# Patient Record
Sex: Male | Born: 1955 | Race: White | Hispanic: No | Marital: Single | Smoking: Never smoker
Health system: Southern US, Community
[De-identification: ages and names within clinical notes are randomized; demographics above are authoritative.]

## PROBLEM LIST (undated history)

## (undated) DIAGNOSIS — N200 Calculus of kidney: Secondary | ICD-10-CM

## (undated) DIAGNOSIS — I10 Essential (primary) hypertension: Secondary | ICD-10-CM

## (undated) DIAGNOSIS — I712 Thoracic aortic aneurysm, without rupture: Secondary | ICD-10-CM

## (undated) DIAGNOSIS — R0600 Dyspnea, unspecified: Secondary | ICD-10-CM

## (undated) DIAGNOSIS — Z87442 Personal history of urinary calculi: Secondary | ICD-10-CM

## (undated) DIAGNOSIS — E785 Hyperlipidemia, unspecified: Secondary | ICD-10-CM

## (undated) DIAGNOSIS — I2510 Atherosclerotic heart disease of native coronary artery without angina pectoris: Secondary | ICD-10-CM

## (undated) DIAGNOSIS — G4730 Sleep apnea, unspecified: Secondary | ICD-10-CM

## (undated) DIAGNOSIS — R931 Abnormal findings on diagnostic imaging of heart and coronary circulation: Secondary | ICD-10-CM

## (undated) DIAGNOSIS — Z8719 Personal history of other diseases of the digestive system: Secondary | ICD-10-CM

## (undated) DIAGNOSIS — R001 Bradycardia, unspecified: Secondary | ICD-10-CM

## (undated) DIAGNOSIS — I7121 Aneurysm of the ascending aorta, without rupture: Secondary | ICD-10-CM

## (undated) HISTORY — DX: Bradycardia, unspecified: R00.1

## (undated) HISTORY — DX: Calculus of kidney: N20.0

## (undated) HISTORY — PX: PROSTATE BIOPSY: SHX241

## (undated) HISTORY — PX: UPPER GASTROINTESTINAL ENDOSCOPY: SHX188

## (undated) HISTORY — PX: COLONOSCOPY: SHX174

## (undated) HISTORY — PX: LAMINECTOMY: SHX219

## (undated) HISTORY — DX: Hyperlipidemia, unspecified: E78.5

## (undated) HISTORY — DX: Abnormal findings on diagnostic imaging of heart and coronary circulation: R93.1

## (undated) HISTORY — DX: Essential (primary) hypertension: I10

## (undated) HISTORY — PX: MOLE REMOVAL: SHX2046

## (undated) HISTORY — PX: TONSILLECTOMY AND ADENOIDECTOMY: SUR1326

## (undated) HISTORY — DX: Atherosclerotic heart disease of native coronary artery without angina pectoris: I25.10

---

## 2018-02-09 LAB — HM COLONOSCOPY

## 2018-05-14 DIAGNOSIS — E785 Hyperlipidemia, unspecified: Secondary | ICD-10-CM

## 2018-05-14 DIAGNOSIS — I10 Essential (primary) hypertension: Secondary | ICD-10-CM

## 2018-05-14 DIAGNOSIS — R001 Bradycardia, unspecified: Secondary | ICD-10-CM | POA: Insufficient documentation

## 2018-05-14 DIAGNOSIS — I119 Hypertensive heart disease without heart failure: Secondary | ICD-10-CM

## 2018-05-14 HISTORY — DX: Essential (primary) hypertension: I10

## 2018-05-14 HISTORY — DX: Hypertensive heart disease without heart failure: I11.9

## 2018-05-14 HISTORY — DX: Hyperlipidemia, unspecified: E78.5

## 2018-05-14 HISTORY — DX: Bradycardia, unspecified: R00.1

## 2018-05-19 ENCOUNTER — Ambulatory Visit: Payer: 59 | Admitting: Cardiology

## 2018-05-30 NOTE — Progress Notes (Signed)
Cardiology Office Note:    Date:  06/01/2018   ID:  Joe Arroyo, DOB 1956/03/07, MRN [id number]  PCP:  [name], MD  Cardiologist:  Shirlee More, MD   Referring MD: [name], MD  ASSESSMENT:    1. Essential hypertension   2. Mixed hyperlipidemia    PLAN:    In order of problems listed above:  1. His blood pressure is remaining above 269-485 systolic I am concerned with high dose Catapres as monotherapy after review of office records or allergies I will place him on an ARB telmisartan 20 mg daily continue to monitor home blood pressures follow-up in the office 3 to 4 weeks for recheck renal function and hopefully be able to down titrate his centrally active Catapres which long-term high dose can cause CNS side effects.  He does sodium restriction I have encouraged him to achieve moderate weight loss BMI greater than 25.  If diuretic is needed we could use distal diuretic spironolactone or Inspra after rechecking renal function 2. Stable continue Zetia  Next appointment   Medication Adjustments/Labs and Tests Ordered: Current medicines are reviewed at length with the patient today.  Concerns regarding medicines are outlined above.  Orders Placed This Encounter  Procedures  . EKG 12-Lead   Meds ordered this encounter  Medications  . telmisartan (MICARDIS) 20 MG tablet    Sig: Take 1 tablet (20 mg total) by mouth daily.    Dispense:  30 tablet    Refill:  3     No chief complaint on file.   History of Present Illness:    Joe Arroyo is a 63 y.o. male who is being seen today for the evaluation of poorly controlled hypertension at the request of Cox, Kirsten, MD.  He has been hypertension for about 4 years and presently is taking fairly high-dose Catapres as monotherapy for control despite this he often is greater than 462 systolic.  Fortunately he is not having CNS side effects.  He has sinus bradycardia and beta-blockers were ineffective or poorly tolerated had a  rash from thiazide diuretic.  I accessed his records and after discussion decided to place him on high intensity ARB reassess in the office and hopefully be able to reduce the dose of his clonidine and take it as a nighttime agent.  He does sodium restrict asked him to continue the same continue monitoring his BP at home encouraged weight loss which helps with blood pressure control.  His BMP is in the overweight category.  I discussed with him evaluation for secondary sources of hypertension told he had a CT scan access to from [facility] and he had no evidence of renal artery stenosis.  Recent labs at his PCP office show normal renal function and CBC Past Medical History:  Diagnosis Date  . Hyperlipidemia 05/14/2018  . Hypertension 05/14/2018  . Sinus bradycardia 05/14/2018    Past Surgical History:  Procedure Laterality Date  . LAMINECTOMY    . MOLE REMOVAL    . TONSILLECTOMY AND ADENOIDECTOMY      Current Medications: Current Meds  Medication Sig  . cloNIDine (CATAPRES) 0.3 MG tablet Take 0.3 mg by mouth 2 (two) times daily.  [name] esomeprazole (NEXIUM) 20 MG capsule Take 20 mg by mouth daily at 12 noon.  . ezetimibe (ZETIA) 10 MG tablet Take 10 mg by mouth daily.  . finasteride (PROSCAR) 5 MG tablet Take 1 tablet by mouth daily.  . Multiple Vitamins-Minerals (CENTRUM SILVER ULTRA MENS PO) Take 1  tablet by mouth daily.  . Omega-3 Fatty Acids (FISH OIL PO) Take 1 tablet by mouth 2 (two) times daily.     Allergies:   Bystolic [nebivolol hcl]; Carvedilol; Pravastatin; Hctz [hydrochlorothiazide]; Hydralazine; and Penicillins   Social History   Socioeconomic History  . Marital status: Single    Spouse name: Not on file  . Number of children: Not on file  . Years of education: Not on file  . Highest education level: Not on file  Occupational History  . Not on file  Social Needs  . Financial resource strain: Not on file  . Food insecurity:    Worry: Not on file    Inability:  Not on file  . Transportation needs:    Medical: Not on file    Non-medical: Not on file  Tobacco Use  . Smoking status: Never Smoker  . Smokeless tobacco: Never Used  Substance and Sexual Activity  . Alcohol use: Yes    Comment: occ  . Drug use: Never  . Sexual activity: Not on file  Lifestyle  . Physical activity:    Days per week: Not on file    Minutes per session: Not on file  . Stress: Not on file  Relationships  . Social connections:    Talks on phone: Not on file    Gets together: Not on file    Attends religious service: Not on file    Active member of club or organization: Not on file    Attends meetings of clubs or organizations: Not on file    Relationship status: Not on file  Other Topics Concern  . Not on file  Social History Narrative  . Not on file     Family History: The patient's family history includes Aortic stenosis in his mother; CAD in his maternal grandmother; Colon cancer in his mother; Congestive Heart Failure in his maternal grandmother; Diabetes Mellitus II in his father and mother; Heart attack in his maternal grandmother; Hyperlipidemia in his father and mother; Valvular heart disease in his mother.  ROS:   Review of Systems  Constitution: Negative.  HENT: Negative.   Eyes: Negative.   Cardiovascular: Negative.   Respiratory: Negative.   Endocrine: Negative.   Hematologic/Lymphatic: Negative.   Skin: Negative.   Musculoskeletal: Negative.   Gastrointestinal: Negative.   Genitourinary: Negative.   Neurological: Negative.   Psychiatric/Behavioral: Negative.   Allergic/Immunologic: Negative.    Please see the history of present illness.     All other systems reviewed and are negative.  EKGs/Labs/Other Studies Reviewed:    The following studies were reviewed today:  EKG:  EKG is  ordered today.  The ekg ordered today demonstrates Atrial bigeminy OW normal  Recent Labs: No results found for requested labs within last 8760 hours.    Recent Lipid Panel No results found for: CHOL, TRIG, HDL, CHOLHDL, VLDL, LDLCALC, LDLDIRECT  Physical Exam:    VS:  BP (!) 162/92 (BP Location: Right Arm, Patient Position: Sitting, Cuff Size: Normal)   Pulse 64   Ht 6' (1.829 m)   Wt 192 lb 3.2 oz (87.2 kg)   SpO2 97%   BMI 26.07 kg/m     Wt Readings from Last 3 Encounters:  06/01/18 192 lb 3.2 oz (87.2 kg)     GEN:  Well nourished, well developed in no acute distress HEENT: Normal NECK: No JVD; No carotid bruits LYMPHATICS: No lymphadenopathy CARDIAC: RRR, no murmurs, rubs, gallops RESPIRATORY:  Clear to auscultation without rales,  wheezing or rhonchi  ABDOMEN: Soft, non-tender, non-distended MUSCULOSKELETAL:  No edema; No deformity  SKIN: Warm and dry NEUROLOGIC:  Alert and oriented x 3 PSYCHIATRIC:  Normal affect     Signed, Shirlee More, MD  06/01/2018 3:57 PM    [facility]

## 2018-06-01 ENCOUNTER — Ambulatory Visit (INDEPENDENT_AMBULATORY_CARE_PROVIDER_SITE_OTHER): Payer: 59 | Admitting: Cardiology

## 2018-06-01 ENCOUNTER — Encounter: Payer: Self-pay | Admitting: Cardiology

## 2018-06-01 VITALS — BP 162/92 | HR 64 | Ht 72.0 in | Wt 192.2 lb

## 2018-06-01 DIAGNOSIS — I10 Essential (primary) hypertension: Secondary | ICD-10-CM

## 2018-06-01 DIAGNOSIS — E782 Mixed hyperlipidemia: Secondary | ICD-10-CM | POA: Diagnosis not present

## 2018-06-01 MED ORDER — TELMISARTAN 20 MG PO TABS: 20.0000 mg | ORAL_TABLET | Freq: Every day | ORAL | 3 refills | Status: DC

## 2018-06-01 NOTE — Patient Instructions (Addendum)
Medication Instructions:  Your physician has recommended you make the following change in your medication: START  Taking telmisartan (Micardis) 20 mg (1 tablet) daily  If you need a refill on your cardiac medications before your next appointment, please call your pharmacy.   Lab work: NONE  Testing/Procedures: An EKG was performed today.  Follow-Up: At [facility], you and your health needs are our priority.  As part of our continuing mission to provide you with exceptional heart care, we have created designated Provider Care Teams.  These Care Teams include your primary Cardiologist (physician) and Advanced Practice Providers (APPs -  Physician Assistants and Nurse Practitioners) who all work together to provide you with the care you need, when you need it. You will need a follow up appointment in 3 weeks.   Any Other Special Instructions Will Be Listed Below    Hypertension Hypertension, commonly called high blood pressure, is when the force of blood pumping through the arteries is too strong. The arteries are the blood vessels that carry blood from the heart throughout the body. Hypertension forces the heart to work harder to pump blood and may cause arteries to become narrow or stiff. Having untreated or uncontrolled hypertension can cause heart attacks, strokes, kidney disease, and other problems. A blood pressure reading consists of a higher number over a lower number. Ideally, your blood pressure should be below 120/80. The first ("top") number is called the systolic pressure. It is a measure of the pressure in your arteries as your heart beats. The second ("bottom") number is called the diastolic pressure. It is a measure of the pressure in your arteries as the heart relaxes. What are the causes? The cause of this condition is not known. What increases the risk? Some risk factors for high blood pressure are under your control. Others are not. Factors you can  change  Smoking.  Having type 2 diabetes mellitus, high cholesterol, or both.  Not getting enough exercise or physical activity.  Being overweight.  Having too much fat, sugar, calories, or salt (sodium) in your diet.  Drinking too much alcohol. Factors that are difficult or impossible to change  Having chronic kidney disease.  Having a family history of high blood pressure.  Age. Risk increases with age.  Race. You may be at higher risk if you are African-American.  Gender. Men are at higher risk than women before age 72. After age 77, women are at higher risk than men.  Having obstructive sleep apnea.  Stress. What are the signs or symptoms? Extremely high blood pressure (hypertensive crisis) may cause:  Headache.  Anxiety.  Shortness of breath.  Nosebleed.  Nausea and vomiting.  Severe chest pain.  Jerky movements you cannot control (seizures). How is this diagnosed? This condition is diagnosed by measuring your blood pressure while you are seated, with your arm resting on a surface. The cuff of the blood pressure monitor will be placed directly against the skin of your upper arm at the level of your heart. It should be measured at least twice using the same arm. Certain conditions can cause a difference in blood pressure between your right and left arms. Certain factors can cause blood pressure readings to be lower or higher than normal (elevated) for a short period of time:  When your blood pressure is higher when you are in a health care provider's office than when you are at home, this is called white coat hypertension. Most people with this condition do not need  medicines.  When your blood pressure is higher at home than when you are in a health care provider's office, this is called masked hypertension. Most people with this condition may need medicines to control blood pressure. If you have a high blood pressure reading during one visit or you have normal  blood pressure with other risk factors:  You may be asked to return on a different day to have your blood pressure checked again.  You may be asked to monitor your blood pressure at home for 1 week or longer. If you are diagnosed with hypertension, you may have other blood or imaging tests to help your health care provider understand your overall risk for other conditions. How is this treated? This condition is treated by making healthy lifestyle changes, such as eating healthy foods, exercising more, and reducing your alcohol intake. Your health care provider may prescribe medicine if lifestyle changes are not enough to get your blood pressure under control, and if:  Your systolic blood pressure is above 130.  Your diastolic blood pressure is above 80. Your personal target blood pressure may vary depending on your medical conditions, your age, and other factors. Follow these instructions at home: Eating and drinking   Eat a diet that is high in fiber and potassium, and low in sodium, added sugar, and fat. An example eating plan is called the DASH (Dietary Approaches to Stop Hypertension) diet. To eat this way: ? Eat plenty of fresh fruits and vegetables. Try to fill half of your plate at each meal with fruits and vegetables. ? Eat whole grains, such as whole wheat pasta, brown rice, or whole grain bread. Fill about one quarter of your plate with whole grains. ? Eat or drink low-fat dairy products, such as skim milk or low-fat yogurt. ? Avoid fatty cuts of meat, processed or cured meats, and poultry with skin. Fill about one quarter of your plate with lean proteins, such as fish, chicken without skin, beans, eggs, and tofu. ? Avoid premade and processed foods. These tend to be higher in sodium, added sugar, and fat.  Reduce your daily sodium intake. Most people with hypertension should eat less than 1,500 mg of sodium a day.  Limit alcohol intake to no more than 1 drink a day for  nonpregnant women and 2 drinks a day for men. One drink equals 12 oz of beer, 5 oz of wine, or 1 oz of hard liquor. Lifestyle   Work with your health care provider to maintain a healthy body weight or to lose weight. Ask what an ideal weight is for you.  Get at least 30 minutes of exercise that causes your heart to beat faster (aerobic exercise) most days of the week. Activities may include walking, swimming, or biking.  Include exercise to strengthen your muscles (resistance exercise), such as pilates or lifting weights, as part of your weekly exercise routine. Try to do these types of exercises for 30 minutes at least 3 days a week.  Do not use any products that contain nicotine or tobacco, such as cigarettes and e-cigarettes. If you need help quitting, ask your health care provider.  Monitor your blood pressure at home as told by your health care provider.  Keep all follow-up visits as told by your health care provider. This is important. Medicines  Take over-the-counter and prescription medicines only as told by your health care provider. Follow directions carefully. Blood pressure medicines must be taken as prescribed.  Do not skip doses of  blood pressure medicine. Doing this puts you at risk for problems and can make the medicine less effective.  Ask your health care provider about side effects or reactions to medicines that you should watch for. Contact a health care provider if:  You think you are having a reaction to a medicine you are taking.  You have headaches that keep coming back (recurring).  You feel dizzy.  You have swelling in your ankles.  You have trouble with your vision. Get help right away if:  You develop a severe headache or confusion.  You have unusual weakness or numbness.  You feel faint.  You have severe pain in your chest or abdomen.  You vomit repeatedly.  You have trouble breathing. Summary  Hypertension is when the force of blood  pumping through your arteries is too strong. If this condition is not controlled, it may put you at risk for serious complications.  Your personal target blood pressure may vary depending on your medical conditions, your age, and other factors. For most people, a normal blood pressure is less than 120/80.  Hypertension is treated with lifestyle changes, medicines, or a combination of both. Lifestyle changes include weight loss, eating a healthy, low-sodium diet, exercising more, and limiting alcohol. This information is not intended to replace advice given to you by your health care provider. Make sure you discuss any questions you have with your health care provider. Document Released: 03/31/2005 Document Revised: 02/27/2016 Document Reviewed: 02/27/2016 Elsevier Interactive Patient Education  2019 Else[employer] Inc[employer]  Telmisartan tablets What is this medicine? TELMISARTAN (tel mi SAR tan) is used to treat high blood pressure. This medicine may be used for other purposes; ask your health care provider or pharmacist if you have questions. COMMON BRAND NAME(S): Micardis What should I tell my health care provider before I take this medicine? They need to know if you have any of these conditions: -if you are on a special diet, such as a low-salt diet -kidney or liver disease -an unusual or allergic reaction to telmisartan, other medicines, foods, dyes, or preservatives -pregnant or trying to get pregnant -breast-feeding How should I use this medicine? Take this medicine by mouth with a glass of water. Follow the directions on the prescription label. This medicine can be taken with or without food. Take your doses at regular intervals. Do not take your medicine more often than directed. Talk to your pediatrician regarding the use of this medicine in children. Special care may be needed. Overdosage: If you think you have taken too much of this medicine contact a poison control center or emergency room  at once. NOTE: This medicine is only for you. Do not share this medicine with others. What if I miss a dose? If you miss a dose, take it as soon as you can. If it is almost time for your next dose, take only that dose. Do not take double or extra doses. What may interact with this medicine? -digoxin -potassium salts or potassium supplements -warfarin This list may not describe all possible interactions. Give your health care provider a list of all the medicines, herbs, non-prescription drugs, or dietary supplements you use. Also tell them if you smoke, drink alcohol, or use illegal drugs. Some items may interact with your medicine. What should I watch for while using this medicine? Visit your doctor or health care professional for regular checks on your progress. Check your blood pressure as directed. Ask your doctor or health care professional what your blood pressure  should be and when you should contact him or her. Call your doctor or health care professional if you notice an irregular or fast heart beat. Women should inform their doctor if they wish to become pregnant or think they might be pregnant. There is a potential for serious side effects to an unborn child, particularly in the second or third trimester. Talk to your health care professional or pharmacist for more information. You may get drowsy or dizzy. Do not drive, use machinery, or do anything that needs mental alertness until you know how this drug affects you. Do not stand or sit up quickly, especially if you are an older patient. This reduces the risk of dizzy or fainting spells. Alcohol can make you more drowsy and dizzy. Avoid alcoholic drinks. Avoid salt substitutes unless you are told otherwise by your doctor or health care professional. Do not treat yourself for coughs, colds, or pain while you are taking this medicine without asking your doctor or health care professional for advice. Some ingredients may increase your blood  pressure. What side effects may I notice from receiving this medicine? Side effects that you should report to your doctor or health care professional as soon as possible: -allergic reactions like skin rash, itching or hives, swelling of the face, lips, or tongue -breathing problems -dark urine -gout pain -muscle pains -slow heartbeat -trouble passing urine or change in the amount of urine -unusual bleeding or bruising -yellowing of the eyes or skin Side effects that usually do not require medical attention (report to your doctor or health care professional if they continue or are bothersome): -back pain -change in sex drive or performance -diarrhea -sore throat or stuffy nose This list may not describe all possible side effects. Call your doctor for medical advice about side effects. You may report side effects to FDA at 1-800-FDA-1088. Where should I keep my medicine? Keep out of the reach of children. Store at room temperature between 15 and 30 degrees C (59 and 86 degrees F). Tablets should not be removed from the blisters until right before use. Throw away any unused medicine after the expiration date. NOTE: This sheet is a summary. It may not cover all possible information. If you have questions about this medicine, talk to your doctor, pharmacist, or health care provider.  2019 Elsevier/Gold Standard (2007-06-16 13:39:10)

## 2018-06-03 ENCOUNTER — Other Ambulatory Visit: Payer: Self-pay

## 2018-06-03 DIAGNOSIS — R001 Bradycardia, unspecified: Secondary | ICD-10-CM

## 2018-06-24 ENCOUNTER — Ambulatory Visit: Payer: 59 | Admitting: Cardiology

## 2018-07-06 ENCOUNTER — Telehealth (INDEPENDENT_AMBULATORY_CARE_PROVIDER_SITE_OTHER): Payer: 59 | Admitting: Cardiology

## 2018-07-06 ENCOUNTER — Encounter: Payer: Self-pay | Admitting: Cardiology

## 2018-07-06 ENCOUNTER — Ambulatory Visit: Payer: 59 | Admitting: Cardiology

## 2018-07-06 ENCOUNTER — Other Ambulatory Visit: Payer: Self-pay

## 2018-07-06 VITALS — BP 142/93 | HR 68 | Ht 72.0 in | Wt 189.0 lb

## 2018-07-06 DIAGNOSIS — E782 Mixed hyperlipidemia: Secondary | ICD-10-CM

## 2018-07-06 DIAGNOSIS — I10 Essential (primary) hypertension: Secondary | ICD-10-CM | POA: Diagnosis not present

## 2018-07-06 MED ORDER — EPLERENONE 25 MG PO TABS: 25.0000 mg | ORAL_TABLET | Freq: Every day | ORAL | 1 refills | Status: DC

## 2018-07-06 NOTE — Progress Notes (Signed)
Tele-Health Visit     Evaluation Performed:  Follow-up visit  This visit type was conducted due to national recommendations for restrictions regarding the COVID-19 Pandemic (e.g. social distancing).  This format is felt to be most appropriate for this patient at this time.  All issues noted in this document were discussed and addressed.  No physical exam was performed (except for noted visual exam findings with Telehealth visits).  See MyChart message from today for the patient's consent to telehealth for [facility].  Date:  07/06/2018   ID:  Joe Arroyo, DOB Feb 10, 1956, MRN [id number]  Patient Location:  [address] [place] [address]   Provider location:   [place]  PCP:  [name], MD  Cardiologist:  No primary care provider on file. Dr Bettina Gavia Electrophysiologist:  None   Chief Complaint: Blood pressure control change in medication  History of Present Illness:    Joe Arroyo is a 63 y.o. male who presents via audio/video conferencing for a telehealth visit today.  He was last seen in the office last month with poorly controlled hypertension was started on ARB.  Blood pressures remained elevated his PCP doubled the dose and he continues bedtime Catapres.  Home blood pressures are improved but diastolics are often elevated at 57-846 systolics mostly clustered less than 140.  He has had no rash cough lightheadedness chest pain palpitation or syncope tolerates his medications and is very compliant with sodium restriction and home blood pressure measurement.  He has had no recent lab work performed since the last visit.  He has multiple drug intolerances and allergies and to optimize blood pressure control will start a diuretic he is intolerant of thiazide with allergy will place him on low-dose MRA Inspra 25 mg daily and make arrangements for outpatient BMP in 1 week.  Continue to monitor home blood pressures and I will see back in a tele-visit in 6 weeks.   Hyperlipidemia stable continue his current treatment  The patient does not symptoms concerning for COVID-19 infection (fever, chills, cough, or new SHORTNESS OF BREATH).    Prior CV studies:   The following studies were reviewed today:  Previous office notes reviewed he had normal renal function.  Past Medical History:  Diagnosis Date  . Hyperlipidemia 05/14/2018  . Hypertension 05/14/2018  . Sinus bradycardia 05/14/2018   Past Surgical History:  Procedure Laterality Date  . LAMINECTOMY    . MOLE REMOVAL    . TONSILLECTOMY AND ADENOIDECTOMY       Current Meds  Medication Sig  . cloNIDine (CATAPRES) 0.3 MG tablet Take 0.3 mg by mouth daily.   [name] esomeprazole (NEXIUM) 20 MG capsule Take 20 mg by mouth daily at 12 noon.  . ezetimibe (ZETIA) 10 MG tablet Take 10 mg by mouth daily.  . finasteride (PROSCAR) 5 MG tablet Take 1 tablet by mouth daily.  . Multiple Vitamins-Minerals (CENTRUM SILVER ULTRA MENS PO) Take 1 tablet by mouth daily.  . Omega-3 Fatty Acids (FISH OIL PO) Take 1 tablet by mouth 2 (two) times daily.  [name] telmisartan (MICARDIS) 20 MG tablet Take 40 mg by mouth daily.     Allergies:   Bystolic [nebivolol hcl]; Carvedilol; Pravastatin; Hctz [hydrochlorothiazide]; Hydralazine; and Penicillins   Social History   Tobacco Use  . Smoking status: Never Smoker  . Smokeless tobacco: Never Used  Substance Use Topics  . Alcohol use: Yes    Comment: occ  . Drug use: Never     Family  Hx: The patient's family history includes Aortic stenosis in his mother; CAD in his maternal grandmother; Colon cancer in his mother; Congestive Heart Failure in his maternal grandmother; Diabetes Mellitus II in his father and mother; Heart attack in his maternal grandmother; Hyperlipidemia in his father and mother; Valvular heart disease in his mother.  ROS:   Please see the history of present illness.     All other systems reviewed and are negative.   Labs/Other Tests and Data Reviewed:     Recent Labs: No results found for requested labs within last 8760 hours.   Recent Lipid Panel No results found for: CHOL, TRIG, HDL, CHOLHDL, LDLCALC, LDLDIRECT  Wt Readings from Last 3 Encounters:  07/06/18 189 lb (85.7 kg)  06/01/18 192 lb 3.2 oz (87.2 kg)     Exam:    Vital Signs:  BP (!) 142/93 (BP Location: Left Arm, Patient Position: Sitting, Cuff Size: Normal)   Pulse 68   Ht 6' (1.829 m)   Wt 189 lb (85.7 kg)   BMI 25.63 kg/m   There was no video link for this visit  ASSESSMENT & PLAN:    1.  Hypertension essential improved but still above target continue bedtime clonidine morning ARB and start distal diuretic home blood pressure checks and follow-up BMP 1 week if continue sodium restriction home blood pressure monitoring contact us if systolics do not achieve target or if he is symptomatic hypotension. 2.  Hyperlipidemia stable continue current treatment Zetia fish oil managed by his PCP  COVID-19 Education: The signs and symptoms of COVID-19 were discussed with the patient and how to seek care for testing (follow up with PCP or arrange E-visit).  The importance of social distancing was discussed today.  Patient Risk:   After full review of this patients clinical status, I feel that they are at least moderate risk at this time.  Time:   Today, I have spent 17 minutes with the patient with telehealth technology discussing patient's hypertension current treatment drug intolerances and crafting a plan to optimize blood pressure control and follow-up recheck renal function to be sure he does not develop hyperkalemia with a combination MRA and ARB.     Medication Adjustments/Labs and Tests Ordered: Current medicines are reviewed at length with the patient today.  Concerns regarding medicines are outlined above.  Tests Ordered: Orders Placed This Encounter  Procedures  . Basic metabolic panel   Medication Changes: Meds ordered this encounter  Medications  .  eplerenone (INSPRA) 25 MG tablet    Sig: Take 1 tablet (25 mg total) by mouth daily.    Dispense:  90 tablet    Refill:  1    Disposition:  in 6 week(s)  Signed, Shirlee More, MD  07/06/2018 9:56 AM    [facility] [place], [place], [place]  [address] Phone: [phone]; Fax: [phone]

## 2018-07-06 NOTE — Patient Instructions (Signed)
Medication Instructions:  Your physician has recommended you make the following change in your medication: .  START:  Inspra 25 mg one tablet daily  If you need a refill on your cardiac medications before your next appointment, please call your pharmacy.   Lab work: Your physician recommends that you return for lab work in 1 week: BMP in [place] office.   If you have labs (blood work) drawn today and your tests are completely normal, you will receive your results only by: [name] MyChart Message (if you have MyChart) OR . A paper copy in the mail If you have any lab test that is abnormal or we need to change your treatment, we will call you to review the results.  Testing/Procedures: NONE  Follow-Up: At [facility], you and your health needs are our priority.  As part of our continuing mission to provide you with exceptional heart care, we have created designated Provider Care Teams.  These Care Teams include your primary Cardiologist (physician) and Advanced Practice Providers (APPs -  Physician Assistants and Nurse Practitioners) who all work together to provide you with the care you need, when you need it. You will need a follow up appointment in 6 weeks as a televisit.

## 2018-07-16 ENCOUNTER — Other Ambulatory Visit: Payer: Self-pay | Admitting: Cardiology

## 2018-07-16 NOTE — Telephone Encounter (Signed)
Spoke with pt to check on him about medication. He said Dr. Tobie Poet had put him on the 40 mg of Telmisartan and Dr. Bettina Gavia is okay with that. Pt states is doing well and has virtual visit in May with Dr. Bettina Gavia

## 2018-08-16 NOTE — Progress Notes (Signed)
Virtual Visit via Video Note   This visit type was conducted due to national recommendations for restrictions regarding the COVID-19 Pandemic (e.g. social distancing) in an effort to limit this patient's exposure and mitigate transmission in our community.  Due to his co-morbid illnesses, this patient is at least at moderate risk for complications without adequate follow up.  This format is felt to be most appropriate for this patient at this time.  All issues noted in this document were discussed and addressed.  A limited physical exam was performed with this format.  Please refer to the patient's chart for his consent to telehealth for [facility].   Date:  08/16/2018   ID:  Joe Arroyo, DOB 06-05-1955, MRN [id number]  Patient Location: Home Provider Location: Office  PCP:  [name], MD  Cardiologist:  No primary care provider on file. 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  The patient does not have symptoms concerning for COVID-19 infection (fever, chills, cough, or new shortness of breath).    Past Medical History:  Diagnosis Date  . Hyperlipidemia 05/14/2018  . Hypertension 05/14/2018  . Sinus bradycardia 05/14/2018   Past Surgical History:  Procedure Laterality Date  . LAMINECTOMY    . MOLE REMOVAL    . TONSILLECTOMY AND ADENOIDECTOMY       No outpatient medications have been marked as taking for the 08/17/18 encounter (Appointment) with Richardo Priest, MD.     Allergies:   Bystolic [nebivolol hcl]; Carvedilol; Pravastatin; Hctz [hydrochlorothiazide]; Hydralazine; and Penicillins   Social History   Tobacco Use  . Smoking status: Never Smoker  . Smokeless tobacco: Never Used  Substance Use Topics  . Alcohol use: Yes    Comment: occ  . Drug use: Never     Family Hx: The  patient's family history includes Aortic stenosis in his mother; CAD in his maternal grandmother; Colon cancer in his mother; Congestive Heart Failure in his maternal grandmother; Diabetes Mellitus II in his father and mother; Heart attack in his maternal grandmother; Hyperlipidemia in his father and mother; Valvular heart disease in his mother.  ROS:   Please see the history of present illness.     All other systems reviewed and are negative.   Prior CV studies:   The following studies were reviewed today:    Labs/Other Tests and Data Reviewed:    EKG:  No ECG reviewed.  Recent Labs: No results found for requested labs within last 8760 hours.   Recent Lipid Panel No results found for: CHOL, TRIG, HDL, CHOLHDL, LDLCALC, LDLDIRECT  Wt Readings from Last 3 Encounters:  07/06/18 189 lb (85.7 kg)  06/01/18 192 lb 3.2 oz (87.2 kg)     Objective:    Vital Signs:  There were no vitals taken for this visit.   VITAL SIGNS:  reviewed GEN:  no acute distress EYES:  sclerae anicteric, EOMI - Extraocular Movements Intact RESPIRATORY:  normal respiratory effort, symmetric expansion CARDIOVASCULAR:  no peripheral edema SKIN:  no rash, lesions or ulcers. MUSCULOSKELETAL:  no obvious deformities. NEURO:  alert and oriented x 3, no obvious focal deficit PSYCH:  normal affect  ASSESSMENT & PLAN:    1. Hypertension resistant, he is at a very nice response to the initiation of distal diuretic home blood pressures in the  morning run 1 59-5 30 systolic in the evening from 1 63-8 40 systolic.  He is compliant with medications restrict sodium and has good technique for measuring home blood pressure.  I would continue his current medical regimen including centrally active clonidine at bedtime distal diuretic Inspra and a high potency ARB telmisartan.  I will plan to see in the office in 3 months he will contact me if his blood pressures are out of range.  Recent labs done by his primary care  physician 06/22/2018 showed GFR 64 cc creatinine 1.19 potassium 4.6 lipid profile has a cholesterol of 242 HDL 63 LDL 153. 2. Hypertension continue current treatment with Zetia he has not tolerated statins in the absence of vascular disease I would not initiate PCSK9  COVID-19 Education: The signs and symptoms of COVID-19 were discussed with the patient and how to seek care for testing (follow up with PCP or arrange E-visit).  The importance of social distancing was discussed today.  Time:   Today, I have spent 25 minutes with the patient with telehealth technology discussing the above problems.     Medication Adjustments/Labs and Tests Ordered: Current medicines are reviewed at length with the patient today.  Concerns regarding medicines are outlined above.   Tests Ordered: No orders of the defined types were placed in this encounter.   Medication Changes: No orders of the defined types were placed in this encounter.   Disposition:  Follow up in 3 month(s)  Signed, Shirlee More, MD  08/16/2018 12:36 PM    [place]

## 2018-08-17 ENCOUNTER — Telehealth (INDEPENDENT_AMBULATORY_CARE_PROVIDER_SITE_OTHER): Payer: 59 | Admitting: Cardiology

## 2018-08-17 ENCOUNTER — Telehealth: Payer: Self-pay | Admitting: Cardiology

## 2018-08-17 ENCOUNTER — Other Ambulatory Visit: Payer: Self-pay

## 2018-08-17 ENCOUNTER — Encounter: Payer: Self-pay | Admitting: Cardiology

## 2018-08-17 VITALS — BP 113/73 | HR 68 | Ht 72.0 in | Wt 187.0 lb

## 2018-08-17 DIAGNOSIS — I10 Essential (primary) hypertension: Secondary | ICD-10-CM

## 2018-08-17 DIAGNOSIS — Z7189 Other specified counseling: Secondary | ICD-10-CM

## 2018-08-17 DIAGNOSIS — E782 Mixed hyperlipidemia: Secondary | ICD-10-CM

## 2018-08-17 MED ORDER — EPLERENONE 25 MG PO TABS: 25.0000 mg | ORAL_TABLET | Freq: Every day | ORAL | 1 refills | Status: DC

## 2018-08-17 MED ORDER — CLONIDINE HCL 0.3 MG PO TABS: 0.3000 mg | ORAL_TABLET | Freq: Every day | ORAL | 1 refills | Status: DC

## 2018-08-17 NOTE — Telephone Encounter (Signed)
Virtual Visit Pre-Appointment Phone Call  "(Name), I am calling you today to discuss your upcoming appointment. We are currently trying to limit exposure to the virus that causes COVID-19 by seeing patients at home rather than in the office."  1. "What is the BEST phone number to call the day of the visit?" - include this in appointment notes  2. Do you have or have access to (through a family member/friend) a smartphone with video capability that we can use for your visit?" a. If yes - list this number in appt notes as cell (if different from BEST phone #) and list the appointment type as a VIDEO visit in appointment notes b. If no - list the appointment type as a PHONE visit in appointment notes  3. Confirm consent - "In the setting of the current Covid19 crisis, you are scheduled for a (phone or video) visit with your provider on (date) at (time).  Just as we do with many in-office visits, in order for you to participate in this visit, we must obtain consent.  If you'd like, I can send this to your mychart (if signed up) or email for you to review.  Otherwise, I can obtain your verbal consent now.  All virtual visits are billed to your insurance company just like a normal visit would be.  By agreeing to a virtual visit, we'd like you to understand that the technology does not allow for your provider to perform an examination, and thus may limit your provider's ability to fully assess your condition. If your provider identifies any concerns that need to be evaluated in person, we will make arrangements to do so.  Finally, though the technology is pretty good, we cannot assure that it will always work on either your or our end, and in the setting of a video visit, we may have to convert it to a phone-only visit.  In either situation, we cannot ensure that we have a secure connection.  Are you willing to proceed?" STAFF: Did the patient verbally acknowledge consent to telehealth visit? Document  YES/NO here: Yes  4. Advise patient to be prepared - "Two hours prior to your appointment, go ahead and check your blood pressure, pulse, oxygen saturation, and your weight (if you have the equipment to check those) and write them all down. When your visit starts, your provider will ask you for this information. If you have an Apple Watch or Kardia device, please plan to have heart rate information ready on the day of your appointment. Please have a pen and paper handy nearby the day of the visit as well."  5. Give patient instructions for MyChart download to smartphone OR Doximity/Doxy.me as below if video visit (depending on what platform provider is using)  6. Inform patient they will receive a phone call 15 minutes prior to their appointment time (may be from unknown caller ID) so they should be prepared to answer    TELEPHONE CALL NOTE  Joe Arroyo has been deemed a candidate for a follow-up tele-health visit to limit community exposure during the Covid-19 pandemic. I spoke with the patient via phone to ensure availability of phone/video source, confirm preferred email & phone number, and discuss instructions and expectations.  I reminded Joe Arroyo to be prepared with any vital sign and/or heart rhythm information that could potentially be obtained via home monitoring, at the time of his visit. I reminded Joe Arroyo to expect a phone call prior to his visit.  Calla Kicks 08/17/2018 8:31 AM   INSTRUCTIONS FOR DOWNLOADING THE MYCHART APP TO SMARTPHONE  - The patient must first make sure to have activated MyChart and know their login information - If Apple, go to [employer] and type in MyChart in the search bar and download the app. If Android, ask patient to go to [employer] and type in [place] in the search bar and download the app. The app is free but as with any other app downloads, their phone may require them to verify saved payment information or Apple/Android password.   - The patient will need to then log into the app with their MyChart username and password, and select [facility] as their healthcare provider to link the account. When it is time for your visit, go to the MyChart app, find appointments, and click Begin Video Visit. Be sure to Select Allow for your device to access the Microphone and Camera for your visit. You will then be connected, and your provider will be with you shortly.  **If they have any issues connecting, or need assistance please contact MyChart service desk [phone])**  **If using a computer, in order to ensure the best quality for their visit they will need to use either of the following Internet Browsers: [employer], or Google Chrome**  IF USING DOXIMITY or DOXY.ME - The patient will receive a link just prior to their visit by text.     FULL LENGTH CONSENT FOR TELE-HEALTH VISIT   I hereby voluntarily request, consent and authorize [facility] and its employed or contracted physicians, physician assistants, nurse practitioners or other licensed health care professionals (the Practitioner), to provide me with telemedicine health care services (the Services") as deemed necessary by the treating Practitioner. I acknowledge and consent to receive the Services by the Practitioner via telemedicine. I understand that the telemedicine visit will involve communicating with the Practitioner through live audiovisual communication technology and the disclosure of certain medical information by electronic transmission. I acknowledge that I have been given the opportunity to request an in-person assessment or other available alternative prior to the telemedicine visit and am voluntarily participating in the telemedicine visit.  I understand that I have the right to withhold or withdraw my consent to the use of telemedicine in the course of my care at any time, without affecting my right to future care or treatment, and that  the Practitioner or I may terminate the telemedicine visit at any time. I understand that I have the right to inspect all information obtained and/or recorded in the course of the telemedicine visit and may receive copies of available information for a reasonable fee.  I understand that some of the potential risks of receiving the Services via telemedicine include:   Delay or interruption in medical evaluation due to technological equipment failure or disruption;  Information transmitted may not be sufficient (e.g. poor resolution of images) to allow for appropriate medical decision making by the Practitioner; and/or   In rare instances, security protocols could fail, causing a breach of personal health information.  Furthermore, I acknowledge that it is my responsibility to provide information about my medical history, conditions and care that is complete and accurate to the best of my ability. I acknowledge that Practitioner's advice, recommendations, and/or decision may be based on factors not within their control, such as incomplete or inaccurate data provided by me or distortions of diagnostic images or specimens that may result from electronic transmissions. I understand that the  practice of medicine is not an Chief Strategy Officer and that Practitioner makes no warranties or guarantees regarding treatment outcomes. I acknowledge that I will receive a copy of this consent concurrently upon execution via email to the email address I last provided but may also request a printed copy by calling the office of [place].    I understand that my insurance will be billed for this visit.   I have read or had this consent read to me.  I understand the contents of this consent, which adequately explains the benefits and risks of the Services being provided via telemedicine.   I have been provided ample opportunity to ask questions regarding this consent and the Services and have had my questions answered to  my satisfaction.  I give my informed consent for the services to be provided through the use of telemedicine in my medical care  By participating in this telemedicine visit I agree to the above.

## 2018-08-17 NOTE — Patient Instructions (Addendum)
Medication Instructions:  Your physician recommends that you continue on your current medications as directed. Please refer to the Current Medication list given to you today.  If you need a refill on your cardiac medications before your next appointment, please call your pharmacy.   Lab work: None If you have labs (blood work) drawn today and your tests are completely normal, you will receive your results only by: [name] MyChart Message (if you have MyChart) OR . A paper copy in the mail If you have any lab test that is abnormal or we need to change your treatment, we will call you to review the results.  Testing/Procedures: None  Follow-Up: At [facility], you and your health needs are our priority.  As part of our continuing mission to provide you with exceptional heart care, we have created designated Provider Care Teams.  These Care Teams include your primary Cardiologist (physician) and Advanced Practice Providers (APPs -  Physician Assistants and Nurse Practitioners) who all work together to provide you with the care you need, when you need it. You will need a follow up appointment in 2 months.  Any Other Special Instructions Will Be Listed Below (If Applicable).    [facility]  Tips to measure your blood pressure correctly  To determine whether you have hypertension, a medical professional will take a blood pressure reading. How you prepare for the test, the position of your arm, and other factors can change a blood pressure reading by 10% or more. That could be enough to hide high blood pressure, start you on a drug you don't really need, or lead your doctor to incorrectly adjust your medications. National and international guidelines offer specific instructions for measuring blood pressure. If a doctor, nurse, or medical assistant isn't doing it right, don't hesitate to ask him or her to get with the guidelines. Here's  what you can do to ensure a correct reading: . Don't drink a caffeinated beverage or smoke during the 30 minutes before the test. . Sit quietly for five minutes before the test begins. . During the measurement, sit in a chair with your feet on the floor and your arm supported so your elbow is at about heart level. . The inflatable part of the cuff should completely cover at least 80% of your upper arm, and the cuff should be placed on bare skin, not over a shirt. . Don't talk during the measurement. . Have your blood pressure measured twice, with a brief break in between. If the readings are different by 5 points or more, have it done a third time. There are times to break these rules. If you sometimes feel lightheaded when getting out of bed in the morning or when you stand after sitting, you should have your blood pressure checked while seated and then while standing to see if it falls from one position to the next. Because blood pressure varies throughout the day, your doctor will rarely diagnose hypertension on the basis of a single reading. Instead, he or she will want to confirm the measurements on at least two occasions, usually within a few weeks of one another. The exception to this rule is if you have a blood pressure reading of 180/110 mm Hg or higher. A result this high usually calls for prompt treatment. It's also a good  idea to have your blood pressure measured in both arms at least once, since the reading in one arm (usually the right) may be higher than that in the left. A 2014 study in The American Journal of Medicine of nearly 3,400 people found average arm- to-arm differences in systolic blood pressure of about 5 points. The higher number should be used to make treatment decisions. In 2017, new guidelines from the [place], the [employer] of Cardiology, and nine other health organizations lowered the diagnosis of high blood pressure to 130/80 mm Hg or higher for  all adults. The guidelines also redefined the various blood pressure categories to now include normal, elevated, Stage 1 hypertension, Stage 2 hypertension, and hypertensive crisis (see "Blood pressure categories"). Blood pressure categories  Blood pressure category SYSTOLIC (upper number)  DIASTOLIC (lower number)  Normal Less than 120 mm Hg and Less than 80 mm Hg  Elevated 120-129 mm Hg and Less than 80 mm Hg  High blood pressure: Stage 1 hypertension 130-139 mm Hg or 80-89 mm Hg  High blood pressure: Stage 2 hypertension 140 mm Hg or higher or 90 mm Hg or higher  Hypertensive crisis (consult your doctor immediately) Higher than 180 mm Hg and/or Higher than 120 mm Hg  Source: American Heart Association and American Stroke Association. For more on getting your blood pressure under control, buy Controlling Your Blood Pressure, a Special Health Report from [facility].

## 2018-10-19 ENCOUNTER — Telehealth: Payer: 59 | Admitting: Cardiology

## 2018-10-24 NOTE — Progress Notes (Deleted)
Cardiology Office Note:    Date:  10/24/2018   ID:  Jago Carton, DOB Dec 10, 1955, MRN [id number]  PCP:  [name], MD  Cardiologist:  Shirlee More, MD    Referring MD: [name], MD    ASSESSMENT:    No diagnosis found. PLAN:    In order of problems listed above:  1. ***   Next appointment: ***   Medication Adjustments/Labs and Tests Ordered: Current medicines are reviewed at length with the patient today.  Concerns regarding medicines are outlined above.  No orders of the defined types were placed in this encounter.  No orders of the defined types were placed in this encounter.   No chief complaint on file.   History of Present Illness:    Rigoberto Repass is a 63 y.o. male with a hx of resistant hypertension last seen 08/17/2018.  At that time his blood pressure was at target tolerating and compliant with medications including distal diuretic high potency ARB and clonidine.. Compliance with diet, lifestyle and medications: *** Past Medical History:  Diagnosis Date  . Hyperlipidemia 05/14/2018  . Hypertension 05/14/2018  . Sinus bradycardia 05/14/2018    Past Surgical History:  Procedure Laterality Date  . LAMINECTOMY    . MOLE REMOVAL    . TONSILLECTOMY AND ADENOIDECTOMY      Current Medications: No outpatient medications have been marked as taking for the 10/26/18 encounter (Appointment) with Richardo Priest, MD.     Allergies:   Bystolic [nebivolol hcl], Carvedilol, Pravastatin, Hctz [hydrochlorothiazide], Hydralazine, and Penicillins   Social History   Socioeconomic History  . Marital status: Single    Spouse name: Not on file  . Number of children: Not on file  . Years of education: Not on file  . Highest education level: Not on file  Occupational History  . Not on file  Social Needs  . Financial resource strain: Not on file  . Food insecurity    Worry: Not on file    Inability: Not on file  . Transportation needs    Medical: Not on file     Non-medical: Not on file  Tobacco Use  . Smoking status: Never Smoker  . Smokeless tobacco: Never Used  Substance and Sexual Activity  . Alcohol use: Yes    Comment: occ  . Drug use: Never  . Sexual activity: Not on file  Lifestyle  . Physical activity    Days per week: Not on file    Minutes per session: Not on file  . Stress: Not on file  Relationships  . Social Herbalist on phone: Not on file    Gets together: Not on file    Attends religious service: Not on file    Active member of club or organization: Not on file    Attends meetings of clubs or organizations: Not on file    Relationship status: Not on file  Other Topics Concern  . Not on file  Social History Narrative  . Not on file     Family History: The patient's ***family history includes Aortic stenosis in his mother; CAD in his maternal grandmother; Colon cancer in his mother; Congestive Heart Failure in his maternal grandmother; Diabetes Mellitus II in his father and mother; Heart attack in his maternal grandmother; Hyperlipidemia in his father and mother; Valvular heart disease in his mother. ROS:   Please see the history of present illness.    All other systems reviewed and are negative.  EKGs/Labs/Other  Studies Reviewed:    The following studies were reviewed today:  EKG:  EKG ordered today and personally reviewed.  The ekg ordered today demonstrates ***  Recent Labs: No results found for requested labs within last 8760 hours.  Recent Lipid Panel No results found for: CHOL, TRIG, HDL, CHOLHDL, VLDL, LDLCALC, LDLDIRECT  Physical Exam:    VS:  There were no vitals taken for this visit.    Wt Readings from Last 3 Encounters:  08/17/18 187 lb (84.8 kg)  07/06/18 189 lb (85.7 kg)  06/01/18 192 lb 3.2 oz (87.2 kg)     GEN: *** Well nourished, well developed in no acute distress HEENT: Normal NECK: No JVD; No carotid bruits LYMPHATICS: No lymphadenopathy CARDIAC: ***RRR, no murmurs,  rubs, gallops RESPIRATORY:  Clear to auscultation without rales, wheezing or rhonchi  ABDOMEN: Soft, non-tender, non-distended MUSCULOSKELETAL:  No edema; No deformity  SKIN: Warm and dry NEUROLOGIC:  Alert and oriented x 3 PSYCHIATRIC:  Normal affect    Signed, Shirlee More, MD  10/24/2018 12:57 PM    [place]

## 2018-10-26 ENCOUNTER — Telehealth: Payer: 59 | Admitting: Cardiology

## 2019-05-17 ENCOUNTER — Encounter: Payer: Self-pay | Admitting: Family Medicine

## 2019-05-17 ENCOUNTER — Ambulatory Visit (INDEPENDENT_AMBULATORY_CARE_PROVIDER_SITE_OTHER): Payer: 59 | Admitting: Family Medicine

## 2019-05-17 ENCOUNTER — Other Ambulatory Visit: Payer: Self-pay

## 2019-05-17 VITALS — BP 122/84 | HR 67 | Temp 96.3°F | Ht 72.0 in | Wt 193.0 lb

## 2019-05-17 DIAGNOSIS — E782 Mixed hyperlipidemia: Secondary | ICD-10-CM | POA: Diagnosis not present

## 2019-05-17 DIAGNOSIS — K219 Gastro-esophageal reflux disease without esophagitis: Secondary | ICD-10-CM

## 2019-05-17 DIAGNOSIS — I10 Essential (primary) hypertension: Secondary | ICD-10-CM

## 2019-05-17 NOTE — Progress Notes (Signed)
Established Patient Office Visit  Subjective:  Patient ID: Joe Arroyo, male    DOB: Jul 12, 1955  Age: 64 y.o. MRN: [id number]  CC:  Chief Complaint  Patient presents with  . Hyperlipidemia    3 month f/u  . Hypertension  . Gastroesophageal Reflux    HPI Joe Arroyo presents for Mixed hyperlipidemia  Pt presents with hyperlipidemia. Compliance with treatment has been good;  The patient is taking zetia and fish oil. The patient is compliant with medications, maintains a low cholesterol diet , follows up as directed , and maintains an exercise regimen . The patient denies experiencing any hypercholesterolemia related symptoms.   Pt presents for follow up of hypertension. The patient is tolerating the medication well without side effects.  The patient is taking telmisartan, clonidine, and eplerenone. Compliance with treatment has been good; including taking medication as directed , maintains a healthy diet and regular exercise regimen , and following up as directed. Bps 140-150/80s.  Gastroesophageal reflux disease - Patient is taking nexium. Well controlled.    Past Medical History:  Diagnosis Date  . Hyperlipidemia 05/14/2018  . Hypertension 05/14/2018  . Kidney stones   . Sinus bradycardia 05/14/2018    Past Surgical History:  Procedure Laterality Date  . LAMINECTOMY    . MOLE REMOVAL    . PROSTATE BIOPSY    . TONSILLECTOMY AND ADENOIDECTOMY      Family History  Problem Relation Age of Onset  . Hyperlipidemia Mother   . Aortic stenosis Mother   . Valvular heart disease Mother   . Colon cancer Mother   . Diabetes Mellitus II Mother   . Cancer - Colon Mother   . Cancer Mother   . Stroke Mother   . Cerebrovascular Accident Mother   . Diabetes Mother   . Hypertension Mother   . Hyperlipidemia Father   . Diabetes Mellitus II Father   . Diabetes Father   . CAD Maternal Grandmother   . Congestive Heart Failure Maternal Grandmother   . Heart attack Maternal  Grandmother   . Heart failure Maternal Grandmother   . Coronary artery disease Maternal Grandmother   . Hyperlipidemia Maternal Grandmother     Social History   Socioeconomic History  . Marital status: Single    Spouse name: Not on file  . Number of children: Not on file  . Years of education: Not on file  . Highest education level: Not on file  Occupational History  . Not on file  Tobacco Use  . Smoking status: Never Smoker  . Smokeless tobacco: Never Used  Substance and Sexual Activity  . Alcohol use: Yes    Comment: occ  . Drug use: Never  . Sexual activity: Not on file  Other Topics Concern  . Not on file  Social History Narrative  . Not on file   Social Determinants of Health   Financial Resource Strain:   . Difficulty of Paying Living Expenses: Not on file  Food Insecurity:   . Worried About Charity fundraiser in the Last Year: Not on file  . Ran Out of Food in the Last Year: Not on file  Transportation Needs:   . Lack of Transportation (Medical): Not on file  . Lack of Transportation (Non-Medical): Not on file  Physical Activity:   . Days of Exercise per Week: Not on file  . Minutes of Exercise per Session: Not on file  Stress:   . Feeling of Stress : Not on file  Social Connections:   . Frequency of Communication with Friends and Family: Not on file  . Frequency of Social Gatherings with Friends and Family: Not on file  . Attends Religious Services: Not on file  . Active Member of Clubs or Organizations: Not on file  . Attends Archivist Meetings: Not on file  . Marital Status: Not on file  Intimate Partner Violence:   . Fear of Current or Ex-Partner: Not on file  . Emotionally Abused: Not on file  . Physically Abused: Not on file  . Sexually Abused: Not on file    Outpatient Medications Prior to Visit  Medication Sig Dispense Refill  . cloNIDine (CATAPRES) 0.3 MG tablet Take 1 tablet (0.3 mg total) by mouth daily. 90 tablet 1  .  eplerenone (INSPRA) 25 MG tablet Take 1 tablet (25 mg total) by mouth daily. 90 tablet 1  . esomeprazole (NEXIUM) 20 MG capsule Take 20 mg by mouth daily at 12 noon.    . ezetimibe (ZETIA) 10 MG tablet Take 10 mg by mouth daily.    . finasteride (PROSCAR) 5 MG tablet Take 1 tablet by mouth daily.    . meloxicam (MOBIC) 15 MG tablet Take 1 tablet by mouth daily as needed.    . Multiple Vitamins-Minerals (CENTRUM SILVER ULTRA MENS PO) Take 1 tablet by mouth daily.    . Omega-3 Fatty Acids (FISH OIL PO) Take 1 tablet by mouth 2 (two) times daily.    [name] telmisartan (MICARDIS) 40 MG tablet Take 40 mg by mouth 2 (two) times daily.     No facility-administered medications prior to visit.    Allergies  Allergen Reactions  . Bystolic [Nebivolol Hcl] Other (See Comments)    bradycardia  . Carvedilol Other (See Comments)    bradycardia  . Pravastatin Other (See Comments)    "made my joints hurt"  . Hctz [Hydrochlorothiazide] Rash  . Hydralazine Rash  . Penicillins Rash    ROS Review of Systems  Constitutional: Negative for chills, fatigue and fever.  HENT: Negative for congestion, ear pain and sore throat.   Respiratory: Negative for cough and shortness of breath.   Cardiovascular: Negative for chest pain.  Gastrointestinal: Negative for abdominal pain, constipation, diarrhea, nausea and vomiting.  Endocrine: Negative for polydipsia, polyphagia and polyuria.  Genitourinary: Negative for dysuria and frequency.  Musculoskeletal: Negative for arthralgias and myalgias.  Neurological: Negative for dizziness and headaches.  Psychiatric/Behavioral: Negative for dysphoric mood.       No dysphoria        Objective:    Physical Exam  BP 122/84 (BP Location: Left Arm, Patient Position: Sitting)   Pulse 67   Temp (!) 96.3 F (35.7 C) (Tympanic)   Ht 6' (1.829 m)   Wt 193 lb (87.5 kg)   SpO2 98%   BMI 26.18 kg/m  Wt Readings from Last 3 Encounters:  05/17/19 193 lb (87.5 kg)  08/17/18  187 lb (84.8 kg)  07/06/18 189 lb (85.7 kg)     Health Maintenance Due  Topic Date Due  . Hepatitis C Screening  1955/09/03  . HIV Screening  01/03/1969  . COLONOSCOPY  01/04/2004  . INFLUENZA VACCINE  11/13/2018  . PNA vac Low Risk Adult (1 of 2 - PCV13) 01/04/2019    There are no preventive care reminders to display for this patient.  No results found for: TSH Lab Results  Component Value Date   WBC 6.3 05/17/2019   HGB 15.7 05/17/2019  HCT 45.7 05/17/2019   MCV 86 05/17/2019   PLT 260 05/17/2019   Lab Results  Component Value Date   NA 141 05/17/2019   K 4.4 05/17/2019   CO2 23 05/17/2019   GLUCOSE 87 05/17/2019   BUN 15 05/17/2019   CREATININE 1.17 05/17/2019   BILITOT 0.6 05/17/2019   ALKPHOS 159 (H) 05/17/2019   AST 30 05/17/2019   ALT 28 05/17/2019   PROT 7.2 05/17/2019   ALBUMIN 4.5 05/17/2019   CALCIUM 9.6 05/17/2019   Lab Results  Component Value Date   CHOL 246 (H) 05/17/2019   Lab Results  Component Value Date   HDL 55 05/17/2019   Lab Results  Component Value Date   LDLCALC 162 (H) 05/17/2019   Lab Results  Component Value Date   TRIG 160 (H) 05/17/2019   Lab Results  Component Value Date   CHOLHDL 4.5 05/17/2019   No results found for: HGBA1C    Assessment & Plan:   Problem List Items Addressed This Visit      Other   Hyperlipidemia - Primary   Relevant Orders   Lipid Panel (Completed)    Other Visit Diagnoses    Essential hypertension, benign       Relevant Orders   CBC with Differential (Completed)   Comprehensive metabolic panel (Completed)   GERD without esophagitis          No orders of the defined types were placed in this encounter.   Follow-up: No follow-ups on file.    [name], MD

## 2019-05-18 ENCOUNTER — Other Ambulatory Visit: Payer: Self-pay | Admitting: Family Medicine

## 2019-05-18 LAB — COMPREHENSIVE METABOLIC PANEL
ALT: 28 IU/L (ref 0–44)
AST: 30 IU/L (ref 0–40)
Albumin/Globulin Ratio: 1.7 (ref 1.2–2.2)
Albumin: 4.5 g/dL (ref 3.8–4.8)
Alkaline Phosphatase: 159 IU/L — ABNORMAL HIGH (ref 39–117)
BUN/Creatinine Ratio: 13 (ref 10–24)
BUN: 15 mg/dL (ref 8–27)
Bilirubin Total: 0.6 mg/dL (ref 0.0–1.2)
CO2: 23 mmol/L (ref 20–29)
Calcium: 9.6 mg/dL (ref 8.6–10.2)
Chloride: 101 mmol/L (ref 96–106)
Creatinine, Ser: 1.17 mg/dL (ref 0.76–1.27)
GFR calc Af Amer: 75 mL/min/{1.73_m2} (ref >59)
GFR calc non Af Amer: 65 mL/min/{1.73_m2} (ref >59)
Globulin, Total: 2.7 g/dL (ref 1.5–4.5)
Glucose: 87 mg/dL (ref 65–99)
Potassium: 4.4 mmol/L (ref 3.5–5.2)
Sodium: 141 mmol/L (ref 134–144)
Total Protein: 7.2 g/dL (ref 6.0–8.5)

## 2019-05-18 LAB — CBC WITH DIFFERENTIAL/PLATELET
Basophils Absolute: 0 10*3/uL (ref 0.0–0.2)
Basos: 1 %
EOS (ABSOLUTE): 0.1 10*3/uL (ref 0.0–0.4)
Eos: 2 %
Hematocrit: 45.7 % (ref 37.5–51.0)
Hemoglobin: 15.7 g/dL (ref 13.0–17.7)
Immature Grans (Abs): 0 10*3/uL (ref 0.0–0.1)
Immature Granulocytes: 0 %
Lymphocytes Absolute: 1.2 10*3/uL (ref 0.7–3.1)
Lymphs: 20 %
MCH: 29.5 pg (ref 26.6–33.0)
MCHC: 34.4 g/dL (ref 31.5–35.7)
MCV: 86 fL (ref 79–97)
Monocytes Absolute: 0.8 10*3/uL (ref 0.1–0.9)
Monocytes: 13 %
Neutrophils Absolute: 4.1 10*3/uL (ref 1.4–7.0)
Neutrophils: 64 %
Platelets: 260 10*3/uL (ref 150–450)
RBC: 5.32 x10E6/uL (ref 4.14–5.80)
RDW: 14.5 % (ref 11.6–15.4)
WBC: 6.3 10*3/uL (ref 3.4–10.8)

## 2019-05-18 LAB — LIPID PANEL
Chol/HDL Ratio: 4.5 ratio (ref 0.0–5.0)
Cholesterol, Total: 246 mg/dL — ABNORMAL HIGH (ref 100–199)
HDL: 55 mg/dL (ref >39)
LDL Chol Calc (NIH): 162 mg/dL — ABNORMAL HIGH (ref 0–99)
Triglycerides: 160 mg/dL — ABNORMAL HIGH (ref 0–149)
VLDL Cholesterol Cal: 29 mg/dL (ref 5–40)

## 2019-05-18 LAB — CARDIOVASCULAR RISK ASSESSMENT

## 2019-05-18 MED ORDER — AMLODIPINE BESYLATE 5 MG PO TABS: 5.0000 mg | ORAL_TABLET | Freq: Every day | ORAL | 1 refills | Status: DC

## 2019-05-26 NOTE — Patient Instructions (Signed)
Start amlodipine 5 mg once daily.  Continue to eat healthy and exercise.

## 2019-07-12 ENCOUNTER — Other Ambulatory Visit: Payer: Self-pay

## 2019-07-12 MED ORDER — TELMISARTAN 40 MG PO TABS: 40.0000 mg | ORAL_TABLET | Freq: Two times a day (BID) | ORAL | 0 refills | Status: DC

## 2019-07-19 ENCOUNTER — Other Ambulatory Visit: Payer: Self-pay

## 2019-07-19 MED ORDER — CLONIDINE HCL 0.3 MG PO TABS: 0.3000 mg | ORAL_TABLET | Freq: Every day | ORAL | 1 refills | Status: DC

## 2019-08-15 NOTE — Progress Notes (Signed)
Subjective:  Patient ID: Joe Arroyo, male    DOB: Apr 18, 1955  Age: 64 y.o. MRN: [id number]  Chief Complaint  Patient presents with  . Hypertension    HPI Pt presents for follow up of hypertension.  Date of diagnosis at the age of 30.  His current cardiac medication regimen includes CLONIDINE 0.3 MG ONCE AT NIGHT and MICARDIS 40 mg once twice a day and amlodipine 5 mg once daily. Review of his blood pressure log reveals systolics in the A999333 and diastolics in the 123XX123 range.  Compliance with treatment has been good; he takes his medication as directed, maintains his diet and exercise regimen, and follows up as directed.       Pt presents with hyperlipidemia.  Current treatment includes zetia, fenofibrate, and fish oil.  Refuses statin.Compliance with treatment has been good; he takes his medication as directed, maintains his low cholesterol diet, follows up as directed, and maintains his exercise regimen.    HEADACHES DAILY for years. Patient has headache constantly. Tries ibuprofen which helps. See ROS.    Past Medical History:  Diagnosis Date  . Hyperlipidemia 05/14/2018  . Hypertension 05/14/2018  . Kidney stones   . Sinus bradycardia 05/14/2018   Past Surgical History:  Procedure Laterality Date  . LAMINECTOMY    . MOLE REMOVAL    . PROSTATE BIOPSY    . TONSILLECTOMY AND ADENOIDECTOMY      Family History  Problem Relation Age of Onset  . Hyperlipidemia Mother   . Aortic stenosis Mother   . Valvular heart disease Mother   . Colon cancer Mother   . Diabetes Mellitus II Mother   . Cancer - Colon Mother   . Cancer Mother   . Stroke Mother   . Cerebrovascular Accident Mother   . Diabetes Mother   . Hypertension Mother   . Hyperlipidemia Father   . Diabetes Mellitus II Father   . Diabetes Father   . CAD Maternal Grandmother   . Congestive Heart Failure Maternal Grandmother   . Heart attack Maternal Grandmother   . Heart failure Maternal Grandmother   . Coronary  artery disease Maternal Grandmother   . Hyperlipidemia Maternal Grandmother    Social History   Socioeconomic History  . Marital status: Single    Spouse name: Not on file  . Number of children: Not on file  . Years of education: Not on file  . Highest education level: Not on file  Occupational History  . Not on file  Tobacco Use  . Smoking status: Never Smoker  . Smokeless tobacco: Never Used  Substance and Sexual Activity  . Alcohol use: Yes    Comment: occ  . Drug use: Never  . Sexual activity: Not on file  Other Topics Concern  . Not on file  Social History Narrative  . Not on file   Social Determinants of Health   Financial Resource Strain:   . Difficulty of Paying Living Expenses:   Food Insecurity:   . Worried About Charity fundraiser in the Last Year:   . Arboriculturist in the Last Year:   Transportation Needs:   . Film/video editor (Medical):   [name] Lack of Transportation (Non-Medical):   Physical Activity:   . Days of Exercise per Week:   . Minutes of Exercise per Session:   Stress:   . Feeling of Stress :   Social Connections:   . Frequency of Communication with Friends and Family:   .  Frequency of Social Gatherings with Friends and Family:   . Attends Religious Services:   . Active Member of Clubs or Organizations:   . Attends Archivist Meetings:   [name] Marital Status:     Review of Systems  Constitutional: Positive for fatigue. Negative for chills, diaphoresis and fever.  HENT: Positive for congestion (tried claritin and zyrtec with out improvement in headaches or congestion.). Negative for ear pain and sore throat.   Respiratory: Positive for shortness of breath (x 2-3 month with moderate - high exertion. Worse in the last 2-3 months. ). Negative for cough.   Cardiovascular: Negative for chest pain and leg swelling.  Gastrointestinal: Negative for abdominal pain, constipation, diarrhea, nausea and vomiting.  Genitourinary: Negative for  dysuria and urgency.  Musculoskeletal: Negative for arthralgias and myalgias.  Neurological: Positive for headaches (daily. Upper face behind eyes. no associated nause or vomiting. No change in headaches with positional. Eye doctor was seen 2 weeks ago. has some floaters and starting of cataracts. ). Negative for dizziness.  Psychiatric/Behavioral: Negative for dysphoric mood and sleep disturbance. The patient is not nervous/anxious.        No anhedonia.     Objective:  BP 118/80   Pulse (!) 56   Temp 98.3 F (36.8 C)   Resp 16   Ht 6' (1.829 m)   Wt 193 lb 9.6 oz (87.8 kg)   BMI 26.26 kg/m   BP/Weight 08/16/2019 A999333 [id number]  Systolic BP 123456 [id number]  Diastolic BP 80 84 73  Wt. (Lbs) 193.6 193 187  BMI 26.26 26.18 25.36    Physical Exam Vitals reviewed.  Constitutional:      Appearance: Normal appearance. He is normal weight.  HENT:     Right Ear: Tympanic membrane, ear canal and external ear normal.     Left Ear: Tympanic membrane, ear canal and external ear normal.     Nose: Congestion (left nare swollen shut. Right nare mild swelling. ) present.     Mouth/Throat:     Mouth: Mucous membranes are moist.     Pharynx: No oropharyngeal exudate or posterior oropharyngeal erythema.  Neck:     Vascular: No carotid bruit.  Cardiovascular:     Rate and Rhythm: Normal rate and regular rhythm.  Pulmonary:     Effort: Pulmonary effort is normal.     Breath sounds: Normal breath sounds.  Abdominal:     General: Abdomen is flat. Bowel sounds are normal.     Palpations: Abdomen is soft.  Neurological:     Mental Status: He is alert and oriented to person, place, and time.  Psychiatric:        Mood and Affect: Mood normal.        Behavior: Behavior normal.     Lab Results  Component Value Date   WBC 6.3 05/17/2019   HGB 15.7 05/17/2019   HCT 45.7 05/17/2019   PLT 260 05/17/2019   GLUCOSE 87 05/17/2019   CHOL 246 (H) 05/17/2019   TRIG 160 (H) 05/17/2019   HDL 55  05/17/2019   LDLCALC 162 (H) 05/17/2019   ALT 28 05/17/2019   AST 30 05/17/2019   NA 141 05/17/2019   K 4.4 05/17/2019   CL 101 05/17/2019   CREATININE 1.17 05/17/2019   BUN 15 05/17/2019   CO2 23 05/17/2019      Assessment & Plan:  1. Essential hypertension Well controlled.  No changes to medicines.  Continue to work on eating a  healthy diet and exercise.  Labs drawn today.  - Comprehensive metabolic panel - CBC with Differential/Platelet  2. Mixed hyperlipidemia Well controlled.  No changes to medicines.  Continue to work on eating a healthy diet and exercise.  Labs drawn today.  - Lipid panel  3. Non-seasonal allergic rhinitis due to pollen Singular rx given.  May add antihistamine.  4. Other headache syndrome May be due to allergic rhinitis. Hopefully this will help. If not, pt may call.  Follow-up: Return in about 3 months (around 11/16/2019) for fasting.  An After Visit Summary was printed and given to the patient.  [name] [facility] [phone]

## 2019-08-16 ENCOUNTER — Encounter: Payer: Self-pay | Admitting: Family Medicine

## 2019-08-16 ENCOUNTER — Other Ambulatory Visit: Payer: Self-pay

## 2019-08-16 ENCOUNTER — Ambulatory Visit: Payer: 59 | Admitting: Family Medicine

## 2019-08-16 VITALS — BP 118/80 | HR 56 | Temp 98.3°F | Resp 16 | Ht 72.0 in | Wt 193.6 lb

## 2019-08-16 DIAGNOSIS — G4489 Other headache syndrome: Secondary | ICD-10-CM

## 2019-08-16 DIAGNOSIS — E782 Mixed hyperlipidemia: Secondary | ICD-10-CM

## 2019-08-16 DIAGNOSIS — J301 Allergic rhinitis due to pollen: Secondary | ICD-10-CM

## 2019-08-16 DIAGNOSIS — I10 Essential (primary) hypertension: Secondary | ICD-10-CM | POA: Diagnosis not present

## 2019-08-16 HISTORY — DX: Other headache syndrome: G44.89

## 2019-08-16 HISTORY — DX: Allergic rhinitis due to pollen: J30.1

## 2019-08-16 MED ORDER — MONTELUKAST SODIUM 10 MG PO TABS: 10.0000 mg | ORAL_TABLET | Freq: Every day | ORAL | 3 refills | Status: DC

## 2019-08-16 NOTE — Patient Instructions (Signed)
Start singulair 10 mg once daily.  May use claritin, allegra, or zyrtec with it.  No other changes to medicines.  Allergic Rhinitis, Adult Allergic rhinitis is a reaction to allergens in the air. Allergens are tiny specks (particles) in the air that cause your body to have an allergic reaction. This condition cannot be passed from person to person (is not contagious). Allergic rhinitis cannot be cured, but it can be controlled. There are two types of allergic rhinitis:  Seasonal. This type is also called hay fever. It happens only during certain times of the year.  Perennial. This type can happen at any time of the year. What are the causes? This condition may be caused by:  Pollen from grasses, trees, and weeds.  House dust mites.  Pet dander.  Mold. What are the signs or symptoms? Symptoms of this condition include:  Sneezing.  Runny or stuffy nose (nasal congestion).  A lot of mucus in the back of the throat (postnasal drip).  Itchy nose.  Tearing of the eyes.  Trouble sleeping.  Being sleepy during day. How is this treated? There is no cure for this condition. You should avoid things that trigger your symptoms (allergens). Treatment can help to relieve symptoms. This may include:  Medicines that block allergy symptoms, such as antihistamines. These may be given as a shot, nasal spray, or pill.  Shots that are given until your body becomes less sensitive to the allergen (desensitization).  Stronger medicines, if all other treatments have not worked. Follow these instructions at home: Avoiding allergens   Find out what you are allergic to. Common allergens include smoke, dust, and pollen.  Avoid them if you can. These are some of the things that you can do to avoid allergens: ? Replace carpet with wood, tile, or vinyl flooring. Carpet can trap dander and dust. ? Clean any mold found in the home. ? Do not smoke. Do not allow smoking in your home. ? Change your  heating and air conditioning filter at least once a month. ? During allergy season:  Keep windows closed as much as you can. If possible, use air conditioning when there is a lot of pollen in the air.  Use a special filter for allergies with your furnace and air conditioner.  Plan outdoor activities when pollen counts are lowest. This is usually during the early morning or evening hours.  If you do go outdoors when pollen count is high, wear a special mask for people with allergies.  When you come indoors, take a shower and change your clothes before sitting on furniture or bedding. General instructions  Do not use fans in your home.  Do not hang clothes outside to dry.  Wear sunglasses to keep pollen out of your eyes.  Wash your hands right away after you touch household pets.  Take over-the-counter and prescription medicines only as told by your doctor.  Keep all follow-up visits as told by your doctor. This is important. Contact a doctor if:  You have a fever.  You have a cough that does not go away (is persistent).  You start to make whistling sounds when you breathe (wheeze).  Your symptoms do not get better with treatment.  You have thick fluid coming from your nose.  You start to have nosebleeds. Get help right away if:  Your tongue or your lips are swollen.  You have trouble breathing.  You feel dizzy or you feel like you are going to pass out (faint).  You have cold sweats. Summary  Allergic rhinitis is a reaction to allergens in the air.  This condition may be caused by allergens. These include pollen, dust mites, pet dander, and mold.  Symptoms include a runny, itchy nose, sneezing, or tearing eyes. You may also have trouble sleeping or feel sleepy during the day.  Treatment includes taking medicines and avoiding allergens. You may also get shots or take stronger medicines.  Get help if you have a fever or a cough that does not stop. Get help right  away if you are short of breath. This information is not intended to replace advice given to you by your health care provider. Make sure you discuss any questions you have with your health care provider. Document Revised: 07/20/2018 Document Reviewed: 10/20/2017 Elsevier Patient Education  [employer].

## 2019-08-17 ENCOUNTER — Other Ambulatory Visit: Payer: Self-pay

## 2019-08-17 LAB — COMPREHENSIVE METABOLIC PANEL
ALT: 25 IU/L (ref 0–44)
AST: 31 IU/L (ref 0–40)
Albumin/Globulin Ratio: 2.2 (ref 1.2–2.2)
Albumin: 4.6 g/dL (ref 3.8–4.8)
Alkaline Phosphatase: 150 IU/L — ABNORMAL HIGH (ref 39–117)
BUN/Creatinine Ratio: 14 (ref 10–24)
BUN: 15 mg/dL (ref 8–27)
Bilirubin Total: 0.5 mg/dL (ref 0.0–1.2)
CO2: 20 mmol/L (ref 20–29)
Calcium: 9.6 mg/dL (ref 8.6–10.2)
Chloride: 104 mmol/L (ref 96–106)
Creatinine, Ser: 1.04 mg/dL (ref 0.76–1.27)
GFR calc Af Amer: 87 mL/min/{1.73_m2} (ref >59)
GFR calc non Af Amer: 75 mL/min/{1.73_m2} (ref >59)
Globulin, Total: 2.1 g/dL (ref 1.5–4.5)
Glucose: 90 mg/dL (ref 65–99)
Potassium: 4.4 mmol/L (ref 3.5–5.2)
Sodium: 141 mmol/L (ref 134–144)
Total Protein: 6.7 g/dL (ref 6.0–8.5)

## 2019-08-17 LAB — CBC WITH DIFFERENTIAL/PLATELET
Basophils Absolute: 0 10*3/uL (ref 0.0–0.2)
Basos: 0 %
EOS (ABSOLUTE): 0.2 10*3/uL (ref 0.0–0.4)
Eos: 3 %
Hematocrit: 44.1 % (ref 37.5–51.0)
Hemoglobin: 15.3 g/dL (ref 13.0–17.7)
Immature Grans (Abs): 0 10*3/uL (ref 0.0–0.1)
Immature Granulocytes: 0 %
Lymphocytes Absolute: 1.3 10*3/uL (ref 0.7–3.1)
Lymphs: 23 %
MCH: 30 pg (ref 26.6–33.0)
MCHC: 34.7 g/dL (ref 31.5–35.7)
MCV: 87 fL (ref 79–97)
Monocytes Absolute: 0.8 10*3/uL (ref 0.1–0.9)
Monocytes: 14 %
Neutrophils Absolute: 3.3 10*3/uL (ref 1.4–7.0)
Neutrophils: 60 %
Platelets: 253 10*3/uL (ref 150–450)
RBC: 5.1 x10E6/uL (ref 4.14–5.80)
RDW: 14.3 % (ref 11.6–15.4)
WBC: 5.6 10*3/uL (ref 3.4–10.8)

## 2019-08-17 LAB — LIPID PANEL
Chol/HDL Ratio: 4.1 ratio (ref 0.0–5.0)
Cholesterol, Total: 228 mg/dL — ABNORMAL HIGH (ref 100–199)
HDL: 55 mg/dL (ref >39)
LDL Chol Calc (NIH): 151 mg/dL — ABNORMAL HIGH (ref 0–99)
Triglycerides: 125 mg/dL (ref 0–149)
VLDL Cholesterol Cal: 22 mg/dL (ref 5–40)

## 2019-08-17 LAB — CARDIOVASCULAR RISK ASSESSMENT

## 2019-08-18 MED ORDER — EZETIMIBE 10 MG PO TABS: 10.0000 mg | ORAL_TABLET | Freq: Every day | ORAL | 1 refills | Status: DC

## 2019-09-01 ENCOUNTER — Telehealth: Payer: Self-pay

## 2019-09-01 NOTE — Telephone Encounter (Signed)
Agree. I would have him hold singulair for now. Kc

## 2019-09-01 NOTE — Telephone Encounter (Signed)
      Patient called to schedule an appointment for next Tuesday or Wednesday to talk to Dr Tobie Poet about a possible reaction to a medication recently given for a sinus infection (Singulair ??) He complains of dizziness recently.  He also mentioned that he has has some chest discomfort but believes it is coming from the medication.  His BP remains elevated with a diastolic [id number].  I offered him a telephone visit with Dr Tobie Poet and an in person appointment with another provider today however he declined to both.  Dr Tobie Poet had a cancellation on Monday and he took that appointment.  He was advised to monitor his BP and go to the ED if he experienced worsening symptoms.  Shelle Iron, LPN [id number] [phone]

## 2019-09-02 NOTE — Telephone Encounter (Signed)
Patient informed.  He has an appointment on Monday.

## 2019-09-05 ENCOUNTER — Ambulatory Visit: Payer: 59 | Admitting: Family Medicine

## 2019-09-05 ENCOUNTER — Encounter: Payer: Self-pay | Admitting: Family Medicine

## 2019-09-05 ENCOUNTER — Other Ambulatory Visit: Payer: Self-pay

## 2019-09-05 VITALS — BP 138/86 | HR 88 | Temp 97.7°F | Resp 16 | Ht 72.0 in | Wt 193.0 lb

## 2019-09-05 DIAGNOSIS — R0602 Shortness of breath: Secondary | ICD-10-CM | POA: Insufficient documentation

## 2019-09-05 DIAGNOSIS — R0609 Other forms of dyspnea: Secondary | ICD-10-CM | POA: Insufficient documentation

## 2019-09-05 DIAGNOSIS — R55 Syncope and collapse: Secondary | ICD-10-CM | POA: Insufficient documentation

## 2019-09-05 DIAGNOSIS — R05 Cough: Secondary | ICD-10-CM | POA: Diagnosis not present

## 2019-09-05 DIAGNOSIS — R5381 Other malaise: Secondary | ICD-10-CM

## 2019-09-05 DIAGNOSIS — R059 Cough, unspecified: Secondary | ICD-10-CM

## 2019-09-05 DIAGNOSIS — R0600 Dyspnea, unspecified: Secondary | ICD-10-CM | POA: Insufficient documentation

## 2019-09-05 DIAGNOSIS — R011 Cardiac murmur, unspecified: Secondary | ICD-10-CM

## 2019-09-05 HISTORY — DX: Other forms of dyspnea: R06.09

## 2019-09-05 HISTORY — DX: Cough, unspecified: R05.9

## 2019-09-05 HISTORY — DX: Syncope and collapse: R55

## 2019-09-05 HISTORY — DX: Shortness of breath: R06.02

## 2019-09-05 HISTORY — DX: Other malaise: R53.81

## 2019-09-05 LAB — CBC WITH DIFFERENTIAL/PLATELET
Basophils Absolute: 0 10*3/uL (ref 0.0–0.2)
Basos: 1 %
EOS (ABSOLUTE): 0.1 10*3/uL (ref 0.0–0.4)
Eos: 1 %
Hematocrit: 47.1 % (ref 37.5–51.0)
Hemoglobin: 15.9 g/dL (ref 13.0–17.7)
Immature Grans (Abs): 0 10*3/uL (ref 0.0–0.1)
Immature Granulocytes: 1 %
Lymphocytes Absolute: 1 10*3/uL (ref 0.7–3.1)
Lymphs: 17 %
MCH: 30 pg (ref 26.6–33.0)
MCHC: 33.8 g/dL (ref 31.5–35.7)
MCV: 89 fL (ref 79–97)
Monocytes Absolute: 0.6 10*3/uL (ref 0.1–0.9)
Monocytes: 11 %
Neutrophils Absolute: 4 10*3/uL (ref 1.4–7.0)
Neutrophils: 69 %
Platelets: 277 10*3/uL (ref 150–450)
RBC: 5.3 x10E6/uL (ref 4.14–5.80)
RDW: 14.6 % (ref 11.6–15.4)
WBC: 5.7 10*3/uL (ref 3.4–10.8)

## 2019-09-05 LAB — COMPREHENSIVE METABOLIC PANEL
ALT: 24 IU/L (ref 0–44)
AST: 27 IU/L (ref 0–40)
Albumin/Globulin Ratio: 1.9 (ref 1.2–2.2)
Albumin: 4.5 g/dL (ref 3.8–4.8)
Alkaline Phosphatase: 144 IU/L — ABNORMAL HIGH (ref 48–121)
BUN/Creatinine Ratio: 12 (ref 10–24)
BUN: 14 mg/dL (ref 8–27)
Bilirubin Total: 0.3 mg/dL (ref 0.0–1.2)
CO2: 21 mmol/L (ref 20–29)
Calcium: 9.6 mg/dL (ref 8.6–10.2)
Chloride: 104 mmol/L (ref 96–106)
Creatinine, Ser: 1.2 mg/dL (ref 0.76–1.27)
GFR calc Af Amer: 73 mL/min/{1.73_m2} (ref >59)
GFR calc non Af Amer: 63 mL/min/{1.73_m2} (ref >59)
Globulin, Total: 2.4 g/dL (ref 1.5–4.5)
Glucose: 117 mg/dL — ABNORMAL HIGH (ref 65–99)
Potassium: 4.1 mmol/L (ref 3.5–5.2)
Sodium: 140 mmol/L (ref 134–144)
Total Protein: 6.9 g/dL (ref 6.0–8.5)

## 2019-09-05 LAB — TSH: TSH: 1.79 u[IU]/mL (ref 0.450–4.500)

## 2019-09-05 MED ORDER — MELOXICAM 15 MG PO TABS: 15.0000 mg | ORAL_TABLET | Freq: Every day | ORAL | 0 refills | Status: DC | PRN

## 2019-09-05 NOTE — Patient Instructions (Addendum)
Avoid driving.  Stat labs: blood count, chemistry panel, and troponin (heart muscle protein) Get chest xray Anticipate referral to cardiology for a 2 week holter monitor to monitor heart rate. I am also going to order an echocardiogram.

## 2019-09-05 NOTE — Progress Notes (Addendum)
Acute Office Visit  Subjective:    Patient ID: Joe Arroyo, male    DOB: [birth year], 64 y.o.   MRN: [id number]  Chief Complaint  Patient presents with  . Dizziness  . Shortness of Breath  . syncopal episodes x 2    HPI Patient is in today for syncope. He had 2 episodes of syncope - one on Saturday and one on Sunday. He was outside for both of these. He denies dehydration. He denies chest pain or shortness of breath prior to the syncopal episode. He has never had this occur again. The only new medicine in the last 3 weeks has been singulair. He has felt sick over the last week. He has had nasal congestion, malaise, cough, and some shortness of breath. He has some chest discomfort over the right side of his chest.   Past Medical History:  Diagnosis Date  . Hyperlipidemia 05/14/2018  . Hypertension 05/14/2018  . Kidney stones   . Sinus bradycardia 05/14/2018    Past Surgical History:  Procedure Laterality Date  . LAMINECTOMY    . MOLE REMOVAL    . PROSTATE BIOPSY    . TONSILLECTOMY AND ADENOIDECTOMY      Family History  Problem Relation Age of Onset  . Hyperlipidemia Mother   . Aortic stenosis Mother   . Valvular heart disease Mother   . Colon cancer Mother   . Diabetes Mellitus II Mother   . Cancer - Colon Mother   . Cancer Mother   . Stroke Mother   . Cerebrovascular Accident Mother   . Diabetes Mother   . Hypertension Mother   . Hyperlipidemia Father   . Diabetes Mellitus II Father   . Diabetes Father   . CAD Maternal Grandmother   . Congestive Heart Failure Maternal Grandmother   . Heart attack Maternal Grandmother   . Heart failure Maternal Grandmother   . Coronary artery disease Maternal Grandmother   . Hyperlipidemia Maternal Grandmother     Social History   Socioeconomic History  . Marital status: Single    Spouse name: Not on file  . Number of children: Not on file  . Years of education: Not on file  . Highest education level: Not on file    Occupational History  . Not on file  Tobacco Use  . Smoking status: Never Smoker  . Smokeless tobacco: Never Used  Substance and Sexual Activity  . Alcohol use: Yes    Comment: occ  . Drug use: Never  . Sexual activity: Not on file  Other Topics Concern  . Not on file  Social History Narrative  . Not on file   Social Determinants of Health   Financial Resource Strain:   . Difficulty of Paying Living Expenses:   Food Insecurity:   . Worried About Charity fundraiser in the Last Year:   . Arboriculturist in the Last Year:   Transportation Needs:   . Film/video editor (Medical):   [name] Lack of Transportation (Non-Medical):   Physical Activity:   . Days of Exercise per Week:   . Minutes of Exercise per Session:   Stress:   . Feeling of Stress :   Social Connections:   . Frequency of Communication with Friends and Family:   . Frequency of Social Gatherings with Friends and Family:   . Attends Religious Services:   . Active Member of Clubs or Organizations:   . Attends Archivist Meetings:   .  Marital Status:   Intimate Partner Violence:   . Fear of Current or Ex-Partner:   . Emotionally Abused:   [name] Physically Abused:   . Sexually Abused:     Outpatient Medications Prior to Visit  Medication Sig Dispense Refill  . amLODipine (NORVASC) 5 MG tablet Take 1 tablet (5 mg total) by mouth daily. 90 tablet 1  . cloNIDine (CATAPRES) 0.3 MG tablet Take 1 tablet (0.3 mg total) by mouth daily. 90 tablet 1  . esomeprazole (NEXIUM) 20 MG capsule Take 20 mg by mouth daily at 12 noon.    . ezetimibe (ZETIA) 10 MG tablet Take 1 tablet (10 mg total) by mouth daily. 90 tablet 1  . finasteride (PROSCAR) 5 MG tablet Take 1 tablet by mouth daily.    . meloxicam (MOBIC) 15 MG tablet Take 1 tablet by mouth daily as needed.    . montelukast (SINGULAIR) 10 MG tablet Take 1 tablet (10 mg total) by mouth at bedtime. 30 tablet 3  . Multiple Vitamins-Minerals (CENTRUM SILVER ULTRA MENS  PO) Take 1 tablet by mouth daily.    . Omega-3 Fatty Acids (FISH OIL PO) Take 1 tablet by mouth 2 (two) times daily.    [name] telmisartan (MICARDIS) 40 MG tablet Take 1 tablet (40 mg total) by mouth 2 (two) times daily. (Patient taking differently: Take 40 mg by mouth daily. ) 180 tablet 0   No facility-administered medications prior to visit.    Allergies  Allergen Reactions  . Bystolic [Nebivolol Hcl] Other (See Comments)    bradycardia  . Carvedilol Other (See Comments)    bradycardia  . Pravastatin Other (See Comments)    "made my joints hurt"  . Hctz [Hydrochlorothiazide] Rash  . Hydralazine Rash  . Penicillins Rash    Review of Systems  Constitutional: Positive for fatigue. Negative for chills and fever.  HENT: Positive for congestion and rhinorrhea. Negative for ear pain.   Respiratory: Positive for cough and shortness of breath.   Cardiovascular: Positive for chest pain and palpitations.  Gastrointestinal: Negative for abdominal pain, nausea and vomiting.  Genitourinary: Negative for dysuria and frequency.  Musculoskeletal: Negative for arthralgias, back pain and myalgias.  Neurological: Positive for dizziness and weakness. Negative for headaches.       Objective:    Physical Exam Constitutional:      Appearance: Normal appearance. He is normal weight.  HENT:     Right Ear: Tympanic membrane, ear canal and external ear normal.     Left Ear: Tympanic membrane, ear canal and external ear normal.     Nose: Mucosal edema and congestion present. No rhinorrhea.     Mouth/Throat:     Mouth: Mucous membranes are moist.     Pharynx: No oropharyngeal exudate or posterior oropharyngeal erythema.  Cardiovascular:     Rate and Rhythm: Normal rate and regular rhythm.     Heart sounds: Murmur (loud BL upper sternal borders) present.  Pulmonary:     Effort: Pulmonary effort is normal. No respiratory distress.     Breath sounds: Normal breath sounds. No wheezing, rhonchi or rales.   Abdominal:     General: Bowel sounds are normal.     Palpations: Abdomen is soft.     Tenderness: There is no abdominal tenderness.  Lymphadenopathy:     Cervical: No cervical adenopathy.  Neurological:     Mental Status: He is alert.  Psychiatric:        Mood and Affect: Mood normal.  Behavior: Behavior normal.     BP 138/86   Pulse 88   Temp 97.7 F (36.5 C)   Resp 16   Ht 6' (1.829 m)   Wt 193 lb (87.5 kg)   BMI 26.18 kg/m  Wt Readings from Last 3 Encounters:  09/05/19 193 lb (87.5 kg)  08/16/19 193 lb 9.6 oz (87.8 kg)  05/17/19 193 lb (87.5 kg)    Health Maintenance Due  Topic Date Due  . Hepatitis C Screening  Never done  . HIV Screening  Never done  . COLONOSCOPY  Never done  . PNA vac Low Risk Adult (1 of 2 - PCV13) Never done    There are no preventive care reminders to display for this patient.   No results found for: TSH Lab Results  Component Value Date   WBC 5.6 08/16/2019   HGB 15.3 08/16/2019   HCT 44.1 08/16/2019   MCV 87 08/16/2019   PLT 253 08/16/2019   Lab Results  Component Value Date   NA 141 08/16/2019   K 4.4 08/16/2019   CO2 20 08/16/2019   GLUCOSE 90 08/16/2019   BUN 15 08/16/2019   CREATININE 1.04 08/16/2019   BILITOT 0.5 08/16/2019   ALKPHOS 150 (H) 08/16/2019   AST 31 08/16/2019   ALT 25 08/16/2019   PROT 6.7 08/16/2019   ALBUMIN 4.6 08/16/2019   CALCIUM 9.6 08/16/2019   Lab Results  Component Value Date   CHOL 228 (H) 08/16/2019   Lab Results  Component Value Date   HDL 55 08/16/2019   Lab Results  Component Value Date   LDLCALC 151 (H) 08/16/2019   Lab Results  Component Value Date   TRIG 125 08/16/2019   Lab Results  Component Value Date   CHOLHDL 4.1 08/16/2019   No results found for: HGBA1C     Assessment & Plan:  1. Syncope, unspecified syncope type Recommend holter monitor. - EKG 12-Lead NSR. No changes. - Ambulatory referral to Cardiology  2. Malaise Check labs.   3.  Cough  Check cxr.   4. Dyspnea -  Check stat troponin ([facility] PHLEBOTOMY TUBE WAS NOT KEPT FROZEN DURING TRANSPORT, SO TROPONIN WAS NOT RAN.)   5. Heart Murmur - order echocardiogram. Concerning for Aortic stenosis.  Follow-up: Return in about 3 months (around 11/22/2019) for previously scheduled appt. .  An After Visit Summary was printed and given to the patient.  [name] [facility] [phone]

## 2019-09-06 ENCOUNTER — Encounter: Payer: Self-pay | Admitting: Cardiology

## 2019-09-06 ENCOUNTER — Ambulatory Visit: Payer: 59 | Admitting: Cardiology

## 2019-09-06 ENCOUNTER — Ambulatory Visit (INDEPENDENT_AMBULATORY_CARE_PROVIDER_SITE_OTHER): Payer: 59

## 2019-09-06 VITALS — BP 160/96 | HR 61 | Ht 72.0 in | Wt 194.0 lb

## 2019-09-06 DIAGNOSIS — I10 Essential (primary) hypertension: Secondary | ICD-10-CM | POA: Diagnosis not present

## 2019-09-06 DIAGNOSIS — E782 Mixed hyperlipidemia: Secondary | ICD-10-CM

## 2019-09-06 DIAGNOSIS — R55 Syncope and collapse: Secondary | ICD-10-CM | POA: Diagnosis not present

## 2019-09-06 NOTE — Addendum Note (Signed)
Addended byRochel Brome on: 09/06/2019 01:39 PM   Modules accepted: Orders

## 2019-09-06 NOTE — Progress Notes (Signed)
Cardiology Office Note:    Date:  09/06/2019   ID:  Joe Arroyo, DOB 19-Oct-1955, MRN [id number]  PCP:  Joe Brome, MD  Cardiologist:  Joe Lindau, MD   Referring MD: Joe Brome, MD    ASSESSMENT:    1. Syncope, unspecified syncope type   2. Essential hypertension   3. Mixed hyperlipidemia    PLAN:    In order of problems listed above:  1. Syncope.  Unclear etiology.  His blood pressure stable.  I told the patient to keep himself well-hydrated.  Blood work was done at primary care physician and the review was unremarkable.  To evaluate this further I would like to do a 2-week event monitoring to see if there is any arrhythmias.  I do not see any reason for stress testing.  He is an active gentleman I told him to walk half an hour a day 5 days a week and I will review this when he sees me in follow-up appointment in a month.  He knows to go to nearest emergency room for any concerning symptoms.  In view of syncope I advised him not to drive until cleared by his primary care physician. 2. Essential hypertension: Blood pressure is stable past evaluation at primary care physician's office was unremarkable and he will keep a track of his blood pressures at home.  Diet was specified. 3. Cardiac murmur: Echocardiogram will be done to assess this. 4. Mixed dyslipidemia: Diet was emphasized and his primary care physician is following this issue. 5. Patient will be seen in follow-up appointment in 1 months or earlier if the patient has any concerns    Medication Adjustments/Labs and Tests Ordered: Current medicines are reviewed at length with the patient today.  Concerns regarding medicines are outlined above.  No orders of the defined types were placed in this encounter.  No orders of the defined types were placed in this encounter.    History of Present Illness:    Joe Arroyo is a 64 y.o. male who is being seen today for the evaluation of syncope at the request of Cox,  Kirsten, MD.  Patient is a pleasant 64 year old male.  He has past medical history of essential hypertension and dyslipidemia.  He mentions to me that he was weed eating and he fell.  He passed out for a few seconds and subsequently woke up and that was fine.  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  No chest pain orthopnea or PND.  At the time of my evaluation, the patient is alert awake oriented and in no distress.  He is an active gentleman otherwise.  He takes care of activities of daily living.  He does not exercise on a regular basis.  Past Medical History:  Diagnosis Date  . Hyperlipidemia 05/14/2018  . Hypertension 05/14/2018  . Kidney stones   . Sinus bradycardia 05/14/2018    Past Surgical History:  Procedure Laterality Date  . LAMINECTOMY    . MOLE REMOVAL    . PROSTATE BIOPSY    . TONSILLECTOMY AND ADENOIDECTOMY      Current Medications: Current Meds  Medication Sig  . amLODipine (NORVASC) 5 MG tablet Take 1 tablet (5 mg total) by mouth daily.  . cloNIDine (CATAPRES) 0.3 MG tablet Take 1 tablet (0.3 mg total) by mouth daily.  [name] esomeprazole (NEXIUM) 20 MG capsule Take 20 mg by  mouth daily at 12 noon.  . ezetimibe (ZETIA) 10 MG tablet Take 1 tablet (10 mg total) by mouth daily.  . finasteride (PROSCAR) 5 MG tablet Take 1 tablet by mouth daily.  . meloxicam (MOBIC) 15 MG tablet Take 1 tablet (15 mg total) by mouth daily as needed.  . montelukast (SINGULAIR) 10 MG tablet Take 1 tablet (10 mg total) by mouth at bedtime.  . Multiple Vitamins-Minerals (CENTRUM SILVER ULTRA MENS PO) Take 1 tablet by mouth daily.  . Omega-3 Fatty Acids (FISH OIL PO) Take 1 tablet by mouth 2 (two) times daily.  [name] telmisartan (MICARDIS) 40 MG tablet Take 1 tablet (40 mg total) by mouth 2 (two) times daily. (Patient taking differently: Take 40 mg by mouth daily. )     Allergies:   Bystolic [nebivolol hcl],  Carvedilol, Pravastatin, Hctz [hydrochlorothiazide], Hydralazine, and Penicillins   Social History   Socioeconomic History  . Marital status: Single    Spouse name: Not on file  . Number of children: Not on file  . Years of education: Not on file  . Highest education level: Not on file  Occupational History  . Not on file  Tobacco Use  . Smoking status: Never Smoker  . Smokeless tobacco: Never Used  Substance and Sexual Activity  . Alcohol use: Yes    Comment: occ  . Drug use: Never  . Sexual activity: Not on file  Other Topics Concern  . Not on file  Social History Narrative  . Not on file   Social Determinants of Health   Financial Resource Strain:   . Difficulty of Paying Living Expenses:   Food Insecurity:   . Worried About Charity fundraiser in the Last Year:   . Arboriculturist in the Last Year:   Transportation Needs:   . Film/video editor (Medical):   [name] Lack of Transportation (Non-Medical):   Physical Activity:   . Days of Exercise per Week:   . Minutes of Exercise per Session:   Stress:   . Feeling of Stress :   Social Connections:   . Frequency of Communication with Friends and Family:   . Frequency of Social Gatherings with Friends and Family:   . Attends Religious Services:   . Active Member of Clubs or Organizations:   . Attends Archivist Meetings:   [name] Marital Status:      Family History: The patient's family history includes Aortic stenosis in his mother; CAD in his maternal grandmother; Cancer in his mother; Cancer - Colon in his mother; Cerebrovascular Accident in his mother; Colon cancer in his mother; Congestive Heart Failure in his maternal grandmother; Coronary artery disease in his maternal grandmother; Diabetes in his father and mother; Diabetes Mellitus II in his father and mother; Heart attack in his maternal grandmother; Heart failure in his maternal grandmother; Hyperlipidemia in his father, maternal grandmother, and mother;  Hypertension in his mother; Stroke in his mother; Valvular heart disease in his mother.  ROS:   Please see the history of present illness.    All other systems reviewed and are negative.  EKGs/Labs/Other Studies Reviewed:    The following studies were reviewed today: EKG reveals sinus rhythm and nonspecific ST-T changes   Recent Labs: 09/05/2019: ALT 24; BUN 14; Creatinine, Ser 1.20; Hemoglobin 15.9; Platelets 277; Potassium 4.1; Sodium 140; TSH 1.790  Recent Lipid Panel    Component Value Date/Time   CHOL 228 (H) 08/16/2019 0957   TRIG 125 08/16/2019  0957   HDL 55 08/16/2019 0957   CHOLHDL 4.1 08/16/2019 0957   LDLCALC 151 (H) 08/16/2019 0957    Physical Exam:    VS:  BP (!) 160/96   Pulse 61   Ht 6' (1.829 m)   Wt 194 lb (88 kg)   SpO2 97%   BMI 26.31 kg/m     Wt Readings from Last 3 Encounters:  09/06/19 194 lb (88 kg)  09/05/19 193 lb (87.5 kg)  08/16/19 193 lb 9.6 oz (87.8 kg)     GEN: Patient is in no acute distress HEENT: Normal NECK: No JVD; No carotid bruits LYMPHATICS: No lymphadenopathy CARDIAC: S1 S2 regular, 2/6 systolic murmur at the apex. RESPIRATORY:  Clear to auscultation without rales, wheezing or rhonchi  ABDOMEN: Soft, non-tender, non-distended MUSCULOSKELETAL:  No edema; No deformity  SKIN: Warm and dry NEUROLOGIC:  Alert and oriented x 3 PSYCHIATRIC:  Normal affect    Signed, Joe Lindau, MD  09/06/2019 4:04 PM    [facility]

## 2019-09-06 NOTE — Patient Instructions (Signed)
Medication Instructions:  No medication changes. *If you need a refill on your cardiac medications before your next appointment, please call your pharmacy*   Lab Work: None ordered If you have labs (blood work) drawn today and your tests are completely normal, you will receive your results only by: [name] MyChart Message (if you have MyChart) OR . A paper copy in the mail If you have any lab test that is abnormal or we need to change your treatment, we will call you to review the results.   Testing/Procedures: Your physician has requested that you have an echocardiogram. Echocardiography is a painless test that uses sound waves to create images of your heart. It provides your doctor with information about the size and shape of your heart and how well your heart's chambers and valves are working. This procedure takes approximately one hour. There are no restrictions for this procedure.   WHY IS MY DOCTOR PRESCRIBING ZIO? The Zio system is proven and trusted by physicians to detect and diagnose irregular heart rhythms -- and has been prescribed to hundreds of thousands of patients.  The FDA has cleared the Zio system to monitor for many different kinds of irregular heart rhythms. In a study, physicians were able to reach a diagnosis 90% of the time with the Zio system1.  You can wear the Zio monitor -- a small, discreet, comfortable patch -- during your normal day-to-day activity, including while you sleep, shower, and exercise, while it records every single heartbeat for analysis.  1Barrett, P., et al. Comparison of 24 Hour Holter Monitoring Versus 14 Day Novel Adhesive Patch Electrocardiographic Monitoring. [place], 2014.  ZIO VS. HOLTER MONITORING The Zio monitor can be comfortably worn for up to 14 days. Holter monitors can be worn for 24 to 48 hours, limiting the time to record any irregular heart rhythms you may have. Zio is able to capture data for the 51% of patients  who have their first symptom-triggered arrhythmia after 48 hours.1  LIVE WITHOUT RESTRICTIONS The Zio ambulatory cardiac monitor is a small, unobtrusive, and water-resistant patch--you might even forget you're wearing it. The Zio monitor records and stores every beat of your heart, whether you're sleeping, working out, or showering.  Wear for 2 weeks and remove 09/20/19.   Follow-Up: At [facility], you and your health needs are our priority.  As part of our continuing mission to provide you with exceptional heart care, we have created designated Provider Care Teams.  These Care Teams include your primary Cardiologist (physician) and Advanced Practice Providers (APPs -  Physician Assistants and Nurse Practitioners) who all work together to provide you with the care you need, when you need it.  We recommend signing up for the patient portal called "MyChart".  Sign up information is provided on this After Visit Summary.  MyChart is used to connect with patients for Virtual Visits (Telemedicine).  Patients are able to view lab/test results, encounter notes, upcoming appointments, etc.  Non-urgent messages can be sent to your provider as well.   To learn more about what you can do with MyChart, go to [email or web address].    Your next appointment:   1 month(s)  The format for your next appointment:   In Person  Provider:   Jyl Heinz, MD   Other Instructions  Echocardiogram An echocardiogram is a procedure that uses painless sound waves (ultrasound) to produce an image of the heart. Images from an echocardiogram can provide important information about:  Signs of coronary  artery disease (CAD).  Aneurysm detection. An aneurysm is a weak or damaged part of an artery wall that bulges out from the normal force of blood pumping through the body.  Heart size and shape. Changes in the size or shape of the heart can be associated with certain conditions, including heart failure, aneurysm,  and CAD.  Heart muscle function.  Heart valve function.  Signs of a past heart attack.  Fluid buildup around the heart.  Thickening of the heart muscle.  A tumor or infectious growth around the heart valves. Tell a health care provider about:  Any allergies you have.  All medicines you are taking, including vitamins, herbs, eye drops, creams, and over-the-counter medicines.  Any blood disorders you have.  Any surgeries you have had.  Any medical conditions you have.  Whether you are pregnant or may be pregnant. What are the risks? Generally, this is a safe procedure. However, problems may occur, including:  Allergic reaction to dye (contrast) that may be used during the procedure. What happens before the procedure? No specific preparation is needed. You may eat and drink normally. What happens during the procedure?   An IV tube may be inserted into one of your veins.  You may receive contrast through this tube. A contrast is an injection that improves the quality of the pictures from your heart.  A gel will be applied to your chest.  A wand-like tool (transducer) will be moved over your chest. The gel will help to transmit the sound waves from the transducer.  The sound waves will harmlessly bounce off of your heart to allow the heart images to be captured in real-time motion. The images will be recorded on a computer. The procedure may vary among health care providers and hospitals. What happens after the procedure?  You may return to your normal, everyday life, including diet, activities, and medicines, unless your health care provider tells you not to do that. Summary  An echocardiogram is a procedure that uses painless sound waves (ultrasound) to produce an image of the heart.  Images from an echocardiogram can provide important information about the size and shape of your heart, heart muscle function, heart valve function, and fluid buildup around your  heart.  You do not need to do anything to prepare before this procedure. You may eat and drink normally.  After the echocardiogram is completed, you may return to your normal, everyday life, unless your health care provider tells you not to do that. This information is not intended to replace advice given to you by your health care provider. Make sure you discuss any questions you have with your health care provider. Document Revised: 07/22/2018 Document Reviewed: 05/03/2016 Elsevier Patient Education  [place].

## 2019-09-06 NOTE — Addendum Note (Signed)
Addended by: Truddie Hidden on: 09/06/2019 04:20 PM   Modules accepted: Orders

## 2019-09-15 ENCOUNTER — Telehealth: Payer: Self-pay

## 2019-09-15 ENCOUNTER — Other Ambulatory Visit: Payer: Self-pay | Admitting: Family Medicine

## 2019-09-15 MED ORDER — CEFDINIR 300 MG PO CAPS: 300.0000 mg | ORAL_CAPSULE | Freq: Two times a day (BID) | ORAL | 0 refills | Status: DC

## 2019-09-15 NOTE — Telephone Encounter (Signed)
Joe Arroyo called to report that his shortness of breath has not improved.  Dr. Tobie Poet is going to send an antibiotic and he was encouraged to follow-up with cardiology is symptoms worsen or do not improve.

## 2019-09-15 NOTE — Progress Notes (Signed)
Sent cefdinir. Kc

## 2019-09-27 ENCOUNTER — Other Ambulatory Visit: Payer: Self-pay

## 2019-09-27 ENCOUNTER — Ambulatory Visit (INDEPENDENT_AMBULATORY_CARE_PROVIDER_SITE_OTHER): Payer: 59

## 2019-09-27 DIAGNOSIS — R55 Syncope and collapse: Secondary | ICD-10-CM | POA: Diagnosis not present

## 2019-09-27 NOTE — Progress Notes (Signed)
Complete echocardiogram has been performed.  Jimmy Jaydrien Wassenaar RDCS, RVT 

## 2019-10-18 ENCOUNTER — Other Ambulatory Visit: Payer: Self-pay

## 2019-10-18 MED ORDER — AMLODIPINE BESYLATE 5 MG PO TABS: 5.0000 mg | ORAL_TABLET | Freq: Every day | ORAL | 1 refills | Status: DC

## 2019-11-22 ENCOUNTER — Ambulatory Visit: Payer: 59 | Admitting: Family Medicine

## 2019-11-22 ENCOUNTER — Other Ambulatory Visit: Payer: Self-pay

## 2019-11-22 ENCOUNTER — Encounter: Payer: Self-pay | Admitting: Family Medicine

## 2019-11-22 VITALS — BP 130/80 | HR 77 | Temp 97.4°F | Ht 72.0 in | Wt 191.0 lb

## 2019-11-22 DIAGNOSIS — G4459 Other complicated headache syndrome: Secondary | ICD-10-CM | POA: Diagnosis not present

## 2019-11-22 DIAGNOSIS — I10 Essential (primary) hypertension: Secondary | ICD-10-CM

## 2019-11-22 DIAGNOSIS — E782 Mixed hyperlipidemia: Secondary | ICD-10-CM

## 2019-11-22 DIAGNOSIS — R0602 Shortness of breath: Secondary | ICD-10-CM

## 2019-11-22 DIAGNOSIS — R55 Syncope and collapse: Secondary | ICD-10-CM

## 2019-11-22 NOTE — Progress Notes (Signed)
Subjective:  Patient ID: Joe Arroyo, male    DOB: [birth year]  Age: 64 y.o. MRN: [id number]  Chief Complaint  Patient presents with  . Hyperlipidemia  . Hypertension    HPI Pt presents for follow up of hypertension.  Date of diagnosis at the age of 78.  His current cardiac medication regimen includes CLONIDINE 0.3 MG ONCE AT NIGHT and MICARDIS 40 mg once a day and amlodipine 5 mg once daily. Review of his blood pressure log reveals systolics in the 102-585  and diastolics in the 27-78 s range.  Compliance with treatment has been good; he takes his medication as directed, maintains his diet and exercise regimen, and follows up as directed.       Pt presents with hyperlipidemia.  Current treatment includes zetia, fenofibrate, and fish oil.  Refuses statin. Compliance with treatment has been good; he takes his medication as directed, maintains his low cholesterol diet, follows up as directed, and maintains his exercise regimen.    Dypsnea: not exerting himself much due to this. Has been this way since bronchitis. Never smoked. Never known to have asthma. Keeps a stuffy nose.   SYNCOPE: echo showed normal. holter monitor normal. Saw Dr. Lennox Pippins. Has not recurred.  No seizure activity when it happened.   Headaches: headaches daily. Upper face behind eyes. no associated nause or vomiting. No change in headaches with positional. Meloxicam has helped, but not resolved. Sometimes has an aura. Headaches are increasing frequency and intensity.   Past Medical History:  Diagnosis Date  . Hyperlipidemia 05/14/2018  . Hypertension 05/14/2018  . Kidney stones   . Sinus bradycardia 05/14/2018   Past Surgical History:  Procedure Laterality Date  . LAMINECTOMY    . MOLE REMOVAL    . PROSTATE BIOPSY    . TONSILLECTOMY AND ADENOIDECTOMY      Family History  Problem Relation Age of Onset  . Hyperlipidemia Mother   . Aortic stenosis Mother   . Valvular heart disease Mother   . Colon cancer Mother     . Diabetes Mellitus II Mother   . Cancer - Colon Mother   . Cancer Mother   . Stroke Mother   . Cerebrovascular Accident Mother   . Diabetes Mother   . Hypertension Mother   . Hyperlipidemia Father   . Diabetes Mellitus II Father   . Diabetes Father   . CAD Maternal Grandmother   . Congestive Heart Failure Maternal Grandmother   . Heart attack Maternal Grandmother   . Heart failure Maternal Grandmother   . Coronary artery disease Maternal Grandmother   . Hyperlipidemia Maternal Grandmother    Social History   Socioeconomic History  . Marital status: Single    Spouse name: Not on file  . Number of children: Not on file  . Years of education: Not on file  . Highest education level: Not on file  Occupational History  . Not on file  Tobacco Use  . Smoking status: Never Smoker  . Smokeless tobacco: Never Used  Vaping Use  . Vaping Use: Never used  Substance and Sexual Activity  . Alcohol use: Yes    Comment: occ  . Drug use: Never  . Sexual activity: Not on file  Other Topics Concern  . Not on file  Social History Narrative  . Not on file   Social Determinants of Health   Financial Resource Strain:   . Difficulty of Paying Living Expenses:   Food Insecurity:   . Worried About  Running Out of Food in the Last Year:   . Why in the Last Year:   Transportation Needs:   . Lack of Transportation (Medical):   [name] Lack of Transportation (Non-Medical):   Physical Activity:   . Days of Exercise per Week:   . Minutes of Exercise per Session:   Stress:   . Feeling of Stress :   Social Connections:   . Frequency of Communication with Friends and Family:   . Frequency of Social Gatherings with Friends and Family:   . Attends Religious Services:   . Active Member of Clubs or Organizations:   . Attends Archivist Meetings:   [name] Marital Status:     Review of Systems  Constitutional: Positive for fatigue. Negative for chills, diaphoresis and fever.   HENT: Positive for congestion (tried claritin and zyrtec with out improvement in headaches or congestion.). Negative for ear pain and sore throat.   Respiratory: Positive for shortness of breath (x 2-3 month with moderate - high exertion. Worse in the last 2-3 months. ). Negative for cough.   Cardiovascular: Negative for chest pain and leg swelling.  Gastrointestinal: Negative for abdominal pain, constipation, diarrhea, nausea and vomiting.  Genitourinary: Negative for dysuria and urgency.  Musculoskeletal: Negative for arthralgias and myalgias.  Neurological: Positive for headaches (daily. Upper face behind eyes. no associated nause or vomiting. No change in headaches with positional. Meloxicam has helped, but not resolved. ). Negative for dizziness.  Psychiatric/Behavioral: Negative for dysphoric mood and sleep disturbance. The patient is not nervous/anxious.        No anhedonia.     Objective:  BP 130/80   Pulse 77   Temp (!) 97.4 F (36.3 C)   Ht 6' (1.829 m)   Wt 191 lb (86.6 kg)   SpO2 97%   BMI 25.90 kg/m   BP/Weight 11/22/2019 09/06/2019 1/93/7902  Systolic BP 409 735 329  Diastolic BP 80 96 86  Wt. (Lbs) 191 194 193  BMI 25.9 26.31 26.18    Physical Exam Vitals reviewed.  Constitutional:      Appearance: Normal appearance. He is normal weight.  Neck:     Vascular: No carotid bruit.  Cardiovascular:     Rate and Rhythm: Normal rate and regular rhythm.  Pulmonary:     Effort: Pulmonary effort is normal.     Breath sounds: Normal breath sounds.  Abdominal:     General: Abdomen is flat. Bowel sounds are normal.     Palpations: Abdomen is soft.  Neurological:     Mental Status: He is alert and oriented to person, place, and time.  Psychiatric:        Mood and Affect: Mood normal.        Behavior: Behavior normal.     Lab Results  Component Value Date   WBC 5.7 09/05/2019   HGB 15.9 09/05/2019   HCT 47.1 09/05/2019   PLT 277 09/05/2019   GLUCOSE 117 (H)  09/05/2019   CHOL 228 (H) 08/16/2019   TRIG 125 08/16/2019   HDL 55 08/16/2019   LDLCALC 151 (H) 08/16/2019   ALT 24 09/05/2019   AST 27 09/05/2019   NA 140 09/05/2019   K 4.1 09/05/2019   CL 104 09/05/2019   CREATININE 1.20 09/05/2019   BUN 14 09/05/2019   CO2 21 09/05/2019   TSH 1.790 09/05/2019      Assessment & Plan:  1. Essential hypertension Well controlled.  No changes to medicines.  Continue to work on eating a healthy diet and exercise.  Labs drawn today.  - Comprehensive metabolic panel - CBC with Differential/Platelet  2. Mixed hyperlipidemia Well controlled.  No changes to medicines.  Continue to work on eating a healthy diet and exercise.  Labs drawn today.  - Lipid panel  3. Other headache syndrome - worsening in frequency and intensity. Trial on emgality because some of headaches have an associated aura. Never had imaging.   - Order Ct of brain.  4. Dypsnea -  Peak flows - 450, 475, 450. Normal.   5. Syncope: no further issues.  Follow-up: No follow-ups on file.  An After Visit Summary was printed and given to the patient.  [name] [facility] [phone]

## 2019-11-23 LAB — COMPREHENSIVE METABOLIC PANEL
ALT: 27 IU/L (ref 0–44)
AST: 31 IU/L (ref 0–40)
Albumin/Globulin Ratio: 1.9 (ref 1.2–2.2)
Albumin: 4.6 g/dL (ref 3.8–4.8)
Alkaline Phosphatase: 148 IU/L — ABNORMAL HIGH (ref 48–121)
BUN/Creatinine Ratio: 11 (ref 10–24)
BUN: 11 mg/dL (ref 8–27)
Bilirubin Total: 0.6 mg/dL (ref 0.0–1.2)
CO2: 24 mmol/L (ref 20–29)
Calcium: 9.9 mg/dL (ref 8.6–10.2)
Chloride: 101 mmol/L (ref 96–106)
Creatinine, Ser: 1.04 mg/dL (ref 0.76–1.27)
GFR calc Af Amer: 87 mL/min/{1.73_m2} (ref >59)
GFR calc non Af Amer: 75 mL/min/{1.73_m2} (ref >59)
Globulin, Total: 2.4 g/dL (ref 1.5–4.5)
Glucose: 87 mg/dL (ref 65–99)
Potassium: 4.4 mmol/L (ref 3.5–5.2)
Sodium: 139 mmol/L (ref 134–144)
Total Protein: 7 g/dL (ref 6.0–8.5)

## 2019-11-23 LAB — CBC WITH DIFFERENTIAL/PLATELET
Basophils Absolute: 0 10*3/uL (ref 0.0–0.2)
Basos: 1 %
EOS (ABSOLUTE): 0.1 10*3/uL (ref 0.0–0.4)
Eos: 2 %
Hematocrit: 47.7 % (ref 37.5–51.0)
Hemoglobin: 15.7 g/dL (ref 13.0–17.7)
Immature Grans (Abs): 0 10*3/uL (ref 0.0–0.1)
Immature Granulocytes: 0 %
Lymphocytes Absolute: 1.2 10*3/uL (ref 0.7–3.1)
Lymphs: 21 %
MCH: 29.5 pg (ref 26.6–33.0)
MCHC: 32.9 g/dL (ref 31.5–35.7)
MCV: 90 fL (ref 79–97)
Monocytes Absolute: 0.8 10*3/uL (ref 0.1–0.9)
Monocytes: 14 %
Neutrophils Absolute: 3.5 10*3/uL (ref 1.4–7.0)
Neutrophils: 62 %
Platelets: 254 10*3/uL (ref 150–450)
RBC: 5.32 x10E6/uL (ref 4.14–5.80)
RDW: 14.3 % (ref 11.6–15.4)
WBC: 5.6 10*3/uL (ref 3.4–10.8)

## 2019-11-23 LAB — LIPID PANEL
Chol/HDL Ratio: 4.4 ratio (ref 0.0–5.0)
Cholesterol, Total: 244 mg/dL — ABNORMAL HIGH (ref 100–199)
HDL: 56 mg/dL (ref >39)
LDL Chol Calc (NIH): 162 mg/dL — ABNORMAL HIGH (ref 0–99)
Triglycerides: 146 mg/dL (ref 0–149)
VLDL Cholesterol Cal: 26 mg/dL (ref 5–40)

## 2019-11-23 LAB — CARDIOVASCULAR RISK ASSESSMENT

## 2019-11-24 NOTE — Progress Notes (Signed)
Blood count normal.  Liver function normal Kidney function normal Lipid panel abnormal. LDL quite high. Recommend continue zetia, but ask if he is willing to try crestor 5 mg once daily. I have he had muscle pain with pravastatin, but not sure about others.  Low fat diet and exercise.

## 2019-12-27 ENCOUNTER — Ambulatory Visit: Payer: 59 | Admitting: Family Medicine

## 2019-12-27 ENCOUNTER — Encounter: Payer: Self-pay | Admitting: Family Medicine

## 2019-12-27 ENCOUNTER — Other Ambulatory Visit: Payer: Self-pay

## 2019-12-27 VITALS — BP 138/74 | HR 80 | Temp 96.5°F | Resp 18 | Ht 72.0 in | Wt 195.0 lb

## 2019-12-27 DIAGNOSIS — Z23 Encounter for immunization: Secondary | ICD-10-CM

## 2019-12-27 DIAGNOSIS — G4489 Other headache syndrome: Secondary | ICD-10-CM | POA: Diagnosis not present

## 2019-12-27 DIAGNOSIS — J0181 Other acute recurrent sinusitis: Secondary | ICD-10-CM

## 2019-12-27 MED ORDER — MOXIFLOXACIN HCL 400 MG PO TABS: 400.0000 mg | ORAL_TABLET | Freq: Every day | ORAL | 0 refills | Status: DC

## 2019-12-27 NOTE — Progress Notes (Signed)
Subjective:  Patient ID: Joe Arroyo, male    DOB: Jul 27, 1955  Age: 64 y.o. MRN: [id number]  Chief Complaint  Patient presents with  . Hypertension    HPI Headaches: emgality loading dose. No help. Advil most days. advil helps, but does not take it fully away.Insurance would not approve ct of brain for worsening headaches. Patient's headaches or over frontal and maxillary sinuses. He does complain of congestion and feels like it worsens with weather changes.  Hypertension - 130/80s,  Pulse 60. Currently on telmasartan, clonidine, and amlodipine. Denies chest pain.   Current Outpatient Medications on File Prior to Visit  Medication Sig Dispense Refill  . amLODipine (NORVASC) 5 MG tablet Take 1 tablet (5 mg total) by mouth daily. 90 tablet 1  . cloNIDine (CATAPRES) 0.3 MG tablet Take 1 tablet (0.3 mg total) by mouth daily. 90 tablet 1  . esomeprazole (NEXIUM) 20 MG capsule Take 20 mg by mouth daily at 12 noon.    . ezetimibe (ZETIA) 10 MG tablet Take 1 tablet (10 mg total) by mouth daily. 90 tablet 1  . finasteride (PROSCAR) 5 MG tablet Take 1 tablet by mouth daily.    . meloxicam (MOBIC) 15 MG tablet Take 1 tablet (15 mg total) by mouth daily as needed. 90 tablet 0  . Multiple Vitamins-Minerals (CENTRUM SILVER ULTRA MENS PO) Take 1 tablet by mouth daily.    . Omega-3 Fatty Acids (FISH OIL PO) Take 1 tablet by mouth 2 (two) times daily.    [name] telmisartan (MICARDIS) 40 MG tablet Take 1 tablet (40 mg total) by mouth 2 (two) times daily. (Patient taking differently: Take 40 mg by mouth daily. ) 180 tablet 0   No current facility-administered medications on file prior to visit.   Past Medical History:  Diagnosis Date  . Hyperlipidemia 05/14/2018  . Hypertension 05/14/2018  . Kidney stones   . Sinus bradycardia 05/14/2018   Past Surgical History:  Procedure Laterality Date  . LAMINECTOMY    . MOLE REMOVAL    . PROSTATE BIOPSY    . TONSILLECTOMY AND ADENOIDECTOMY      Family History    Problem Relation Age of Onset  . Hyperlipidemia Mother   . Aortic stenosis Mother   . Valvular heart disease Mother   . Colon cancer Mother   . Diabetes Mellitus II Mother   . Cancer - Colon Mother   . Cancer Mother   . Stroke Mother   . Cerebrovascular Accident Mother   . Diabetes Mother   . Hypertension Mother   . Hyperlipidemia Father   . Diabetes Mellitus II Father   . Diabetes Father   . CAD Maternal Grandmother   . Congestive Heart Failure Maternal Grandmother   . Heart attack Maternal Grandmother   . Heart failure Maternal Grandmother   . Coronary artery disease Maternal Grandmother   . Hyperlipidemia Maternal Grandmother    Social History   Socioeconomic History  . Marital status: Single    Spouse name: Not on file  . Number of children: Not on file  . Years of education: Not on file  . Highest education level: Not on file  Occupational History  . Not on file  Tobacco Use  . Smoking status: Never Smoker  . Smokeless tobacco: Never Used  Vaping Use  . Vaping Use: Never used  Substance and Sexual Activity  . Alcohol use: Yes    Comment: occ  . Drug use: Never  . Sexual activity: Not on  file  Other Topics Concern  . Not on file  Social History Narrative  . Not on file   Social Determinants of Health   Financial Resource Strain:   . Difficulty of Paying Living Expenses: Not on file  Food Insecurity:   . Worried About Charity fundraiser in the Last Year: Not on file  . Ran Out of Food in the Last Year: Not on file  Transportation Needs:   . Lack of Transportation (Medical): Not on file  . Lack of Transportation (Non-Medical): Not on file  Physical Activity:   . Days of Exercise per Week: Not on file  . Minutes of Exercise per Session: Not on file  Stress:   . Feeling of Stress : Not on file  Social Connections:   . Frequency of Communication with Friends and Family: Not on file  . Frequency of Social Gatherings with Friends and Family: Not on file   . Attends Religious Services: Not on file  . Active Member of Clubs or Organizations: Not on file  . Attends Archivist Meetings: Not on file  . Marital Status: Not on file    Review of Systems  Constitutional: Negative for chills and fever.  HENT: Positive for congestion. Negative for ear pain and sore throat.   Respiratory: Negative for cough and shortness of breath.   Cardiovascular: Negative for chest pain.  Gastrointestinal: Negative for abdominal pain, constipation, nausea and vomiting.     Objective:  BP 138/74   Pulse 80   Temp (!) 96.5 F (35.8 C)   Resp 18   Ht 6' (1.829 m)   Wt 195 lb (88.5 kg)   BMI 26.45 kg/m   BP/Weight 12/27/2019 11/22/2019 0/35/0093  Systolic BP 818 299 371  Diastolic BP 74 80 96  Wt. (Lbs) 195 191 194  BMI 26.45 25.9 26.31    Physical Exam Constitutional:      Appearance: Normal appearance.  HENT:     Right Ear: Tympanic membrane, ear canal and external ear normal.     Left Ear: Tympanic membrane, ear canal and external ear normal.     Nose: Congestion present. No rhinorrhea.     Comments: BL Maxillary sinus tenderness.    Mouth/Throat:     Mouth: Mucous membranes are moist.     Pharynx: No oropharyngeal exudate or posterior oropharyngeal erythema.  Cardiovascular:     Rate and Rhythm: Normal rate and regular rhythm.     Heart sounds: Normal heart sounds.  Pulmonary:     Effort: Pulmonary effort is normal. No respiratory distress.     Breath sounds: Normal breath sounds. No wheezing, rhonchi or rales.  Lymphadenopathy:     Cervical: No cervical adenopathy.  Neurological:     Mental Status: He is alert and oriented to person, place, and time.  Psychiatric:        Mood and Affect: Mood normal.        Behavior: Behavior normal.     Diabetic Foot Exam - Simple   No data filed       Lab Results  Component Value Date   WBC 5.6 11/22/2019   HGB 15.7 11/22/2019   HCT 47.7 11/22/2019   PLT 254 11/22/2019    GLUCOSE 87 11/22/2019   CHOL 244 (H) 11/22/2019   TRIG 146 11/22/2019   HDL 56 11/22/2019   LDLCALC 162 (H) 11/22/2019   ALT 27 11/22/2019   AST 31 11/22/2019   NA 139 11/22/2019  K 4.4 11/22/2019   CL 101 11/22/2019   CREATININE 1.04 11/22/2019   BUN 11 11/22/2019   CO2 24 11/22/2019   TSH 1.790 09/05/2019      Assessment & Plan:   1. Other acute recurrent sinusitis rx for avelox given.  Will treat for sinusitis. If no improvement, will  Order ct of sinuses.  In the meantime, will proceed with neurology referral for resistant, intractable, and worsening headaches.   2. Need for immunization against influenza - Flu Vaccine QUAD High Dose(Fluad)  3. Other headache syndrome  - Ambulatory referral to Neurology   Meds ordered this encounter  Medications  . moxifloxacin (AVELOX) 400 MG tablet    Sig: Take 1 tablet (400 mg total) by mouth daily.    Dispense:  10 tablet    Refill:  0    Orders Placed This Encounter  Procedures  . Flu Vaccine QUAD High Dose(Fluad)  . Ambulatory referral to Neurology     Follow-up: Return in about 2 months (around 02/26/2020) for fasting.  An After Visit Summary was printed and given to the patient.  [name] [facility] [phone]

## 2019-12-30 ENCOUNTER — Other Ambulatory Visit: Payer: Self-pay

## 2019-12-30 MED ORDER — MONTELUKAST SODIUM 10 MG PO TABS: 10.0000 mg | ORAL_TABLET | Freq: Every day | ORAL | 3 refills | Status: DC

## 2020-01-01 ENCOUNTER — Encounter: Payer: Self-pay | Admitting: Family Medicine

## 2020-01-31 ENCOUNTER — Encounter: Payer: Self-pay | Admitting: Neurology

## 2020-01-31 ENCOUNTER — Ambulatory Visit: Payer: 59 | Admitting: Neurology

## 2020-01-31 VITALS — BP 126/84 | HR 50 | Ht 72.0 in | Wt 193.3 lb

## 2020-01-31 DIAGNOSIS — G4452 New daily persistent headache (NDPH): Secondary | ICD-10-CM | POA: Diagnosis not present

## 2020-01-31 DIAGNOSIS — E663 Overweight: Secondary | ICD-10-CM

## 2020-01-31 DIAGNOSIS — R519 Headache, unspecified: Secondary | ICD-10-CM

## 2020-01-31 NOTE — Progress Notes (Signed)
Subjective:    Patient ID: Joe Arroyo is a 64 y.o. male.  HPI     Star Age, MD, PhD [facility] [address], [address] [place], [place] [address]  Dear Dr. Tobie Poet,   I saw your patient, Joe Arroyo, upon your kind request in my neurologic clinic today for initial consultation of his recurrent headaches.  The patient is accompanied by his father today.  As you know, Joe Arroyo is a 64 year old right-handed gentleman with an underlying medical history of hypertension, hyperlipidemia, kidney stones, sinus bradycardia, and mildly overweight state, who reports headaches for the past several years, at least 2 or 3 years.  In the past 2 years, he reports that his headaches have been daily or nearly daily.  He describes a pressure sensation in the frontal areas and also in the face.  He has not seen ENT.  He has been suspected to have chronic sinusitis and has been treated with antibiotics without change in his symptoms.  He does report clear drainage and congestion, typically year-round.  Sometimes he has postnasal drip and has coughing.  He reports sleeping fairly well.  He is not aware of any snoring.  He lives alone.  He is single.  He has woken up with a headache at times.  He does not typically have a one-sided throbbing headache.  He has been taking Advil on a daily basis, typically 2 pills daily.  He likes to drink caffeine in the form of sweet tea.  He drinks several servings per day and his father reports that he likes to drink sweet tea, not much in the way of water and patient agrees that he typically does not drink water. The patient stays at his father's place on Tuesdays typically.  He does not have a telltale history of migraines growing up but had intermittent headaches.  He does not drink alcohol on a regular basis and is a non-smoker.  He has not had any one-sided weakness or numbness or tingling or droopy face or slurring of speech.  He does not have  night to night nocturia or wake up gasping, no family history of sleep apnea.  He had a full eye examination this year.  He wears prescription eyeglasses, no significant visual symptoms. I reviewed your office note from 12/27/2019.  He has tried over-the-counter medication as well as meloxicam and most recently [employer]. You ordered a head CT without contrast in August 2021 but he did not have it, as insurance denied it. He has no significant nausea or vomiting.  His Past Medical History Is Significant For: Past Medical History:  Diagnosis Date  . Hyperlipidemia 05/14/2018  . Hypertension 05/14/2018  . Kidney stones   . Sinus bradycardia 05/14/2018    His Past Surgical History Is Significant For: Past Surgical History:  Procedure Laterality Date  . LAMINECTOMY    . MOLE REMOVAL    . PROSTATE BIOPSY    . TONSILLECTOMY AND ADENOIDECTOMY      His Family History Is Significant For: Family History  Problem Relation Age of Onset  . Hyperlipidemia Mother   . Aortic stenosis Mother   . Valvular heart disease Mother   . Colon cancer Mother   . Diabetes Mellitus II Mother   . Cancer - Colon Mother   . Cancer Mother   . Stroke Mother   . Cerebrovascular Accident Mother   . Diabetes Mother   . Hypertension Mother   . Hyperlipidemia Father   .  Diabetes Mellitus II Father   . Diabetes Father   . CAD Maternal Grandmother   . Congestive Heart Failure Maternal Grandmother   . Heart attack Maternal Grandmother   . Heart failure Maternal Grandmother   . Coronary artery disease Maternal Grandmother   . Hyperlipidemia Maternal Grandmother     His Social History Is Significant For: Social History   Socioeconomic History  . Marital status: Single    Spouse name: Not on file  . Number of children: Not on file  . Years of education: Not on file  . Highest education level: Not on file  Occupational History  . Not on file  Tobacco Use  . Smoking status: Never Smoker  . Smokeless tobacco:  Never Used  Vaping Use  . Vaping Use: Never used  Substance and Sexual Activity  . Alcohol use: Yes    Comment: occ  . Drug use: Never  . Sexual activity: Not on file  Other Topics Concern  . Not on file  Social History Narrative  . Not on file   Social Determinants of Health   Financial Resource Strain:   . Difficulty of Paying Living Expenses: Not on file  Food Insecurity:   . Worried About Charity fundraiser in the Last Year: Not on file  . Ran Out of Food in the Last Year: Not on file  Transportation Needs:   . Lack of Transportation (Medical): Not on file  . Lack of Transportation (Non-Medical): Not on file  Physical Activity:   . Days of Exercise per Week: Not on file  . Minutes of Exercise per Session: Not on file  Stress:   . Feeling of Stress : Not on file  Social Connections:   . Frequency of Communication with Friends and Family: Not on file  . Frequency of Social Gatherings with Friends and Family: Not on file  . Attends Religious Services: Not on file  . Active Member of Clubs or Organizations: Not on file  . Attends Archivist Meetings: Not on file  . Marital Status: Not on file    His Allergies Are:  Allergies  Allergen Reactions  . Bystolic [Nebivolol Hcl] Other (See Comments)    bradycardia  . Carvedilol Other (See Comments)    bradycardia  . Pravastatin Other (See Comments)    "made my joints hurt"  . Hctz [Hydrochlorothiazide] Rash  . Hydralazine Rash  . Penicillins Rash  :   His Current Medications Are:  Outpatient Encounter Medications as of 01/31/2020  Medication Sig  . amLODipine (NORVASC) 5 MG tablet Take 1 tablet (5 mg total) by mouth daily.  . cloNIDine (CATAPRES) 0.3 MG tablet Take 1 tablet (0.3 mg total) by mouth daily.  [name] esomeprazole (NEXIUM) 20 MG capsule Take 20 mg by mouth daily at 12 noon.  . ezetimibe (ZETIA) 10 MG tablet Take 1 tablet (10 mg total) by mouth daily.  . finasteride (PROSCAR) 5 MG tablet Take 1  tablet by mouth daily.  . meloxicam (MOBIC) 15 MG tablet Take 1 tablet (15 mg total) by mouth daily as needed.  . montelukast (SINGULAIR) 10 MG tablet Take 1 tablet (10 mg total) by mouth at bedtime.  . moxifloxacin (AVELOX) 400 MG tablet Take 1 tablet (400 mg total) by mouth daily.  . Multiple Vitamins-Minerals (CENTRUM SILVER ULTRA MENS PO) Take 1 tablet by mouth daily.  . Omega-3 Fatty Acids (FISH OIL PO) Take 1 tablet by mouth 2 (two) times daily.  . [DISCONTINUED]  telmisartan (MICARDIS) 40 MG tablet Take 1 tablet (40 mg total) by mouth 2 (two) times daily. (Patient not taking: Reported on 01/31/2020)   No facility-administered encounter medications on file as of 01/31/2020.  :   Review of Systems:  Out of a complete 14 point review of systems, all are reviewed and negative with the exception of these symptoms as listed below:  Review of Systems  Neurological:       Here to discuss worsening daily h/a. Pt reports he has a dull aching headache daily. He reports once there is a change in pressure with the weather his h/a severity increases. He typically take otc Advil to help with sx.  Epworth Sleepiness Scale 0= would never doze 1= slight chance of dozing 2= moderate chance of dozing 3= high chance of dozing  Sitting and reading:0 Watching TV:0 Sitting inactive in a public place (ex. Theater or meeting):0 As a passenger in a car for an hour without a break:0 Lying down to rest in the afternoon:0 Sitting and talking to someone:0 Sitting quietly after lunch (no alcohol):0 In a car, while stopped in traffic:0 Total:0     Objective:  Neurological Exam  Physical Exam Physical Examination:   Vitals:   01/31/20 1459  BP: 126/84  Pulse: (!) 50   General Examination: The patient is a very pleasant 64 y.o. male in no acute distress. He appears well-developed and well-nourished and well groomed.   HEENT: Normocephalic, atraumatic, pupils are equal, round and reactive to  light and accommodation. Funduscopic exam is normal with sharp disc margins noted. Extraocular tracking is good without limitation to gaze excursion or nystagmus noted. Normal smooth pursuit is noted. Hearing is grossly intact. Face is symmetric with normal facial animation and normal facial sensation. Speech is clear with no dysarthria noted. There is no hypophonia. There is no lip, neck/head, jaw or voice tremor. Neck is supple with full range of passive and active motion. There are no carotid bruits on auscultation. Oropharynx exam reveals: mild mouth dryness, good dental hygiene and mild airway crowding, due to small airway entry and somewhat redundant soft palate, Mallampati class II, tonsils absent.  Tongue protrudes centrally in palate elevates symmetrically.  Chest: Clear to auscultation without wheezing, rhonchi or crackles noted.  Heart: S1+S2+0, regular and normal without murmurs, rubs or gallops noted.   Abdomen: Soft, non-tender and non-distended with normal bowel sounds appreciated on auscultation.  Extremities: There is no pitting edema in the distal lower extremities bilaterally. Pedal pulses are intact.  Skin: Warm and dry without trophic changes noted. There are no varicose veins.  Musculoskeletal: exam reveals no obvious joint deformities, tenderness or joint swelling or erythema.   Neurologically:  Mental status: The patient is awake, alert and oriented in all 4 spheres. His immediate and remote memory, attention, language skills and fund of knowledge are appropriate. There is no evidence of aphasia, agnosia, apraxia or anomia. Speech is clear with normal prosody and enunciation. Thought process is linear. Mood is normal and affect is normal.  Cranial nerves II - XII are as described above under HEENT exam. In addition: shoulder shrug is normal with equal shoulder height noted. Motor exam: Normal bulk, strength and tone is noted. There is no drift, tremor or rebound. Romberg is  negative. Reflexes are 1+ throughout. Babinski: Toes are flexor bilaterally. Fine motor skills and coordination: intact with normal finger taps, normal hand movements, normal rapid alternating patting, normal foot taps and normal foot agility.  Cerebellar testing: No  dysmetria or intention tremor on finger to nose testing. Heel to shin is unremarkable bilaterally. There is no truncal or gait ataxia.  Sensory exam: intact to light touch, vibration, temperature sense in the upper and lower extremities.  Gait, station and balance: He stands easily. No veering to one side is noted. No leaning to one side is noted. Posture is age-appropriate and stance is narrow based. Gait shows normal stride length and normal pace. No problems turning are noted. Tandem walk is difficult for him and he has to make corrective steps twice.   Assessment and Plan:   In summary, Joe Arroyo is a very pleasant 64 y.o.-year old male with an underlying medical history of hypertension, hyperlipidemia, kidney stones, sinus bradycardia, and mildly overweight state, who presents for evaluation of his recurrent headaches of at least 3 years duration, nearly daily in the past 2 years.  He has been taking Advil on a daily or nearly daily basis.  His history is not telltale for migrainous headaches.  Tension headaches and sinus related headaches may be at play, and contributing factors may include caffeine related headaches and medication overuse related headaches.  I would like to proceed with a brain MRI with and without contrast to rule out a structural cause of his symptoms and a home sleep test to look for the possibility of underlying obstructive sleep disordered breathing as he does have some morning headaches.  Airway is on the smaller side.  He is mildly overweight as well.  We talked about these issues.  He is agreeable to pursuing further testing to rule out an underlying organic cause of his symptoms and new onset headaches after  age 62 without classic history of migraines in the past.  He is advised to stay better hydrated with water and increase his water intake, decrease his tea intake to limit himself to 1 or 2 servings per day eventually and he is advised to avoid taking NSAIDs on a daily basis as even over-the-counter pain medications can perpetuate headaches.  His physical exam and neurological exam are nonfocal.  He had a formal eye examination also this year.  These are reassuring findings.  Nevertheless, further evaluation is indicated and we will keep him posted as to his test results.  We talked about utilizing preventative medication such as amitriptyline or gabapentin as potential options.  We will pick up our discussion once we have his test done and proceed from there.  He is advised to follow-up after testing and we will call him also in the interim.  I answered all their questions today and the patient and his father were in agreement.   Thank you very much for allowing me to participate in the care of this nice patient. If I can be of any further assistance to you please do not hesitate to call me at [phone].  Sincerely,   Star Age, MD, PhD

## 2020-01-31 NOTE — Patient Instructions (Signed)
It was nice to meet you both today.   Here is what we discussed regarding her headaches.  I would like for [place] to proceed with a brain MRI with and without contrast to rule out a structural cause of your recurrent headaches as you do not have a telltale history of migraines growing up.  I would also like to look into sleep testing with a home sleep test to see if you may have underlying obstructive sleep apnea although your history is not compelling for underlying sleep apnea.  Nevertheless, I think it would be wise to rule out an organic cause of your recurrent headaches.  We will talk about headache preventative medication such as gabapentin or utilizing a medication called amitriptyline once we have testing done.  In the meantime, please work on better hydration with water, try to increase her water intake to about 6 to 8 cups of water per day on average.  Try to gradually reduce your sweet tea intake to limit yourself to 1 or 2 servings per day.  Please also keep in mind that daily use of nonsteroidal anti-inflammatory medication such as Advil, even over-the-counter medication can perpetuate recurrent headaches.  Please try to avoid taking Advil or similar medication such as ibuprofen or naproxen on a daily basis.

## 2020-02-01 ENCOUNTER — Telehealth: Payer: Self-pay | Admitting: Neurology

## 2020-02-01 NOTE — Telephone Encounter (Signed)
[employer] pending

## 2020-02-02 ENCOUNTER — Telehealth: Payer: Self-pay

## 2020-02-02 NOTE — Telephone Encounter (Signed)
schedule is scheduled at [facility] for Tues 02/07/20 at 10:15 Am patient is aware

## 2020-02-02 NOTE — Telephone Encounter (Signed)
Pt requesting mailout HST due to work schedule

## 2020-02-07 ENCOUNTER — Other Ambulatory Visit: Payer: Self-pay

## 2020-02-07 ENCOUNTER — Other Ambulatory Visit: Payer: Self-pay | Admitting: Family Medicine

## 2020-02-07 DIAGNOSIS — R519 Headache, unspecified: Secondary | ICD-10-CM

## 2020-02-07 DIAGNOSIS — E663 Overweight: Secondary | ICD-10-CM

## 2020-02-07 DIAGNOSIS — G4452 New daily persistent headache (NDPH): Secondary | ICD-10-CM

## 2020-02-07 MED ORDER — DIAZEPAM 2 MG PO TABS: ORAL_TABLET | ORAL | 0 refills | Status: DC

## 2020-02-09 ENCOUNTER — Other Ambulatory Visit: Payer: Self-pay

## 2020-02-09 MED ORDER — EZETIMIBE 10 MG PO TABS: 10.0000 mg | ORAL_TABLET | Freq: Every day | ORAL | 1 refills | Status: DC

## 2020-02-15 ENCOUNTER — Telehealth: Payer: Self-pay

## 2020-02-15 NOTE — Telephone Encounter (Signed)
I called pt. I discussed his MRI results and recommendations. Pt will complete sleep study and follow up as discussed afterwards. Pt verbalized understanding of results. Pt had no questions at this time but was encouraged to call back if questions arise.

## 2020-02-15 NOTE — Telephone Encounter (Signed)
Received MRI results dated 02/14/2020 from [facility]. Given to Dr. Rexene Alberts for review.

## 2020-02-15 NOTE — Telephone Encounter (Signed)
I received patient's brain MRI report from [facility] in [place], [place], study date 02/07/2020.  He had a brain MRI with and without contrast.  Impression: No acute or reversible finding.  No specific cause of headache identified.  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  Differential diagnosis does include demyelinating disease and migraine related foci.  Please call patient and advise him that his recent brain MRI with and without contrast did not show any acute or reversible findings and no cause for his headaches.

## 2020-02-20 ENCOUNTER — Ambulatory Visit (INDEPENDENT_AMBULATORY_CARE_PROVIDER_SITE_OTHER): Payer: 59 | Admitting: Neurology

## 2020-02-20 DIAGNOSIS — G4733 Obstructive sleep apnea (adult) (pediatric): Secondary | ICD-10-CM

## 2020-02-20 DIAGNOSIS — E663 Overweight: Secondary | ICD-10-CM

## 2020-02-20 DIAGNOSIS — R519 Headache, unspecified: Secondary | ICD-10-CM

## 2020-02-20 DIAGNOSIS — G4452 New daily persistent headache (NDPH): Secondary | ICD-10-CM

## 2020-02-27 ENCOUNTER — Telehealth: Payer: Self-pay

## 2020-02-27 NOTE — Telephone Encounter (Signed)
-----   Message from Star Age, MD sent at 02/27/2020  9:22 AM EST ----- Patient referred by Dr. Tobie Poet for HAs, seen by me on 01/31/20, HST on 02/22/20.    Please call and notify the patient that the recent home sleep test showed obstructive sleep apnea in the moderate range. I recommend treatment in the form of autoPAP, which means, that we don't have to bring him in for a sleep study with CPAP, but will let him start using a so called autoPAP machine at home, through a DME company (of his choice, or as per insurance requirement). The DME representative will educate him on how to use the machine, how to put the mask on, etc. I have placed an order in the chart. Please send referral, talk to patient, send report to referring MD. We will need a FU in sleep clinic for 10 weeks post-PAP set up, please arrange that with me or one of our NPs. Thanks,   Star Age, MD, PhD [facility])

## 2020-02-27 NOTE — Telephone Encounter (Signed)
I called Joe Arroyo. I advised Joe Arroyo that Dr. Rexene Alberts reviewed their sleep study results and found that Joe Arroyo has moderate osa. Dr. Rexene Alberts recommends that Joe Arroyo start an auto pap at home. I reviewed PAP compliance expectations with the Joe Arroyo. Joe Arroyo is agreeable to starting an auto-PAP. I advised Joe Arroyo that an order will be sent to a DME, [facility], and Huey Romans will call the Joe Arroyo within about one week after they file with the Joe Arroyo's insurance. Huey Romans will show the Joe Arroyo how to use the machine, fit for masks, and troubleshoot the auto-PAP if needed. A follow up appt was made for insurance purposes with Dr. Rexene Alberts on 06/06/20 at 1:00pm. Joe Arroyo verbalized understanding to arrive 15 minutes early and bring their auto-PAP. A letter with all of this information in it will be mailed to the Joe Arroyo as a reminder. I verified with the Joe Arroyo that the address we have on file is correct. Joe Arroyo verbalized understanding of results. Joe Arroyo had no questions at this time but was encouraged to call back if questions arise. I have sent the order to [facility] and have received confirmation that they have received the order.

## 2020-02-27 NOTE — Procedures (Signed)
   [facility]  HOME SLEEP TEST (Watch PAT)  STUDY DATE: 02/20/20  DOB: [birth year]  MRN: [id number]  ORDERING CLINICIAN: Star Age, MD, PhD   REFERRING CLINICIAN: Cox, Kirsten, MD   CLINICAL INFORMATION/HISTORY: 64 year old right-handed gentleman with an underlying medical history of hypertension, hyperlipidemia, kidney stones, sinus bradycardia, and mildly overweight state, who reports headaches for the past several years, at least 2 or 3 years. He has woken up with a headache at times.   Epworth sleepiness score: 0/24.  BMI: 26.3 kg/m  FINDINGS:   Total Record Time (hours, min): 7hr,24min  Total Sleep Time (hours, min):  7hr,110min   Percent REM (%):    23.9   Calculated pAHI (per hour):  15.6     REM pAHI:    31.8   NREM pAHI: 10.5   Oxygen Saturation (%) Mean: 93 Minimum oxygen saturation (%):                 85   O2 Saturation Range (%): 85 - 98  O2Saturation (minutes) <=88%: 2.0  Pulse Mean (bpm):    55 Pulse Range 40-84  IMPRESSION: OSA (obstructive sleep apnea), moderate  RECOMMENDATION:  This home sleep test demonstrates moderate obstructive sleep apnea with a total AHI of 15.6/hour and O2 nadir of 85%. Treatment with positive airway pressure is recommended. The patient will be advised to proceed with an autoPAP titration/trial at home for now. A full night titration study may be considered to optimize treatment settings, if needed down the road. Please note that untreated obstructive sleep apnea may carry additional perioperative morbidity. Patients with significant obstructive sleep apnea should receive perioperative PAP therapy and the surgeons and particularly the anesthesiologist should be informed of the diagnosis and the severity of the sleep disordered breathing. The patient should be cautioned not to drive, work at heights, or operate dangerous or heavy equipment when tired or sleepy. Review and reiteration of good sleep hygiene measures  should be pursued with any patient. Other causes of the patient's symptoms, including circadian rhythm disturbances, an underlying mood disorder, medication effect and/or an underlying medical problem cannot be ruled out based on this test. Clinical correlation is recommended. The patient and his referring provider will be notified of the test results. The patient will be seen in follow up in sleep clinic at [facility].  I certify that I have reviewed the raw data recording prior to the issuance of this report in accordance with the standards of the American Academy of Sleep Medicine (AASM).  INTERPRETING PHYSICIAN:    Star Age, MD, PhD  Board Certified in Neurology and Sleep Medicine  [facility] [address], [place], [place] [address] [phone]

## 2020-02-27 NOTE — Progress Notes (Signed)
Patient referred by Dr. Tobie Poet for HAs, seen by me on 01/31/20, HST on 02/22/20.    Please call and notify the patient that the recent home sleep test showed obstructive sleep apnea in the moderate range. I recommend treatment in the form of autoPAP, which means, that we don't have to bring him in for a sleep study with CPAP, but will let him start using a so called autoPAP machine at home, through a DME company (of his choice, or as per insurance requirement). The DME representative will educate him on how to use the machine, how to put the mask on, etc. I have placed an order in the chart. Please send referral, talk to patient, send report to referring MD. We will need a FU in sleep clinic for 10 weeks post-PAP set up, please arrange that with me or one of our NPs. Thanks,   Star Age, MD, PhD [facility])

## 2020-02-27 NOTE — Addendum Note (Signed)
Addended by: Star Age on: 02/27/2020 09:22 AM   Modules accepted: Orders

## 2020-03-01 ENCOUNTER — Telehealth: Payer: Self-pay | Admitting: *Deleted

## 2020-03-01 NOTE — Telephone Encounter (Signed)
Received call from [place] with [employer]. She stated Dr Guadelupe Sabin CPAP setting order is 2-12. The lowest setting is 4. She'll send over new orders for Dr Rexene Alberts to sign.

## 2020-03-01 NOTE — Telephone Encounter (Signed)
Received new CPAP order sheet form [facility]. Placed on MD desk for review, signature.

## 2020-03-01 NOTE — Telephone Encounter (Signed)
New CPAP orders signed and successfully faxed back to [employer].

## 2020-03-05 ENCOUNTER — Other Ambulatory Visit: Payer: Self-pay

## 2020-03-05 MED ORDER — CLONIDINE HCL 0.3 MG PO TABS
0.3000 mg | ORAL_TABLET | Freq: Every day | ORAL | 1 refills | Status: DC
Start: 2020-03-05 — End: 2020-08-31

## 2020-03-06 ENCOUNTER — Other Ambulatory Visit: Payer: Self-pay

## 2020-03-06 ENCOUNTER — Ambulatory Visit: Payer: 59 | Admitting: Family Medicine

## 2020-03-06 ENCOUNTER — Encounter: Payer: Self-pay | Admitting: Family Medicine

## 2020-03-06 VITALS — BP 124/82 | HR 72 | Temp 97.3°F | Ht 72.0 in | Wt 193.0 lb

## 2020-03-06 DIAGNOSIS — R519 Headache, unspecified: Secondary | ICD-10-CM

## 2020-03-06 DIAGNOSIS — E782 Mixed hyperlipidemia: Secondary | ICD-10-CM

## 2020-03-06 DIAGNOSIS — I10 Essential (primary) hypertension: Secondary | ICD-10-CM

## 2020-03-06 DIAGNOSIS — G4733 Obstructive sleep apnea (adult) (pediatric): Secondary | ICD-10-CM

## 2020-03-06 NOTE — Progress Notes (Signed)
Subjective:  Patient ID: Joe Arroyo, male    DOB: 07/07/1955  Age: 64 y.o. MRN: [id number]  Chief Complaint  Patient presents with  . Hypertension  . Hyperlipidemia    HPI Hypertension: amlodipine 5 mg daily and clonidine 0.3 mg daily both at night.  Hyperlipidemia - taking zetia. Pravastatin caused joint pain. On fish oil one twice a day. GERD: nexium 20 mg daily. OSA - new dx. Pt prefers to wait on cpap. Persistent headaches daily (low grade.) seeing neurology.  Allergic rhinitis: singulair 10 mg once daily. Does not help headaches. Has tried combined with zyrtec, but no help.  BPH: Proscar daily.   Current Outpatient Medications on File Prior to Visit  Medication Sig Dispense Refill  . amLODipine (NORVASC) 5 MG tablet Take 1 tablet (5 mg total) by mouth daily. 90 tablet 1  . cloNIDine (CATAPRES) 0.3 MG tablet Take 1 tablet (0.3 mg total) by mouth daily. 90 tablet 1  . esomeprazole (NEXIUM) 20 MG capsule Take 20 mg by mouth daily at 12 noon.    . ezetimibe (ZETIA) 10 MG tablet Take 1 tablet (10 mg total) by mouth daily. 90 tablet 1  . finasteride (PROSCAR) 5 MG tablet Take 1 tablet by mouth daily.    . meloxicam (MOBIC) 15 MG tablet Take 1 tablet (15 mg total) by mouth daily as needed. 90 tablet 0  . montelukast (SINGULAIR) 10 MG tablet Take 1 tablet (10 mg total) by mouth at bedtime. 90 tablet 3  . Multiple Vitamins-Minerals (CENTRUM SILVER ULTRA MENS PO) Take 1 tablet by mouth daily.    . Omega-3 Fatty Acids (FISH OIL PO) Take 1 tablet by mouth 2 (two) times daily.     No current facility-administered medications on file prior to visit.   Past Medical History:  Diagnosis Date  . Hyperlipidemia 05/14/2018  . Hypertension 05/14/2018  . Kidney stones   . Sinus bradycardia 05/14/2018   Past Surgical History:  Procedure Laterality Date  . LAMINECTOMY    . MOLE REMOVAL    . PROSTATE BIOPSY    . TONSILLECTOMY AND ADENOIDECTOMY      Family History  Problem Relation Age of  Onset  . Hyperlipidemia Mother   . Aortic stenosis Mother   . Valvular heart disease Mother   . Colon cancer Mother   . Diabetes Mellitus II Mother   . Cancer - Colon Mother   . Cancer Mother   . Stroke Mother   . Cerebrovascular Accident Mother   . Diabetes Mother   . Hypertension Mother   . Hyperlipidemia Father   . Diabetes Mellitus II Father   . Diabetes Father   . CAD Maternal Grandmother   . Congestive Heart Failure Maternal Grandmother   . Heart attack Maternal Grandmother   . Heart failure Maternal Grandmother   . Coronary artery disease Maternal Grandmother   . Hyperlipidemia Maternal Grandmother    Social History   Socioeconomic History  . Marital status: Single    Spouse name: Not on file  . Number of children: Not on file  . Years of education: Not on file  . Highest education level: Not on file  Occupational History  . Not on file  Tobacco Use  . Smoking status: Never Smoker  . Smokeless tobacco: Never Used  Vaping Use  . Vaping Use: Never used  Substance and Sexual Activity  . Alcohol use: Yes    Comment: occ  . Drug use: Never  . Sexual activity: Not  on file  Other Topics Concern  . Not on file  Social History Narrative  . Not on file   Social Determinants of Health   Financial Resource Strain:   . Difficulty of Paying Living Expenses: Not on file  Food Insecurity:   . Worried About Charity fundraiser in the Last Year: Not on file  . Ran Out of Food in the Last Year: Not on file  Transportation Needs:   . Lack of Transportation (Medical): Not on file  . Lack of Transportation (Non-Medical): Not on file  Physical Activity:   . Days of Exercise per Week: Not on file  . Minutes of Exercise per Session: Not on file  Stress:   . Feeling of Stress : Not on file  Social Connections:   . Frequency of Communication with Friends and Family: Not on file  . Frequency of Social Gatherings with Friends and Family: Not on file  . Attends Religious  Services: Not on file  . Active Member of Clubs or Organizations: Not on file  . Attends Archivist Meetings: Not on file  . Marital Status: Not on file    Review of Systems  Constitutional: Positive for fatigue. Negative for chills and fever.  HENT: Positive for congestion. Negative for rhinorrhea and sore throat.   Respiratory: Positive for shortness of breath. Negative for cough.   Cardiovascular: Negative for chest pain and palpitations.  Gastrointestinal: Negative for abdominal pain, constipation, diarrhea, nausea and vomiting.  Genitourinary: Negative for dysuria and urgency.  Musculoskeletal: Negative for arthralgias, back pain and myalgias.  Neurological: Positive for headaches. Negative for dizziness.  Psychiatric/Behavioral: Negative for dysphoric mood. The patient is not nervous/anxious.      Objective:  BP 124/82   Pulse 72   Temp (!) 97.3 F (36.3 C)   Ht 6' (1.829 m)   Wt 193 lb (87.5 kg)   BMI 26.18 kg/m   BP/Weight 03/06/2020 01/31/2020 2/63/7858  Systolic BP 850 277 412  Diastolic BP 82 84 74  Wt. (Lbs) 193 193.31 195  BMI 26.18 26.22 26.45    Physical Exam Constitutional:      Appearance: Normal appearance.  HENT:     Right Ear: Tympanic membrane, ear canal and external ear normal.     Left Ear: Tympanic membrane, ear canal and external ear normal.     Nose: Nose normal. No congestion or rhinorrhea.     Mouth/Throat:     Mouth: Mucous membranes are moist.     Pharynx: No oropharyngeal exudate or posterior oropharyngeal erythema.  Cardiovascular:     Rate and Rhythm: Normal rate and regular rhythm.     Heart sounds: Normal heart sounds.  Pulmonary:     Effort: Pulmonary effort is normal. No respiratory distress.     Breath sounds: Normal breath sounds. No wheezing, rhonchi or rales.  Abdominal:     General: Bowel sounds are normal.     Palpations: Abdomen is soft.     Tenderness: There is no abdominal tenderness.  Lymphadenopathy:      Cervical: No cervical adenopathy.  Neurological:     Mental Status: He is alert.  Psychiatric:        Mood and Affect: Mood normal.        Behavior: Behavior normal.     Diabetic Foot Exam - Simple   No data filed       Lab Results  Component Value Date   WBC 5.6 11/22/2019   HGB  15.7 11/22/2019   HCT 47.7 11/22/2019   PLT 254 11/22/2019   GLUCOSE 87 11/22/2019   CHOL 244 (H) 11/22/2019   TRIG 146 11/22/2019   HDL 56 11/22/2019   LDLCALC 162 (H) 11/22/2019   ALT 27 11/22/2019   AST 31 11/22/2019   NA 139 11/22/2019   K 4.4 11/22/2019   CL 101 11/22/2019   CREATININE 1.04 11/22/2019   BUN 11 11/22/2019   CO2 24 11/22/2019   TSH 1.790 09/05/2019      Assessment & Plan:   1. Recurrent headache Likely due to OSA. Recommend pt call neurology to see if they have other suggestions since he is against starting cpap.   2. Essential hypertension Well controlled.  No changes to medicines.  Continue to work on eating a healthy diet and exercise.  Labs drawn today.  - CBC with Differential/Platelet - Comprehensive metabolic panel  3. Mixed hyperlipidemia Continue zetia.  Strongly recommended statin.  - Lipid panel  4. OSA (obstructive sleep apnea)  Encouraged cpap USE.    Orders Placed This Encounter  Procedures  . CBC with Differential/Platelet  . Comprehensive metabolic panel  . Lipid panel    I spent 30 minutes dedicated to the care of this patient on the date of this encounter to include face-to-face time with the patient, as well as: reviewing medical records, education concerning OSA.   Follow-up: Return in about 3 months (around 06/06/2020) for fasting.  An After Visit Summary was printed and given to the patient.  [name], MD [facility] [phone]

## 2020-03-06 NOTE — Patient Instructions (Signed)
High Cholesterol  High cholesterol is a condition in which the blood has high levels of a white, waxy, fat-like substance (cholesterol). The human body needs small amounts of cholesterol. The liver makes all the cholesterol that the body needs. Extra (excess) cholesterol comes from the food that we eat. Cholesterol is carried from the liver by the blood through the blood vessels. If you have high cholesterol, deposits (plaques) may build up on the walls of your blood vessels (arteries). Plaques make the arteries narrower and stiffer. Cholesterol plaques increase your risk for heart attack and stroke. Work with your health care provider to keep your cholesterol levels in a healthy range. What increases the risk? This condition is more likely to develop in people who:  Eat foods that are high in animal fat (saturated fat) or cholesterol.  Are overweight.  Are not getting enough exercise.  Have a family history of high cholesterol. What are the signs or symptoms? There are no symptoms of this condition. How is this diagnosed? This condition may be diagnosed from the results of a blood test.  If you are older than age 20, your health care provider may check your cholesterol every 4-6 years.  You may be checked more often if you already have high cholesterol or other risk factors for heart disease. The blood test for cholesterol measures:  "Bad" cholesterol (LDL cholesterol). This is the main type of cholesterol that causes heart disease. The desired level for LDL is less than 100.  "Good" cholesterol (HDL cholesterol). This type helps to protect against heart disease by cleaning the arteries and carrying the LDL away. The desired level for HDL is 60 or higher.  Triglycerides. These are fats that the body can store or burn for energy. The desired number for triglycerides is lower than 150.  Total cholesterol. This is a measure of the total amount of cholesterol in your blood, including LDL  cholesterol, HDL cholesterol, and triglycerides. A healthy number is less than 200. How is this treated? This condition is treated with diet changes, lifestyle changes, and medicines. Diet changes  This may include eating more whole grains, fruits, vegetables, nuts, and fish.  This may also include cutting back on red meat and foods that have a lot of added sugar. Lifestyle changes  Changes may include getting at least 40 minutes of aerobic exercise 3 times a week. Aerobic exercises include walking, biking, and swimming. Aerobic exercise along with a healthy diet can help you maintain a healthy weight.  Changes may also include quitting smoking. Medicines  Medicines are usually given if diet and lifestyle changes have failed to reduce your cholesterol to healthy levels.  Your health care provider may prescribe a statin medicine. Statin medicines have been shown to reduce cholesterol, which can reduce the risk of heart disease. Follow these instructions at home: Eating and drinking If told by your health care provider:  Eat chicken (without skin), fish, veal, shellfish, ground turkey breast, and round or loin cuts of red meat.  Do not eat fried foods or fatty meats, such as hot dogs and salami.  Eat plenty of fruits, such as apples.  Eat plenty of vegetables, such as broccoli, potatoes, and carrots.  Eat beans, peas, and lentils.  Eat grains such as barley, rice, couscous, and bulgur wheat.  Eat pasta without cream sauces.  Use skim or nonfat milk, and eat low-fat or nonfat yogurt and cheeses.  Do not eat or drink whole milk, cream, ice cream, egg yolks,   or hard cheeses.  Do not eat stick margarine or tub margarines that contain trans fats (also called partially hydrogenated oils).  Do not eat saturated tropical oils, such as coconut oil and palm oil.  Do not eat cakes, cookies, crackers, or other baked goods that contain trans fats.  General instructions  Exercise as  directed by your health care provider. Increase your activity level with activities such as gardening, walking, and taking the stairs.  Take over-the-counter and prescription medicines only as told by your health care provider.  Do not use any products that contain nicotine or tobacco, such as cigarettes and e-cigarettes. If you need help quitting, ask your health care provider.  Keep all follow-up visits as told by your health care provider. This is important. Contact a health care provider if:  You are struggling to maintain a healthy diet or weight.  You need help to start on an exercise program.  You need help to stop smoking. Get help right away if:  You have chest pain.  You have trouble breathing. This information is not intended to replace advice given to you by your health care provider. Make sure you discuss any questions you have with your health care provider. Document Revised: 04/03/2017 Document Reviewed: 09/29/2015 Elsevier Patient Education  [place].    Sleep Apnea Sleep apnea affects breathing during sleep. It causes breathing to stop for a short time or to become shallow. It can also increase the risk of:  Heart attack.  Stroke.  Being very overweight (obese).  Diabetes.  Heart failure.  Irregular heartbeat. The goal of treatment is to help you breathe normally again. What are the causes? There are three kinds of sleep apnea:  Obstructive sleep apnea. This is caused by a blocked or collapsed airway.  Central sleep apnea. This happens when the brain does not send the right signals to the muscles that control breathing.  Mixed sleep apnea. This is a combination of obstructive and central sleep apnea. The most common cause of this condition is a collapsed or blocked airway. This can happen if:  Your throat muscles are too relaxed.  Your tongue and tonsils are too large.  You are overweight.  Your airway is too small. What increases  the risk?  Being overweight.  Smoking.  Having a small airway.  Being older.  Being male.  Drinking alcohol.  Taking medicines to calm yourself (sedatives or tranquilizers).  Having family members with the condition. What are the signs or symptoms?  Trouble staying asleep.  Being sleepy or tired during the day.  Getting angry a lot.  Loud snoring.  Headaches in the morning.  Not being able to focus your mind (concentrate).  Forgetting things.  Less interest in sex.  Mood swings.  Personality changes.  Feelings of sadness (depression).  Waking up a lot during the night to pee (urinate).  Dry mouth.  Sore throat. How is this diagnosed?  Your medical history.  A physical exam.  A test that is done when you are sleeping (sleep study). The test is most often done in a sleep lab but may also be done at home. How is this treated?   Sleeping on your side.  Using a medicine to get rid of mucus in your nose (decongestant).  Avoiding the use of alcohol, medicines to help you relax, or certain pain medicines (narcotics).  Losing weight, if needed.  Changing your diet.  Not smoking.  Using a machine to open your airway  while you sleep, such as: ? An oral appliance. This is a mouthpiece that shifts your lower jaw forward. ? A CPAP device. This device blows air through a mask when you breathe out (exhale). ? An EPAP device. This has valves that you put in each nostril. ? A BPAP device. This device blows air through a mask when you breathe in (inhale) and breathe out.  Having surgery if other treatments do not work. It is important to get treatment for sleep apnea. Without treatment, it can lead to:  High blood pressure.  Coronary artery disease.  In men, not being able to have an erection (impotence).  Reduced thinking ability. Follow these instructions at home: Lifestyle  Make changes that your doctor recommends.  Eat a healthy diet.  Lose  weight if needed.  Avoid alcohol, medicines to help you relax, and some pain medicines.  Do not use any products that contain nicotine or tobacco, such as cigarettes, e-cigarettes, and chewing tobacco. If you need help quitting, ask your doctor. General instructions  Take over-the-counter and prescription medicines only as told by your doctor.  If you were given a machine to use while you sleep, use it only as told by your doctor.  If you are having surgery, make sure to tell your doctor you have sleep apnea. You may need to bring your device with you.  Keep all follow-up visits as told by your doctor. This is important. Contact a doctor if:  The machine that you were given to use during sleep bothers you or does not seem to be working.  You do not get better.  You get worse. Get help right away if:  Your chest hurts.  You have trouble breathing in enough air.  You have an uncomfortable feeling in your back, arms, or stomach.  You have trouble talking.  One side of your body feels weak.  A part of your face is hanging down. These symptoms may be an emergency. Do not wait to see if the symptoms will go away. Get medical help right away. Call your local emergency services (911 in the [place] Do not drive yourself to the hospital. Summary  This condition affects breathing during sleep.  The most common cause is a collapsed or blocked airway.  The goal of treatment is to help you breathe normally while you sleep. This information is not intended to replace advice given to you by your health care provider. Make sure you discuss any questions you have with your health care provider. Document Revised: 01/15/2018 Document Reviewed: 11/24/2017 Elsevier Patient Education  2020 [place] With Sleep Apnea Sleep apnea is a condition in which breathing pauses or becomes shallow during sleep. Sleep apnea is most commonly caused by a collapsed or blocked airway. People with  sleep apnea snore loudly and have times when they gasp and stop breathing for 10 seconds or more during sleep. This happens over and over during the night. This disrupts your sleep and keeps your body from getting the rest that it needs, which can cause tiredness and lack of energy (fatigue) during the day. The breaks in breathing also interrupt the deep sleep that you need to feel rested. Even if you do not completely wake up from the gaps in breathing, your sleep may not be restful. You may also have a headache in the morning and low energy during the day, and you may feel anxious or depressed. How can sleep apnea affect me? Sleep apnea increases your  chances of extreme tiredness during the day (daytime fatigue). It can also increase your risk for health conditions, such as:  Heart attack.  Stroke.  Diabetes.  Heart failure.  Irregular heartbeat.  High blood pressure. If you have daytime fatigue as a result of sleep apnea, you may be more likely to:  Perform poorly at school or work.  Fall asleep while driving.  Have difficulty with attention.  Develop depression or anxiety.  Become severely overweight (obese).  Have sexual dysfunction. What actions can I take to manage sleep apnea? Sleep apnea treatment   If you were given a device to open your airway while you sleep, use it only as told by your health care provider. You may be given: ? An oral appliance. This is a custom-made mouthpiece that shifts your lower jaw forward. ? A continuous positive airway pressure (CPAP) device. This device blows air through a mask when you breathe out (exhale). ? A nasal expiratory positive airway pressure (EPAP) device. This device has valves that you put into each nostril. ? A bi-level positive airway pressure (BPAP) device. This device blows air through a mask when you breathe in (inhale) and breathe out (exhale).  You may need surgery if other treatments do not work for you. Sleep  habits  Go to sleep and wake up at the same time every day. This helps set your internal clock (circadian rhythm) for sleeping. ? If you stay up later than usual, such as on weekends, try to get up in the morning within 2 hours of your normal wake time.  Try to get at least 7-9 hours of sleep each night.  Stop computer, tablet, and mobile phone use a few hours before bedtime.  Do not take long naps during the day. If you nap, limit it to 30 minutes.  Have a relaxing bedtime routine. Reading or listening to music may relax you and help you sleep.  Use your bedroom only for sleep. ? Keep your television and computer out of your bedroom. ? Keep your bedroom cool, dark, and quiet. ? Use a supportive mattress and pillows.  Follow your health care provider's instructions for other changes to sleep habits. Nutrition  Do not eat heavy meals in the evening.  Do not have caffeine in the later part of the day. The effects of caffeine can last for more than 5 hours.  Follow your health care provider's or dietitian's instructions for any diet changes. Lifestyle      Do not drink alcohol before bedtime. Alcohol can cause you to fall asleep at first, but then it can cause you to wake up in the middle of the night and have trouble getting back to sleep.  Do not use any products that contain nicotine or tobacco, such as cigarettes and e-cigarettes. If you need help quitting, ask your health care provider. Medicines  Take over-the-counter and prescription medicines only as told by your health care provider.  Do not use over-the-counter sleep medicine. You can become dependent on this medicine, and it can make sleep apnea worse.  Do not use medicines, such as sedatives and narcotics, unless told by your health care provider. Activity  Exercise on most days, but avoid exercising in the evening. Exercising near bedtime can interfere with sleeping.  If possible, spend time outside every day.  Natural light helps regulate your circadian rhythm. General information  Lose weight if you need to, and maintain a healthy weight.  Keep all follow-up visits as told by  your health care provider. This is important.  If you are having surgery, make sure to tell your health care provider that you have sleep apnea. You may need to bring your device with you. Where to find more information Learn more about sleep apnea and daytime fatigue from:  American Sleep Association: sleepassociation.[place]: sleepfoundation.org  National Heart, Lung, and Blood Institute: https://www.[place].biz/ Summary  Sleep apnea can cause daytime fatigue and other serious health conditions.  Both sleep apnea and daytime fatigue can be bad for your health and well-being.  You may need to wear a device while sleeping to help keep your airway open.  If you are having surgery, make sure to tell your health care provider that you have sleep apnea. You may need to bring your device with you.  Making changes to sleep habits, diet, lifestyle, and activity can help you manage sleep apnea. This information is not intended to replace advice given to you by your health care provider. Make sure you discuss any questions you have with your health care provider. Document Revised: 07/23/2018 Document Reviewed: 06/25/2017 Elsevier Patient Education  [place].

## 2020-03-07 ENCOUNTER — Other Ambulatory Visit: Payer: Self-pay

## 2020-03-07 DIAGNOSIS — E782 Mixed hyperlipidemia: Secondary | ICD-10-CM

## 2020-03-07 LAB — CBC WITH DIFFERENTIAL/PLATELET
Basophils Absolute: 0 10*3/uL (ref 0.0–0.2)
Basos: 1 %
EOS (ABSOLUTE): 0.1 10*3/uL (ref 0.0–0.4)
Eos: 1 %
Hematocrit: 45.7 % (ref 37.5–51.0)
Hemoglobin: 15.6 g/dL (ref 13.0–17.7)
Immature Grans (Abs): 0 10*3/uL (ref 0.0–0.1)
Immature Granulocytes: 0 %
Lymphocytes Absolute: 1.2 10*3/uL (ref 0.7–3.1)
Lymphs: 16 %
MCH: 29.3 pg (ref 26.6–33.0)
MCHC: 34.1 g/dL (ref 31.5–35.7)
MCV: 86 fL (ref 79–97)
Monocytes Absolute: 1 10*3/uL — ABNORMAL HIGH (ref 0.1–0.9)
Monocytes: 13 %
Neutrophils Absolute: 5.3 10*3/uL (ref 1.4–7.0)
Neutrophils: 69 %
Platelets: 262 10*3/uL (ref 150–450)
RBC: 5.32 x10E6/uL (ref 4.14–5.80)
RDW: 14.5 % (ref 11.6–15.4)
WBC: 7.6 10*3/uL (ref 3.4–10.8)

## 2020-03-07 LAB — COMPREHENSIVE METABOLIC PANEL
ALT: 27 IU/L (ref 0–44)
AST: 27 IU/L (ref 0–40)
Albumin/Globulin Ratio: 1.8 (ref 1.2–2.2)
Albumin: 4.6 g/dL (ref 3.8–4.8)
Alkaline Phosphatase: 146 IU/L — ABNORMAL HIGH (ref 44–121)
BUN/Creatinine Ratio: 13 (ref 10–24)
BUN: 13 mg/dL (ref 8–27)
Bilirubin Total: 0.5 mg/dL (ref 0.0–1.2)
CO2: 24 mmol/L (ref 20–29)
Calcium: 10.1 mg/dL (ref 8.6–10.2)
Chloride: 101 mmol/L (ref 96–106)
Creatinine, Ser: 1.02 mg/dL (ref 0.76–1.27)
GFR calc Af Amer: 88 mL/min/{1.73_m2} (ref >59)
GFR calc non Af Amer: 76 mL/min/{1.73_m2} (ref >59)
Globulin, Total: 2.5 g/dL (ref 1.5–4.5)
Glucose: 79 mg/dL (ref 65–99)
Potassium: 4.7 mmol/L (ref 3.5–5.2)
Sodium: 140 mmol/L (ref 134–144)
Total Protein: 7.1 g/dL (ref 6.0–8.5)

## 2020-03-07 LAB — LIPID PANEL
Chol/HDL Ratio: 4.1 ratio (ref 0.0–5.0)
Cholesterol, Total: 240 mg/dL — ABNORMAL HIGH (ref 100–199)
HDL: 58 mg/dL (ref >39)
LDL Chol Calc (NIH): 154 mg/dL — ABNORMAL HIGH (ref 0–99)
Triglycerides: 157 mg/dL — ABNORMAL HIGH (ref 0–149)
VLDL Cholesterol Cal: 28 mg/dL (ref 5–40)

## 2020-03-07 LAB — CARDIOVASCULAR RISK ASSESSMENT

## 2020-03-07 MED ORDER — ROSUVASTATIN CALCIUM 10 MG PO TABS: 10.0000 mg | ORAL_TABLET | Freq: Every day | ORAL | 1 refills | Status: DC

## 2020-04-23 ENCOUNTER — Other Ambulatory Visit: Payer: Self-pay

## 2020-04-23 MED ORDER — AMLODIPINE BESYLATE 5 MG PO TABS: 5.0000 mg | ORAL_TABLET | Freq: Every day | ORAL | 0 refills | Status: DC

## 2020-05-01 ENCOUNTER — Other Ambulatory Visit: Payer: Self-pay | Admitting: Family Medicine

## 2020-05-01 ENCOUNTER — Encounter: Payer: Self-pay | Admitting: Family Medicine

## 2020-05-01 ENCOUNTER — Telehealth (INDEPENDENT_AMBULATORY_CARE_PROVIDER_SITE_OTHER): Payer: Self-pay | Admitting: Family Medicine

## 2020-05-01 VITALS — BP 174/92 | HR 85 | Resp 22 | Ht 72.0 in | Wt 180.0 lb

## 2020-05-01 DIAGNOSIS — R0602 Shortness of breath: Secondary | ICD-10-CM

## 2020-05-01 DIAGNOSIS — Z20822 Contact with and (suspected) exposure to covid-19: Secondary | ICD-10-CM

## 2020-05-01 DIAGNOSIS — J208 Acute bronchitis due to other specified organisms: Secondary | ICD-10-CM

## 2020-05-01 DIAGNOSIS — I10 Essential (primary) hypertension: Secondary | ICD-10-CM

## 2020-05-01 DIAGNOSIS — R059 Cough, unspecified: Secondary | ICD-10-CM | POA: Diagnosis not present

## 2020-05-01 DIAGNOSIS — R0789 Other chest pain: Secondary | ICD-10-CM | POA: Diagnosis not present

## 2020-05-01 LAB — POC COVID19 BINAXNOW: SARS Coronavirus 2 Ag: NEGATIVE

## 2020-05-01 NOTE — Progress Notes (Signed)
Date:  05/06/2020   ID:  Joe Arroyo, DOB [birth year], MRN [id number]  PCP:  Joe Brome, MD   Evaluation Performed: acute Chief Complaint: Bronchitis  History of Present Illness:   Began as a virtual visit, but converted to in house after rapid covid 19 negative.   Joe Arroyo is a 65 y.o. male persistent shortness of breath, coughing, chest tightness for 6 weeks.  The patient was seen at the urgent care 5 weeks ago and was given Kenalog, antibiotics, Mucinex.  He was tested for COVID twice both of which were negative.  Shortness of breath seems to have gotten worse in the last 24 hours.  He has significant dyspnea on exertion.  He denies fevers.  He is very tired.  Feels dizzy at times.  The patient does have symptoms concerning for COVID-19 infection (fever, chills, cough, or new shortness of breath).    Past Medical History:  Diagnosis Date  . Hyperlipidemia 05/14/2018  . Hypertension 05/14/2018  . Kidney stones   . Sinus bradycardia 05/14/2018    Past Surgical History:  Procedure Laterality Date  . LAMINECTOMY    . MOLE REMOVAL    . PROSTATE BIOPSY    . TONSILLECTOMY AND ADENOIDECTOMY      Family History  Problem Relation Age of Onset  . Hyperlipidemia Mother   . Aortic stenosis Mother   . Valvular heart disease Mother   . Colon cancer Mother   . Diabetes Mellitus II Mother   . Cancer - Colon Mother   . Cancer Mother   . Stroke Mother   . Cerebrovascular Accident Mother   . Diabetes Mother   . Hypertension Mother   . Hyperlipidemia Father   . Diabetes Mellitus II Father   . Diabetes Father   . CAD Maternal Grandmother   . Congestive Heart Failure Maternal Grandmother   . Heart attack Maternal Grandmother   . Heart failure Maternal Grandmother   . Coronary artery disease Maternal Grandmother   . Hyperlipidemia Maternal Grandmother     Social History   Socioeconomic History  . Marital status: Single    Spouse name: Not on file  . Number of children:  Not on file  . Years of education: Not on file  . Highest education level: Not on file  Occupational History  . Not on file  Tobacco Use  . Smoking status: Never Smoker  . Smokeless tobacco: Never Used  Vaping Use  . Vaping Use: Never used  Substance and Sexual Activity  . Alcohol use: Yes    Comment: occ  . Drug use: Never  . Sexual activity: Not on file  Other Topics Concern  . Not on file  Social History Narrative  . Not on file   Social Determinants of Health   Financial Resource Strain: Not on file  Food Insecurity: Not on file  Transportation Needs: Not on file  Physical Activity: Not on file  Stress: Not on file  Social Connections: Not on file  Intimate Partner Violence: Not on file    Outpatient Medications Prior to Visit  Medication Sig Dispense Refill  . amLODipine (NORVASC) 5 MG tablet Take 1 tablet (5 mg total) by mouth daily. 90 tablet 0  . cloNIDine (CATAPRES) 0.3 MG tablet Take 1 tablet (0.3 mg total) by mouth daily. 90 tablet 1  . esomeprazole (NEXIUM) 20 MG capsule Take 20 mg by mouth daily at 12 noon.    . ezetimibe (ZETIA) 10 MG tablet Take 1 tablet (  10 mg total) by mouth daily. 90 tablet 1  . finasteride (PROSCAR) 5 MG tablet Take 1 tablet by mouth daily.    . meloxicam (MOBIC) 15 MG tablet Take 1 tablet (15 mg total) by mouth daily as needed. 90 tablet 0  . Multiple Vitamins-Minerals (CENTRUM SILVER ULTRA MENS PO) Take 1 tablet by mouth daily.    . Omega-3 Fatty Acids (FISH OIL PO) Take 1 tablet by mouth 2 (two) times daily.    . rosuvastatin (CRESTOR) 10 MG tablet Take 1 tablet (10 mg total) by mouth at bedtime. 90 tablet 1  . montelukast (SINGULAIR) 10 MG tablet Take 1 tablet (10 mg total) by mouth at bedtime. 90 tablet 3   No facility-administered medications prior to visit.    Allergies:   Bystolic [nebivolol hcl], Carvedilol, Pravastatin, Hctz [hydrochlorothiazide], Hydralazine, and Penicillins   Social History   Tobacco Use  . Smoking  status: Never Smoker  . Smokeless tobacco: Never Used  Vaping Use  . Vaping Use: Never used  Substance Use Topics  . Alcohol use: Yes    Comment: occ  . Drug use: Never     Review of Systems  Constitutional: Positive for malaise/fatigue. Negative for chills and fever.  HENT: Negative for ear pain and sore throat.        Has runny nose.   Respiratory: Positive for cough, sputum production (white) and shortness of breath.   Cardiovascular: Positive for chest pain (feels tight).  Gastrointestinal: Negative for abdominal pain, nausea and vomiting.  Musculoskeletal: Negative for myalgias.  Neurological: Positive for dizziness. Negative for headaches.     Labs/Other Tests and Data Reviewed:    Recent Labs: 09/05/2019: TSH 1.790 03/06/2020: ALT 27; BUN 13; Creatinine, Ser 1.02; Hemoglobin 15.6; Platelets 262; Potassium 4.7; Sodium 140   Recent Lipid Panel Lab Results  Component Value Date/Time   CHOL 240 (H) 03/06/2020 10:39 AM   TRIG 157 (H) 03/06/2020 10:39 AM   HDL 58 03/06/2020 10:39 AM   CHOLHDL 4.1 03/06/2020 10:39 AM   LDLCALC 154 (H) 03/06/2020 10:39 AM    Wt Readings from Last 3 Encounters:  05/01/20 180 lb (81.6 kg)  03/06/20 193 lb (87.5 kg)  01/31/20 193 lb 5 oz (87.7 kg)     Objective:    Vital Signs:  BP (!) 174/92   Pulse 85   Resp (!) 22   Ht 6' (1.829 m)   Wt 180 lb (81.6 kg)   SpO2 96%   BMI 24.41 kg/m    Physical Exam Vitals reviewed.  Constitutional:      General: He is in acute distress (mild. appears short of breath. ).  Cardiovascular:     Rate and Rhythm: Normal rate and regular rhythm.     Heart sounds: Normal heart sounds.  Pulmonary:     Effort: Pulmonary effort is normal.     Breath sounds: Normal breath sounds. No wheezing or rales.  Neurological:     Mental Status: He is alert and oriented to person, place, and time.  Psychiatric:        Mood and Affect: Mood normal.        Behavior: Behavior normal.      ASSESSMENT & PLAN:    1. Acute bronchitis due to other specified organisms Given breztri 2 puffs twice a day.  - POC COVID-19 Binax Now negative.   2. Shortness of breath: unclear etiology.   PEAK FLOWS< 450, 350, 450 Ordered cbc, cmp, d dimer (all normal.)  Ordered CXR - results normal.   3. Hypertension: Has been well controlled.  Check bp at home. Follow up in 1 week.   Orders Placed This Encounter  Procedures  . POC COVID-19 BinaxNow     Follow Up:1 week Signed, Joe Brome, MD  05/06/2020 9:21 PM    [place]

## 2020-05-06 ENCOUNTER — Encounter: Payer: Self-pay | Admitting: Family Medicine

## 2020-05-10 ENCOUNTER — Other Ambulatory Visit: Payer: Self-pay

## 2020-05-10 ENCOUNTER — Ambulatory Visit (INDEPENDENT_AMBULATORY_CARE_PROVIDER_SITE_OTHER): Payer: Managed Care, Other (non HMO) | Admitting: Family Medicine

## 2020-05-10 VITALS — BP 138/84 | HR 84 | Temp 97.5°F | Resp 18 | Ht 72.0 in | Wt 188.0 lb

## 2020-05-10 DIAGNOSIS — R0789 Other chest pain: Secondary | ICD-10-CM

## 2020-05-10 DIAGNOSIS — R0600 Dyspnea, unspecified: Secondary | ICD-10-CM | POA: Diagnosis not present

## 2020-05-10 DIAGNOSIS — R0609 Other forms of dyspnea: Secondary | ICD-10-CM

## 2020-05-10 DIAGNOSIS — N200 Calculus of kidney: Secondary | ICD-10-CM | POA: Insufficient documentation

## 2020-05-10 NOTE — Progress Notes (Signed)
Subjective:  Patient ID: Joe Arroyo, male    DOB: 1955/10/25  Age: 65 y.o. MRN: [id number]  Chief Complaint  Patient presents with  . Shortness of Breath    HPI Patient presents for follow-up of shortness of breath.  Patient complains of dyspnea on exertion.  He has some chest discomfort is very mild.  It can occur at rest or with exertion.  He was seen 1 week ago for this and given breztri.  He has been using this but is not helping at all.  He has been having the symptoms progressively worsened over the last 5 to 6 weeks.  He did have bronchitis in December but it was not Covid.  He tested negative for Covid last week.  Chest x-ray was normal.  Blood work was normal.  Peak flows were actually good but we opted to try the breztri anyways.  Current Outpatient Medications on File Prior to Visit  Medication Sig Dispense Refill  . amLODipine (NORVASC) 5 MG tablet Take 1 tablet (5 mg total) by mouth daily. 90 tablet 0  . cloNIDine (CATAPRES) 0.3 MG tablet Take 1 tablet (0.3 mg total) by mouth daily. 90 tablet 1  . esomeprazole (NEXIUM) 20 MG capsule Take 20 mg by mouth daily at 12 noon.    . ezetimibe (ZETIA) 10 MG tablet Take 1 tablet (10 mg total) by mouth daily. 90 tablet 1  . finasteride (PROSCAR) 5 MG tablet Take 1 tablet by mouth daily.    . meloxicam (MOBIC) 15 MG tablet Take 1 tablet (15 mg total) by mouth daily as needed. 90 tablet 0  . Multiple Vitamins-Minerals (CENTRUM SILVER ULTRA MENS PO) Take 1 tablet by mouth daily.    . Omega-3 Fatty Acids (FISH OIL PO) Take 1 tablet by mouth 2 (two) times daily.    . rosuvastatin (CRESTOR) 10 MG tablet Take 1 tablet (10 mg total) by mouth at bedtime. 90 tablet 1   No current facility-administered medications on file prior to visit.   Past Medical History:  Diagnosis Date  . Hyperlipidemia 05/14/2018  . Hypertension 05/14/2018  . Kidney stones   . Sinus bradycardia 05/14/2018   Past Surgical History:  Procedure Laterality Date  .  LAMINECTOMY    . MOLE REMOVAL    . PROSTATE BIOPSY    . TONSILLECTOMY AND ADENOIDECTOMY      Family History  Problem Relation Age of Onset  . Hyperlipidemia Mother   . Aortic stenosis Mother   . Valvular heart disease Mother   . Colon cancer Mother   . Diabetes Mellitus II Mother   . Cancer - Colon Mother   . Cancer Mother   . Stroke Mother   . Cerebrovascular Accident Mother   . Diabetes Mother   . Hypertension Mother   . Hyperlipidemia Father   . Diabetes Mellitus II Father   . Diabetes Father   . CAD Maternal Grandmother   . Congestive Heart Failure Maternal Grandmother   . Heart attack Maternal Grandmother   . Heart failure Maternal Grandmother   . Coronary artery disease Maternal Grandmother   . Hyperlipidemia Maternal Grandmother    Social History   Socioeconomic History  . Marital status: Single    Spouse name: Not on file  . Number of children: Not on file  . Years of education: Not on file  . Highest education level: Not on file  Occupational History  . Not on file  Tobacco Use  . Smoking status: Never Smoker  .  Smokeless tobacco: Never Used  Vaping Use  . Vaping Use: Never used  Substance and Sexual Activity  . Alcohol use: Yes    Comment: occ  . Drug use: Never  . Sexual activity: Not on file  Other Topics Concern  . Not on file  Social History Narrative  . Not on file   Social Determinants of Health   Financial Resource Strain: Not on file  Food Insecurity: Not on file  Transportation Needs: Not on file  Physical Activity: Not on file  Stress: Not on file  Social Connections: Not on file    Review of Systems  Constitutional: Positive for fatigue. Negative for chills and fever.  HENT: Negative for congestion, rhinorrhea and sore throat.   Respiratory: Positive for cough and shortness of breath.   Cardiovascular: Positive for chest pain. Negative for palpitations and leg swelling.  Gastrointestinal: Negative for abdominal pain,  constipation, diarrhea, nausea and vomiting.  Genitourinary: Negative for dysuria and urgency.  Musculoskeletal: Negative for arthralgias, back pain and myalgias.  Neurological: Positive for dizziness. Negative for headaches.  Psychiatric/Behavioral: Negative for dysphoric mood. The patient is not nervous/anxious.      Objective:  BP 138/84   Pulse 84   Temp (!) 97.5 F (36.4 C)   Resp 18   Ht 6' (1.829 m)   Wt 188 lb (85.3 kg)   BMI 25.50 kg/m   BP/Weight 05/11/2020 05/10/2020 9/98/3382  Systolic BP 505 397 673  Diastolic BP 80 84 92  Wt. (Lbs) 195 188 180  BMI 26.45 25.5 24.41    Physical Exam Vitals reviewed.  Constitutional:      Appearance: He is well-developed.  Cardiovascular:     Rate and Rhythm: Normal rate and regular rhythm.     Heart sounds: Murmur heard.    Pulmonary:     Effort: Pulmonary effort is normal.     Breath sounds: Normal breath sounds.  Neurological:     Mental Status: He is alert.     Lab Results  Component Value Date   WBC 7.6 03/06/2020   HGB 15.6 03/06/2020   HCT 45.7 03/06/2020   PLT 262 03/06/2020   GLUCOSE 79 03/06/2020   CHOL 240 (H) 03/06/2020   TRIG 157 (H) 03/06/2020   HDL 58 03/06/2020   LDLCALC 154 (H) 03/06/2020   ALT 27 03/06/2020   AST 27 03/06/2020   NA 140 03/06/2020   K 4.7 03/06/2020   CL 101 03/06/2020   CREATININE 1.02 03/06/2020   BUN 13 03/06/2020   CO2 24 03/06/2020   TSH 1.790 09/05/2019      Assessment & Plan:   1. Dyspnea on exertion 2. Other chest pain - EKG 12-Lead - normal.  I reviewed cxr, labs with patient.   Plan: refer to cardiology.  I am concerned he may need a stress echo and resting echo (heart murmur.)   Orders Placed This Encounter  Procedures  . Ambulatory referral to Cardiology  . EKG 12-Lead     Follow-up: routine follow up visit.   An After Visit Summary was printed and given to the patient.  [name], MD [facility] [phone]

## 2020-05-10 NOTE — Progress Notes (Unsigned)
Cardiology Office Note:    Date:  05/11/2020   ID:  Joe Arroyo, DOB Apr 06, 1956, MRN [id number]  PCP:  [name], MD  Cardiologist:  Shirlee More, MD    Referring MD: [name], MD    ASSESSMENT:    1. Shortness of breath   2. Resistant hypertension   3. Mixed hyperlipidemia    PLAN:    In order of problems listed above:  1. This is clearly related to his preceding respiratory infection with persistent shortness of breath.  On physical exam he has no fluid overload no signs of heart failure and no bronchospasm.  Further evaluation check a proBNP level particularly helpful in people of uncertain etiology with nondiagnostic testing if normal makes heart failure very unlikely and also recheck his echocardiogram to better define diastolic function any progression in valvular heart disease although would be extremely unlikely in such a short period of time if these are reassuring I think he benefit from full PFTs and perhaps a high-resolution CT scan of the chest.  He has no risk factors for venous thromboembolism and D-dimer is normal I do not think he needs to have a CTA of his chest at this time 2. Stable blood pressure target continue his current regimen Catapres amlodipine 3. Continue statin   Next appointment: To be determined after seeing the results of his proBNP and echocardiogram   Medication Adjustments/Labs and Tests Ordered: Current medicines are reviewed at length with the patient today.  Concerns regarding medicines are outlined above.  No orders of the defined types were placed in this encounter.  No orders of the defined types were placed in this encounter.   Chief Complaint  Patient presents with  . Follow-up  . Hypertension    History of Present Illness:    Joe Arroyo is a 65 y.o. male with a hx of resistant hypertension and hyperlipidemia last seen 08/17/2018 virtual visit during Covid surge.Joe Arroyo  He was seen in the office PCP visit 05/10/2020 with  complaints of exertional chest discomfort and shortness of breath.  He was diagnosed as bronchitis and treated with Kenalog antibiotics and Mucinex.  Compliance with diet, lifestyle and medications: Yes  Recent testing [facility] 05/01/2020 chest x-ray was performed for shortness of breath cough and chest pain it was normal.  The test negative for infection.  Other test at [facility] showed normal CMP D-dimer and CBC reviewed by me EKG Dr. Alyse Low office yesterday personally reviewed by me, it showed sinus rhythm and remain normal.  Patient relates he is here because of his shortness of breath. Since mid December he has not done well initially had fever cough congestion wheezing and was treated and somewhat improved.  Since then he is short of breath with activities sometimes at night but has no edema orthopnea or PND. He has had no chest pain palpitation or syncope. An echocardiogram done in our office June showed normal ejection fraction, report said that his filling pressures elevated pseudonormal function however looking at the indices of E prime EE prime left atrial size and tricuspid valve regurgitation peak systolic velocity I would define his filling pressures was normal.  His aortic valve velocities are best described as top normal or minimal aortic stenosis although the valve is described as normal in appearance. Past Medical History:  Diagnosis Date  . Hyperlipidemia 05/14/2018  . Hypertension 05/14/2018  . Kidney stones   . Sinus bradycardia 05/14/2018    Past Surgical History:  Procedure Laterality Date  .  LAMINECTOMY    . MOLE REMOVAL    . PROSTATE BIOPSY    . TONSILLECTOMY AND ADENOIDECTOMY      Current Medications: Current Meds  Medication Sig  . amLODipine (NORVASC) 5 MG tablet Take 1 tablet (5 mg total) by mouth daily.  . cloNIDine (CATAPRES) 0.3 MG tablet Take 1 tablet (0.3 mg total) by mouth daily.  Joe Arroyo esomeprazole (NEXIUM) 20 MG capsule Take 20 mg by mouth  daily at 12 noon.  . ezetimibe (ZETIA) 10 MG tablet Take 1 tablet (10 mg total) by mouth daily.  . finasteride (PROSCAR) 5 MG tablet Take 1 tablet by mouth daily.  . meloxicam (MOBIC) 15 MG tablet Take 1 tablet (15 mg total) by mouth daily as needed.  . Multiple Vitamins-Minerals (CENTRUM SILVER ULTRA MENS PO) Take 1 tablet by mouth daily.  . Omega-3 Fatty Acids (FISH OIL PO) Take 1 tablet by mouth 2 (two) times daily.  . rosuvastatin (CRESTOR) 10 MG tablet Take 1 tablet (10 mg total) by mouth at bedtime.     Allergies:   Bystolic [nebivolol hcl], Carvedilol, Pravastatin, Hctz [hydrochlorothiazide], Hydralazine, and Penicillins   Social History   Socioeconomic History  . Marital status: Single    Spouse name: Not on file  . Number of children: Not on file  . Years of education: Not on file  . Highest education level: Not on file  Occupational History  . Not on file  Tobacco Use  . Smoking status: Never Smoker  . Smokeless tobacco: Never Used  Vaping Use  . Vaping Use: Never used  Substance and Sexual Activity  . Alcohol use: Yes    Comment: occ  . Drug use: Never  . Sexual activity: Not on file  Other Topics Concern  . Not on file  Social History Narrative  . Not on file   Social Determinants of Health   Financial Resource Strain: Not on file  Food Insecurity: Not on file  Transportation Needs: Not on file  Physical Activity: Not on file  Stress: Not on file  Social Connections: Not on file     Family History: The patient's family history includes Aortic stenosis in his mother; CAD in his maternal grandmother; Cancer in his mother; Cancer - Colon in his mother; Cerebrovascular Accident in his mother; Colon cancer in his mother; Congestive Heart Failure in his maternal grandmother; Coronary artery disease in his maternal grandmother; Diabetes in his father and mother; Diabetes Mellitus II in his father and mother; Heart attack in his maternal grandmother; Heart failure  in his maternal grandmother; Hyperlipidemia in his father, maternal grandmother, and mother; Hypertension in his mother; Stroke in his mother; Valvular heart disease in his mother. ROS:   Please see the history of present illness.    All other systems reviewed and are negative.  EKGs/Labs/Other Studies Reviewed:    The following studies were reviewed today:   Recent Labs: 09/05/2019: TSH 1.790 03/06/2020: ALT 27; BUN 13; Creatinine, Ser 1.02; Hemoglobin 15.6; Platelets 262; Potassium 4.7; Sodium 140  Recent Lipid Panel    Component Value Date/Time   CHOL 240 (H) 03/06/2020 1039   TRIG 157 (H) 03/06/2020 1039   HDL 58 03/06/2020 1039   CHOLHDL 4.1 03/06/2020 1039   LDLCALC 154 (H) 03/06/2020 1039    Physical Exam:    VS:  BP 130/80   Pulse 82   Ht 6' (1.829 m)   Wt 195 lb (88.5 kg)   SpO2 98%   BMI 26.45 kg/m  Wt Readings from Last 3 Encounters:  05/11/20 195 lb (88.5 kg)  05/10/20 188 lb (85.3 kg)  05/01/20 180 lb (81.6 kg)     GEN:  Well nourished, well developed in no acute distress HEENT: Normal NECK: No JVD; No carotid bruits LYMPHATICS: No lymphadenopathy CARDIAC: Grade 1/6 midsystolic ejection murmur no neck vein distention S3 and he has no edema RRR, no, rubs, gallops RESPIRATORY:  Clear to auscultation without rales, wheezing or rhonchi  ABDOMEN: Soft, non-tender, non-distended MUSCULOSKELETAL:  No edema; No deformity  SKIN: Warm and dry NEUROLOGIC:  Alert and oriented x 3 PSYCHIATRIC:  Normal affect    Signed, Shirlee More, MD  05/11/2020 11:25 AM    [facility]

## 2020-05-11 ENCOUNTER — Other Ambulatory Visit (INDEPENDENT_AMBULATORY_CARE_PROVIDER_SITE_OTHER): Payer: Managed Care, Other (non HMO)

## 2020-05-11 ENCOUNTER — Encounter: Payer: Self-pay | Admitting: Cardiology

## 2020-05-11 ENCOUNTER — Telehealth: Payer: Self-pay

## 2020-05-11 ENCOUNTER — Ambulatory Visit (INDEPENDENT_AMBULATORY_CARE_PROVIDER_SITE_OTHER): Payer: Managed Care, Other (non HMO) | Admitting: Cardiology

## 2020-05-11 VITALS — BP 130/80 | HR 82 | Ht 72.0 in | Wt 195.0 lb

## 2020-05-11 DIAGNOSIS — I10 Essential (primary) hypertension: Secondary | ICD-10-CM | POA: Diagnosis not present

## 2020-05-11 DIAGNOSIS — R0602 Shortness of breath: Secondary | ICD-10-CM | POA: Diagnosis not present

## 2020-05-11 DIAGNOSIS — E782 Mixed hyperlipidemia: Secondary | ICD-10-CM

## 2020-05-11 LAB — ECHOCARDIOGRAM COMPLETE
Area-P 1/2: 3.48 cm2
Calc EF: 68.4 %
Height: 72 in
S' Lateral: 2.45 cm
Single Plane A2C EF: 70.7 %
Single Plane A4C EF: 65.2 %
Weight: 3120 oz

## 2020-05-11 NOTE — Patient Instructions (Signed)
Medication Instructions:  Your physician recommends that you continue on your current medications as directed. Please refer to the Current Medication list given to you today.  *If you need a refill on your cardiac medications before your next appointment, please call your pharmacy*   Lab Work: Your physician recommends that you return for lab work in: [place] If you have labs (blood work) drawn today and your tests are completely normal, you will receive your results only by: [name] MyChart Message (if you have MyChart) OR . A paper copy in the mail If you have any lab test that is abnormal or we need to change your treatment, we will call you to review the results.   Testing/Procedures: Your physician has requested that you have an echocardiogram. Echocardiography is a painless test that uses sound waves to create images of your heart. It provides your doctor with information about the size and shape of your heart and how well your heart's chambers and valves are working. This procedure takes approximately one hour. There are no restrictions for this procedure.     Follow-Up: At [facility], you and your health needs are our priority.  As part of our continuing mission to provide you with exceptional heart care, we have created designated Provider Care Teams.  These Care Teams include your primary Cardiologist (physician) and Advanced Practice Providers (APPs -  Physician Assistants and Nurse Practitioners) who all work together to provide you with the care you need, when you need it.  We recommend signing up for the patient portal called "MyChart".  Sign up information is provided on this After Visit Summary.  MyChart is used to connect with patients for Virtual Visits (Telemedicine).  Patients are able to view lab/test results, encounter notes, upcoming appointments, etc.  Non-urgent messages can be sent to your provider as well.   To learn more about what you can do with MyChart, go  to [email or web address].    Your next appointment:   1 month(s)  The format for your next appointment:   In Person  Provider:   Shirlee More, MD   Other Instructions

## 2020-05-11 NOTE — Telephone Encounter (Signed)
Spoke with patient regarding results and recommendation.  Patient verbalizes understanding and is agreeable to plan of care. Advised patient to call back with any issues or concerns.  

## 2020-05-11 NOTE — Progress Notes (Signed)
2D echo has been performed. °

## 2020-05-12 LAB — PRO B NATRIURETIC PEPTIDE: NT-Pro BNP: 67 pg/mL (ref 0–376)

## 2020-05-13 ENCOUNTER — Encounter: Payer: Self-pay | Admitting: Family Medicine

## 2020-05-14 ENCOUNTER — Telehealth: Payer: Self-pay

## 2020-05-14 NOTE — Telephone Encounter (Signed)
-----   Message from Richardo Priest, MD sent at 05/12/2020  3:42 PM EST ----- Both his echocardiogram and laboratory test showed no findings of congestive heart failure to account for shortness of breath  I think it is best if he has further evaluation including pulmonary function test and I would direct him back to his primary care physician.

## 2020-05-14 NOTE — Telephone Encounter (Signed)
Spoke with patient regarding results and recommendation.  Patient verbalizes understanding and is agreeable to plan of care. Advised patient to call back with any issues or concerns.  

## 2020-05-15 ENCOUNTER — Other Ambulatory Visit: Payer: Self-pay

## 2020-05-15 ENCOUNTER — Telehealth: Payer: Self-pay

## 2020-05-15 DIAGNOSIS — R0609 Other forms of dyspnea: Secondary | ICD-10-CM

## 2020-05-15 DIAGNOSIS — R0600 Dyspnea, unspecified: Secondary | ICD-10-CM

## 2020-05-15 NOTE — Telephone Encounter (Signed)
Refer to pulmonology - ask if he wishes to go to [facility].

## 2020-05-15 NOTE — Telephone Encounter (Signed)
Joe Arroyo called to report that his work-up with Dr. Bettina Gavia came by clear.  He is concerned that his symptoms have not improved and he is wondering where we go from here.

## 2020-05-15 NOTE — Telephone Encounter (Signed)
Patient prefers Joe Arroyo.

## 2020-06-04 ENCOUNTER — Encounter: Payer: Self-pay | Admitting: Pulmonary Disease

## 2020-06-04 ENCOUNTER — Ambulatory Visit (INDEPENDENT_AMBULATORY_CARE_PROVIDER_SITE_OTHER): Payer: Medicare HMO | Admitting: Pulmonary Disease

## 2020-06-04 ENCOUNTER — Other Ambulatory Visit: Payer: Self-pay

## 2020-06-04 VITALS — BP 126/82 | HR 89 | Temp 98.0°F | Ht 72.0 in | Wt 197.4 lb

## 2020-06-04 DIAGNOSIS — R0602 Shortness of breath: Secondary | ICD-10-CM

## 2020-06-04 LAB — CBC WITH DIFFERENTIAL/PLATELET
Basophils Absolute: 0 10*3/uL (ref 0.0–0.1)
Basophils Relative: 0.3 % (ref 0.0–3.0)
Eosinophils Absolute: 0.1 10*3/uL (ref 0.0–0.7)
Eosinophils Relative: 1.7 % (ref 0.0–5.0)
HCT: 45.5 % (ref 39.0–52.0)
Hemoglobin: 15.3 g/dL (ref 13.0–17.0)
Lymphocytes Relative: 14.6 % (ref 12.0–46.0)
Lymphs Abs: 1 10*3/uL (ref 0.7–4.0)
MCHC: 33.6 g/dL (ref 30.0–36.0)
MCV: 87.7 fl (ref 78.0–100.0)
Monocytes Absolute: 0.8 10*3/uL (ref 0.1–1.0)
Monocytes Relative: 12 % (ref 3.0–12.0)
Neutro Abs: 4.9 10*3/uL (ref 1.4–7.7)
Neutrophils Relative %: 71.4 % (ref 43.0–77.0)
Platelets: 215 10*3/uL (ref 150.0–400.0)
RBC: 5.18 Mil/uL (ref 4.22–5.81)
RDW: 16.3 % — ABNORMAL HIGH (ref 11.5–15.5)
WBC: 6.9 10*3/uL (ref 4.0–10.5)

## 2020-06-04 LAB — SEDIMENTATION RATE: Sed Rate: 5 mm/hr (ref 0–20)

## 2020-06-04 LAB — C-REACTIVE PROTEIN: CRP: <1 mg/dL (ref 0.5–20.0)

## 2020-06-04 MED ORDER — AZITHROMYCIN 250 MG PO TABS: 250.0000 mg | ORAL_TABLET | Freq: Every day | ORAL | 0 refills | Status: DC

## 2020-06-04 MED ORDER — PREDNISONE 10 MG PO TABS: 10.0000 mg | ORAL_TABLET | Freq: Every day | ORAL | 0 refills | Status: DC

## 2020-06-04 NOTE — Patient Instructions (Signed)
We will check labs today and call you with the results  We will schedule you for pulmonary function tests in our clinic  We will schedule you for CT Chest scan at [facility] Prednisone Taper: 40mg  daily for 3 days 30mg  daily for 3 days 20mg  daily for 3 days 10mg  daily for 3 days  Start azithromycin antibiotic

## 2020-06-04 NOTE — Progress Notes (Signed)
Synopsis: Referred in February 2022 by [name], MD for dyspnea on exertion.  Subjective:   PATIENT ID: Joe Arroyo GENDER: male DOB: 02-02-1956, MRN: [id number]   HPI  Chief Complaint  Patient presents with  . Consult    Referred by PCP for SOB that started around Christmas 2021. States he has been cleared by cardiology for the SOB. He notices the SOB with exertion and at rest. Denies ever having to use O2 before.     Joe Arroyo is a 65 year old male, never smoker with hypertension, hyperlipidemia and moderate obstructive sleep apnea who is referred to pulmonary clinic for evaluation of dyspnea.   He reports having issues with the shortness of breath since around Christmas time when he reports having issues with his sinuses at that time. He was given a shot by his primary care doctor which helped clear his sinus congestion but continued to have the shortness of breath. He is limited with his activity during the day due to the shortness of breath.   He reports having a cough when he was acutely ill with thick brown sputum which has continued and he has thick white sputum currently. He has also noted some wheezing over the past couple of days, but not previously. He denies night time awakenings with cough, wheezing or shortness of breath. He reports having bouts of bronchitis over the past 2-3 years that cleared with antibiotics and steroids. He was provided a sample of breztri and used it for 4-5 days and did not notice any change in his symptoms.  He was diagnosed with moderate obstructive sleep apnea with an AHI of 15.6/hr from a home sleep study on 03/01/20. He has decided to wait on starting CPAP therapy.   He is a never smoker and denies second hand smoke exposure. He does not have any pets at home. Denies hobbies that pose a risk to chemicals or dusts. 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 He denies any breathing issues  prior to these past few months or during his time of work.  He does have chronic nasal congestion with post-nasal drainage. Denies seasonal allergies.   Past Medical History:  Diagnosis Date  . Hyperlipidemia 05/14/2018  . Hypertension 05/14/2018  . Kidney stones   . Sinus bradycardia 05/14/2018     Family History  Problem Relation Age of Onset  . Hyperlipidemia Mother   . Aortic stenosis Mother   . Valvular heart disease Mother   . Colon cancer Mother   . Diabetes Mellitus II Mother   . Cancer - Colon Mother   . Cancer Mother   . Stroke Mother   . Cerebrovascular Accident Mother   . Diabetes Mother   . Hypertension Mother   . Hyperlipidemia Father   . Diabetes Mellitus II Father   . Diabetes Father   . CAD Maternal Grandmother   . Congestive Heart Failure Maternal Grandmother   . Heart attack Maternal Grandmother   . Heart failure Maternal Grandmother   . Coronary artery disease Maternal Grandmother   . Hyperlipidemia Maternal Grandmother      Social History   Socioeconomic History  . Marital status: Single    Spouse name: Not on file  . Number of children: Not on file  . Years of education: Not on file  . Highest education level: Not on file  Occupational History  . Not on file  Tobacco Use  . Smoking status: Never  Smoker  . Smokeless tobacco: Never Used  Vaping Use  . Vaping Use: Never used  Substance and Sexual Activity  . Alcohol use: Yes    Comment: occ  . Drug use: Never  . Sexual activity: Not on file  Other Topics Concern  . Not on file  Social History Narrative  . Not on file   Social Determinants of Health   Financial Resource Strain: Not on file  Food Insecurity: Not on file  Transportation Needs: Not on file  Physical Activity: Not on file  Stress: Not on file  Social Connections: Not on file  Intimate Partner Violence: Not on file     Allergies  Allergen Reactions  . Bystolic [Nebivolol Hcl] Other (See Comments)    bradycardia  .  Carvedilol Other (See Comments)    bradycardia  . Pravastatin Other (See Comments)    "made my joints hurt"  . Hctz [Hydrochlorothiazide] Rash  . Hydralazine Rash  . Penicillins Rash     Outpatient Medications Prior to Visit  Medication Sig Dispense Refill  . amLODipine (NORVASC) 5 MG tablet Take 1 tablet (5 mg total) by mouth daily. 90 tablet 0  . cloNIDine (CATAPRES) 0.3 MG tablet Take 1 tablet (0.3 mg total) by mouth daily. 90 tablet 1  . esomeprazole (NEXIUM) 20 MG capsule Take 20 mg by mouth daily at 12 noon.    . ezetimibe (ZETIA) 10 MG tablet Take 1 tablet (10 mg total) by mouth daily. 90 tablet 1  . finasteride (PROSCAR) 5 MG tablet Take 1 tablet by mouth daily.    . meloxicam (MOBIC) 15 MG tablet Take 1 tablet (15 mg total) by mouth daily as needed. 90 tablet 0  . Multiple Vitamins-Minerals (CENTRUM SILVER ULTRA MENS PO) Take 1 tablet by mouth daily.    . Omega-3 Fatty Acids (FISH OIL PO) Take 1 tablet by mouth 2 (two) times daily.    . rosuvastatin (CRESTOR) 10 MG tablet Take 1 tablet (10 mg total) by mouth at bedtime. 90 tablet 1   No facility-administered medications prior to visit.    Review of Systems  Constitutional: Negative for chills, fever, malaise/fatigue and weight loss.  HENT: Negative for congestion, sinus pain and sore throat.   Eyes: Negative.   Respiratory: Positive for cough, sputum production, shortness of breath and wheezing. Negative for hemoptysis.   Cardiovascular: Negative for chest pain, palpitations, orthopnea, claudication and leg swelling.  Gastrointestinal: Negative for abdominal pain, heartburn, nausea and vomiting.  Genitourinary: Negative.   Musculoskeletal: Negative for joint pain and myalgias.  Skin: Negative for rash.  Neurological: Negative for weakness.  Endo/Heme/Allergies: Negative.   Psychiatric/Behavioral: Negative.       Objective:   Vitals:   06/04/20 0945  BP: 126/82  Pulse: 89  Temp: 98 F (36.7 C)  TempSrc:  Temporal  SpO2: 97%  Weight: 197 lb 6.4 oz (89.5 kg)  Height: 6' (1.829 m)     Physical Exam Constitutional:      General: He is not in acute distress. HENT:     Head: Normocephalic and atraumatic.  Eyes:     Extraocular Movements: Extraocular movements intact.     Conjunctiva/sclera: Conjunctivae normal.     Pupils: Pupils are equal, round, and reactive to light.  Cardiovascular:     Rate and Rhythm: Normal rate and regular rhythm.     Pulses: Normal pulses.     Heart sounds: Normal heart sounds. No murmur heard.   Pulmonary:  Effort: Pulmonary effort is normal.     Breath sounds: No wheezing.     Comments: Inspiratory squawks at right mid lung field  Abdominal:     General: Bowel sounds are normal.     Palpations: Abdomen is soft.  Musculoskeletal:     Right lower leg: No edema.     Left lower leg: No edema.  Lymphadenopathy:     Cervical: No cervical adenopathy.  Skin:    General: Skin is warm and dry.  Neurological:     General: No focal deficit present.     Mental Status: He is alert.  Psychiatric:        Mood and Affect: Mood normal.        Behavior: Behavior normal.        Thought Content: Thought content normal.        Judgment: Judgment normal.    CBC    Component Value Date/Time   WBC 6.9 06/04/2020 1033   RBC 5.18 06/04/2020 1033   HGB 15.3 06/04/2020 1033   HGB 15.6 03/06/2020 1039   HCT 45.5 06/04/2020 1033   HCT 45.7 03/06/2020 1039   PLT 215.0 06/04/2020 1033   PLT 262 03/06/2020 1039   MCV 87.7 06/04/2020 1033   MCV 86 03/06/2020 1039   MCH 29.3 03/06/2020 1039   MCHC 33.6 06/04/2020 1033   RDW 16.3 (H) 06/04/2020 1033   RDW 14.5 03/06/2020 1039   LYMPHSABS 1.0 06/04/2020 1033   LYMPHSABS 1.2 03/06/2020 1039   MONOABS 0.8 06/04/2020 1033   EOSABS 0.1 06/04/2020 1033   EOSABS 0.1 03/06/2020 1039   BASOSABS 0.0 06/04/2020 1033   BASOSABS 0.0 03/06/2020 1039   BMP Latest Ref Rng & Units 03/06/2020 11/22/2019 09/05/2019  Glucose 65  - 99 mg/dL 79 87 117(H)  BUN 8 - 27 mg/dL '13 11 14  ' Creatinine 0.76 - 1.27 mg/dL 1.02 1.04 1.20  BUN/Creat Ratio 10 - '24 13 11 12  ' Sodium 134 - 144 mmol/L 140 139 140  Potassium 3.5 - 5.2 mmol/L 4.7 4.4 4.1  Chloride 96 - 106 mmol/L 101 101 104  CO2 20 - 29 mmol/L '24 24 21  ' Calcium 8.6 - 10.2 mg/dL 10.1 9.9 9.6   Chest imaging: Report from family medicine 10/2019 reviewed, listed in HPI.  PFT: No flowsheet data found.  Echo 05/11/20: 1. Left ventricular ejection fraction, by estimation, is 60 to 65%. The  left ventricle has normal function. The left ventricle has no regional  wall motion abnormalities. There is mild left ventricular hypertrophy.  Left ventricular diastolic parameters  are consistent with Grade I diastolic dysfunction (impaired relaxation).  2. Right ventricular systolic function is normal. The right ventricular  size is normal.  3. The mitral valve is normal in structure. No evidence of mitral valve  regurgitation. No evidence of mitral stenosis.  4. The aortic valve is normal in structure. There is mild calcification  of the aortic valve. There is mild thickening of the aortic valve. Aortic  valve regurgitation is mild. No aortic stenosis is present.  5. There is moderate dilatation of the ascending aorta, measuring 45 mm.  6. The inferior vena cava is normal in size with greater than 50%  respiratory variability, suggesting right atrial pressure of 3 mmHg.    Assessment & Plan:   Shortness of breath - Plan: Pulmonary Function Test, CBC with Differential, CT Chest High Resolution, ANA,IFA RA Diag Pnl w/rflx Tit/Patn, C-reactive protein, Sed Rate (ESR), Sed Rate (ESR), C-reactive  protein, CBC with Differential  Discussion: Joe Arroyo is a 65 year old male, never smoker with hypertension, hyperlipidemia and moderate obstructive sleep apnea who is referred to pulmonary clinic for evaluation of dyspnea.   He appears to have worsening dyspnea over the past  2 months with cough, wheezing and some mucous production concerning for bronchitis vs bronchiectasis vs GERD related reactive airways disease. We will check pulmonary function tests and a CT chest scan for further evaluation. He reports not benefitting from [place] sample in the past so I am hesitant to trial him on inhaler therapy until the breathing tests and chest imaging studies have resulted.   We will check CBC, ANA, ESR and CRP for any inflammatory component.  He has opted to no try CPAP at this time for his sleep apnea. I informed him that it would be beneficial to treat his underlying sleep apnea but he wishes to hold off at this time.   We will treat him with a prolonged steroid taper of 12 days along with azithromycin.   Follow up in 2 months.  Freda Jackson, MD [facility] Office: [phone]   See Amion for Pager Details     Current Outpatient Medications:  .  amLODipine (NORVASC) 5 MG tablet, Take 1 tablet (5 mg total) by mouth daily., Disp: 90 tablet, Rfl: 0 .  azithromycin (ZITHROMAX) 250 MG tablet, Take 1 tablet (250 mg total) by mouth daily., Disp: 6 tablet, Rfl: 0 .  cloNIDine (CATAPRES) 0.3 MG tablet, Take 1 tablet (0.3 mg total) by mouth daily., Disp: 90 tablet, Rfl: 1 .  esomeprazole (NEXIUM) 20 MG capsule, Take 20 mg by mouth daily at 12 noon., Disp: , Rfl:  .  ezetimibe (ZETIA) 10 MG tablet, Take 1 tablet (10 mg total) by mouth daily., Disp: 90 tablet, Rfl: 1 .  finasteride (PROSCAR) 5 MG tablet, Take 1 tablet by mouth daily., Disp: , Rfl:  .  meloxicam (MOBIC) 15 MG tablet, Take 1 tablet (15 mg total) by mouth daily as needed., Disp: 90 tablet, Rfl: 0 .  Multiple Vitamins-Minerals (CENTRUM SILVER ULTRA MENS PO), Take 1 tablet by mouth daily., Disp: , Rfl:  .  Omega-3 Fatty Acids (FISH OIL PO), Take 1 tablet by mouth 2 (two) times daily., Disp: , Rfl:  .  predniSONE (DELTASONE) 10 MG tablet, Take 1 tablet (10 mg total) by mouth daily with  breakfast., Disp: 30 tablet, Rfl: 0 .  rosuvastatin (CRESTOR) 10 MG tablet, Take 1 tablet (10 mg total) by mouth at bedtime., Disp: 90 tablet, Rfl: 1

## 2020-06-06 ENCOUNTER — Ambulatory Visit: Payer: 59 | Admitting: Neurology

## 2020-06-06 LAB — ANA,IFA RA DIAG PNL W/RFLX TIT/PATN
Anti Nuclear Antibody (ANA): NEGATIVE
Cyclic Citrullin Peptide Ab: <16 UNITS
Rheumatoid fact SerPl-aCnc: <14 IU/mL (ref <14)

## 2020-06-08 ENCOUNTER — Telehealth: Payer: Self-pay | Admitting: Pulmonary Disease

## 2020-06-08 NOTE — Telephone Encounter (Signed)
Spoke with the pt  He is asking status of his CT being scheduled and also wants the lab results from 06/04/20  Will forward to Dr Erin Fulling to ask about the results, as well as [facility] to check on the CT

## 2020-06-08 NOTE — Telephone Encounter (Signed)
To schedule at [facility] they have to be precerted first waiting on the precert to be finished then I will call and schedule patient aware

## 2020-06-11 NOTE — Telephone Encounter (Signed)
Patient checking on scheduling CT scan. Patient phone number is [phone].

## 2020-06-12 NOTE — Telephone Encounter (Signed)
Called pt to let him know we are still working with insurance to get approval but patient is wanting his lab results also call him on [phone]

## 2020-06-12 NOTE — Telephone Encounter (Signed)
ATC LVM x 1

## 2020-06-13 NOTE — Telephone Encounter (Signed)
LMTCB

## 2020-06-14 NOTE — Telephone Encounter (Signed)
Left message for patient to call back  

## 2020-06-14 NOTE — Telephone Encounter (Signed)
Dr. Erin Fulling, please advise on results of labwork that was done after pt's OV as pt is calling requesting to know the results. Thanks!

## 2020-06-14 NOTE — Telephone Encounter (Signed)
His labs are normal.  Thanks, Wille Glaser

## 2020-06-15 NOTE — Telephone Encounter (Signed)
Left detailed msg on machine ok per DPR letting him know labs normal

## 2020-06-15 NOTE — Telephone Encounter (Signed)
Patient came by the office today for his lab results. I advised him that his labs were normal. He verbalized understanding. While in the office, he wanted to know about the status of the CT scan. I advised him that the [facility] were still working on this.

## 2020-06-18 ENCOUNTER — Ambulatory Visit: Payer: Medicare HMO | Admitting: Adult Health

## 2020-06-18 ENCOUNTER — Other Ambulatory Visit: Payer: Self-pay

## 2020-06-18 NOTE — Progress Notes (Signed)
Subjective:  Patient ID: Joe Arroyo, male    DOB: 07-Feb-1956  Age: 65 y.o. MRN: [id number]  Chief Complaint  Patient presents with  . Hypertension  . Hyperlipidemia    HPI Essential hypertension Taking amlodipine daily along with clonidine  Mixed hyperlipidemia Takes crestor 10 mg once daily at night , zetia and fish oil  OSA (obstructive sleep apnea) Mild: not on cpap   GERD without esophagitis Controlled with nexium   Dyspnea: on prednisone 10 mg once daily. Ran out of sample of breztri. Did not help.   Current Outpatient Medications on File Prior to Visit  Medication Sig Dispense Refill  . amLODipine (NORVASC) 5 MG tablet Take 1 tablet (5 mg total) by mouth daily. 90 tablet 0  . cloNIDine (CATAPRES) 0.3 MG tablet Take 1 tablet (0.3 mg total) by mouth daily. 90 tablet 1  . esomeprazole (NEXIUM) 20 MG capsule Take 20 mg by mouth daily at 12 noon.    . ezetimibe (ZETIA) 10 MG tablet Take 1 tablet (10 mg total) by mouth daily. 90 tablet 1  . finasteride (PROSCAR) 5 MG tablet Take 1 tablet by mouth daily.    . meloxicam (MOBIC) 15 MG tablet Take 1 tablet (15 mg total) by mouth daily as needed. 90 tablet 0  . Multiple Vitamins-Minerals (CENTRUM SILVER ULTRA MENS PO) Take 1 tablet by mouth daily.    . Omega-3 Fatty Acids (FISH OIL PO) Take 1 tablet by mouth 2 (two) times daily.    . predniSONE (DELTASONE) 10 MG tablet Take 1 tablet (10 mg total) by mouth daily with breakfast. 30 tablet 0  . rosuvastatin (CRESTOR) 10 MG tablet Take 1 tablet (10 mg total) by mouth at bedtime. 90 tablet 1   No current facility-administered medications on file prior to visit.   Past Medical History:  Diagnosis Date  . Hyperlipidemia 05/14/2018  . Hypertension 05/14/2018  . Kidney stones   . Sinus bradycardia 05/14/2018   Past Surgical History:  Procedure Laterality Date  . LAMINECTOMY    . MOLE REMOVAL    . PROSTATE BIOPSY    . TONSILLECTOMY AND ADENOIDECTOMY      Family History   Problem Relation Age of Onset  . Hyperlipidemia Mother   . Aortic stenosis Mother   . Valvular heart disease Mother   . Colon cancer Mother   . Diabetes Mellitus II Mother   . Cancer - Colon Mother   . Cancer Mother   . Stroke Mother   . Cerebrovascular Accident Mother   . Diabetes Mother   . Hypertension Mother   . Hyperlipidemia Father   . Diabetes Mellitus II Father   . Diabetes Father   . CAD Maternal Grandmother   . Congestive Heart Failure Maternal Grandmother   . Heart attack Maternal Grandmother   . Heart failure Maternal Grandmother   . Coronary artery disease Maternal Grandmother   . Hyperlipidemia Maternal Grandmother    Social History   Socioeconomic History  . Marital status: Single    Spouse name: Not on file  . Number of children: Not on file  . Years of education: Not on file  . Highest education level: Not on file  Occupational History  . Not on file  Tobacco Use  . Smoking status: Never Smoker  . Smokeless tobacco: Never Used  Vaping Use  . Vaping Use: Never used  Substance and Sexual Activity  . Alcohol use: Yes    Comment: occ  . Drug use: Never  .  Sexual activity: Not on file  Other Topics Concern  . Not on file  Social History Narrative  . Not on file   Social Determinants of Health   Financial Resource Strain: Not on file  Food Insecurity: Not on file  Transportation Needs: Not on file  Physical Activity: Not on file  Stress: Not on file  Social Connections: Not on file    Review of Systems  Constitutional: Positive for fatigue. Negative for chills, diaphoresis and fever.  HENT: Positive for congestion. Negative for ear pain and sore throat.   Respiratory: Positive for cough and shortness of breath.   Cardiovascular: Negative for chest pain and leg swelling.  Gastrointestinal: Negative for abdominal pain, constipation, diarrhea, nausea and vomiting.  Genitourinary: Negative for dysuria and urgency.  Musculoskeletal: Negative for  arthralgias and myalgias.  Neurological: Positive for headaches (eased since retirement.). Negative for dizziness.  Psychiatric/Behavioral: Negative for dysphoric mood.     Objective:  BP 122/80   Pulse 77   Temp (!) 97.2 F (36.2 C)   Ht 6' (1.829 m)   Wt 188 lb (85.3 kg)   SpO2 99%   BMI 25.50 kg/m   BP/Weight 06/19/2020 06/04/2020 8/67/6720  Systolic BP 947 096 283  Diastolic BP 80 82 80  Wt. (Lbs) 188 197.4 195  BMI 25.5 26.77 26.45    Physical Exam Vitals reviewed.  Constitutional:      Appearance: Normal appearance. He is normal weight.  Neck:     Vascular: No carotid bruit.  Cardiovascular:     Rate and Rhythm: Normal rate and regular rhythm.     Heart sounds: Murmur heard.    Pulmonary:     Effort: Respiratory distress present.     Breath sounds: Normal breath sounds.     Comments: Tachypnea  Abdominal:     General: Abdomen is flat. Bowel sounds are normal.     Palpations: Abdomen is soft.     Tenderness: There is no abdominal tenderness.  Neurological:     Mental Status: He is alert and oriented to person, place, and time.  Psychiatric:        Mood and Affect: Mood normal.        Behavior: Behavior normal.   6 minute walk: oxygen level remained above 99%.  Diabetic Foot Exam - Simple   No data filed      Lab Results  Component Value Date   WBC 6.9 06/04/2020   HGB 15.3 06/04/2020   HCT 45.5 06/04/2020   PLT 215.0 06/04/2020   GLUCOSE 79 03/06/2020   CHOL 240 (H) 03/06/2020   TRIG 157 (H) 03/06/2020   HDL 58 03/06/2020   LDLCALC 154 (H) 03/06/2020   ALT 27 03/06/2020   AST 27 03/06/2020   NA 140 03/06/2020   K 4.7 03/06/2020   CL 101 03/06/2020   CREATININE 1.02 03/06/2020   BUN 13 03/06/2020   CO2 24 03/06/2020   TSH 1.790 09/05/2019      Assessment & Plan:   1. Essential hypertension Well controlled.  No changes to medicines.  Continue to work on eating a healthy diet and exercise.  Labs drawn today.  - Comprehensive  metabolic panel  2. Mixed hyperlipidemia Continue to work on eating a healthy diet and exercise.  Labs drawn today.  - CBC with Differential/Platelet - Lipid panel  3. OSA (obstructive sleep apnea) Mild cpap. Defer to Dr. Erin Fulling.   4. GERD without esophagitis  continue nexium.   5. Dyspnea:  Defer to Dr. Erin Fulling.  Await ct scan of chest scheduled this Thursday.   Orders Placed This Encounter  Procedures  . CBC with Differential/Platelet  . Comprehensive metabolic panel  . Lipid panel  . HM COLONOSCOPY    Follow-up: No follow-ups on file.  An After Visit Summary was printed and given to the patient.  [name], MD [facility] [phone]

## 2020-06-19 ENCOUNTER — Other Ambulatory Visit: Payer: Self-pay

## 2020-06-19 ENCOUNTER — Ambulatory Visit (INDEPENDENT_AMBULATORY_CARE_PROVIDER_SITE_OTHER): Payer: Medicare HMO | Admitting: Family Medicine

## 2020-06-19 ENCOUNTER — Encounter: Payer: Self-pay | Admitting: Family Medicine

## 2020-06-19 VITALS — BP 122/80 | HR 77 | Temp 97.2°F | Ht 72.0 in | Wt 188.0 lb

## 2020-06-19 DIAGNOSIS — I10 Essential (primary) hypertension: Secondary | ICD-10-CM | POA: Diagnosis not present

## 2020-06-19 DIAGNOSIS — R0602 Shortness of breath: Secondary | ICD-10-CM | POA: Diagnosis not present

## 2020-06-19 DIAGNOSIS — K219 Gastro-esophageal reflux disease without esophagitis: Secondary | ICD-10-CM

## 2020-06-19 DIAGNOSIS — E782 Mixed hyperlipidemia: Secondary | ICD-10-CM

## 2020-06-19 DIAGNOSIS — G4733 Obstructive sleep apnea (adult) (pediatric): Secondary | ICD-10-CM

## 2020-06-20 ENCOUNTER — Ambulatory Visit: Payer: Medicare HMO | Admitting: Cardiology

## 2020-06-20 ENCOUNTER — Telehealth: Payer: Self-pay | Admitting: Pulmonary Disease

## 2020-06-20 LAB — CBC WITH DIFFERENTIAL/PLATELET
Basophils Absolute: 0 10*3/uL (ref 0.0–0.2)
Basos: 1 %
EOS (ABSOLUTE): 0.2 10*3/uL (ref 0.0–0.4)
Eos: 2 %
Hematocrit: 48 % (ref 37.5–51.0)
Hemoglobin: 16.3 g/dL (ref 13.0–17.7)
Immature Grans (Abs): 0 10*3/uL (ref 0.0–0.1)
Immature Granulocytes: 0 %
Lymphocytes Absolute: 2.1 10*3/uL (ref 0.7–3.1)
Lymphs: 26 %
MCH: 29.5 pg (ref 26.6–33.0)
MCHC: 34 g/dL (ref 31.5–35.7)
MCV: 87 fL (ref 79–97)
Monocytes Absolute: 1 10*3/uL — ABNORMAL HIGH (ref 0.1–0.9)
Monocytes: 12 %
Neutrophils Absolute: 4.8 10*3/uL (ref 1.4–7.0)
Neutrophils: 59 %
Platelets: 267 10*3/uL (ref 150–450)
RBC: 5.52 x10E6/uL (ref 4.14–5.80)
RDW: 14.9 % (ref 11.6–15.4)
WBC: 8.1 10*3/uL (ref 3.4–10.8)

## 2020-06-20 LAB — LIPID PANEL
Chol/HDL Ratio: 2.6 ratio (ref 0.0–5.0)
Cholesterol, Total: 179 mg/dL (ref 100–199)
HDL: 69 mg/dL (ref >39)
LDL Chol Calc (NIH): 89 mg/dL (ref 0–99)
Triglycerides: 123 mg/dL (ref 0–149)
VLDL Cholesterol Cal: 21 mg/dL (ref 5–40)

## 2020-06-20 LAB — COMPREHENSIVE METABOLIC PANEL
ALT: 53 IU/L — ABNORMAL HIGH (ref 0–44)
AST: 46 IU/L — ABNORMAL HIGH (ref 0–40)
Albumin/Globulin Ratio: 2.2 (ref 1.2–2.2)
Albumin: 5.1 g/dL — ABNORMAL HIGH (ref 3.8–4.8)
Alkaline Phosphatase: 113 IU/L (ref 44–121)
BUN/Creatinine Ratio: 13 (ref 10–24)
BUN: 16 mg/dL (ref 8–27)
Bilirubin Total: 0.6 mg/dL (ref 0.0–1.2)
CO2: 21 mmol/L (ref 20–29)
Calcium: 10.2 mg/dL (ref 8.6–10.2)
Chloride: 101 mmol/L (ref 96–106)
Creatinine, Ser: 1.27 mg/dL (ref 0.76–1.27)
Globulin, Total: 2.3 g/dL (ref 1.5–4.5)
Glucose: 75 mg/dL (ref 65–99)
Potassium: 4.1 mmol/L (ref 3.5–5.2)
Sodium: 142 mmol/L (ref 134–144)
Total Protein: 7.4 g/dL (ref 6.0–8.5)
eGFR: 62 mL/min/{1.73_m2} (ref >59)

## 2020-06-20 LAB — CARDIOVASCULAR RISK ASSESSMENT

## 2020-06-20 NOTE — Telephone Encounter (Signed)
Called [employer] and changed NPI# and the Josem Kaufmann is the same Shirlean Mylar is aware Joellen Jersey

## 2020-06-21 DIAGNOSIS — R0602 Shortness of breath: Secondary | ICD-10-CM | POA: Diagnosis not present

## 2020-06-29 ENCOUNTER — Telehealth: Payer: Self-pay | Admitting: Pulmonary Disease

## 2020-06-29 ENCOUNTER — Other Ambulatory Visit: Payer: Self-pay

## 2020-06-29 NOTE — Telephone Encounter (Signed)
JD pt is calling to get the results of the CT scan that was done at [facility] on 03/10.  Please advise. Thanks

## 2020-06-30 MED ORDER — MELOXICAM 15 MG PO TABS: 15.0000 mg | ORAL_TABLET | Freq: Every day | ORAL | 0 refills | Status: DC | PRN

## 2020-07-03 ENCOUNTER — Ambulatory Visit: Payer: Medicare HMO | Admitting: Pulmonary Disease

## 2020-07-03 NOTE — Telephone Encounter (Signed)
Pt came in the office requesting the results CT. Pt can be reached at [phone]. Please advise

## 2020-07-03 NOTE — Telephone Encounter (Signed)
I spoke with the patient today about his CT scan results.   He has been instructed to either elevate the head of the bed or buy a wedge pillow to prevent nocturnal reflux. He will opt to try the wedge pillow first.   He feels his dyspnea and fatigue are getting worse. He is still no interested in CPAP therapy. I instructed him to look into the inspire device and we can refer him in the future if he is interested in this therapy.   He has been scheduled for PFTs and follow up next month. Please move his PFTs to next week and I will be in touch with him about the results once I have them. He can keep his appointment in April next month.   Thank you, Wille Glaser

## 2020-07-03 NOTE — Telephone Encounter (Signed)
Pt has an appointment with JD this afternoon to go over CT results. Nothing further needed

## 2020-07-03 NOTE — Telephone Encounter (Signed)
Called and spoke with patient to let him know we were moving up his PFT and covid test. Patient is now scheduled for 3/28- covid test and PFT- 3/31. Nothing further needed at this time,

## 2020-07-09 ENCOUNTER — Other Ambulatory Visit (HOSPITAL_COMMUNITY)
Admission: RE | Admit: 2020-07-09 | Discharge: 2020-07-09 | Disposition: A | Discharge status: Home / Self Care | Payer: Medicare HMO | Source: Ambulatory Visit | Attending: Pulmonary Disease | Admitting: Pulmonary Disease

## 2020-07-09 DIAGNOSIS — Z20822 Contact with and (suspected) exposure to covid-19: Secondary | ICD-10-CM | POA: Insufficient documentation

## 2020-07-09 DIAGNOSIS — Z01812 Encounter for preprocedural laboratory examination: Secondary | ICD-10-CM | POA: Diagnosis not present

## 2020-07-10 LAB — SARS CORONAVIRUS 2 (TAT 6-24 HRS): SARS Coronavirus 2: NEGATIVE

## 2020-07-12 ENCOUNTER — Other Ambulatory Visit: Payer: Self-pay

## 2020-07-12 ENCOUNTER — Ambulatory Visit (INDEPENDENT_AMBULATORY_CARE_PROVIDER_SITE_OTHER): Payer: Medicare HMO | Admitting: Pulmonary Disease

## 2020-07-12 DIAGNOSIS — R0602 Shortness of breath: Secondary | ICD-10-CM

## 2020-07-12 LAB — PULMONARY FUNCTION TEST
DL/VA % pred: 112 %
DL/VA: 4.58 ml/min/mmHg/L
DLCO cor % pred: 91 %
DLCO cor: 25.74 ml/min/mmHg
DLCO unc % pred: 81 %
DLCO unc: 22.98 ml/min/mmHg
FEF 25-75 Post: 3.88 L/sec
FEF 25-75 Pre: 2.89 L/sec
FEF2575-%Change-Post: 33 %
FEF2575-%Pred-Post: 135 %
FEF2575-%Pred-Pre: 101 %
FEV1-%Change-Post: 12 %
FEV1-%Pred-Post: 88 %
FEV1-%Pred-Pre: 78 %
FEV1-Post: 3.24 L
FEV1-Pre: 2.89 L
FEV1FVC-%Change-Post: 4 %
FEV1FVC-%Pred-Pre: 108 %
FEV6-%Change-Post: 7 %
FEV6-%Pred-Post: 81 %
FEV6-%Pred-Pre: 76 %
FEV6-Post: 3.82 L
FEV6-Pre: 3.55 L
FEV6FVC-%Change-Post: 0 %
FEV6FVC-%Pred-Post: 105 %
FEV6FVC-%Pred-Pre: 105 %
FVC-%Change-Post: 7 %
FVC-%Pred-Post: 77 %
FVC-%Pred-Pre: 72 %
FVC-Post: 3.82 L
FVC-Pre: 3.56 L
Post FEV1/FVC ratio: 85 %
Post FEV6/FVC ratio: 100 %
Pre FEV1/FVC ratio: 81 %
Pre FEV6/FVC Ratio: 100 %
RV % pred: 90 %
RV: 2.24 L
TLC % pred: 83 %
TLC: 6.19 L

## 2020-07-12 NOTE — Progress Notes (Signed)
PFT done today. 

## 2020-07-19 DIAGNOSIS — C61 Malignant neoplasm of prostate: Secondary | ICD-10-CM | POA: Diagnosis not present

## 2020-07-19 DIAGNOSIS — Z87442 Personal history of urinary calculi: Secondary | ICD-10-CM | POA: Diagnosis not present

## 2020-08-02 ENCOUNTER — Other Ambulatory Visit: Payer: Self-pay | Admitting: Physician Assistant

## 2020-08-02 DIAGNOSIS — I10 Essential (primary) hypertension: Secondary | ICD-10-CM

## 2020-08-02 MED ORDER — AMLODIPINE BESYLATE 5 MG PO TABS: 5.0000 mg | ORAL_TABLET | Freq: Every day | ORAL | 0 refills | Status: DC

## 2020-08-06 ENCOUNTER — Other Ambulatory Visit (HOSPITAL_COMMUNITY): Payer: Medicare HMO

## 2020-08-08 ENCOUNTER — Other Ambulatory Visit: Payer: Self-pay

## 2020-08-08 ENCOUNTER — Ambulatory Visit: Payer: Medicare HMO | Admitting: Pulmonary Disease

## 2020-08-08 VITALS — BP 148/88 | HR 107

## 2020-08-08 DIAGNOSIS — R0602 Shortness of breath: Secondary | ICD-10-CM

## 2020-08-08 NOTE — Progress Notes (Signed)
Synopsis: Referred in February 2022 by [name], MD for dyspnea on exertion.  Subjective:   PATIENT ID: Joe Arroyo GENDER: male DOB: 1955/05/15, MRN: [id number]  HPI  Chief Complaint  Patient presents with  . Follow-up    Not improved still SOB. SOB at rest and with exertion   Joe Arroyo is a 65 year old male, never smoker with hypertension, hyperlipidemia and moderate obstructive sleep apnea who returns to pulmonary clinic for evaluation of dyspnea.   He reports continued shortness of breath at rest and with exertion.  Inflammatory work up from prior visit was negative which included ANA, CCP, and rheumatoid factor. His breathing did not improve with the steroid taper and azithromycin after the last visit. He is not currently using inhalers as this did not help his breathing in the past.   PFTs today are within normal limits although he has an isolated decrease in his FVC that is mild.   He reports noticing weakness in his bilateral upper extremities 2 weeks ago.   OV 06/04/20 He reports having issues with the shortness of breath since around Christmas time when he reports having issues with his sinuses at that time. He was given a shot by his primary care doctor which helped clear his sinus congestion but continued to have the shortness of breath. He is limited with his activity during the day due to the shortness of breath.   He reports having a cough when he was acutely ill with thick brown sputum which has continued and he has thick white sputum currently. He has also noted some wheezing over the past couple of days, but not previously. He denies night time awakenings with cough, wheezing or shortness of breath. He reports having bouts of bronchitis over the past 2-3 years that cleared with antibiotics and steroids. He was provided a sample of breztri and used it for 4-5 days and did not notice any change in his symptoms.  He was diagnosed with moderate obstructive sleep  apnea with an AHI of 15.6/hr from a home sleep study on 03/01/20. He has decided to wait on starting CPAP therapy.   He is a never smoker and denies second hand smoke exposure. He does not have any pets at home. Denies hobbies that pose a risk to chemicals or dusts. 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 He denies any breathing issues prior to these past few months or during his time of work.  He does have chronic nasal congestion with post-nasal drainage. Denies seasonal allergies.   Past Medical History:  Diagnosis Date  . Hyperlipidemia 05/14/2018  . Hypertension 05/14/2018  . Kidney stones   . Sinus bradycardia 05/14/2018     Family History  Problem Relation Age of Onset  . Hyperlipidemia Mother   . Aortic stenosis Mother   . Valvular heart disease Mother   . Colon cancer Mother   . Diabetes Mellitus II Mother   . Cancer - Colon Mother   . Cancer Mother   . Stroke Mother   . Cerebrovascular Accident Mother   . Diabetes Mother   . Hypertension Mother   . Hyperlipidemia Father   . Diabetes Mellitus II Father   . Diabetes Father   . CAD Maternal Grandmother   . Congestive Heart Failure Maternal Grandmother   . Heart attack Maternal Grandmother   . Heart failure Maternal Grandmother   . Coronary artery disease Maternal Grandmother   . Hyperlipidemia  Maternal Grandmother      Social History   Socioeconomic History  . Marital status: Single    Spouse name: Not on file  . Number of children: Not on file  . Years of education: Not on file  . Highest education level: Not on file  Occupational History  . Not on file  Tobacco Use  . Smoking status: Never Smoker  . Smokeless tobacco: Never Used  Vaping Use  . Vaping Use: Never used  Substance and Sexual Activity  . Alcohol use: Yes    Comment: occ  . Drug use: Never  . Sexual activity: Not on file  Other Topics Concern  . Not on file  Social History Narrative  .  Not on file   Social Determinants of Health   Financial Resource Strain: Not on file  Food Insecurity: Not on file  Transportation Needs: Not on file  Physical Activity: Not on file  Stress: Not on file  Social Connections: Not on file  Intimate Partner Violence: Not on file     Allergies  Allergen Reactions  . Bystolic [Nebivolol Hcl] Other (See Comments)    bradycardia  . Carvedilol Other (See Comments)    bradycardia  . Pravastatin Other (See Comments)    "made my joints hurt"  . Hctz [Hydrochlorothiazide] Rash  . Hydralazine Rash  . Penicillins Rash     Outpatient Medications Prior to Visit  Medication Sig Dispense Refill  . amLODipine (NORVASC) 5 MG tablet Take 1 tablet (5 mg total) by mouth daily. 90 tablet 0  . esomeprazole (NEXIUM) 20 MG capsule Take 20 mg by mouth daily at 12 noon.    . finasteride (PROSCAR) 5 MG tablet Take 1 tablet by mouth daily.    . Multiple Vitamins-Minerals (CENTRUM SILVER ULTRA MENS PO) Take 1 tablet by mouth daily.    . Omega-3 Fatty Acids (FISH OIL PO) Take 1 tablet by mouth 2 (two) times daily.    . predniSONE (DELTASONE) 10 MG tablet Take 1 tablet (10 mg total) by mouth daily with breakfast. 30 tablet 0  . cloNIDine (CATAPRES) 0.3 MG tablet Take 1 tablet (0.3 mg total) by mouth daily. 90 tablet 1  . ezetimibe (ZETIA) 10 MG tablet Take 1 tablet (10 mg total) by mouth daily. 90 tablet 1  . rosuvastatin (CRESTOR) 10 MG tablet Take 1 tablet (10 mg total) by mouth at bedtime. 90 tablet 1  . meloxicam (MOBIC) 15 MG tablet Take 1 tablet (15 mg total) by mouth daily as needed. (Patient not taking: Reported on 08/08/2020) 90 tablet 0   No facility-administered medications prior to visit.    Review of Systems  Constitutional: Negative for chills, fever, malaise/fatigue and weight loss.  HENT: Negative for congestion, sinus pain and sore throat.   Eyes: Negative.   Respiratory: Positive for cough, sputum production, shortness of breath and  wheezing. Negative for hemoptysis.   Cardiovascular: Negative for chest pain, palpitations, orthopnea, claudication and leg swelling.  Gastrointestinal: Negative for abdominal pain, heartburn, nausea and vomiting.  Genitourinary: Negative.   Musculoskeletal: Negative for joint pain and myalgias.  Skin: Negative for rash.  Neurological: Negative for weakness.  Endo/Heme/Allergies: Negative.   Psychiatric/Behavioral: Negative.       Objective:   Vitals:   08/08/20 1358  BP: (!) 148/88  Pulse: (!) 107  SpO2: 97%     Physical Exam Constitutional:      General: He is not in acute distress. HENT:     Head: Normocephalic  and atraumatic.  Eyes:     Extraocular Movements: Extraocular movements intact.     Conjunctiva/sclera: Conjunctivae normal.     Pupils: Pupils are equal, round, and reactive to light.  Cardiovascular:     Rate and Rhythm: Normal rate and regular rhythm.     Pulses: Normal pulses.     Heart sounds: Normal heart sounds. No murmur heard.   Pulmonary:     Effort: Pulmonary effort is normal.     Breath sounds: No wheezing.  Abdominal:     General: Bowel sounds are normal.     Palpations: Abdomen is soft.  Musculoskeletal:     Right lower leg: No edema.     Left lower leg: No edema.  Lymphadenopathy:     Cervical: No cervical adenopathy.  Skin:    General: Skin is warm and dry.  Neurological:     General: No focal deficit present.     Mental Status: He is alert.  Psychiatric:        Mood and Affect: Mood normal.        Behavior: Behavior normal.        Thought Content: Thought content normal.        Judgment: Judgment normal.    CBC    Component Value Date/Time   WBC 8.1 06/19/2020 1007   WBC 6.9 06/04/2020 1033   RBC 5.52 06/19/2020 1007   RBC 5.18 06/04/2020 1033   HGB 16.3 06/19/2020 1007   HCT 48.0 06/19/2020 1007   PLT 267 06/19/2020 1007   MCV 87 06/19/2020 1007   MCH 29.5 06/19/2020 1007   MCHC 34.0 06/19/2020 1007   MCHC 33.6  06/04/2020 1033   RDW 14.9 06/19/2020 1007   LYMPHSABS 2.1 06/19/2020 1007   MONOABS 0.8 06/04/2020 1033   EOSABS 0.2 06/19/2020 1007   BASOSABS 0.0 06/19/2020 1007   BMP Latest Ref Rng & Units 06/19/2020 03/06/2020 11/22/2019  Glucose 65 - 99 mg/dL 75 79 87  BUN 8 - 27 mg/dL 16 13 11   Creatinine 0.76 - 1.27 mg/dL 1.27 1.02 1.04  BUN/Creat Ratio 10 - 24 13 13 11   Sodium 134 - 144 mmol/L 142 140 139  Potassium 3.5 - 5.2 mmol/L 4.1 4.7 4.4  Chloride 96 - 106 mmol/L 101 101 101  CO2 20 - 29 mmol/L 21 24 24   Calcium 8.6 - 10.2 mg/dL 10.2 10.1 9.9   Chest imaging: HRCT Chest 06/21/20 - No evidence of interstitial lung disease. Mild air trapping is indicative of small airways disease. -  Large hiatal hernia.   Report from family medicine 10/2019 reviewed, listed in HPI.  PFT: PFT Results Latest Ref Rng & Units 07/12/2020  FVC-Pre L 3.56  FVC-Predicted Pre % 72  FVC-Post L 3.82  FVC-Predicted Post % 77  Pre FEV1/FVC % % 81  Post FEV1/FCV % % 85  FEV1-Pre L 2.89  FEV1-Predicted Pre % 78  FEV1-Post L 3.24  DLCO uncorrected ml/min/mmHg 22.98  DLCO UNC% % 81  DLCO corrected ml/min/mmHg 25.74  DLCO COR %Predicted % 91  DLVA Predicted % 112  TLC L 6.19  TLC % Predicted % 83  RV % Predicted % 90  2022: Within normal limits  Echo 05/11/20: 1. Left ventricular ejection fraction, by estimation, is 60 to 65%. The  left ventricle has normal function. The left ventricle has no regional  wall motion abnormalities. There is mild left ventricular hypertrophy.  Left ventricular diastolic parameters  are consistent with Grade I diastolic dysfunction (  impaired relaxation).  2. Right ventricular systolic function is normal. The right ventricular  size is normal.  3. The mitral valve is normal in structure. No evidence of mitral valve  regurgitation. No evidence of mitral stenosis.  4. The aortic valve is normal in structure. There is mild calcification  of the aortic valve. There is mild  thickening of the aortic valve. Aortic  valve regurgitation is mild. No aortic stenosis is present.  5. There is moderate dilatation of the ascending aorta, measuring 45 mm.  6. The inferior vena cava is normal in size with greater than 50%  respiratory variability, suggesting right atrial pressure of 3 mmHg.    Assessment & Plan:   Shortness of breath - Plan: Pulmonary function test, DG Sniff Test  Discussion: Benney Sommerville is a 65 year old male, never smoker with hypertension, hyperlipidemia and moderate obstructive sleep apnea who returns to pulmonary clinic for evaluation of dyspnea.   His pulmonary function tests are otherwise normal except for an isolated decrease in FVC. His symptoms of dyspnea appear out of proportion to his PFT findings and CT Chest findings. Given his new onset of upper extremity weakness, there is concern for possible neuromuscular weakness contributing to his dyspnea. Will check a sniff test to evaluate his diaphragms and will obtain MIP and MEP values.   He has mild air trapping on HRCT chest and some response to bronchodilators on his PFTs, so we will consider another trial of inhaler therapy in the future pending the above work up.   He also has a large hiatal hernia leading to GERD symptoms. He has been instructed to elevate the head of his bed to reduce nocturnal symptoms. 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  Follow up in 1 month.  Freda Jackson, MD [facility] Office: [phone]     Current Outpatient Medications:  .  amLODipine (NORVASC) 5 MG tablet, Take 1 tablet (5 mg total) by mouth daily., Disp: 90 tablet, Rfl: 0 .  esomeprazole (NEXIUM) 20 MG capsule, Take 20 mg by mouth daily at 12 noon., Disp: , Rfl:  .  finasteride (PROSCAR) 5 MG tablet, Take 1 tablet by mouth daily., Disp: , Rfl:  .   Multiple Vitamins-Minerals (CENTRUM SILVER ULTRA MENS PO), Take 1 tablet by mouth daily., Disp: , Rfl:  .  Omega-3 Fatty Acids (FISH OIL PO), Take 1 tablet by mouth 2 (two) times daily., Disp: , Rfl:  .  predniSONE (DELTASONE) 10 MG tablet, Take 1 tablet (10 mg total) by mouth daily with breakfast., Disp: 30 tablet, Rfl: 0 .  cloNIDine (CATAPRES) 0.3 MG tablet, Take 1 tablet (0.3 mg total) by mouth daily., Disp: 90 tablet, Rfl: 1 .  ezetimibe (ZETIA) 10 MG tablet, Take 1 tablet (10 mg total) by mouth daily., Disp: 90 tablet, Rfl: 1 .  meloxicam (MOBIC) 15 MG tablet, Take 1 tablet (15 mg total) by mouth daily as needed. (Patient not taking: Reported on 08/08/2020), Disp: 90 tablet, Rfl: 0 .  rosuvastatin (CRESTOR) 10 MG tablet, Take 1 tablet (10 mg total) by mouth at bedtime., Disp: 90 tablet, Rfl: 1

## 2020-08-08 NOTE — Patient Instructions (Signed)
We will schedule you for inspiratory and expiratory effort testing.   We will schedule you for a SNIF test to check the function of your diaphragms.

## 2020-08-13 ENCOUNTER — Ambulatory Visit (HOSPITAL_COMMUNITY)
Admission: RE | Admit: 2020-08-13 | Discharge: 2020-08-13 | Disposition: A | Discharge status: Home / Self Care | Payer: Medicare HMO | Source: Ambulatory Visit | Attending: Pulmonary Disease | Admitting: Pulmonary Disease

## 2020-08-13 ENCOUNTER — Other Ambulatory Visit: Payer: Self-pay

## 2020-08-13 DIAGNOSIS — J986 Disorders of diaphragm: Secondary | ICD-10-CM | POA: Diagnosis not present

## 2020-08-13 DIAGNOSIS — R0602 Shortness of breath: Secondary | ICD-10-CM | POA: Insufficient documentation

## 2020-08-20 ENCOUNTER — Other Ambulatory Visit: Payer: Self-pay

## 2020-08-20 MED ORDER — EZETIMIBE 10 MG PO TABS: 10.0000 mg | ORAL_TABLET | Freq: Every day | ORAL | 1 refills | Status: DC

## 2020-08-31 ENCOUNTER — Other Ambulatory Visit: Payer: Self-pay

## 2020-08-31 MED ORDER — CLONIDINE HCL 0.3 MG PO TABS: 0.3000 mg | ORAL_TABLET | Freq: Every day | ORAL | 1 refills | Status: DC

## 2020-09-03 ENCOUNTER — Other Ambulatory Visit: Payer: Self-pay

## 2020-09-03 DIAGNOSIS — E782 Mixed hyperlipidemia: Secondary | ICD-10-CM

## 2020-09-04 ENCOUNTER — Encounter: Payer: Self-pay | Admitting: Pulmonary Disease

## 2020-09-04 MED ORDER — ROSUVASTATIN CALCIUM 10 MG PO TABS: 10.0000 mg | ORAL_TABLET | Freq: Every day | ORAL | 1 refills | Status: DC

## 2020-09-04 NOTE — Telephone Encounter (Signed)
Pharmacy calling states they had to give pt 3 day supply, he is out of medication.   Joe Arroyo, [place] 09/04/20 9:26 AM

## 2020-09-14 ENCOUNTER — Other Ambulatory Visit (HOSPITAL_COMMUNITY): Payer: Medicare HMO

## 2020-09-21 ENCOUNTER — Other Ambulatory Visit: Payer: Self-pay

## 2020-09-21 ENCOUNTER — Other Ambulatory Visit (HOSPITAL_COMMUNITY)
Admission: RE | Admit: 2020-09-21 | Discharge: 2020-09-21 | Disposition: A | Discharge status: Home / Self Care | Payer: Medicare HMO | Source: Ambulatory Visit | Attending: Pulmonary Disease | Admitting: Pulmonary Disease

## 2020-09-21 ENCOUNTER — Ambulatory Visit: Payer: Medicare HMO

## 2020-09-21 DIAGNOSIS — Z01812 Encounter for preprocedural laboratory examination: Secondary | ICD-10-CM | POA: Diagnosis not present

## 2020-09-21 DIAGNOSIS — E782 Mixed hyperlipidemia: Secondary | ICD-10-CM

## 2020-09-21 DIAGNOSIS — Z20822 Contact with and (suspected) exposure to covid-19: Secondary | ICD-10-CM | POA: Insufficient documentation

## 2020-09-21 LAB — SARS CORONAVIRUS 2 (TAT 6-24 HRS): SARS Coronavirus 2: NEGATIVE

## 2020-09-21 MED ORDER — ROSUVASTATIN CALCIUM 10 MG PO TABS: 10.0000 mg | ORAL_TABLET | Freq: Every day | ORAL | 0 refills | Status: DC

## 2020-09-24 ENCOUNTER — Ambulatory Visit: Payer: Medicare HMO | Admitting: Pulmonary Disease

## 2020-09-24 ENCOUNTER — Other Ambulatory Visit: Payer: Self-pay

## 2020-09-24 ENCOUNTER — Telehealth: Payer: Self-pay | Admitting: Pulmonary Disease

## 2020-09-24 NOTE — Progress Notes (Signed)
Established Patient Office Visit  Subjective:  Patient ID: Joe Arroyo, male    DOB: [birth year]  Age: 65 y.o. MRN: [id number]  CC: Hypertension follow-up  HPI Joe Arroyo presents for follow-up of hypertension and hyperlipidemia. He has had recent routine eye and dental exam. He sees Joe Nila Nephew for prostate health. Joe Arroyo has experienced dyspnea with cough for several months. He is currently being followed by [facility]. He states he is frustrated that he he does not have a diagnosis to explain dyspnea. Joe Arroyo tells me that he is unable to exercise due to dyspnea.   He was last seen for hypertension 3 months ago.  BP at that visit was 122/80. Management since that visit includes Norvasc 5 mg and Clonidine 0.3 mg.  He reports excellent compliance with treatment. He is not having side effects.  He is following a Low Sodium diet. He is not exercising. He does not smoke.  Use of agents associated with hypertension: NSAIDS.   Outside blood pressures are 130s/80s. Symptoms: No chest pain No chest pressure  No palpitations No syncope  Yes dyspnea No orthopnea  No paroxysmal nocturnal dyspnea No lower extremity edema   Pertinent labs: Lab Results  Component Value Date   CHOL 179 06/19/2020   HDL 69 06/19/2020   LDLCALC 89 06/19/2020   TRIG 123 06/19/2020   CHOLHDL 2.6 06/19/2020   Lab Results  Component Value Date   NA 142 06/19/2020   K 4.1 06/19/2020   CREATININE 1.27 06/19/2020   GFRNONAA 76 03/06/2020   GFRAA 88 03/06/2020   GLUCOSE 75 06/19/2020     The 10-year ASCVD risk score Joe Bussing DC Jr., Joe al., Joe Arroyo) is: 15.8%    Lipid/Cholesterol, Follow-up  Last lipid panel Other pertinent labs  Lab Results  Component Value Date   CHOL 179 06/19/2020   HDL 69 06/19/2020   LDLCALC 89 06/19/2020   TRIG 123 06/19/2020   CHOLHDL 2.6 06/19/2020   Lab Results  Component Value Date   ALT 53 (H) 06/19/2020   AST 46 (H) 06/19/2020   PLT 267 06/19/2020   TSH 1.790  09/05/2019     He was last seen for this 3 months ago.  Management since that visit includes Zetia and Crestor.  He reports good compliance with treatment. He is not having side effects.   Symptoms: No chest pain No chest pressure/discomfort  Yes dyspnea No lower extremity edema  No numbness or tingling of extremity No orthopnea  No palpitations No paroxysmal nocturnal dyspnea  No speech difficulty No syncope   Current diet: in general, a "healthy" diet   Current exercise: none  The 10-year ASCVD risk score Joe Bussing DC Jr., Joe al., Joe Arroyo) is: 15.8%   GERD, Follow up:  The patient was last seen for GERD 3 months ago. Changes made since that visit include Nexium 20 mg daily .  He reports excellent compliance with treatment. He is not having side effects. none.  He IS experiencing cough. He is NOT experiencing bilious reflux   Past Medical History:  Diagnosis Date   Hyperlipidemia 05/14/2018   Hypertension 05/14/2018   Kidney stones    Sinus bradycardia 05/14/2018    Past Surgical History:  Procedure Laterality Date   LAMINECTOMY     MOLE REMOVAL     PROSTATE BIOPSY     TONSILLECTOMY AND ADENOIDECTOMY      Family History  Problem Relation Age of Onset   Hyperlipidemia Mother    Aortic stenosis Mother  Valvular heart disease Mother    Colon cancer Mother    Diabetes Mellitus II Mother    Cancer - Colon Mother    Cancer Mother    Stroke Mother    Cerebrovascular Accident Mother    Diabetes Mother    Hypertension Mother    Hyperlipidemia Father    Diabetes Mellitus II Father    Diabetes Father    CAD Maternal Grandmother    Congestive Heart Failure Maternal Grandmother    Heart attack Maternal Grandmother    Heart failure Maternal Grandmother    Coronary artery disease Maternal Grandmother    Hyperlipidemia Maternal Grandmother     Social History   Socioeconomic History   Marital status: Single    Spouse name: Not on file   Number of children: Not on  file   Years of education: Not on file   Highest education level: Not on file  Occupational History   Not on file  Tobacco Use   Smoking status: Never   Smokeless tobacco: Never  Vaping Use   Vaping Use: Never used  Substance and Sexual Activity   Alcohol use: Yes    Comment: occ   Drug use: Never   Sexual activity: Not on file  Other Topics Concern   Not on file  Social History Narrative   Not on file   Social Determinants of Health   Financial Resource Strain: Not on file  Food Insecurity: Not on file  Transportation Needs: Not on file  Physical Activity: Not on file  Stress: Not on file  Social Connections: Not on file  Intimate Partner Violence: Not on file    Outpatient Medications Prior to Visit  Medication Sig Dispense Refill   amLODipine (NORVASC) 5 MG tablet Take 1 tablet (5 mg total) by mouth daily. 90 tablet 0   cloNIDine (CATAPRES) 0.3 MG tablet Take 1 tablet (0.3 mg total) by mouth daily. 90 tablet 1   esomeprazole (NEXIUM) 20 MG capsule Take 20 mg by mouth daily at 12 noon.     ezetimibe (ZETIA) 10 MG tablet Take 1 tablet (10 mg total) by mouth daily. 90 tablet 1   finasteride (PROSCAR) 5 MG tablet Take 1 tablet by mouth daily.     meloxicam (MOBIC) 15 MG tablet Take 1 tablet (15 mg total) by mouth daily as needed. 90 tablet 0   Multiple Vitamins-Minerals (CENTRUM SILVER ULTRA MENS PO) Take 1 tablet by mouth daily.     Omega-3 Fatty Acids (FISH OIL PO) Take 1 tablet by mouth 2 (two) times daily.     predniSONE (DELTASONE) 10 MG tablet Take 1 tablet (10 mg total) by mouth daily with breakfast. 30 tablet 0   rosuvastatin (CRESTOR) 10 MG tablet Take 1 tablet (10 mg total) by mouth at bedtime. 100 tablet 0   No facility-administered medications prior to visit.    Allergies  Allergen Reactions   Bystolic [Nebivolol Hcl] Other (See Comments)    bradycardia   Carvedilol Other (See Comments)    bradycardia   Pravastatin Other (See Comments)    "made my  joints hurt"   Hctz [Hydrochlorothiazide] Rash   Hydralazine Rash   Penicillins Rash    ROS Review of Systems  Constitutional:  Positive for fatigue. Negative for appetite change and unexpected weight change.  HENT:  Positive for postnasal drip and rhinorrhea. Negative for congestion, ear pain, sinus pressure, sinus pain and tinnitus.   Eyes:  Negative for pain.  Respiratory:  Positive for  cough and shortness of breath.   Cardiovascular:  Negative for chest pain, palpitations and leg swelling.  Gastrointestinal:  Negative for abdominal pain, constipation, diarrhea, nausea and vomiting.       GERD  Endocrine: Negative for cold intolerance, heat intolerance, polydipsia, polyphagia and polyuria.  Genitourinary:  Negative for dysuria, frequency and hematuria.  Musculoskeletal:  Positive for back pain. Negative for arthralgias, joint swelling and myalgias.  Skin:  Negative for rash.  Allergic/Immunologic: Negative for environmental allergies.  Neurological:  Positive for dizziness, light-headedness and headaches.  Hematological:  Negative for adenopathy.  Psychiatric/Behavioral:  Negative for decreased concentration and sleep disturbance. The patient is not nervous/anxious.      Objective:    Physical Exam Vitals reviewed.  Constitutional:      Appearance: Normal appearance.  HENT:     Head: Normocephalic.     Right Ear: There is impacted cerumen.     Left Ear: Tympanic membrane normal.     Nose: Congestion and rhinorrhea present.     Mouth/Throat:     Mouth: Mucous membranes are dry.     Pharynx: Posterior oropharyngeal erythema present.  Cardiovascular:     Rate and Rhythm: Normal rate and regular rhythm.     Pulses: Normal pulses.     Heart sounds: Normal heart sounds.  Pulmonary:     Effort: Pulmonary effort is normal.     Breath sounds: Normal breath sounds.     Comments: Tachypnea noted Abdominal:     General: Bowel sounds are normal.     Palpations: Abdomen is soft.   Musculoskeletal:        General: Normal range of motion.     Cervical back: Neck supple.  Skin:    General: Skin is warm and dry.     Capillary Refill: Capillary refill takes less than 2 seconds.  Neurological:     General: No focal deficit present.     Mental Status: He is alert and oriented to person, place, and time.  Psychiatric:        Mood and Affect: Mood normal.        Behavior: Behavior normal.   BP (!) 148/72 (BP Location: Left Arm, Patient Position: Sitting)   Pulse (!) 59   Temp (!) 97.5 F (36.4 C) (Temporal)   Ht 6' (1.829 m)   Wt 197 lb (89.4 kg)   SpO2 97%   BMI 26.72 kg/m  Wt Readings from Last 3 Encounters:  09/25/20 197 lb (89.4 kg)  06/19/20 188 lb (85.3 kg)  06/04/20 197 lb 6.4 oz (89.5 kg)     Health Maintenance Due  Topic Date Due   Hepatitis C Screening  Never done   Zoster Vaccines- Shingrix (2 of 2) 08/24/2018   PNA vac Low Risk Adult (1 of 2 - PCV13) Never done   COVID-19 Vaccine (4 - Booster for Moderna series) 08/25/2020     Lab Results  Component Value Date   TSH 1.790 09/05/2019   Lab Results  Component Value Date   WBC 8.1 06/19/2020   HGB 16.3 06/19/2020   HCT 48.0 06/19/2020   MCV 87 06/19/2020   PLT 267 06/19/2020   Lab Results  Component Value Date   NA 142 06/19/2020   K 4.1 06/19/2020   CO2 21 06/19/2020   GLUCOSE 75 06/19/2020   BUN 16 06/19/2020   CREATININE 1.27 06/19/2020   BILITOT 0.6 06/19/2020   ALKPHOS 113 06/19/2020   AST 46 (H) 06/19/2020  ALT 53 (H) 06/19/2020   PROT 7.4 06/19/2020   ALBUMIN 5.1 (H) 06/19/2020   CALCIUM 10.2 06/19/2020   EGFR 62 06/19/2020   Lab Results  Component Value Date   CHOL 179 06/19/2020   Lab Results  Component Value Date   HDL 69 06/19/2020   Lab Results  Component Value Date   LDLCALC 89 06/19/2020   Lab Results  Component Value Date   TRIG 123 06/19/2020   Lab Results  Component Value Date   CHOLHDL 2.6 06/19/2020      Assessment & Plan:   1.  Essential hypertension-not at goal - CBC with Differential/Platelet - Comprehensive metabolic panel - TSH -Continue Amlodipine 10 mg and Clonidine 0.3 mg daily -Heart healthy diet -Increase physical activity  2. Mixed hyperlipidemia-well controlled - Lipid panel -Continue Crestor 10 mg and Zetia 10 mg -Heart healthy diet  3. GERD without esophagitis-well controlled - CBC with Differential/Platelet - Comprehensive metabolic panel  -Nexium 20 mg  Continue prescribed medications Follow-up with pulmonology and urology as scheduled Follow-up 3 months fasting with Joe Arroyo    Follow-up: 62-month   I,Lauren M Auman,acting as a scribe for [employer] NP.,have documented all relevant documentation on the behalf of SRip Harbour NP,as directed by  SRip Harbour NP while in the presence of SRip Harbour NP.   I, SRip Harbour NP, have reviewed all documentation for this visit. The documentation on 09/25/20 for the exam, diagnosis, procedures, and orders are all accurate and complete.    Signed,  SJerrell Belfast DNP

## 2020-09-24 NOTE — Telephone Encounter (Signed)
Called and spoke with patient, he states he went today for his HR CT scan at [facility], however, he was scheduled for the wrong test.  He states he is having the correct test on Thursday of this week 09/27/20 at [facility] and wants to have a f/u with Dr. Erin Fulling as soon as possible.  Advised that Dr. Erin Fulling is not here next week and then he is booked the following week, his first available will be 6/27.  Advised that we can get him in to see one of our NP to review the results of his HR CT.  He was agreeable to seeing a NP, scheduled him to see Beth at 4 pm on 6/17, advised to arrive by 3:45 pm for check in.  He verbalized understanding.  Nothing further needed.

## 2020-09-25 ENCOUNTER — Ambulatory Visit: Payer: Medicare HMO | Admitting: Family Medicine

## 2020-09-25 ENCOUNTER — Ambulatory Visit (INDEPENDENT_AMBULATORY_CARE_PROVIDER_SITE_OTHER): Payer: Medicare HMO | Admitting: Nurse Practitioner

## 2020-09-25 ENCOUNTER — Encounter: Payer: Self-pay | Admitting: Nurse Practitioner

## 2020-09-25 VITALS — BP 148/72 | HR 59 | Temp 97.5°F | Ht 72.0 in | Wt 197.0 lb

## 2020-09-25 DIAGNOSIS — H6121 Impacted cerumen, right ear: Secondary | ICD-10-CM | POA: Diagnosis not present

## 2020-09-25 DIAGNOSIS — K219 Gastro-esophageal reflux disease without esophagitis: Secondary | ICD-10-CM | POA: Diagnosis not present

## 2020-09-25 DIAGNOSIS — E782 Mixed hyperlipidemia: Secondary | ICD-10-CM

## 2020-09-25 DIAGNOSIS — I10 Essential (primary) hypertension: Secondary | ICD-10-CM

## 2020-09-25 NOTE — Patient Instructions (Signed)
Continue prescribed medications Follow-up with pulmonology and urology as scheduled Follow-up 3 months fasting with Dr Tobie Poet   Preventive Care 22 Years and Older, Male Preventive care refers to lifestyle choices and visits with your health care provider that can promote health and wellness. This includes: A yearly physical exam. This is also called an annual wellness visit. Regular dental and eye exams. Immunizations. Screening for certain conditions. Healthy lifestyle choices, such as: Eating a healthy diet. Getting regular exercise. Not using drugs or products that contain nicotine and tobacco. Limiting alcohol use. What can I expect for my preventive care visit? Physical exam Your health care provider will check your: Height and weight. These may be used to calculate your BMI (body mass index). BMI is a measurement that tells if you are at a healthy weight. Heart rate and blood pressure. Body temperature. Skin for abnormal spots. Counseling Your health care provider may ask you questions about your: Past medical problems. Family's medical history. Alcohol, tobacco, and drug use. Emotional well-being. Home life and relationship well-being. Sexual activity. Diet, exercise, and sleep habits. History of falls. Memory and ability to understand (cognition). Work and work Statistician. Access to firearms. What immunizations do I need?  Vaccines are usually given at various ages, according to a schedule. Your health care provider will recommend vaccines for you based on your age, medicalhistory, and lifestyle or other factors, such as travel or where you work. What tests do I need? Blood tests Lipid and cholesterol levels. These may be checked every 5 years, or more often depending on your overall health. Hepatitis C test. Hepatitis B test. Screening Lung cancer screening. You may have this screening every year starting at age 68 if you have a 30-pack-year history of smoking and  currently smoke or have quit within the past 15 years. Colorectal cancer screening. All adults should have this screening starting at age 67 and continuing until age 81. Your health care provider may recommend screening at age 62 if you are at increased risk. You will have tests every 1-10 years, depending on your results and the type of screening test. Prostate cancer screening. Recommendations will vary depending on your family history and other risks. Genital exam to check for testicular cancer or hernias. Diabetes screening. This is done by checking your blood sugar (glucose) after you have not eaten for a while (fasting). You may have this done every 1-3 years. Abdominal aortic aneurysm (AAA) screening. You may need this if you are a current or former smoker. STD (sexually transmitted disease) testing, if you are at risk. Follow these instructions at home: Eating and drinking  Eat a diet that includes fresh fruits and vegetables, whole grains, lean protein, and low-fat dairy products. Limit your intake of foods with high amounts of sugar, saturated fats, and salt. Take vitamin and mineral supplements as recommended by your health care provider. Do not drink alcohol if your health care provider tells you not to drink. If you drink alcohol: Limit how much you have to 0-2 drinks a day. Be aware of how much alcohol is in your drink. In the [place] one drink equals one 12 oz bottle of beer (355 mL), one 5 oz glass of wine (148 mL), or one 1 oz glass of hard liquor (44 mL).  Lifestyle Take daily care of your teeth and gums. Brush your teeth every morning and night with fluoride toothpaste. Floss one time each day. Stay active. Exercise for at least 30 minutes 5 or more days  each week. Do not use any products that contain nicotine or tobacco, such as cigarettes, e-cigarettes, and chewing tobacco. If you need help quitting, ask your health care provider. Do not use drugs. If you are sexually  active, practice safe sex. Use a condom or other form of protection to prevent STIs (sexually transmitted infections). Talk with your health care provider about taking a low-dose aspirin or statin. Find healthy ways to cope with stress, such as: Meditation, yoga, or listening to music. Journaling. Talking to a trusted person. Spending time with friends and family. Safety Always wear your seat belt while driving or riding in a vehicle. Do not drive: If you have been drinking alcohol. Do not ride with someone who has been drinking. When you are tired or distracted. While texting. Wear a helmet and other protective equipment during sports activities. If you have firearms in your house, make sure you follow all gun safety procedures. What's next? Visit your health care provider once a year for an annual wellness visit. Ask your health care provider how often you should have your eyes and teeth checked. Stay up to date on all vaccines. This information is not intended to replace advice given to you by your health care provider. Make sure you discuss any questions you have with your healthcare provider. Document Revised: 12/28/2018 Document Reviewed: 03/25/2018 Elsevier Patient Education  2022 [employer].

## 2020-09-26 ENCOUNTER — Telehealth: Payer: Self-pay | Admitting: Pulmonary Disease

## 2020-09-26 LAB — COMPREHENSIVE METABOLIC PANEL
ALT: 41 IU/L (ref 0–44)
AST: 38 IU/L (ref 0–40)
Albumin/Globulin Ratio: 1.8 (ref 1.2–2.2)
Albumin: 4.7 g/dL (ref 3.8–4.8)
Alkaline Phosphatase: 134 IU/L — ABNORMAL HIGH (ref 44–121)
BUN/Creatinine Ratio: 10 (ref 10–24)
BUN: 13 mg/dL (ref 8–27)
Bilirubin Total: 0.5 mg/dL (ref 0.0–1.2)
CO2: 20 mmol/L (ref 20–29)
Calcium: 10.1 mg/dL (ref 8.6–10.2)
Chloride: 102 mmol/L (ref 96–106)
Creatinine, Ser: 1.26 mg/dL (ref 0.76–1.27)
Globulin, Total: 2.6 g/dL (ref 1.5–4.5)
Glucose: 91 mg/dL (ref 65–99)
Potassium: 4.8 mmol/L (ref 3.5–5.2)
Sodium: 141 mmol/L (ref 134–144)
Total Protein: 7.3 g/dL (ref 6.0–8.5)
eGFR: 63 mL/min/{1.73_m2} (ref >59)

## 2020-09-26 LAB — LIPID PANEL
Chol/HDL Ratio: 2.8 ratio (ref 0.0–5.0)
Cholesterol, Total: 145 mg/dL (ref 100–199)
HDL: 52 mg/dL (ref >39)
LDL Chol Calc (NIH): 71 mg/dL (ref 0–99)
Triglycerides: 126 mg/dL (ref 0–149)
VLDL Cholesterol Cal: 22 mg/dL (ref 5–40)

## 2020-09-26 LAB — CBC WITH DIFFERENTIAL/PLATELET
Basophils Absolute: 0 10*3/uL (ref 0.0–0.2)
Basos: 1 %
EOS (ABSOLUTE): 0.1 10*3/uL (ref 0.0–0.4)
Eos: 2 %
Hematocrit: 46.4 % (ref 37.5–51.0)
Hemoglobin: 15.6 g/dL (ref 13.0–17.7)
Immature Grans (Abs): 0 10*3/uL (ref 0.0–0.1)
Immature Granulocytes: 0 %
Lymphocytes Absolute: 1.3 10*3/uL (ref 0.7–3.1)
Lymphs: 22 %
MCH: 28.9 pg (ref 26.6–33.0)
MCHC: 33.6 g/dL (ref 31.5–35.7)
MCV: 86 fL (ref 79–97)
Monocytes Absolute: 0.8 10*3/uL (ref 0.1–0.9)
Monocytes: 14 %
Neutrophils Absolute: 3.7 10*3/uL (ref 1.4–7.0)
Neutrophils: 61 %
Platelets: 239 10*3/uL (ref 150–450)
RBC: 5.39 x10E6/uL (ref 4.14–5.80)
RDW: 14.3 % (ref 11.6–15.4)
WBC: 6 10*3/uL (ref 3.4–10.8)

## 2020-09-26 LAB — CARDIOVASCULAR RISK ASSESSMENT

## 2020-09-26 LAB — TSH: TSH: 3.15 u[IU]/mL (ref 0.450–4.500)

## 2020-09-26 NOTE — Telephone Encounter (Signed)
Called and spoke with patient. He stated that he had seen his PCP yesterday and she was not happy that he has been struggling with SOB since January 2022 and does not have a diagnosis yet. She recommended that he switch over to Dr. Valeta Harms.   I advised him of the office policy for changing providers, he verbalized understanding.   Dr. Valeta Harms, please advise if you are ok with taking him on as a new patient.   Dr. Erin Fulling, please advise if you are ok with him switching to Dr. Valeta Harms.

## 2020-09-26 NOTE — Telephone Encounter (Addendum)
Yes, it is ok if patient wants to switch providers.   Patient was not scheduled correctly for the testing I had ordered at his last visit that he was supposed to follow up with me this past week. I ordered MIP and MEP testing which is only done at the hospital and he was scheduled for this at the office. He has been unwilling to use CPAP for his moderate obstructive sleep apnea and he was not very keen on trying inhaler therapy again, so we held off on this.    Joe Arroyo

## 2020-09-26 NOTE — Telephone Encounter (Deleted)
Patient also unwilling to do CPAP therapy for his moderate obstructive sleep apnea which we discussed could be leading to his day time dyspnea.   Joe Arroyo

## 2020-09-27 DIAGNOSIS — R0602 Shortness of breath: Secondary | ICD-10-CM | POA: Diagnosis not present

## 2020-09-27 LAB — PULMONARY FUNCTION TEST

## 2020-09-28 ENCOUNTER — Encounter: Payer: Self-pay | Admitting: Primary Care

## 2020-09-28 ENCOUNTER — Ambulatory Visit: Payer: Medicare HMO | Admitting: Primary Care

## 2020-09-28 ENCOUNTER — Other Ambulatory Visit: Payer: Self-pay

## 2020-09-28 VITALS — BP 150/90 | HR 115 | Ht 72.0 in | Wt 198.6 lb

## 2020-09-28 DIAGNOSIS — K449 Diaphragmatic hernia without obstruction or gangrene: Secondary | ICD-10-CM

## 2020-09-28 DIAGNOSIS — R059 Cough, unspecified: Secondary | ICD-10-CM

## 2020-09-28 DIAGNOSIS — R0602 Shortness of breath: Secondary | ICD-10-CM

## 2020-09-28 DIAGNOSIS — K219 Gastro-esophageal reflux disease without esophagitis: Secondary | ICD-10-CM

## 2020-09-28 DIAGNOSIS — G4733 Obstructive sleep apnea (adult) (pediatric): Secondary | ICD-10-CM

## 2020-09-28 HISTORY — DX: Diaphragmatic hernia without obstruction or gangrene: K44.9

## 2020-09-28 HISTORY — DX: Obstructive sleep apnea (adult) (pediatric): G47.33

## 2020-09-28 HISTORY — DX: Gastro-esophageal reflux disease without esophagitis: K21.9

## 2020-09-28 MED ORDER — FLUTICASONE FUROATE-VILANTEROL 100-25 MCG/INH IN AEPB: 1.0000 | INHALATION_SPRAY | Freq: Every day | RESPIRATORY_TRACT | 0 refills | Status: DC

## 2020-09-28 MED ORDER — OMEPRAZOLE 20 MG PO CPDR: 20.0000 mg | DELAYED_RELEASE_CAPSULE | Freq: Two times a day (BID) | ORAL | 2 refills | Status: DC

## 2020-09-28 MED ORDER — FLUTICASONE FUROATE-VILANTEROL 100-25 MCG/INH IN AEPB: 1.0000 | INHALATION_SPRAY | Freq: Every day | RESPIRATORY_TRACT | 2 refills | Status: DC

## 2020-09-28 NOTE — Progress Notes (Signed)
@Patient  ID: Joe Arroyo, male    DOB: 1956/02/03, 65 y.o.   MRN: [id number]  Chief Complaint  Patient presents with   Follow-up    Wants to discuss options for cough    Referring provider: [name], MD  HPI: 65 year old male, never smoked. PMH significant for HTN, hyperlipidemia, moderate OSA. Patient of Dr. Erin Fulling, last seen on 08/08/20.   09/28/2020- Interim hx  Patient presents today for follow-up. He saw Dr. Erin Fulling in April and was ordered for [facility] testing and MIP/MEP valves, undertunately testing was ordered incorrectly and had to be rescheduled. He has mild air trapping on HRCT and some response to BD on PFTs. Patient saw his PCP on 09/25/20, patient has been having shortness sof breath symptoms since January 2022. His PCP is wanting him to switch providers from Dr. Erin Fulling to Dr. Valeta Harms.    He has had shortness of breath and cough since December 2021. Shortness of breath occurs at all times; with rest and activity. States that this is progressively getting worse. Cough is productive with white-clear mucus. He has some associated chest tightness and reflux. He takes nexium once a day. He had breathing test yesterday and was told that this was normal. Requesting HRCT results and MIP/MEP breathing test testing from [place]. Sniff test was normal. He was frustrated with lack of response from our office with testing results and length of time in between visits. I explained this was likely d.t testing being down at outside facility [facility]). He will be happy to stay with Dr. Erin Fulling.      TESTING: 08/13/20 [facility] Sniff test >> No evidence for hemidiaphragmatic paralysis. Normal bilateral hemidiaphragmatic excursion.  07/12/20 PFTs- FVC 3.82 (77%), FEV1 3.24 (88%), ratio 85, TLC 83%, DLCOunc 81 Mildly reduced FVC with positive BD response    Allergies  Allergen Reactions   Bystolic [Nebivolol Hcl] Other (See Comments)    bradycardia   Carvedilol Other (See Comments)     bradycardia   Pravastatin Other (See Comments)    "made my joints hurt"   Hctz [Hydrochlorothiazide] Rash   Hydralazine Rash   Penicillins Rash    Immunization History  Administered Date(s) Administered   Fluad Quad(high Dose 65+) 12/27/2019   Influenza,inj,Quad PF,6+ Mos 12/22/2017   Moderna Sars-Covid-2 Vaccination 05/23/2019, 06/20/2019, 04/27/2020   Tdap 12/22/2017   Zoster Recombinat (Shingrix) 06/29/2018    Past Medical History:  Diagnosis Date   Hyperlipidemia 05/14/2018   Hypertension 05/14/2018   Kidney stones    Sinus bradycardia 05/14/2018    Tobacco History: Social History   Tobacco Use  Smoking Status Never  Smokeless Tobacco Never   Counseling given: Not Answered   Outpatient Medications Prior to Visit  Medication Sig Dispense Refill   amLODipine (NORVASC) 5 MG tablet Take 1 tablet (5 mg total) by mouth daily. 90 tablet 0   cloNIDine (CATAPRES) 0.3 MG tablet Take 1 tablet (0.3 mg total) by mouth daily. 90 tablet 1   ezetimibe (ZETIA) 10 MG tablet Take 1 tablet (10 mg total) by mouth daily. 90 tablet 1   finasteride (PROSCAR) 5 MG tablet Take 1 tablet by mouth daily.     meloxicam (MOBIC) 15 MG tablet Take 1 tablet (15 mg total) by mouth daily as needed. 90 tablet 0   Multiple Vitamins-Minerals (CENTRUM SILVER ULTRA MENS PO) Take 1 tablet by mouth daily.     Omega-3 Fatty Acids (FISH OIL PO) Take 1 tablet by mouth 2 (two) times daily.  predniSONE (DELTASONE) 10 MG tablet Take 1 tablet (10 mg total) by mouth daily with breakfast. 30 tablet 0   rosuvastatin (CRESTOR) 10 MG tablet Take 1 tablet (10 mg total) by mouth at bedtime. 100 tablet 0   esomeprazole (NEXIUM) 20 MG capsule Take 20 mg by mouth daily at 12 noon.     No facility-administered medications prior to visit.    Review of Systems  Review of Systems  Constitutional: Negative.   HENT: Negative.    Respiratory:  Positive for cough, chest tightness and shortness of breath.     Physical  Exam  BP (!) 150/90 (BP Location: Left Arm, Patient Position: Sitting, Cuff Size: Normal)   Pulse (!) 115   Ht 6' (1.829 m)   Wt 198 lb 9.6 oz (90.1 kg)   SpO2 96%   BMI 26.94 kg/m  Physical Exam Constitutional:      Appearance: Normal appearance.  Cardiovascular:     Rate and Rhythm: Regular rhythm. Tachycardia present.     Comments: Regular rhythm on exam. HR 105-115 Pulmonary:     Comments: ;Lungs are clear on exam. He is short winded while speaking. 02 96% RA.  Skin:    General: Skin is warm and dry.  Neurological:     General: No focal deficit present.     Mental Status: He is alert and oriented to person, place, and time. Mental status is at baseline.  Psychiatric:        Mood and Affect: Mood normal.        Behavior: Behavior normal.        Thought Content: Thought content normal.        Judgment: Judgment normal.     Lab Results:  CBC    Component Value Date/Time   WBC 6.0 09/25/2020 0946   WBC 6.9 06/04/2020 1033   RBC 5.39 09/25/2020 0946   RBC 5.18 06/04/2020 1033   HGB 15.6 09/25/2020 0946   HCT 46.4 09/25/2020 0946   PLT 239 09/25/2020 0946   MCV 86 09/25/2020 0946   MCH 28.9 09/25/2020 0946   MCHC 33.6 09/25/2020 0946   MCHC 33.6 06/04/2020 1033   RDW 14.3 09/25/2020 0946   LYMPHSABS 1.3 09/25/2020 0946   MONOABS 0.8 06/04/2020 1033   EOSABS 0.1 09/25/2020 0946   BASOSABS 0.0 09/25/2020 0946    BMET    Component Value Date/Time   NA 141 09/25/2020 0946   K 4.8 09/25/2020 0946   CL 102 09/25/2020 0946   CO2 20 09/25/2020 0946   GLUCOSE 91 09/25/2020 0946   BUN 13 09/25/2020 0946   CREATININE 1.26 09/25/2020 0946   CALCIUM 10.1 09/25/2020 0946   GFRNONAA 76 03/06/2020 1039   GFRAA 88 03/06/2020 1039    BNP No results found for: BNP  ProBNP    Component Value Date/Time   PROBNP 67 05/11/2020 1130    Imaging: No results found.   Assessment & Plan:   Dyspnea - Patient continues to have moderate-severe shortness of breath at  rest and with exertion. Associated cough and chest tightness. He is really short winded while speaking. Requesting CT imaging and MIP/MEP breathing test from [facility]. I gave him a sample Breo 100 d/t positive bronchodilator response on PFTs and air trapping seen on imaging which could be from underlying asthma.  I have also started him on omeprazole 20mg  BID for GERD symptoms and placed an urgent referral to GI d/t large hiatal hernia which I think is contributing  to his shortness of breath. Plan check BNP to ensure heart failure is not contributing and D-dimer to r/o possibility for PE. FU in 1 month with Dr. Erin Fulling.   OSA (obstructive sleep apnea) - He is not open to trying CPAP, advised he consider oral appliance. We can refer him to either Dr. Ron Parker or Dr. Toy Cookey in the future if he would like   Cough - Checking respiratory sputum culture and AFB  Hiatal hernia - Referring to GI  GERD (gastroesophageal reflux disease) - Start omeprazole 20mg  BID   >35-40 mins spent   Martyn Ehrich, NP 09/28/2020

## 2020-09-28 NOTE — Assessment & Plan Note (Signed)
-   Start omeprazole 20mg  BID

## 2020-09-28 NOTE — Assessment & Plan Note (Addendum)
-   Patient continues to have moderate-severe shortness of breath at rest and with exertion. Associated cough and chest tightness. He is really short winded while speaking. Requesting CT imaging and MIP/MEP breathing test from [facility]. I gave him a sample Breo 100 d/t positive bronchodilator response on PFTs and air trapping seen on imaging which could be from underlying asthma.  I have also started him on omeprazole 20mg  BID for GERD symptoms and placed an urgent referral to GI d/t large hiatal hernia which I think is contributing to his shortness of breath. Plan check BNP to ensure heart failure is not contributing and D-dimer to r/o possibility for PE. FU in 1 month with Dr. Erin Fulling.

## 2020-09-28 NOTE — Patient Instructions (Addendum)
  Recommend Trial inhaler therapy d/t Bronchodilator response seen on Pulmonary function testing and mild air trapping on imaging   We will also start you on omeprazole 20mg  twice a day (stop nexium) for GERD/reflux symptoms which could be contributing to sob/cough  Consider oral appliance for sleep apnea in the further   I will notify you of lab results on Tuesday (I am out of office on Monday)  Rx: Breo 100 and Omeprazole sent to pharmacy   Orders: Respiratory sputum sample Labs today (BNP/d-dimer)   Follow-up: Dr. Erin Fulling July 25, 26, 27th

## 2020-09-28 NOTE — Assessment & Plan Note (Signed)
-   Checking respiratory sputum culture and AFB

## 2020-09-28 NOTE — Assessment & Plan Note (Signed)
-   He is not open to trying CPAP, advised he consider oral appliance. We can refer him to either Dr. Ron Parker or Dr. Toy Cookey in the future if he would like

## 2020-09-28 NOTE — Assessment & Plan Note (Signed)
-   Referring to GI

## 2020-09-29 LAB — BRAIN NATRIURETIC PEPTIDE: Brain Natriuretic Peptide: 30 pg/mL (ref <100)

## 2020-09-29 LAB — D-DIMER, QUANTITATIVE: D-Dimer, Quant: 1.07 mcg/mL FEU — ABNORMAL HIGH (ref <0.50)

## 2020-10-02 ENCOUNTER — Telehealth: Payer: Self-pay | Admitting: Primary Care

## 2020-10-02 ENCOUNTER — Other Ambulatory Visit: Payer: Self-pay

## 2020-10-02 ENCOUNTER — Encounter: Payer: Self-pay | Admitting: Gastroenterology

## 2020-10-02 ENCOUNTER — Ambulatory Visit: Payer: Medicare HMO | Admitting: Gastroenterology

## 2020-10-02 ENCOUNTER — Other Ambulatory Visit: Payer: Medicare HMO

## 2020-10-02 VITALS — BP 140/86 | HR 68 | Ht 72.0 in | Wt 197.0 lb

## 2020-10-02 DIAGNOSIS — K449 Diaphragmatic hernia without obstruction or gangrene: Secondary | ICD-10-CM | POA: Diagnosis not present

## 2020-10-02 DIAGNOSIS — R7989 Other specified abnormal findings of blood chemistry: Secondary | ICD-10-CM

## 2020-10-02 DIAGNOSIS — R12 Heartburn: Secondary | ICD-10-CM

## 2020-10-02 DIAGNOSIS — R059 Cough, unspecified: Secondary | ICD-10-CM | POA: Diagnosis not present

## 2020-10-02 DIAGNOSIS — R0609 Other forms of dyspnea: Secondary | ICD-10-CM

## 2020-10-02 DIAGNOSIS — R0602 Shortness of breath: Secondary | ICD-10-CM

## 2020-10-02 NOTE — Telephone Encounter (Signed)
Called and spoke to pt. Informed him of the recs per Dr. Valeta Harms and information per Dr. Erin Fulling. Pt states he does not want to deal with the sleep issues at this time and wants to stay with Dr. Erin Fulling. Pt states he did the MIP/MEP at [facility] last week and signed a release form to send results to our office. Pt also states he delivered his sputum collection this morning to the lab. Pt has an OV on 11/05/20 with Dr. Erin Fulling, pt is aware of this and wants to keep the appt.    Will forward to Dr. Erin Fulling and Dr. Valeta Harms as Juluis Rainier.

## 2020-10-02 NOTE — Progress Notes (Signed)
Can someone please let this patient know that his D-DIMER was elevated, I want to rule out a PE as cause of his shortness of breath. BNP was normal. He needs stat CTA (please order)

## 2020-10-02 NOTE — Telephone Encounter (Signed)
Called [facility] to get HRCT CD sent to [facility] and CT results faxed to [facility].  [facility] outpatient CT fax # [phone]. A medical release is needed to receive information from [facility]. There is no medical release in chart and I did not find any in Beth's  B pod box. Patient needs to be informed that he will need to contact [facility] outpatient to have HRCT CD sent to [facility]. Patient is currently in OV with [facility].

## 2020-10-02 NOTE — Telephone Encounter (Signed)
Patient has been called twice with results. He has not answered our calls.

## 2020-10-02 NOTE — Progress Notes (Signed)
[facility] Consult Note:  History: Joe Arroyo 10/02/2020  Referring provider: Geraldo Pitter, NP [name])  Reason for consult/chief complaint: Hiatal Hernia   Subjective  HPI:  This is a very pleasant 65 year old man referred by pulmonary clinic after visit there last week for evaluation of hiatal hernia found on imaging in the context of persistent dyspnea.  From June 17 pulmonary clinic note: HPI: 65 year old male, never smoked. PMH significant for HTN, hyperlipidemia, moderate OSA. Patient of Dr. Erin Fulling, last seen on 08/08/20.    09/28/2020- Interim hx  Patient presents today for follow-up. He saw Dr. Erin Fulling in April and was ordered for [facility] testing and MIP/MEP valves, undertunately testing was ordered incorrectly and had to be rescheduled. He has mild air trapping on HRCT and some response to BD on PFTs. Patient saw his PCP on 09/25/20, patient has been having shortness sof breath symptoms since January 2022. His PCP is wanting him to switch providers from Dr. Erin Fulling to Dr. Valeta Harms.     He has had shortness of breath and cough since December 2021. Shortness of breath occurs at all times; with rest and activity. States that this is progressively getting worse. Cough is productive with white-clear mucus. He has some associated chest tightness and reflux. He takes nexium once a day. He had breathing test yesterday and was told that this was normal. Requesting HRCT results and MIP/MEP breathing test testing from [facility]. Sniff test was normal. He was frustrated with lack of response from our office with testing results and length of time in between visits. I explained this was likely d.t testing being down at outside facility [facility]). He will be happy to stay with Dr. Erin Fulling.        TESTING: 08/13/20 DG Sniff test >> No evidence for hemidiaphragmatic paralysis. Normal bilateral hemidiaphragmatic excursion.   07/12/20 PFTs- FVC 3.82 (77%), FEV1 3.24  (88%), ratio 85, TLC 83%, DLCOunc 81 Mildly reduced FVC with positive BD response   Dyspnea - Patient continues to have moderate-severe shortness of breath at rest and with exertion. Associated cough and chest tightness. He is really short winded while speaking. Requesting CT imaging and MIP/MEP breathing test from [facility]. I gave him a sample Breo 100 d/t positive bronchodilator response on PFTs and air trapping seen on imaging which could be from underlying asthma.  I have also started him on omeprazole 20mg  BID for GERD symptoms and placed an urgent referral to GI d/t large hiatal hernia which I think is contributing to his shortness of breath. Plan check BNP to ensure heart failure is not contributing and D-dimer to r/o possibility for PE. FU in 1 month with Dr. Erin Fulling.    OSA (obstructive sleep apnea) - He is not open to trying CPAP, advised he consider oral appliance. We can refer him to either Dr. Ron Parker or Dr. Toy Cookey in the future if he would like    Cough - Checking respiratory sputum culture and AFB   Hiatal hernia - Referring to GI   GERD (gastroesophageal reflux disease) - Start omeprazole 20mg  BID "  _____________________________________________  Joe Arroyo has had persistent dyspnea at rest since last December.  He coughs at some point every day and is usually productive of small amount of white or gray sputum.  He feels short of breath just at rest, worse with exertion.  He is also had years of intermittent heartburn for which he takes OTC Nexium daily with good control.  He does not get  feelings of regurgitation nor does he have dysphagia, nausea or vomiting.  He was prescribed twice daily omeprazole last week, but did not start it because insurance would not cover twice daily. He denies abdominal pain, and says his bowel habits are regular with no rectal bleeding.  His mother had colon cancer, so Joe Arroyo keeps up-to-date with regular colonoscopy, most recently in 2019 by Dr.  Lyda Jester in [place].  His sputum production has decreased somewhat since starting a new inhaler last week. Dyspnea has not improved however ROS:  Review of Systems  Constitutional:  Negative for appetite change and unexpected weight change.  HENT:  Negative for mouth sores and voice change.   Eyes:  Negative for pain and redness.  Respiratory:  Positive for cough and shortness of breath.   Cardiovascular:  Negative for chest pain and palpitations.  Genitourinary:  Negative for dysuria and hematuria.  Musculoskeletal:  Negative for arthralgias and myalgias.  Skin:  Negative for pallor and rash.  Neurological:  Negative for weakness and headaches.  Hematological:  Negative for adenopathy.  He is felt as if his voice is deeper than before this problem started.  Past Medical History: Past Medical History:  Diagnosis Date   Hyperlipidemia 05/14/2018   Hypertension 05/14/2018   Kidney stones    Sinus bradycardia 05/14/2018     Past Surgical History: Past Surgical History:  Procedure Laterality Date   COLONOSCOPY     LAMINECTOMY     MOLE REMOVAL     PROSTATE BIOPSY     TONSILLECTOMY AND ADENOIDECTOMY     UPPER GASTROINTESTINAL ENDOSCOPY       Family History: Family History  Problem Relation Age of Onset   Hyperlipidemia Mother    Aortic stenosis Mother    Valvular heart disease Mother    Colon cancer Mother    Diabetes Mellitus II Mother    Cancer - Colon Mother    Cancer Mother    Stroke Mother    Cerebrovascular Accident Mother    Diabetes Mother    Hypertension Mother    Hyperlipidemia Father    Diabetes Mellitus II Father    Diabetes Father    Esophageal cancer Paternal Uncle    CAD Maternal Grandmother    Congestive Heart Failure Maternal Grandmother    Heart attack Maternal Grandmother    Heart failure Maternal Grandmother    Coronary artery disease Maternal Grandmother    Hyperlipidemia Maternal Grandmother     Social History: Social History    Socioeconomic History   Marital status: Single    Spouse name: Not on file   Number of children: Not on file   Years of education: Not on file   Highest education level: Not on file  Occupational History   Not on file  Tobacco Use   Smoking status: Never   Smokeless tobacco: Never  Vaping Use   Vaping Use: Never used  Substance and Sexual Activity   Alcohol use: Yes    Comment: occ   Drug use: Never   Sexual activity: Not on file  Other Topics Concern   Not on file  Social History Narrative   Not on file   Social Determinants of Health   Financial Resource Strain: Not on file  Food Insecurity: Not on file  Transportation Needs: Not on file  Physical Activity: Not on file  Stress: Not on file  Social Connections: Not on file    Allergies: Allergies  Allergen Reactions   Bystolic [Nebivolol Hcl] Other (  See Comments)    bradycardia   Carvedilol Other (See Comments)    bradycardia   Pravastatin Other (See Comments)    "made my joints hurt"   Hctz [Hydrochlorothiazide] Rash   Hydralazine Rash   Penicillins Rash    Outpatient Meds: Current Outpatient Medications  Medication Sig Dispense Refill   amLODipine (NORVASC) 5 MG tablet Take 1 tablet (5 mg total) by mouth daily. 90 tablet 0   cloNIDine (CATAPRES) 0.3 MG tablet Take 1 tablet (0.3 mg total) by mouth daily. 90 tablet 1   ezetimibe (ZETIA) 10 MG tablet Take 1 tablet (10 mg total) by mouth daily. 90 tablet 1   finasteride (PROSCAR) 5 MG tablet Take 1 tablet by mouth daily.     [START ON 10/05/2020] fluticasone furoate-vilanterol (BREO ELLIPTA) 100-25 MCG/INH AEPB Inhale 1 puff into the lungs daily. 60 each 2   meloxicam (MOBIC) 15 MG tablet Take 1 tablet (15 mg total) by mouth daily as needed. 90 tablet 0   Multiple Vitamins-Minerals (CENTRUM SILVER ULTRA MENS PO) Take 1 tablet by mouth daily.     Omega-3 Fatty Acids (FISH OIL PO) Take 1 tablet by mouth 2 (two) times daily.     omeprazole (PRILOSEC) 20 MG  capsule Take 1 capsule (20 mg total) by mouth 2 (two) times daily before a meal. 60 capsule 2   rosuvastatin (CRESTOR) 10 MG tablet Take 1 tablet (10 mg total) by mouth at bedtime. 100 tablet 0   predniSONE (DELTASONE) 10 MG tablet Take 1 tablet (10 mg total) by mouth daily with breakfast. (Patient not taking: Reported on 10/02/2020) 30 tablet 0   No current facility-administered medications for this visit.      ___________________________________________________________________ Objective   Exam:  BP 140/86   Pulse 68   Ht 6' (1.829 m)   Wt 197 lb (89.4 kg)   SpO2 94%   BMI 26.72 kg/m  Wt Readings from Last 3 Encounters:  10/02/20 197 lb (89.4 kg)  09/28/20 198 lb 9.6 oz (90.1 kg)  09/25/20 197 lb (89.4 kg)    General: He is pleasant and conversational.  Dyspneic and tachypneic at rest, though not in respiratory distress and not using accessory muscles Not dysarthric or dysphonic. He says that he feels as if he can just not get air in or out properly. Eyes: sclera anicteric, no redness ENT: oral mucosa moist without lesions, no cervical or supraclavicular lymphadenopathy CV: RRR without murmur, S1/S2, no JVD, no peripheral edema Resp: clear to auscultation bilaterally, increased RR and normal effort noted (breathing slows down briefly during exam).  No upper airway sounds on auscultation of neck GI: soft, no tenderness, with active bowel sounds. No guarding or palpable organomegaly noted. Skin; warm and dry, no rash or jaundice noted Neuro: awake, alert and oriented x 3. Normal gross motor function and fluent speech  Labs:  CBC Latest Ref Rng & Units 09/25/2020 06/19/2020 06/04/2020  WBC 3.4 - 10.8 x10E3/uL 6.0 8.1 6.9  Hemoglobin 13.0 - 17.7 g/dL 15.6 16.3 15.3  Hematocrit 37.5 - 51.0 % 46.4 48.0 45.5  Platelets 150 - 450 x10E3/uL 239 267 215.0   CMP Latest Ref Rng & Units 09/25/2020 06/19/2020 03/06/2020  Glucose 65 - 99 mg/dL 91 75 79  BUN 8 - 27 mg/dL 13 16 13   Creatinine  0.76 - 1.27 mg/dL 1.26 1.27 1.02  Sodium 134 - 144 mmol/L 141 142 140  Potassium 3.5 - 5.2 mmol/L 4.8 4.1 4.7  Chloride 96 - 106 mmol/L  102 101 101  CO2 20 - 29 mmol/L 20 21 24   Calcium 8.6 - 10.2 mg/dL 10.1 10.2 10.1  Total Protein 6.0 - 8.5 g/dL 7.3 7.4 7.1  Total Bilirubin 0.0 - 1.2 mg/dL 0.5 0.6 0.5  Alkaline Phos 44 - 121 IU/L 134(H) 113 146(H)  AST 0 - 40 IU/L 38 46(H) 27  ALT 0 - 44 IU/L 41 53(H) 27   D-dimer elevated at 1.07 (normal less than 0.5) -I messaged this patient's most recent pulmonary provider, and she indicated plans for a thoracic CTA to rule out PE soon.  Radiologic Studies:  CLINICAL DATA:  Shortness of breath.   EXAM: CHEST FLUOROSCOPY   TECHNIQUE: Real-time fluoroscopic evaluation of the chest was performed.   FLUOROSCOPY TIME:  Fluoroscopy Time:  0 minutes and 42 seconds.   Radiation Exposure Index (if provided by the fluoroscopic device): 5.1 mGy   Number of Acquired Spot Images:   COMPARISON:  Chest x-ray 05/01/2020   FINDINGS: Mild asymmetric elevation of the right hemidiaphragm noted, similar to prior chest x-ray.   Excursion of the hemidiaphragms was observed fluoroscopically during "sniff test maneuver". Symmetric expected downward movement of both hemidiaphragms was observed within elation. Patient is noted to have a hiatal hernia.   IMPRESSION: No evidence for hemidiaphragmatic paralysis. Normal bilateral hemidiaphragmatic excursion.     Electronically Signed   By: Misty Stanley M.D.   On: 08/13/2020 10:59 _________________________  No hiatal hernia mentioned on May 2021 outside imaging chest x-ray or 2019 abdominal CTA  ______________________________________________  Report from 06/22/2020 high-res CT scan chest pulled from PACS, imaging done at [facility]:  CLINICAL DATA: Shortness of breath, cough.  EXAM: CT CHEST WITHOUT CONTRAST  TECHNIQUE: Multidetector CT imaging of the chest was performed following  the standard protocol without intravenous contrast. High resolution imaging of the lungs, as well as inspiratory and expiratory imaging, was performed.  COMPARISON: None.  FINDINGS: Cardiovascular: Ascending aorta measures 4.7 cm. Atherosclerotic calcification of the aortic valve and coronary arteries. Heart is enlarged. No pericardial effusion.  Mediastinum/Nodes: No pathologically enlarged mediastinal or axillary lymph nodes. Hilar regions are difficult to evaluate without IV contrast. Esophagus is grossly unremarkable. Large hiatal hernia.  Lungs/Pleura: Negative for subpleural reticulation, traction bronchiectasis/bronchiolectasis, ground-glass, architectural distortion or honeycombing. Subsegmental volume loss in both lower lobes. No pleural fluid. Airway is unremarkable. Mild air trapping.  Upper Abdomen: Visualized portions of the liver, gallbladder, right adrenal glands spleen are unremarkable. Large hiatal hernia.  Musculoskeletal: Degenerative changes in the spine. Bone island in the proximal left humerus.  IMPRESSION: 1. No evidence of interstitial lung disease. Mild air trapping is indicative of small airways disease. 2. Ascending aortic aneurysm. Ascending thoracic aortic aneurysm. Recommend semi-annual imaging followup by CTA or MRA and referral to cardiothoracic surgery if not already obtained. This recommendation follows 2010 ACCF/AHA/AATS/ACR/ASA/SCA/SCAI/SIR/STS/SVM Guidelines for the Diagnosis and Management of Patients With Thoracic Aortic Disease. Circulation. 2010; 121: M546-T035. Aortic aneurysm NOS (ICD10-I71.9). 3. Large hiatal hernia. 4. Aortic atherosclerosis (ICD10-I70.0). Coronary artery calcification.   Electronically Signed By: Lorin Picket M.D. On: 06/22/2020 14:26  (HD - I personally reviewed these CT images - about half the stomach is intrathoracic.  Incomplete view of upper abdomen)  Assessment: Encounter Diagnoses  Name Primary?    Hiatal hernia Yes   Other form of dyspnea    Heartburn     While he has years of probable reflux with heartburn, it is well controlled on once daily OTC PPI he denies dysphagia, nausea or  vomiting or chest pain.  I described a hiatal hernia to him and showed him diagrams of the anatomy. While it is a large hiatal hernia and most likely contributing to heartburn, I do not believe it is causing his dyspnea.  His upper digestive symptoms do not suggest severe regurgitation and aspiration that may be causing upper airway problems and dyspnea. While this hernia may require surgical repair at some point to help control reflux symptoms, I would not advocate that at this point since his dyspnea is not fully understood. His symptoms and appearance on exam today make me wonder if this dyspnea is primarily an upper airway/laryngeal problem rather than lower respiratory. Perhaps he would benefit from ENT visualization of vocal cords or bronchoscopy?  Plan: Continue Nexium 20 mg once daily because it is sufficiently controlling his heartburn. Return to pulmonary clinic for further testing as planned. I would like to see him back after further testing and improvement in his dyspnea.  Thank you for the courtesy of this consult.  Please call me with any questions or concerns.  Nelida Meuse III  CC: Referring provider noted above

## 2020-10-02 NOTE — Telephone Encounter (Signed)
Called and spoke with patient. I made him aware of the CT scan disk that we have requested. He stated that he did sign a release on Friday at his visit. I advised him that we would continue to look for the release.   While on the phone, he advised him of the d-dimer results. He verbalized understanding. Patient insists on having the CTA done at [facility] since it is about 10 minutes away from his house versus coming to [place]. He stated that when he has the 2nd CT done, he will sign a release at [place] for them to send both disks and results to [place].   He is aware that I will place the order today.   Nothing further needed at time of call.

## 2020-10-02 NOTE — Telephone Encounter (Signed)
He filled one out when he was here in office last Friday. Also I send results note you triage, he needs CTA stat re: elevated d-dimer. Please notify patient of result

## 2020-10-02 NOTE — Patient Instructions (Signed)
If you are age 67 or older, your body mass index should be between 23-30. Your Body mass index is 26.72 kg/m. If this is out of the aforementioned range listed, please consider follow up with your Primary Care Provider.  If you are age 21 or younger, your body mass index should be between 19-25. Your Body mass index is 26.72 kg/m. If this is out of the aformentioned range listed, please consider follow up with your Primary Care Provider.   __________________________________________________________  The [place] [facility] providers would like to encourage you to use [facility] to communicate with providers for non-urgent requests or questions.  Due to long hold times on the telephone, sending your provider a message by [facility] may be a faster and more efficient way to get a response.  Please allow 48 business hours for a response.  Please remember that this is for non-urgent requests.   Follow up as needed.  It was a pleasure to see you today!  Thank you for trusting me with your gastrointestinal care!

## 2020-10-02 NOTE — Telephone Encounter (Signed)
I need HRCT imaging results from Feb I believe at [facility] asap, she has an aptwith GI today. I believe I had requested these back on Friday. Can someone get these?

## 2020-10-03 ENCOUNTER — Telehealth: Payer: Self-pay | Admitting: Pulmonary Disease

## 2020-10-03 ENCOUNTER — Telehealth: Payer: Self-pay | Admitting: Primary Care

## 2020-10-03 ENCOUNTER — Encounter: Payer: Self-pay | Admitting: Primary Care

## 2020-10-03 DIAGNOSIS — R7989 Other specified abnormal findings of blood chemistry: Secondary | ICD-10-CM | POA: Diagnosis not present

## 2020-10-03 DIAGNOSIS — R0602 Shortness of breath: Secondary | ICD-10-CM | POA: Diagnosis not present

## 2020-10-03 DIAGNOSIS — R079 Chest pain, unspecified: Secondary | ICD-10-CM | POA: Diagnosis not present

## 2020-10-03 NOTE — Telephone Encounter (Addendum)
I have updated the location for the CTA to [facility].  I faxed the updated order to Abigail Butts at [phone] & rec'd a fax confirm.

## 2020-10-03 NOTE — Telephone Encounter (Signed)
Spoke with pt and reviewed results as dictated by Geraldo Pitter. Pt stated understanding and confirmed f/u OV with Dr. Erin Fulling on 11/05/20. Nothing further needed at this time.

## 2020-10-03 NOTE — Telephone Encounter (Signed)
I gave prior auth info and faxed print out of auth this morning when I scheduled CTA.  Called Abigail Butts & she states she called [employer] & gave them auth # & they said it wasn't one of theirs.  She asked that I fax printout to her at [phone].  Scheduling didn't give her the copy I faxed earlier.  I faxed to [facility] & confirmation received.  I called Abigail Butts back and she got the fax.  Gave her my direct # and told her if there is a problem to call me back.  Nothing further needed at this time.

## 2020-10-03 NOTE — Telephone Encounter (Signed)
Joe Arroyo from [facility] is in need to speak with someone in regards to a prior authorization for a CT scan for outpatient hospital. Pt is supposed to be seen today. Pls regard; [phone]

## 2020-10-03 NOTE — Telephone Encounter (Signed)
Please let patient know CTA 10/03/20 showed no evidence of pulmonary embolism. Minimal scarring or atelectasis bilateral lung bases. Moderate hiatal hernia. Cardiomegaly. Enlargement of ascending thoracic aorta, needs semi-annual follow-up. PCP can order repeat CTA or MRA to monitor   What did GI have to say? He may want to follow-up with cardiology as well

## 2020-10-04 NOTE — Progress Notes (Signed)
Sputum culture grew out normal oropharyngeal flora

## 2020-10-05 LAB — RESPIRATORY CULTURE OR RESPIRATORY AND SPUTUM CULTURE
MICRO NUMBER:: 12031852
RESULT:: NORMAL
SPECIMEN QUALITY:: ADEQUATE

## 2020-10-07 NOTE — Progress Notes (Deleted)
Cardiology Office Note:    Date:  10/07/2020   ID:  Tyrell Seifer, DOB [birth year], MRN [id number]  PCP:  [name], MD  Cardiologist:  Shirlee More, MD    Referring MD: [name], MD    ASSESSMENT:    No diagnosis found. PLAN:    In order of problems listed above:  ***   Next appointment: ***   Medication Adjustments/Labs and Tests Ordered: Current medicines are reviewed at length with the patient today.  Concerns regarding medicines are outlined above.  No orders of the defined types were placed in this encounter.  No orders of the defined types were placed in this encounter.   No chief complaint on file.   History of Present Illness:    Cleatus Gabriel is a 65 y.o. male with a hx of resistant hypertension hyperlipidemia moderate obstructive sleep apnea and chronic shortness of breath last seen 05/11/2020.  He is actively seeing multiple specialist including pulmonary and gastro and neurology. Compliance with diet, lifestyle and medications: ***  His proBNP level was quite low at 67. Component Ref Range & Units 4 mo ago   NT-Pro BNP 0 - 376 pg/mL 67    His echocardiogram showed a normal ejection fraction 60 to 65% mild LVH and impaired relaxation with normal diastolic filling pressure.  He also had mild aortic regurgitation no stenosis moderate enlargement of the ascending aorta 45 mm.  Sniff test showed no evidence of diaphragmatic paralysis he is noted to have hiatal hernia. He has been seen by pulmonary CTA showed no findings of pulmonary embolism he had a moderate hiatal hernia and minimal scarring or atelectasis at the bases.  Unfortunately I cannot see the report within epic.  He has been seen by pulmonary and treated with bronchodilator and PPI.  This report has been cut-and-paste from his CT scan performed at [facility]. Report from 06/22/2020 high-res CT scan chest pulled from PACS, imaging done at [facility]:   CLINICAL DATA: Shortness of  breath, cough.   EXAM: CT CHEST WITHOUT CONTRAST   TECHNIQUE: Multidetector CT imaging of the chest was performed following the standard protocol without intravenous contrast. High resolution imaging of the lungs, as well as inspiratory and expiratory imaging, was performed.   COMPARISON: None.   FINDINGS: Cardiovascular: Ascending aorta measures 4.7 cm. Atherosclerotic calcification of the aortic valve and coronary arteries. Heart is enlarged. No pericardial effusion.   Mediastinum/Nodes: No pathologically enlarged mediastinal or axillary lymph nodes. Hilar regions are difficult to evaluate without IV contrast. Esophagus is grossly unremarkable. Large hiatal hernia.   Lungs/Pleura: Negative for subpleural reticulation, traction bronchiectasis/bronchiolectasis, ground-glass, architectural distortion or honeycombing. Subsegmental volume loss in both lower lobes. No pleural fluid. Airway is unremarkable. Mild air trapping.   Upper Abdomen: Visualized portions of the liver, gallbladder, right adrenal glands spleen are unremarkable. Large hiatal hernia.   Musculoskeletal: Degenerative changes in the spine. Bone island in the proximal left humerus.   IMPRESSION: 1. No evidence of interstitial lung disease. Mild air trapping is indicative of small airways disease. 2. Ascending aortic aneurysm. Ascending thoracic aortic aneurysm. Recommend semi-annual imaging followup by CTA or MRA and referral to cardiothoracic surgery if not already obtained. This recommendation follows 2010 ACCF/AHA/AATS/ACR/ASA/SCA/SCAI/SIR/STS/SVM Guidelines for the Diagnosis and Management of Patients With Thoracic Aortic Disease. Circulation. 2010; 121: E268-T419. Aortic aneurysm NOS (ICD10-I71.9). 3. Large hiatal hernia. 4. Aortic atherosclerosis (ICD10-I70.0). Coronary artery calcification.   Past Medical History:  Diagnosis Date   Hyperlipidemia 05/14/2018   Hypertension 05/14/2018  Kidney stones     Sinus bradycardia 05/14/2018    Past Surgical History:  Procedure Laterality Date   COLONOSCOPY     LAMINECTOMY     MOLE REMOVAL     PROSTATE BIOPSY     TONSILLECTOMY AND ADENOIDECTOMY     UPPER GASTROINTESTINAL ENDOSCOPY      Current Medications: No outpatient medications have been marked as taking for the 10/08/20 encounter (Appointment) with Richardo Priest, MD.     Allergies:   Bystolic [nebivolol hcl], Carvedilol, Pravastatin, Hctz [hydrochlorothiazide], Hydralazine, and Penicillins   Social History   Socioeconomic History   Marital status: Single    Spouse name: Not on file   Number of children: Not on file   Years of education: Not on file   Highest education level: Not on file  Occupational History   Not on file  Tobacco Use   Smoking status: Never   Smokeless tobacco: Never  Vaping Use   Vaping Use: Never used  Substance and Sexual Activity   Alcohol use: Yes    Comment: occ   Drug use: Never   Sexual activity: Not on file  Other Topics Concern   Not on file  Social History Narrative   Not on file   Social Determinants of Health   Financial Resource Strain: Not on file  Food Insecurity: Not on file  Transportation Needs: Not on file  Physical Activity: Not on file  Stress: Not on file  Social Connections: Not on file     Family History: The patient's ***family history includes Aortic stenosis in his mother; CAD in his maternal grandmother; Cancer in his mother; Cancer - Colon in his mother; Cerebrovascular Accident in his mother; Colon cancer in his mother; Congestive Heart Failure in his maternal grandmother; Coronary artery disease in his maternal grandmother; Diabetes in his father and mother; Diabetes Mellitus II in his father and mother; Esophageal cancer in his paternal uncle; Heart attack in his maternal grandmother; Heart failure in his maternal grandmother; Hyperlipidemia in his father, maternal grandmother, and mother; Hypertension in his  mother; Stroke in his mother; Valvular heart disease in his mother. ROS:   Please see the history of present illness.    All other systems reviewed and are negative.  EKGs/Labs/Other Studies Reviewed:    The following studies were reviewed today:  EKG:  EKG ordered today and personally reviewed.  The ekg ordered today demonstrates ***  Recent Labs: 05/11/2020: NT-Pro BNP 67 09/25/2020: ALT 41; BUN 13; Creatinine, Ser 1.26; Hemoglobin 15.6; Platelets 239; Potassium 4.8; Sodium 141; TSH 3.150 09/28/2020: Brain Natriuretic Peptide 30  Recent Lipid Panel    Component Value Date/Time   CHOL 145 09/25/2020 0946   TRIG 126 09/25/2020 0946   HDL 52 09/25/2020 0946   CHOLHDL 2.8 09/25/2020 0946   LDLCALC 71 09/25/2020 0946    Physical Exam:    VS:  There were no vitals taken for this visit.    Wt Readings from Last 3 Encounters:  10/02/20 197 lb (89.4 kg)  09/28/20 198 lb 9.6 oz (90.1 kg)  09/25/20 197 lb (89.4 kg)     GEN: *** Well nourished, well developed in no acute distress HEENT: Normal NECK: No JVD; No carotid bruits LYMPHATICS: No lymphadenopathy CARDIAC: ***RRR, no murmurs, rubs, gallops RESPIRATORY:  Clear to auscultation without rales, wheezing or rhonchi  ABDOMEN: Soft, non-tender, non-distended MUSCULOSKELETAL:  No edema; No deformity  SKIN: Warm and dry NEUROLOGIC:  Alert and oriented x 3 PSYCHIATRIC:  Normal affect    Signed, Shirlee More, MD  10/07/2020 11:24 AM     [facility]

## 2020-10-08 ENCOUNTER — Encounter: Payer: Self-pay | Admitting: Cardiology

## 2020-10-08 ENCOUNTER — Ambulatory Visit: Payer: Medicare HMO | Admitting: Cardiology

## 2020-10-08 ENCOUNTER — Other Ambulatory Visit: Payer: Self-pay

## 2020-10-08 VITALS — BP 138/90 | HR 86 | Ht 72.0 in | Wt 197.6 lb

## 2020-10-08 DIAGNOSIS — I7121 Aneurysm of the ascending aorta, without rupture: Secondary | ICD-10-CM

## 2020-10-08 DIAGNOSIS — R0602 Shortness of breath: Secondary | ICD-10-CM

## 2020-10-08 DIAGNOSIS — E782 Mixed hyperlipidemia: Secondary | ICD-10-CM | POA: Diagnosis not present

## 2020-10-08 DIAGNOSIS — I10 Essential (primary) hypertension: Secondary | ICD-10-CM | POA: Diagnosis not present

## 2020-10-08 DIAGNOSIS — I712 Thoracic aortic aneurysm, without rupture: Secondary | ICD-10-CM

## 2020-10-08 NOTE — Patient Instructions (Signed)
Medication Instructions:  Your physician recommends that you continue on your current medications as directed. Please refer to the Current Medication list given to you today.  *If you need a refill on your cardiac medications before your next appointment, please call your pharmacy*   Lab Work: None If you have labs (blood work) drawn today and your tests are completely normal, you will receive your results only by: [facility] (if you have MyChart) OR A paper copy in the mail If you have any lab test that is abnormal or we need to change your treatment, we will call you to review the results.   Testing/Procedures: We have put in the order for you to have a CTA of your chest completed in 6 months.    Follow-Up: At [facility], you and your health needs are our priority.  As part of our continuing mission to provide you with exceptional heart care, we have created designated Provider Care Teams.  These Care Teams include your primary Cardiologist (physician) and Advanced Practice Providers (APPs -  Physician Assistants and Nurse Practitioners) who all work together to provide you with the care you need, when you need it.  We recommend signing up for the patient portal called "MyChart".  Sign up information is provided on this After Visit Summary.  MyChart is used to connect with patients for Virtual Visits (Telemedicine).  Patients are able to view lab/test results, encounter notes, upcoming appointments, etc.  Non-urgent messages can be sent to your provider as well.   To learn more about what you can do with MyChart, go to [email or web address].    Your next appointment:   6 month(s)  The format for your next appointment:   In Person  Provider:   Shirlee More, MD   Other Instructions

## 2020-10-08 NOTE — Progress Notes (Signed)
Cardiology Office Note:    Date:  10/08/2020   ID:  Sanuel Arroyo, DOB 03/03/1956, MRN [id number]  PCP:  [name], MD  Cardiologist:  Shirlee More, MD    Referring MD: [name], MD    ASSESSMENT:    1. Shortness of breath   2. Resistant hypertension   3. Mixed hyperlipidemia   4. Ascending aortic aneurysm (HCC)    PLAN:    In order of problems listed above: He continues to be very short of breath out of proportion to findings by pulmonary hiatal hernia and untreated sleep apnea. He has no findings of congestive heart failure As recommended by GI I will refer him to ENT for evaluation of his upper airway. Hypertension stable blood pressure at target continue current treatment he has mild pedal edema related to his calcium channel blocker Continue lipid-lowering statin Zetia Follow-up CTA in 6 months and I will see him in the office afterwards in follow-up for his hypertension and thoracic aortic disease      Next appointment: 6 months   Medication Adjustments/Labs and Tests Ordered: Current medicines are reviewed at length with the patient today.  Concerns regarding medicines are outlined above.  No orders of the defined types were placed in this encounter.  No orders of the defined types were placed in this encounter.   Chief Complaint  Patient presents with   Follow-up   Shortness of Breath     History of Present Illness:    Joe Arroyo is a 65 y.o. male with a hx of resistant hypertension hyperlipidemia moderate obstructive sleep apnea and chronic shortness of breath last seen 05/11/2020.  He is actively seeing multiple specialist including pulmonary and gastro and neurology.  Compliance with diet, lifestyle and medications: Yes He has not been treated for sleep apnea He is on improved with bronchodilators He continues to be short of breath at rest with any activities in an variable pattern. He has pedal edema and takes a calcium channel blocker  for hypertension. I reviewed his testing he needs a follow-up CT of his ascending aorta in 6 months and see me afterwards he has thoracic aortic aneurysm.   His proBNP level was quite low at 67. Component Ref Range & Units 4 mo ago  NT-Pro BNP 0 - 376 pg/mL 67     His echocardiogram showed a normal ejection fraction 60 to 65% mild LVH and impaired relaxation with normal diastolic filling pressure.  He also had mild aortic regurgitation no stenosis moderate enlargement of the ascending aorta 45 mm.   Sniff test showed no evidence of diaphragmatic paralysis he is noted to have hiatal hernia. He has been seen by pulmonary CTA showed no findings of pulmonary embolism he had a moderate hiatal hernia and minimal scarring or atelectasis at the bases.  Unfortunately I cannot see the report within epic.  He has been seen by pulmonary and treated with bronchodilator and PPI.   This report has been cut-and-paste from his CT scan performed at [facility]. Report from 06/22/2020 high-res CT scan chest pulled from PACS, imaging done at [facility]:   CLINICAL DATA: Shortness of breath, cough.   EXAM: CT CHEST WITHOUT CONTRAST   TECHNIQUE: Multidetector CT imaging of the chest was performed following the standard protocol without intravenous contrast. High resolution imaging of the lungs, as well as inspiratory and expiratory imaging, was performed.   COMPARISON: None.   FINDINGS: Cardiovascular: Ascending aorta measures 4.7 cm. Atherosclerotic calcification of the aortic  valve and coronary arteries. Heart is enlarged. No pericardial effusion.   Mediastinum/Nodes: No pathologically enlarged mediastinal or axillary lymph nodes. Hilar regions are difficult to evaluate without IV contrast. Esophagus is grossly unremarkable. Large hiatal hernia.   Lungs/Pleura: Negative for subpleural reticulation, traction bronchiectasis/bronchiolectasis, ground-glass, architectural distortion or  honeycombing. Subsegmental volume loss in both lower lobes. No pleural fluid. Airway is unremarkable. Mild air trapping.   Upper Abdomen: Visualized portions of the liver, gallbladder, right adrenal glands spleen are unremarkable. Large hiatal hernia.   Musculoskeletal: Degenerative changes in the spine. Bone island in the proximal left humerus.   IMPRESSION: 1. No evidence of interstitial lung disease. Mild air trapping is indicative of small airways disease. 2. Ascending aortic aneurysm. Ascending thoracic aortic aneurysm. Recommend semi-annual imaging followup by CTA or MRA and referral to cardiothoracic surgery if not already obtained. This recommendation follows 2010 ACCF/AHA/AATS/ACR/ASA/SCA/SCAI/SIR/STS/SVM Guidelines for the Diagnosis and Management of Patients With Thoracic Aortic Disease. Circulation. 2010; 121: O709-G283. Aortic aneurysm NOS (ICD10-I71.9). 3. Large hiatal hernia. 4. Aortic atherosclerosis (ICD10-I70.0). Coronary artery calcification.   Past Medical History:  Diagnosis Date   Hyperlipidemia 05/14/2018   Hypertension 05/14/2018   Kidney stones    Sinus bradycardia 05/14/2018    Past Surgical History:  Procedure Laterality Date   COLONOSCOPY     LAMINECTOMY     MOLE REMOVAL     PROSTATE BIOPSY     TONSILLECTOMY AND ADENOIDECTOMY     UPPER GASTROINTESTINAL ENDOSCOPY      Current Medications: Current Meds  Medication Sig   amLODipine (NORVASC) 5 MG tablet Take 1 tablet (5 mg total) by mouth daily.   cloNIDine (CATAPRES) 0.3 MG tablet Take 1 tablet (0.3 mg total) by mouth daily.   ezetimibe (ZETIA) 10 MG tablet Take 1 tablet (10 mg total) by mouth daily.   finasteride (PROSCAR) 5 MG tablet Take 1 tablet by mouth daily.   meloxicam (MOBIC) 15 MG tablet Take 1 tablet (15 mg total) by mouth daily as needed. (Patient taking differently: Take 15 mg by mouth daily as needed for pain.)   Multiple Vitamins-Minerals (CENTRUM SILVER ULTRA MENS PO) Take 1  tablet by mouth daily.   Omega-3 Fatty Acids (FISH OIL PO) Take 1 tablet by mouth 2 (two) times daily.   rosuvastatin (CRESTOR) 10 MG tablet Take 1 tablet (10 mg total) by mouth at bedtime.     Allergies:   Bystolic [nebivolol hcl], Carvedilol, Pravastatin, Hctz [hydrochlorothiazide], Hydralazine, and Penicillins   Social History   Socioeconomic History   Marital status: Single    Spouse name: Not on file   Number of children: Not on file   Years of education: Not on file   Highest education level: Not on file  Occupational History   Not on file  Tobacco Use   Smoking status: Never   Smokeless tobacco: Never  Vaping Use   Vaping Use: Never used  Substance and Sexual Activity   Alcohol use: Yes    Comment: occ   Drug use: Never   Sexual activity: Not on file  Other Topics Concern   Not on file  Social History Narrative   Not on file   Social Determinants of Health   Financial Resource Strain: Not on file  Food Insecurity: Not on file  Transportation Needs: Not on file  Physical Activity: Not on file  Stress: Not on file  Social Connections: Not on file     Family History: The patient's family history includes Aortic stenosis in  his mother; CAD in his maternal grandmother; Cancer in his mother; Cancer - Colon in his mother; Cerebrovascular Accident in his mother; Colon cancer in his mother; Congestive Heart Failure in his maternal grandmother; Coronary artery disease in his maternal grandmother; Diabetes in his father and mother; Diabetes Mellitus II in his father and mother; Esophageal cancer in his paternal uncle; Heart attack in his maternal grandmother; Heart failure in his maternal grandmother; Hyperlipidemia in his father, maternal grandmother, and mother; Hypertension in his mother; Stroke in his mother; Valvular heart disease in his mother. ROS:   Please see the history of present illness.    All other systems reviewed and are negative.  EKGs/Labs/Other Studies  Reviewed:    The following studies were reviewed today:   Recent Labs: 05/11/2020: NT-Pro BNP 67 09/25/2020: ALT 41; BUN 13; Creatinine, Ser 1.26; Hemoglobin 15.6; Platelets 239; Potassium 4.8; Sodium 141; TSH 3.150 09/28/2020: Brain Natriuretic Peptide 30  Recent Lipid Panel    Component Value Date/Time   CHOL 145 09/25/2020 0946   TRIG 126 09/25/2020 0946   HDL 52 09/25/2020 0946   CHOLHDL 2.8 09/25/2020 0946   LDLCALC 71 09/25/2020 0946    Physical Exam:    VS:  BP 138/90 (BP Location: Left Arm, Patient Position: Sitting, Cuff Size: Normal)   Pulse 86   Ht 6' (1.829 m)   Wt 197 lb 10.1 oz (89.6 kg)   SpO2 96%   BMI 26.80 kg/m     Wt Readings from Last 3 Encounters:  10/08/20 197 lb 10.1 oz (89.6 kg)  10/02/20 197 lb (89.4 kg)  09/28/20 198 lb 9.6 oz (90.1 kg)     GEN:  Well nourished, well developed in no acute distress HEENT: Normal NECK: No JVD; No carotid bruits LYMPHATICS: No lymphadenopathy CARDIAC: RRR, no murmurs, rubs, gallops RESPIRATORY:  Clear to auscultation without rales, wheezing or rhonchi  ABDOMEN: Soft, non-tender, non-distended MUSCULOSKELETAL:  No edema; No deformity  SKIN: Warm and dry NEUROLOGIC:  Alert and oriented x 3 PSYCHIATRIC:  Normal affect    Signed, Shirlee More, MD  10/08/2020 1:20 PM    [facility]

## 2020-10-10 ENCOUNTER — Encounter: Payer: Self-pay | Admitting: *Deleted

## 2020-10-30 ENCOUNTER — Other Ambulatory Visit: Payer: Self-pay

## 2020-10-30 DIAGNOSIS — I10 Essential (primary) hypertension: Secondary | ICD-10-CM

## 2020-10-30 MED ORDER — AMLODIPINE BESYLATE 5 MG PO TABS: 5.0000 mg | ORAL_TABLET | Freq: Every day | ORAL | 0 refills | Status: DC

## 2020-11-05 ENCOUNTER — Encounter: Payer: Self-pay | Admitting: Primary Care

## 2020-11-05 ENCOUNTER — Other Ambulatory Visit: Payer: Self-pay

## 2020-11-05 ENCOUNTER — Ambulatory Visit: Payer: Medicare HMO | Admitting: Pulmonary Disease

## 2020-11-05 ENCOUNTER — Encounter: Payer: Self-pay | Admitting: Pulmonary Disease

## 2020-11-05 VITALS — BP 152/84 | HR 88 | Temp 98.9°F | Resp 18 | Ht 72.0 in | Wt 198.6 lb

## 2020-11-05 DIAGNOSIS — K449 Diaphragmatic hernia without obstruction or gangrene: Secondary | ICD-10-CM | POA: Diagnosis not present

## 2020-11-05 DIAGNOSIS — K219 Gastro-esophageal reflux disease without esophagitis: Secondary | ICD-10-CM

## 2020-11-05 DIAGNOSIS — R0602 Shortness of breath: Secondary | ICD-10-CM

## 2020-11-05 MED ORDER — ADVAIR HFA 230-21 MCG/ACT IN AERO: 2.0000 | INHALATION_SPRAY | Freq: Two times a day (BID) | RESPIRATORY_TRACT | 12 refills | Status: DC

## 2020-11-05 NOTE — Progress Notes (Signed)
Synopsis: Referred in February 2022 by [name], MD for dyspnea on exertion.  Subjective:   PATIENT ID: Joe Arroyo GENDER: male DOB: [birth year], MRN: [id number]  HPI  Chief Complaint  Patient presents with   Follow-up    SOB   Kelsie Kramp is a 65 year old male, never smoker with hypertension, hyperlipidemia and moderate obstructive sleep apnea who returns to pulmonary clinic for evaluation of dyspnea.   He continues to experience shortness of breath at rest and with exertion. He reports the dyspnea continues to progress and he is now having central chest pressure. He was last seen in our office by Derl Barrow on 09/28/20 where a d-dimer was elevated and a CTA chest was performed with no finding of PE. He was then referred to GI for evaluation of the large hiatal hernia, and GI note reviewed from 10/02/20 where it is felt that the hiatal hernia is less likely the etiology of his dyspnea and was concerned for an upper airway etiology and recommended ENT referral.  BNP is not elevated. ANA, RF, ESR, CCP and CRP are not elevated. CBC and CMP remain within normal limits. TSH is normal. Sniff test shows normal diaphragmatic function, no paralysis present. PFTs are within normal limits although he has an isolated decrease in his FVC that is mild. MIP/MEP values have not been obtained yet due to scheduling/ordering issues and when he was scheduled at [facility], simple spirometry was performed on 09/27/20 which was normal.   He was diagnosed with moderate obstructive sleep apnea with an AHI of 15.6/hr from a home sleep study on 03/01/20. He has not wanted to try CPAP therapy.  He tried [facility] after last visit and denies any improvement after using it for a week. He was given a steroid taper without much improvement after the initial encounter in 05/2020.   He was seen by his cardiologist 10/08/20 who referred him to the ENT for further evaluation which is scheduled on 11/13/20. No  cardiology etiology is felt to be contributing to his dyspnea.  Past Medical History:  Diagnosis Date   Hyperlipidemia 05/14/2018   Hypertension 05/14/2018   Kidney stones    Sinus bradycardia 05/14/2018     Family History  Problem Relation Age of Onset   Hyperlipidemia Mother    Aortic stenosis Mother    Valvular heart disease Mother    Colon cancer Mother    Diabetes Mellitus II Mother    Cancer - Colon Mother    Cancer Mother    Stroke Mother    Cerebrovascular Accident Mother    Diabetes Mother    Hypertension Mother    Hyperlipidemia Father    Diabetes Mellitus II Father    Diabetes Father    Esophageal cancer Paternal Uncle    CAD Maternal Grandmother    Congestive Heart Failure Maternal Grandmother    Heart attack Maternal Grandmother    Heart failure Maternal Grandmother    Coronary artery disease Maternal Grandmother    Hyperlipidemia Maternal Grandmother      Social History   Socioeconomic History   Marital status: Single    Spouse name: Not on file   Number of children: Not on file   Years of education: Not on file   Highest education level: Not on file  Occupational History   Not on file  Tobacco Use   Smoking status: Never   Smokeless tobacco: Never  Vaping Use   Vaping Use: Never used  Substance and  Sexual Activity   Alcohol use: Yes    Comment: occ   Drug use: Never   Sexual activity: Not on file  Other Topics Concern   Not on file  Social History Narrative   Not on file   Social Determinants of Health   Financial Resource Strain: Not on file  Food Insecurity: Not on file  Transportation Needs: Not on file  Physical Activity: Not on file  Stress: Not on file  Social Connections: Not on file  Intimate Partner Violence: Not on file     Allergies  Allergen Reactions   Bystolic [Nebivolol Hcl] Other (See Comments)    bradycardia   Carvedilol Other (See Comments)    bradycardia   Pravastatin Other (See Comments)    "made my joints  hurt"   Hctz [Hydrochlorothiazide] Rash   Hydralazine Rash   Penicillins Rash     Outpatient Medications Prior to Visit  Medication Sig Dispense Refill   amLODipine (NORVASC) 5 MG tablet Take 1 tablet (5 mg total) by mouth daily. 90 tablet 0   cloNIDine (CATAPRES) 0.3 MG tablet Take 1 tablet (0.3 mg total) by mouth daily. 90 tablet 1   ezetimibe (ZETIA) 10 MG tablet Take 1 tablet (10 mg total) by mouth daily. 90 tablet 1   finasteride (PROSCAR) 5 MG tablet Take 1 tablet by mouth daily.     meloxicam (MOBIC) 15 MG tablet Take 1 tablet (15 mg total) by mouth daily as needed. (Patient taking differently: Take 15 mg by mouth daily as needed for pain.) 90 tablet 0   Multiple Vitamins-Minerals (CENTRUM SILVER ULTRA MENS PO) Take 1 tablet by mouth daily.     Omega-3 Fatty Acids (FISH OIL PO) Take 1 tablet by mouth 2 (two) times daily.     rosuvastatin (CRESTOR) 10 MG tablet Take 1 tablet (10 mg total) by mouth at bedtime. 100 tablet 0   No facility-administered medications prior to visit.    Review of Systems  Constitutional:  Negative for chills, fever, malaise/fatigue and weight loss.  HENT:  Negative for congestion, sinus pain and sore throat.   Eyes: Negative.   Respiratory:  Positive for shortness of breath. Negative for cough, hemoptysis, sputum production and wheezing.   Cardiovascular:  Negative for chest pain, palpitations, orthopnea, claudication and leg swelling.  Gastrointestinal:  Negative for abdominal pain, heartburn, nausea and vomiting.  Genitourinary: Negative.   Musculoskeletal:  Negative for joint pain and myalgias.  Skin:  Negative for rash.  Neurological:  Negative for weakness.  Endo/Heme/Allergies: Negative.   Psychiatric/Behavioral: Negative.     Objective:   Vitals:   11/05/20 1129  BP: (!) 152/84  Pulse: 88  Resp: 18  Temp: 98.9 F (37.2 C)  TempSrc: Oral  SpO2: 95%  Weight: 198 lb 9.6 oz (90.1 kg)  Height: 6' (1.829 m)     Physical  Exam Constitutional:      General: He is not in acute distress. HENT:     Head: Normocephalic and atraumatic.  Eyes:     Extraocular Movements: Extraocular movements intact.     Conjunctiva/sclera: Conjunctivae normal.     Pupils: Pupils are equal, round, and reactive to light.  Cardiovascular:     Rate and Rhythm: Normal rate and regular rhythm.     Pulses: Normal pulses.     Heart sounds: Normal heart sounds. No murmur heard. Pulmonary:     Effort: Tachypnea present.     Breath sounds: No stridor or transmitted upper airway sounds.  No wheezing, rhonchi or rales.  Abdominal:     General: Bowel sounds are normal.     Palpations: Abdomen is soft.  Musculoskeletal:     Right lower leg: No edema.     Left lower leg: No edema.  Lymphadenopathy:     Cervical: No cervical adenopathy.  Skin:    General: Skin is warm and dry.  Neurological:     General: No focal deficit present.     Mental Status: He is alert.  Psychiatric:        Mood and Affect: Mood normal.        Behavior: Behavior normal.        Thought Content: Thought content normal.        Judgment: Judgment normal.    CBC    Component Value Date/Time   WBC 6.0 09/25/2020 0946   WBC 6.9 06/04/2020 1033   RBC 5.39 09/25/2020 0946   RBC 5.18 06/04/2020 1033   HGB 15.6 09/25/2020 0946   HCT 46.4 09/25/2020 0946   PLT 239 09/25/2020 0946   MCV 86 09/25/2020 0946   MCH 28.9 09/25/2020 0946   MCHC 33.6 09/25/2020 0946   MCHC 33.6 06/04/2020 1033   RDW 14.3 09/25/2020 0946   LYMPHSABS 1.3 09/25/2020 0946   MONOABS 0.8 06/04/2020 1033   EOSABS 0.1 09/25/2020 0946   BASOSABS 0.0 09/25/2020 0946   BMP Latest Ref Rng & Units 09/25/2020 06/19/2020 03/06/2020  Glucose 65 - 99 mg/dL 91 75 79  BUN 8 - 27 mg/dL '13 16 13  ' Creatinine 0.76 - 1.27 mg/dL 1.26 1.27 1.02  BUN/Creat Ratio 10 - '24 10 13 13  ' Sodium 134 - 144 mmol/L 141 142 140  Potassium 3.5 - 5.2 mmol/L 4.8 4.1 4.7  Chloride 96 - 106 mmol/L 102 101 101  CO2 20 -  29 mmol/L '20 21 24  ' Calcium 8.6 - 10.2 mg/dL 10.1 10.2 10.1   Chest imaging: CTA Chest 10/03/20 Minimal, bland appearing scarring or partial atelectasis in bilateral lung bases, including compressive atelectasis of the left lung base secondary to hiatal hernia.  - Ascending thoracic aortic aneurysm, 4.6 x 4.4cm  DG Sniff Test 08/13/20 No evidence for hemidiaphragmatic paralysis. Normal bilateral hemidiaphragmatic excursion. Hiatal hernia again noted.   HRCT Chest 06/21/20 - No evidence of interstitial lung disease. Mild air trapping is indicative of small airways disease. -  Large hiatal hernia.   PFT: PFT Results Latest Ref Rng & Units 07/12/2020  FVC-Pre L 3.56  FVC-Predicted Pre % 72  FVC-Post L 3.82  FVC-Predicted Post % 77  Pre FEV1/FVC % % 81  Post FEV1/FCV % % 85  FEV1-Pre L 2.89  FEV1-Predicted Pre % 78  FEV1-Post L 3.24  DLCO uncorrected ml/min/mmHg 22.98  DLCO UNC% % 81  DLCO corrected ml/min/mmHg 25.74  DLCO COR %Predicted % 91  DLVA Predicted % 112  TLC L 6.19  TLC % Predicted % 83  RV % Predicted % 90  2022: Within normal limits  Echo 05/11/20:  1. Left ventricular ejection fraction, by estimation, is 60 to 65%. The  left ventricle has normal function. The left ventricle has no regional  wall motion abnormalities. There is mild left ventricular hypertrophy.  Left ventricular diastolic parameters  are consistent with Grade I diastolic dysfunction (impaired relaxation).   2. Right ventricular systolic function is normal. The right ventricular  size is normal.   3. The mitral valve is normal in structure. No evidence of mitral valve  regurgitation.  No evidence of mitral stenosis.   4. The aortic valve is normal in structure. There is mild calcification  of the aortic valve. There is mild thickening of the aortic valve. Aortic  valve regurgitation is mild. No aortic stenosis is present.   5. There is moderate dilatation of the ascending aorta, measuring 45 mm.    6. The inferior vena cava is normal in size with greater than 50%  respiratory variability, suggesting right atrial pressure of 3 mmHg.    Assessment & Plan:   Shortness of breath - Plan: fluticasone-salmeterol (ADVAIR HFA) 230-21 MCG/ACT inhaler, Pulmonary function test  Gastroesophageal reflux disease, unspecified whether esophagitis present  Hiatal hernia - Plan: Ambulatory referral to Cardiothoracic Surgery  Discussion: Joe Arroyo is a 65 year old male, never smoker with hypertension, hyperlipidemia and moderate obstructive sleep apnea who returns to pulmonary clinic for evaluation of dyspnea.   The etiology of his dyspnea remains unclear at this time, but I believe the hiatal hernia is playing a role in his dyspnea and chest pressure symptoms. Hyperventilation syndrome is another possibility.     HRCT chest and his PFTs are not indicative of an interstitial lung disease. Less concern for pulmonary hypertension based on PFTs, TTE and caliber of pulmonary artery on CTA chest. His metabolic panel are within normal limits and he is not anemic. His PFTs are normal except for a mildly reduced FVC. Sniff test is unremarkable.   We will obtain an ABG on room air and try to obtain MIP/MEP values once again at [facility].   We will refer the patient to CT surgery for further evaluation of the hernia and also the ascending thoracic aorta aneurysm.   He has been referred to ENT for upper airway/vocal cord evaluation with appointment on 11/13/20.    We will try prolonged inhaler trial and monitor for benefit with Advair given concern for small airways disease on HRCT chest.   Follow up in 6 weeks.  Freda Jackson, MD [facility] Office: [phone]     Current Outpatient Medications:    fluticasone-salmeterol (ADVAIR HFA) 230-21 MCG/ACT inhaler, Inhale 2 puffs into the lungs 2 (two) times daily., Disp: 1 each, Rfl: 12   amLODipine (NORVASC) 5 MG tablet,  Take 1 tablet (5 mg total) by mouth daily., Disp: 90 tablet, Rfl: 0   cloNIDine (CATAPRES) 0.3 MG tablet, Take 1 tablet (0.3 mg total) by mouth daily., Disp: 90 tablet, Rfl: 1   ezetimibe (ZETIA) 10 MG tablet, Take 1 tablet (10 mg total) by mouth daily., Disp: 90 tablet, Rfl: 1   finasteride (PROSCAR) 5 MG tablet, Take 1 tablet by mouth daily., Disp: , Rfl:    meloxicam (MOBIC) 15 MG tablet, Take 1 tablet (15 mg total) by mouth daily as needed. (Patient taking differently: Take 15 mg by mouth daily as needed for pain.), Disp: 90 tablet, Rfl: 0   Multiple Vitamins-Minerals (CENTRUM SILVER ULTRA MENS PO), Take 1 tablet by mouth daily., Disp: , Rfl:    Omega-3 Fatty Acids (FISH OIL PO), Take 1 tablet by mouth 2 (two) times daily., Disp: , Rfl:    rosuvastatin (CRESTOR) 10 MG tablet, Take 1 tablet (10 mg total) by mouth at bedtime., Disp: 100 tablet, Rfl: 0

## 2020-11-05 NOTE — Patient Instructions (Signed)
Start Advair inhaler 2 puffs twice daily - rinse mouth out after each use  We will get the MIP/MEP results from [facility]  We will refer you to thoracic surgery to evaluate for hernia repair  We will wait for the ENT visit results and then order an ABG blood draw.

## 2020-11-08 ENCOUNTER — Telehealth: Payer: Self-pay | Admitting: Pulmonary Disease

## 2020-11-08 DIAGNOSIS — R0600 Dyspnea, unspecified: Secondary | ICD-10-CM

## 2020-11-08 DIAGNOSIS — R0602 Shortness of breath: Secondary | ICD-10-CM | POA: Diagnosis not present

## 2020-11-08 NOTE — Telephone Encounter (Signed)
Spoke with Joe Arroyo and she is aware that new order has been placed.  Will forward back to JD to make him aware.

## 2020-11-09 ENCOUNTER — Telehealth: Payer: Self-pay | Admitting: Pulmonary Disease

## 2020-11-09 NOTE — Telephone Encounter (Signed)
Spoke to patient, who stated that he went back and listened to his vm and sherry had left a vm.  He has sense talked to [place] and nothing further is needed.

## 2020-11-12 ENCOUNTER — Ambulatory Visit: Payer: Medicare HMO | Admitting: Cardiology

## 2020-11-13 ENCOUNTER — Telehealth: Payer: Self-pay | Admitting: Pulmonary Disease

## 2020-11-13 DIAGNOSIS — K449 Diaphragmatic hernia without obstruction or gangrene: Secondary | ICD-10-CM | POA: Diagnosis not present

## 2020-11-13 DIAGNOSIS — R0600 Dyspnea, unspecified: Secondary | ICD-10-CM | POA: Diagnosis not present

## 2020-11-13 DIAGNOSIS — R490 Dysphonia: Secondary | ICD-10-CM | POA: Diagnosis not present

## 2020-11-13 DIAGNOSIS — J479 Bronchiectasis, uncomplicated: Secondary | ICD-10-CM | POA: Diagnosis not present

## 2020-11-13 DIAGNOSIS — J342 Deviated nasal septum: Secondary | ICD-10-CM | POA: Diagnosis not present

## 2020-11-13 NOTE — Telephone Encounter (Signed)
I spoke with the patient on the phone today in regards to his recent ABG results.  The ABG done at [facility] shows pH 7.45, PCO2 32, PO2 89 and bicarb 22.  This ABG does show signs of chronic hyperventilation based on the low PCO2.   We discussed the potential for underlying anxiety that could be leading to his hyperventilation.  He does not complain of any overt anxiety or stress that could be driving this.  He has ENT evaluation tomorrow to evaluate his vocal cords and he also is scheduled for MIP/MEP measurements tomorrow.  He then has an appointment with thoracic surgery on 8/19 for evaluation of his hiatal hernia.  I discussed with Joe Arroyo that his shortness of breath is likely related to underlying anxiety or if it is still related to this large hiatal hernia.  Thoracic surgery will go over the benefits/risks of any recurrent active surgery.  We will await the evaluation from ENT as well and the MIP/MEP measurements.  Overall it is looking like he has no primary lung condition that can explain his ongoing dyspnea given normal spirometry, lung volumes and diffusion capacity, and normal sniff test for diaphragmatic muscle evaluation.  We will follow-up again with Joe Arroyo once the above testing and consults are completed.  Freda Jackson, MD [facility] Office: [phone]   See Amion for personal pager PCCM on call pager [phone] until 7pm. Please call Elink 7p-7a. [phone]

## 2020-11-14 ENCOUNTER — Ambulatory Visit (HOSPITAL_COMMUNITY)
Admission: RE | Admit: 2020-11-14 | Discharge: 2020-11-14 | Disposition: A | Discharge status: Home / Self Care | Payer: Medicare HMO | Source: Ambulatory Visit | Attending: Pulmonary Disease | Admitting: Pulmonary Disease

## 2020-11-14 ENCOUNTER — Other Ambulatory Visit: Payer: Self-pay

## 2020-11-14 DIAGNOSIS — R0602 Shortness of breath: Secondary | ICD-10-CM | POA: Insufficient documentation

## 2020-11-16 LAB — AFB CULTURE WITH SMEAR (NOT AT ARMC)
Acid Fast Culture: NEGATIVE
Acid Fast Smear: NEGATIVE

## 2020-11-22 NOTE — H&P (View-Only) (Signed)
[address]       [place] [address]             [phone]                    Irvine Hamblin [place] Medical Record [id number] Date of Birth: November 24, 1955  Referring: [name], MD Primary Care: [name], MD Primary Cardiologist: None  Chief Complaint:    Chief Complaint  Patient presents with   Hiatal Hernia    Initial surgical consult, CTA chest 6/22    History of Present Illness:    Joe Arroyo 65 y.o. male referred by Dr. 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  About 8 months ago, the patient developed worsening shortness of breath.  He is undergone extensive evaluation with nothing abnormal except the fact that he has this hiatal hernia.  He does endorse significant reflux.  He denies any dysphagia.  His weight has been stable.       Zubrod Score: At the time of surgery this patient's most appropriate activity status/level should be described as: '[x]'$     0    Normal activity, no symptoms '[]'$     1    Restricted in physical strenuous activity but ambulatory, able to do out light work '[]'$     2    Ambulatory and capable of self care, unable to do work activities, up and about               >50 % of waking hours                              '[]'$     3    Only limited self care, in bed greater than 50% of waking hours '[]'$     4    Completely disabled, no self care, confined to bed or chair '[]'$     5    Moribund   Past Medical History:  Diagnosis Date   Hyperlipidemia 05/14/2018   Hypertension 05/14/2018   Kidney stones    Sinus bradycardia 05/14/2018    Past Surgical History:  Procedure Laterality Date   COLONOSCOPY     LAMINECTOMY     MOLE REMOVAL     PROSTATE BIOPSY     TONSILLECTOMY AND ADENOIDECTOMY     UPPER GASTROINTESTINAL ENDOSCOPY      Family History  Problem Relation Age of Onset   Hyperlipidemia Mother    Aortic stenosis Mother    Valvular heart disease Mother    Colon  cancer Mother    Diabetes Mellitus II Mother    Cancer - Colon Mother    Cancer Mother    Stroke Mother    Cerebrovascular Accident Mother    Diabetes Mother    Hypertension Mother    Hyperlipidemia Father    Diabetes Mellitus II Father    Diabetes Father    Esophageal cancer Paternal Uncle    CAD Maternal Grandmother    Congestive Heart Failure Maternal Grandmother    Heart attack Maternal Grandmother    Heart failure Maternal Grandmother    Coronary artery disease Maternal Grandmother    Hyperlipidemia Maternal Grandmother      Social History   Tobacco Use  Smoking Status Never  Smokeless Tobacco Never    Social History   Substance and Sexual Activity  Alcohol Use Yes   Comment: occ  Allergies  Allergen Reactions   Bystolic [Nebivolol Hcl] Other (See Comments)    bradycardia   Carvedilol Other (See Comments)    bradycardia   Pravastatin Other (See Comments)    "made my joints hurt"   Hctz [Hydrochlorothiazide] Rash   Hydralazine Rash   Penicillins Rash    Current Outpatient Medications  Medication Sig Dispense Refill   amLODipine (NORVASC) 5 MG tablet Take 1 tablet (5 mg total) by mouth daily. 90 tablet 0   cloNIDine (CATAPRES) 0.3 MG tablet Take 1 tablet (0.3 mg total) by mouth daily. 90 tablet 1   ezetimibe (ZETIA) 10 MG tablet Take 1 tablet (10 mg total) by mouth daily. 90 tablet 1   finasteride (PROSCAR) 5 MG tablet Take 1 tablet by mouth daily.     fluticasone-salmeterol (ADVAIR HFA) 230-21 MCG/ACT inhaler Inhale 2 puffs into the lungs 2 (two) times daily. 1 each 12   meloxicam (MOBIC) 15 MG tablet Take 1 tablet (15 mg total) by mouth daily as needed. (Patient taking differently: Take 15 mg by mouth daily as needed for pain.) 90 tablet 0   Multiple Vitamins-Minerals (CENTRUM SILVER ULTRA MENS PO) Take 1 tablet by mouth daily.     Omega-3 Fatty Acids (FISH OIL PO) Take 1 tablet by mouth 2 (two) times daily.     rosuvastatin (CRESTOR) 10 MG tablet  Take 1 tablet (10 mg total) by mouth at bedtime. 100 tablet 0   No current facility-administered medications for this visit.    Review of Systems  Constitutional: Negative.   Respiratory:  Positive for shortness of breath. Negative for cough.   Gastrointestinal:  Positive for heartburn. Negative for abdominal pain, nausea and vomiting.  Neurological: Negative.     PHYSICAL EXAMINATION: BP (!) 175/93 (BP Location: Right Arm, Patient Position: Sitting)   Pulse 83   Resp 20   Ht 6' (1.829 m)   Wt 197 lb (89.4 kg)   SpO2 94% Comment: RA  BMI 26.72 kg/m  Physical Exam Constitutional:      General: He is not in acute distress.    Appearance: Normal appearance. He is normal weight. He is not toxic-appearing.  Cardiovascular:     Rate and Rhythm: Normal rate.  Pulmonary:     Comments: Increased work of breathing. Abdominal:     General: Abdomen is flat. There is no distension.     Palpations: Abdomen is soft.  Musculoskeletal:        General: Normal range of motion.     Cervical back: Normal range of motion.  Skin:    General: Skin is warm and dry.  Neurological:     Mental Status: He is alert.    Diagnostic Studies & Laboratory data:    CT Scan: CT scan canopy was reviewed from 10/03/2020.  He does have a large paraesophageal hernia that is mostly on the left side.     I have independently reviewed the above radiology studies  and reviewed the findings with the patient.   Recent Lab Findings: Lab Results  Component Value Date   WBC 6.0 09/25/2020   HGB 15.6 09/25/2020   HCT 46.4 09/25/2020   PLT 239 09/25/2020   GLUCOSE 91 09/25/2020   CHOL 145 09/25/2020   TRIG 126 09/25/2020   HDL 52 09/25/2020   LDLCALC 71 09/25/2020   ALT 41 09/25/2020   AST 38 09/25/2020   NA 141 09/25/2020   K 4.8 09/25/2020   CL 102 09/25/2020   CREATININE 1.26  09/25/2020   BUN 13 09/25/2020   CO2 20 09/25/2020   TSH 3.150 09/25/2020       Assessment / Plan:   65 year old  male referred for surgical evaluation of a mostly left-sided paraesophageal hernia.  His biggest complaint is his dyspnea despite undergoing extensive evaluation we have no other source for this problem.  I explained to him that this hernia has likely been present for several years and thus acutely developing shortness of breath 8 months ago may not be related.  I do think that fixing his hiatal hernia and removing this is a potential source for shortness of breath will be helpful.  It is also possible that he may be silently aspirating, and may have a pneumonitis from this hernia.  He is agreeable to proceed and is tentatively scheduled for the next few weeks.  He will undergo an upper endoscopy followed by robotic assisted paraesophageal hernia repair with fundoplication.       I  spent 40 minutes with the patient face to face counseling and coordination of care.    Lajuana Matte 11/23/2020 4:50 PM

## 2020-11-22 NOTE — Progress Notes (Signed)
[address]       [place] [address]             [id number]                    Joe Arroyo [place] Medical Record [id number] Date of Birth: 1955/11/12  Referring: [name], MD Primary Care: [name], MD Primary Cardiologist: None  Chief Complaint:    Chief Complaint  Patient presents with   Hiatal Hernia    Initial surgical consult, CTA chest 6/22    History of Present Illness:    Joe Arroyo 65 y.o. male referred by Dr. 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  About 8 months ago, the patient developed worsening shortness of breath.  He is undergone extensive evaluation with nothing abnormal except the fact that he has this hiatal hernia.  He does endorse significant reflux.  He denies any dysphagia.  His weight has been stable.       Zubrod Score: At the time of surgery this patient's most appropriate activity status/level should be described as: '[x]'$     0    Normal activity, no symptoms '[]'$     1    Restricted in physical strenuous activity but ambulatory, able to do out light work '[]'$     2    Ambulatory and capable of self care, unable to do work activities, up and about               >50 % of waking hours                              '[]'$     3    Only limited self care, in bed greater than 50% of waking hours '[]'$     4    Completely disabled, no self care, confined to bed or chair '[]'$     5    Moribund   Past Medical History:  Diagnosis Date   Hyperlipidemia 05/14/2018   Hypertension 05/14/2018   Kidney stones    Sinus bradycardia 05/14/2018    Past Surgical History:  Procedure Laterality Date   COLONOSCOPY     LAMINECTOMY     MOLE REMOVAL     PROSTATE BIOPSY     TONSILLECTOMY AND ADENOIDECTOMY     UPPER GASTROINTESTINAL ENDOSCOPY      Family History  Problem Relation Age of Onset   Hyperlipidemia Mother    Aortic stenosis Mother    Valvular heart disease Mother    Colon  cancer Mother    Diabetes Mellitus II Mother    Cancer - Colon Mother    Cancer Mother    Stroke Mother    Cerebrovascular Accident Mother    Diabetes Mother    Hypertension Mother    Hyperlipidemia Father    Diabetes Mellitus II Father    Diabetes Father    Esophageal cancer Paternal Uncle    CAD Maternal Grandmother    Congestive Heart Failure Maternal Grandmother    Heart attack Maternal Grandmother    Heart failure Maternal Grandmother    Coronary artery disease Maternal Grandmother    Hyperlipidemia Maternal Grandmother      Social History   Tobacco Use  Smoking Status Never  Smokeless Tobacco Never    Social History   Substance and Sexual Activity  Alcohol Use Yes   Comment: occ  Allergies  Allergen Reactions   Bystolic [Nebivolol Hcl] Other (See Comments)    bradycardia   Carvedilol Other (See Comments)    bradycardia   Pravastatin Other (See Comments)    "made my joints hurt"   Hctz [Hydrochlorothiazide] Rash   Hydralazine Rash   Penicillins Rash    Current Outpatient Medications  Medication Sig Dispense Refill   amLODipine (NORVASC) 5 MG tablet Take 1 tablet (5 mg total) by mouth daily. 90 tablet 0   cloNIDine (CATAPRES) 0.3 MG tablet Take 1 tablet (0.3 mg total) by mouth daily. 90 tablet 1   ezetimibe (ZETIA) 10 MG tablet Take 1 tablet (10 mg total) by mouth daily. 90 tablet 1   finasteride (PROSCAR) 5 MG tablet Take 1 tablet by mouth daily.     fluticasone-salmeterol (ADVAIR HFA) 230-21 MCG/ACT inhaler Inhale 2 puffs into the lungs 2 (two) times daily. 1 each 12   meloxicam (MOBIC) 15 MG tablet Take 1 tablet (15 mg total) by mouth daily as needed. (Patient taking differently: Take 15 mg by mouth daily as needed for pain.) 90 tablet 0   Multiple Vitamins-Minerals (CENTRUM SILVER ULTRA MENS PO) Take 1 tablet by mouth daily.     Omega-3 Fatty Acids (FISH OIL PO) Take 1 tablet by mouth 2 (two) times daily.     rosuvastatin (CRESTOR) 10 MG tablet  Take 1 tablet (10 mg total) by mouth at bedtime. 100 tablet 0   No current facility-administered medications for this visit.    Review of Systems  Constitutional: Negative.   Respiratory:  Positive for shortness of breath. Negative for cough.   Gastrointestinal:  Positive for heartburn. Negative for abdominal pain, nausea and vomiting.  Neurological: Negative.     PHYSICAL EXAMINATION: BP (!) 175/93 (BP Location: Right Arm, Patient Position: Sitting)   Pulse 83   Resp 20   Ht 6' (1.829 m)   Wt 197 lb (89.4 kg)   SpO2 94% Comment: RA  BMI 26.72 kg/m  Physical Exam Constitutional:      General: He is not in acute distress.    Appearance: Normal appearance. He is normal weight. He is not toxic-appearing.  Cardiovascular:     Rate and Rhythm: Normal rate.  Pulmonary:     Comments: Increased work of breathing. Abdominal:     General: Abdomen is flat. There is no distension.     Palpations: Abdomen is soft.  Musculoskeletal:        General: Normal range of motion.     Cervical back: Normal range of motion.  Skin:    General: Skin is warm and dry.  Neurological:     Mental Status: He is alert.    Diagnostic Studies & Laboratory data:    CT Scan: CT scan canopy was reviewed from 10/03/2020.  He does have a large paraesophageal hernia that is mostly on the left side.     I have independently reviewed the above radiology studies  and reviewed the findings with the patient.   Recent Lab Findings: Lab Results  Component Value Date   WBC 6.0 09/25/2020   HGB 15.6 09/25/2020   HCT 46.4 09/25/2020   PLT 239 09/25/2020   GLUCOSE 91 09/25/2020   CHOL 145 09/25/2020   TRIG 126 09/25/2020   HDL 52 09/25/2020   LDLCALC 71 09/25/2020   ALT 41 09/25/2020   AST 38 09/25/2020   NA 141 09/25/2020   K 4.8 09/25/2020   CL 102 09/25/2020   CREATININE 1.26  09/25/2020   BUN 13 09/25/2020   CO2 20 09/25/2020   TSH 3.150 09/25/2020       Assessment / Plan:   65 year old  male referred for surgical evaluation of a mostly left-sided paraesophageal hernia.  His biggest complaint is his dyspnea despite undergoing extensive evaluation we have no other source for this problem.  I explained to him that this hernia has likely been present for several years and thus acutely developing shortness of breath 8 months ago may not be related.  I do think that fixing his hiatal hernia and removing this is a potential source for shortness of breath will be helpful.  It is also possible that he may be silently aspirating, and may have a pneumonitis from this hernia.  He is agreeable to proceed and is tentatively scheduled for the next few weeks.  He will undergo an upper endoscopy followed by robotic assisted paraesophageal hernia repair with fundoplication.       I  spent 40 minutes with the patient face to face counseling and coordination of care.    Lajuana Matte 11/23/2020 4:50 PM

## 2020-11-23 ENCOUNTER — Encounter: Payer: Self-pay | Admitting: *Deleted

## 2020-11-23 ENCOUNTER — Other Ambulatory Visit: Payer: Self-pay

## 2020-11-23 ENCOUNTER — Encounter: Payer: Self-pay | Admitting: Thoracic Surgery (Cardiothoracic Vascular Surgery)

## 2020-11-23 ENCOUNTER — Institutional Professional Consult (permissible substitution): Payer: Medicare HMO | Admitting: Thoracic Surgery (Cardiothoracic Vascular Surgery)

## 2020-11-23 ENCOUNTER — Other Ambulatory Visit: Payer: Self-pay | Admitting: *Deleted

## 2020-11-23 VITALS — BP 175/93 | HR 83 | Resp 20 | Ht 72.0 in | Wt 197.0 lb

## 2020-11-23 DIAGNOSIS — K449 Diaphragmatic hernia without obstruction or gangrene: Secondary | ICD-10-CM | POA: Diagnosis not present

## 2020-11-30 NOTE — Pre-Procedure Instructions (Signed)
Surgical Instructions    Your procedure is scheduled on Wednesday 12/05/20.   Report to [facility] Main Entrance "A" at 10:00 A.M., then check in with the Admitting office.  Call this number if you have problems the morning of surgery:  [phone]   If you have any questions prior to your surgery date call [phone]: Open Monday-Friday 8am-4pm    Remember:  Do not eat or drink after midnight the night before your surgery    Take these medicines the morning of surgery with A SIP OF WATER   amLODipine (NORVASC)  ezetimibe (ZETIA)   finasteride (PROSCAR)  cloNIDine (CATAPRES)  fluticasone-salmeterol (ADVAIR HFA)   As of today, STOP taking any Aspirin (unless otherwise instructed by your surgeon) Meloxicam (Mobic) Aleve, Naproxen, Ibuprofen, Motrin, Advil, Goody's, BC's, all herbal medications, fish oil, and all vitamins.                     Do NOT Smoke (Tobacco/Vaping) or drink Alcohol 24 hours prior to your procedure.  If you use a CPAP at night, you may bring all equipment for your overnight stay.   Contacts, glasses, piercing's, hearing aid's, dentures or partials may not be worn into surgery, please bring cases for these belongings.    For patients admitted to the hospital, discharge time will be determined by your treatment team.   Patients discharged the day of surgery will not be allowed to drive home, and someone needs to stay with them for 24 hours.  ONLY 1 SUPPORT PERSON MAY BE PRESENT WHILE YOU ARE IN SURGERY. IF YOU ARE TO BE ADMITTED ONCE YOU ARE IN YOUR ROOM YOU WILL BE ALLOWED TWO (2) VISITORS.  Minor children may have two parents present. Special consideration for safety and communication needs will be reviewed on a case by case basis.   Special instructions:   [facility]- Preparing For Surgery  Before surgery, you can play an important role. Because skin is not sterile, your skin needs to be as free of germs as possible. You can reduce the number of germs  on your skin by washing with CHG (chlorahexidine gluconate) Soap before surgery.  CHG is an antiseptic cleaner which kills germs and bonds with the skin to continue killing germs even after washing.    Oral Hygiene is also important to reduce your risk of infection.  Remember - BRUSH YOUR TEETH THE MORNING OF SURGERY WITH YOUR REGULAR TOOTHPASTE  Please do not use if you have an allergy to CHG or antibacterial soaps. If your skin becomes reddened/irritated stop using the CHG.  Do not shave (including legs and underarms) for at least 48 hours prior to first CHG shower. It is OK to shave your face.  Please follow these instructions carefully.   Shower the NIGHT BEFORE SURGERY and the MORNING OF SURGERY  If you chose to wash your hair, wash your hair first as usual with your normal shampoo.  After you shampoo, rinse your hair and body thoroughly to remove the shampoo.  Use CHG Soap as you would any other liquid soap. You can apply CHG directly to the skin and wash gently with a scrungie or a clean washcloth.   Apply the CHG Soap to your body ONLY FROM THE NECK DOWN.  Do not use on open wounds or open sores. Avoid contact with your eyes, ears, mouth and genitals (private parts). Wash Face and genitals (private parts)  with your normal soap.   Wash thoroughly, paying special  attention to the area where your surgery will be performed.  Thoroughly rinse your body with warm water from the neck down.  DO NOT shower/wash with your normal soap after using and rinsing off the CHG Soap.  Pat yourself dry with a CLEAN TOWEL.  Wear CLEAN PAJAMAS to bed the night before surgery  Place CLEAN SHEETS on your bed the night before your surgery  DO NOT SLEEP WITH PETS.   Day of Surgery: Shower with CHG soap. Do not wear jewelry, make up, nail polish, gel polish, artificial nails, or any other type of covering on natural nails including finger and toenails. If patients have artificial nails, gel  coating, etc. that need to be removed by a nail salon please have this removed prior to surgery. Surgery may need to be canceled/delayed if the surgeon/ anesthesia feels like the patient is unable to be adequately monitored. Do not wear lotions, powders, perfumes/colognes, or deodorant. Do not shave 48 hours prior to surgery.  Men may shave face and neck. Do not bring valuables to the hospital. [facility] is not responsible for any belongings or valuables. Wear Clean/Comfortable clothing the morning of surgery Remember to brush your teeth WITH YOUR REGULAR TOOTHPASTE.   Please read over the following fact sheets that you were given.

## 2020-12-03 ENCOUNTER — Encounter (HOSPITAL_COMMUNITY): Payer: Self-pay

## 2020-12-03 ENCOUNTER — Other Ambulatory Visit: Payer: Self-pay

## 2020-12-03 ENCOUNTER — Encounter (HOSPITAL_COMMUNITY)
Admission: RE | Admit: 2020-12-03 | Discharge: 2020-12-03 | Disposition: A | Discharge status: Home / Self Care | Payer: MEDICARE | Source: Ambulatory Visit | Attending: Thoracic Surgery (Cardiothoracic Vascular Surgery) | Admitting: Thoracic Surgery (Cardiothoracic Vascular Surgery)

## 2020-12-03 ENCOUNTER — Ambulatory Visit (HOSPITAL_COMMUNITY)
Admission: RE | Admit: 2020-12-03 | Discharge: 2020-12-03 | Disposition: A | Discharge status: Home / Self Care | Payer: MEDICARE | Source: Ambulatory Visit | Attending: Thoracic Surgery (Cardiothoracic Vascular Surgery) | Admitting: Thoracic Surgery (Cardiothoracic Vascular Surgery)

## 2020-12-03 DIAGNOSIS — Z791 Long term (current) use of non-steroidal anti-inflammatories (NSAID): Secondary | ICD-10-CM | POA: Insufficient documentation

## 2020-12-03 DIAGNOSIS — Z8249 Family history of ischemic heart disease and other diseases of the circulatory system: Secondary | ICD-10-CM | POA: Diagnosis not present

## 2020-12-03 DIAGNOSIS — Z7901 Long term (current) use of anticoagulants: Secondary | ICD-10-CM | POA: Insufficient documentation

## 2020-12-03 DIAGNOSIS — K219 Gastro-esophageal reflux disease without esophagitis: Secondary | ICD-10-CM | POA: Diagnosis not present

## 2020-12-03 DIAGNOSIS — J9811 Atelectasis: Secondary | ICD-10-CM | POA: Diagnosis not present

## 2020-12-03 DIAGNOSIS — Z01818 Encounter for other preprocedural examination: Secondary | ICD-10-CM | POA: Insufficient documentation

## 2020-12-03 DIAGNOSIS — K449 Diaphragmatic hernia without obstruction or gangrene: Secondary | ICD-10-CM | POA: Diagnosis not present

## 2020-12-03 DIAGNOSIS — Z79899 Other long term (current) drug therapy: Secondary | ICD-10-CM | POA: Insufficient documentation

## 2020-12-03 DIAGNOSIS — Z20822 Contact with and (suspected) exposure to covid-19: Secondary | ICD-10-CM | POA: Diagnosis not present

## 2020-12-03 DIAGNOSIS — E785 Hyperlipidemia, unspecified: Secondary | ICD-10-CM | POA: Diagnosis not present

## 2020-12-03 DIAGNOSIS — I712 Thoracic aortic aneurysm, without rupture: Secondary | ICD-10-CM | POA: Insufficient documentation

## 2020-12-03 DIAGNOSIS — I10 Essential (primary) hypertension: Secondary | ICD-10-CM | POA: Diagnosis not present

## 2020-12-03 DIAGNOSIS — Z888 Allergy status to other drugs, medicaments and biological substances status: Secondary | ICD-10-CM | POA: Diagnosis not present

## 2020-12-03 DIAGNOSIS — Z880 Allergy status to penicillin: Secondary | ICD-10-CM | POA: Diagnosis not present

## 2020-12-03 DIAGNOSIS — Z83438 Family history of other disorder of lipoprotein metabolism and other lipidemia: Secondary | ICD-10-CM | POA: Diagnosis not present

## 2020-12-03 DIAGNOSIS — Z7951 Long term (current) use of inhaled steroids: Secondary | ICD-10-CM | POA: Diagnosis not present

## 2020-12-03 HISTORY — DX: Sleep apnea, unspecified: G47.30

## 2020-12-03 HISTORY — DX: Personal history of urinary calculi: Z87.442

## 2020-12-03 HISTORY — DX: Aneurysm of the ascending aorta, without rupture: I71.21

## 2020-12-03 HISTORY — DX: Personal history of other diseases of the digestive system: Z87.19

## 2020-12-03 HISTORY — DX: Dyspnea, unspecified: R06.00

## 2020-12-03 HISTORY — DX: Thoracic aortic aneurysm, without rupture: I71.2

## 2020-12-03 LAB — BLOOD GAS, ARTERIAL
Acid-base deficit: 3.3 mmol/L — ABNORMAL HIGH (ref 0.0–2.0)
Bicarbonate: 20.8 mmol/L (ref 20.0–28.0)
FIO2: 21
O2 Saturation: 97.7 %
Patient temperature: 37
pCO2 arterial: 35.1 mmHg (ref 32.0–48.0)
pH, Arterial: 7.39 (ref 7.350–7.450)
pO2, Arterial: 93.7 mmHg (ref 83.0–108.0)

## 2020-12-03 LAB — COMPREHENSIVE METABOLIC PANEL
ALT: 48 U/L — ABNORMAL HIGH (ref 0–44)
AST: 58 U/L — ABNORMAL HIGH (ref 15–41)
Albumin: 4.1 g/dL (ref 3.5–5.0)
Alkaline Phosphatase: 104 U/L (ref 38–126)
Anion gap: 14 (ref 5–15)
BUN: 12 mg/dL (ref 8–23)
CO2: 20 mmol/L — ABNORMAL LOW (ref 22–32)
Calcium: 9.6 mg/dL (ref 8.9–10.3)
Chloride: 106 mmol/L (ref 98–111)
Creatinine, Ser: 1.26 mg/dL — ABNORMAL HIGH (ref 0.61–1.24)
GFR, Estimated: >60 mL/min (ref >60)
Glucose, Bld: 121 mg/dL — ABNORMAL HIGH (ref 70–99)
Potassium: 3.9 mmol/L (ref 3.5–5.1)
Sodium: 140 mmol/L (ref 135–145)
Total Bilirubin: 0.6 mg/dL (ref 0.3–1.2)
Total Protein: 7.2 g/dL (ref 6.5–8.1)

## 2020-12-03 LAB — URINALYSIS, ROUTINE W REFLEX MICROSCOPIC
Bacteria, UA: NONE SEEN
Bilirubin Urine: NEGATIVE
Glucose, UA: NEGATIVE mg/dL
Ketones, ur: NEGATIVE mg/dL
Leukocytes,Ua: NEGATIVE
Nitrite: NEGATIVE
Protein, ur: NEGATIVE mg/dL
Specific Gravity, Urine: 1.005 (ref 1.005–1.030)
pH: 6 (ref 5.0–8.0)

## 2020-12-03 LAB — CBC
HCT: 47.2 % (ref 39.0–52.0)
Hemoglobin: 15.4 g/dL (ref 13.0–17.0)
MCH: 28.3 pg (ref 26.0–34.0)
MCHC: 32.6 g/dL (ref 30.0–36.0)
MCV: 86.6 fL (ref 80.0–100.0)
Platelets: 249 10*3/uL (ref 150–400)
RBC: 5.45 MIL/uL (ref 4.22–5.81)
RDW: 14.8 % (ref 11.5–15.5)
WBC: 8.1 10*3/uL (ref 4.0–10.5)
nRBC: 0 % (ref 0.0–0.2)

## 2020-12-03 LAB — APTT: aPTT: 29 seconds (ref 24–36)

## 2020-12-03 LAB — SARS CORONAVIRUS 2 (TAT 6-24 HRS): SARS Coronavirus 2: NEGATIVE

## 2020-12-03 LAB — SURGICAL PCR SCREEN
MRSA, PCR: NEGATIVE
Staphylococcus aureus: NEGATIVE

## 2020-12-03 LAB — PROTIME-INR
INR: 1 (ref 0.8–1.2)
Prothrombin Time: 13.2 seconds (ref 11.4–15.2)

## 2020-12-03 LAB — TYPE AND SCREEN
ABO/RH(D): O POS
Antibody Screen: NEGATIVE

## 2020-12-03 NOTE — Progress Notes (Signed)
PCP - Dr. [name] Cardiologist - Dr. Shirlee More  Chest x-ray - 12/03/20 EKG - 12/03/20 Stress Test - 15+ years. Reported normal, no f/u needed. ECHO - 05/11/20 Cardiac Cath - denies  Sleep Study - borderline study. No CPAP ordered.  CPAP - denies  Blood Thinner Instructions: n/a Aspirin Instructions: n/a  COVID TEST- 12/03/20 done in PAT.  Anesthesia review: Yes, heart history.   Patient denies shortness of breath, fever, cough and chest pain at PAT appointment   All instructions explained to the patient, with a verbal understanding of the material. Patient agrees to go over the instructions while at home for a better understanding. Patient also instructed to self quarantine after being tested for COVID-19. The opportunity to ask questions was provided.

## 2020-12-04 ENCOUNTER — Encounter (HOSPITAL_COMMUNITY): Payer: Self-pay

## 2020-12-04 NOTE — Anesthesia Preprocedure Evaluation (Addendum)
Anesthesia Evaluation  Patient identified by MRN, date of birth, ID band Patient awake    Reviewed: Allergy & Precautions, NPO status , Patient's Chart, lab work & pertinent test results  History of Anesthesia Complications Negative for: history of anesthetic complications  Airway Mallampati: II  TM Distance: >3 FB Neck ROM: Full    Dental  (+) Dental Advisory Given, Teeth Intact,    Pulmonary shortness of breath, neg sleep apnea, neg COPD, neg recent URI,    breath sounds clear to auscultation       Cardiovascular hypertension, Pt. on medications (-) angina(-) Past MI and (-) CHF  Rhythm:Regular  1. Left ventricular ejection fraction, by estimation, is 60 to 65%. The  left ventricle has normal function. The left ventricle has no regional  wall motion abnormalities. There is mild left ventricular hypertrophy.  Left ventricular diastolic parameters  are consistent with Grade I diastolic dysfunction (impaired relaxation).  2. Right ventricular systolic function is normal. The right ventricular  size is normal.  3. The mitral valve is normal in structure. No evidence of mitral valve  regurgitation. No evidence of mitral stenosis.  4. The aortic valve is normal in structure. There is mild calcification  of the aortic valve. There is mild thickening of the aortic valve. Aortic  valve regurgitation is mild. No aortic stenosis is present.  5. There is moderate dilatation of the ascending aorta, measuring 45 mm.  6. The inferior vena cava is normal in size with greater than 50%  respiratory variability, suggesting right atrial pressure of 3 mmHg.    Neuro/Psych  Headaches, neg Seizures negative psych ROS   GI/Hepatic Neg liver ROS, hiatal hernia, GERD  ,  Endo/Other  negative endocrine ROS  Renal/GU Renal diseaseLab Results      Component                Value               Date                      CREATININE                1.26 (H)            12/03/2020                Musculoskeletal negative musculoskeletal ROS (+)   Abdominal   Peds  Hematology negative hematology ROS (+)   Anesthesia Other Findings   Reproductive/Obstetrics                            Anesthesia Physical Anesthesia Plan  ASA: 2  Anesthesia Plan: General   Post-op Pain Management:    Induction: Intravenous  PONV Risk Score and Plan: 2 and Dexamethasone and Ondansetron  Airway Management Planned: Oral ETT and Double Lumen EBT  Additional Equipment:   Intra-op Plan:   Post-operative Plan: Extubation in OR  Informed Consent: I have reviewed the patients History and Physical, chart, labs and discussed the procedure including the risks, benefits and alternatives for the proposed anesthesia with the patient or authorized representative who has indicated his/her understanding and acceptance.     Dental advisory given  Plan Discussed with: CRNA and Anesthesiologist  Anesthesia Plan Comments: (PAT note written 12/04/2020 by Myra Gianotti, PA-C. )       Anesthesia Quick Evaluation

## 2020-12-04 NOTE — Progress Notes (Signed)
Anesthesia Chart Review:  Case: [id number] Date/Time: 12/05/20 1145   Procedures:      XI ROBOTIC ASSISTED PARAESOPHAGEAL HERNIA REPAIR AND FUNDOPLICATION (Chest)     ESOPHAGOGASTRODUODENOSCOPY (EGD)   Anesthesia type: General   Pre-op diagnosis: PEH   [facility]: [facility] ROOM 10 / [facility]   Surgeons: Lajuana Matte, MD       DISCUSSION: Patient is a 65 year old male scheduled for the above procedure.  History includes never smoker, HTN, HLD, sinus bradycardia, dyspnea, ascending thoracic aortic aneurysm, hiatal (paraesophageal) hernia, spinal surgery ("laminectomy").  Last cardiology visit with Dr. Bettina Gavia on 10/08/20. He wrote, "He continues to be very short of breath out of proportion to findings by pulmonary hiatal hernia and untreated sleep apnea. He has no findings of congestive heart failure As recommended by GI I will refer him to ENT for evaluation of his upper airway...." Six month follow-up CTA planned for TAA evaluation.  According to 11/13/20 pulmonology visit with Dr. Erin Fulling, "...Overall it is looking like he has no primary lung condition that can explain his ongoing dyspnea given normal spirometry, lung volumes and diffusion capacity, and normal sniff test for diaphragmatic muscle evaluation." At that time, patient still had pending CT surgery evaluation for large hiatal hernia which was felt possibly a contributing factor, as well as anxiety with hyperventilation and/or undiagnosed vocal cord dysfunction (pending ENT evaluation).   Dr. Kipp Brood classified patient's Zubrod Score as 0: Normal activity, no symptoms.  12/03/2020 presurgical COVID-19 test negative.  Anesthesia team to evaluate on the day of surgery.   VS: BP (!) 162/95   Pulse 100   Temp 36.8 C (Oral)   Resp 18   Ht 6' (1.829 m)   Wt 89.6 kg   SpO2 99%   BMI 26.79 kg/m    PROVIDERS: [name], MD is PCP Freda Jackson, MD is pulmonologist Shirlee More, MD is cardiologist Wray Kearns, MD is  GI   LABS: Labs reviewed: Acceptable for surgery. AST and ALT mildly elevated--previously mildly elevated 06/19/20, although AST/ALT normal 09/25/20. PT/PTT and PLT count normal. Cr 1.26 is consistent with recent prior results.   (all labs ordered are listed, but only abnormal results are displayed)  Labs Reviewed  COMPREHENSIVE METABOLIC PANEL - Abnormal; Notable for the following components:      Result Value   CO2 20 (*)    Glucose, Bld 121 (*)    Creatinine, Ser 1.26 (*)    AST 58 (*)    ALT 48 (*)    All other components within normal limits  BLOOD GAS, ARTERIAL - Abnormal; Notable for the following components:   Acid-base deficit 3.3 (*)    Allens test (pass/fail) BRACHIAL ARTERY (*)    All other components within normal limits  URINALYSIS, ROUTINE W REFLEX MICROSCOPIC - Abnormal; Notable for the following components:   Color, Urine STRAW (*)    Hgb urine dipstick SMALL (*)    All other components within normal limits  SURGICAL PCR SCREEN  SARS CORONAVIRUS 2 (TAT 6-24 HRS)  CBC  PROTIME-INR  APTT  TYPE AND SCREEN    Home Sleep Test 02/20/20: IMPRESSION: OSA (obstructive sleep apnea), moderate  RECOMMENDATION:  This home sleep test demonstrates moderate obstructive sleep  apnea with a total AHI of 15.6/hour and O2 nadir of 85%.  Treatment with positive airway pressure is recommended. The  patient will be advised to proceed with an autoPAP  titration/trial at home for now...   IMAGES: CXR  12/03/20: FINDINGS: - Cardiomediastinal silhouette unchanged in size and contour. No evidence of central vascular congestion. No interlobular septal thickening. - Double density projecting over the lower mediastinum - No pneumothorax or pleural effusion. Coarsened interstitial markings, with no confluent airspace disease. - No acute displaced fracture. Degenerative changes of the spine. IMPRESSION: Negative for acute cardiopulmonary disease. Hiatal hernia  CTA Chest 10/03/20  [facility]): IMPRESSION: Negative examination for pulmonary embolism. Minimal, bland appearing scarring and/or partial atelectasis in the bilateral lung bases, including compressive atelectasis of the left lung base secondary to hiatal hernia. Hiatal hernia. Cardiomegaly. Unchanged enlargement of the tubular ascending thoracic aorta, measuring up to 4.6 x 4.4 cm.  Ascending thoracic aortic aneurysm. Aortic atherosclerosis. (No evidence of AAA or dissection on 12/01/17 CT[facility] abd at [facility])  Sniff Test 08/13/20: MPRESSION: No evidence for hemidiaphragmatic paralysis. Normal bilateral hemidiaphragmatic excursion.   EKG: 12/03/20: NSR   CV: Echo 05/11/20: IMPRESSIONS   1. Left ventricular ejection fraction, by estimation, is 60 to 65%. The  left ventricle has normal function. The left ventricle has no regional  wall motion abnormalities. There is mild left ventricular hypertrophy.  Left ventricular diastolic parameters  are consistent with Grade I diastolic dysfunction (impaired relaxation).   2. Right ventricular systolic function is normal. The right ventricular  size is normal.   3. The mitral valve is normal in structure. No evidence of mitral valve  regurgitation. No evidence of mitral stenosis.   4. The aortic valve is normal in structure. There is mild calcification  of the aortic valve. There is mild thickening of the aortic valve. Aortic  valve regurgitation is mild. No aortic stenosis is present.   5. There is moderate dilatation of the ascending aorta, measuring 45 mm.   6. The inferior vena cava is normal in size with greater than 50%  respiratory variability, suggesting right atrial pressure of 3 mmHg.    Long term cardiac event moniotr 09/06/19-09/20/19:  Indication:                    Syncope and collapse - Baseline rhythm: Sinus - Minimum heart rate: 45 BPM.  Average heart rate: 66 BPM.  Maximal heart rate 145 BPM. - Atrial arrhythmia: None significant.   Rare brief atrial runs including atrial tachycardia with variable block versus as a possible diagnosis. - Ventricular arrhythmia: Rare PVCs.  Occasional couplets and triplets versus - Conduction abnormality: None significant - Symptoms: None significant Conclusion:  Mildly abnormal but largely unremarkable event monitor. Interpreting  cardiologist: Jenean Lindau, MD  Date: 10/11/2019 12:44 PM   Previous stress test 04/25/06.   Past Medical History:  Diagnosis Date   Dyspnea    History of hiatal hernia    History of kidney stones    Hyperlipidemia 05/14/2018   Hypertension 05/14/2018   Kidney stones    Sinus bradycardia 05/14/2018   Sleep apnea    moderate OSA 02/20/20 HST   Thoracic ascending aortic aneurysm (HCC)    ascending TAA 4.6 x 4.4 cm 10/03/20 CTA chest    Past Surgical History:  Procedure Laterality Date   COLONOSCOPY     LAMINECTOMY     MOLE REMOVAL     PROSTATE BIOPSY     TONSILLECTOMY AND ADENOIDECTOMY     UPPER GASTROINTESTINAL ENDOSCOPY      MEDICATIONS:  amLODipine (NORVASC) 5 MG tablet   cloNIDine (CATAPRES) 0.3 MG tablet   esomeprazole (NEXIUM) 20 MG capsule   ezetimibe (ZETIA) 10 MG  tablet   finasteride (PROSCAR) 5 MG tablet   fluticasone-salmeterol (ADVAIR HFA) 230-21 MCG/ACT inhaler   meloxicam (MOBIC) 15 MG tablet   Multiple Vitamins-Minerals (CENTRUM SILVER ULTRA MENS PO)   Omega-3 Fatty Acids (FISH OIL PO)   rosuvastatin (CRESTOR) 10 MG tablet   No current facility-administered medications for this encounter.    Myra Gianotti, PA-C Surgical Short Stay/Anesthesiology [facility] Phone [phone] [facility] Phone [phone] 12/04/2020 1:11 PM

## 2020-12-05 ENCOUNTER — Inpatient Hospital Stay (HOSPITAL_COMMUNITY): Payer: MEDICARE | Admitting: Vascular Surgery

## 2020-12-05 ENCOUNTER — Inpatient Hospital Stay (HOSPITAL_COMMUNITY): Payer: MEDICARE | Admitting: Certified Registered"

## 2020-12-05 ENCOUNTER — Inpatient Hospital Stay (HOSPITAL_COMMUNITY)
Admission: RE | Admit: 2020-12-05 | Discharge: 2020-12-07 | DRG: 327 | Disposition: A | Discharge status: Home / Self Care | Payer: MEDICARE | Attending: Thoracic Surgery (Cardiothoracic Vascular Surgery) | Admitting: Thoracic Surgery (Cardiothoracic Vascular Surgery)

## 2020-12-05 ENCOUNTER — Encounter: Payer: Self-pay | Admitting: Adult Health

## 2020-12-05 ENCOUNTER — Encounter (HOSPITAL_COMMUNITY): Payer: Self-pay | Admitting: Thoracic Surgery (Cardiothoracic Vascular Surgery)

## 2020-12-05 ENCOUNTER — Other Ambulatory Visit: Payer: Self-pay

## 2020-12-05 ENCOUNTER — Encounter (HOSPITAL_COMMUNITY)
Admission: RE | Disposition: A | Discharge status: Home / Self Care | Payer: Self-pay | Source: Home / Self Care | Attending: Thoracic Surgery (Cardiothoracic Vascular Surgery)

## 2020-12-05 ENCOUNTER — Inpatient Hospital Stay (HOSPITAL_COMMUNITY): Payer: MEDICARE

## 2020-12-05 DIAGNOSIS — Z01818 Encounter for other preprocedural examination: Secondary | ICD-10-CM | POA: Diagnosis not present

## 2020-12-05 DIAGNOSIS — J939 Pneumothorax, unspecified: Secondary | ICD-10-CM

## 2020-12-05 DIAGNOSIS — J9811 Atelectasis: Secondary | ICD-10-CM | POA: Diagnosis present

## 2020-12-05 DIAGNOSIS — Z880 Allergy status to penicillin: Secondary | ICD-10-CM | POA: Diagnosis not present

## 2020-12-05 DIAGNOSIS — I10 Essential (primary) hypertension: Secondary | ICD-10-CM | POA: Diagnosis not present

## 2020-12-05 DIAGNOSIS — K449 Diaphragmatic hernia without obstruction or gangrene: Secondary | ICD-10-CM | POA: Diagnosis not present

## 2020-12-05 DIAGNOSIS — K219 Gastro-esophageal reflux disease without esophagitis: Secondary | ICD-10-CM | POA: Diagnosis present

## 2020-12-05 DIAGNOSIS — E785 Hyperlipidemia, unspecified: Secondary | ICD-10-CM | POA: Diagnosis not present

## 2020-12-05 DIAGNOSIS — Z8249 Family history of ischemic heart disease and other diseases of the circulatory system: Secondary | ICD-10-CM | POA: Diagnosis not present

## 2020-12-05 DIAGNOSIS — Z888 Allergy status to other drugs, medicaments and biological substances status: Secondary | ICD-10-CM | POA: Diagnosis not present

## 2020-12-05 DIAGNOSIS — Z7951 Long term (current) use of inhaled steroids: Secondary | ICD-10-CM | POA: Diagnosis not present

## 2020-12-05 DIAGNOSIS — Z791 Long term (current) use of non-steroidal anti-inflammatories (NSAID): Secondary | ICD-10-CM | POA: Diagnosis not present

## 2020-12-05 DIAGNOSIS — D1779 Benign lipomatous neoplasm of other sites: Secondary | ICD-10-CM | POA: Diagnosis not present

## 2020-12-05 DIAGNOSIS — Z83438 Family history of other disorder of lipoprotein metabolism and other lipidemia: Secondary | ICD-10-CM | POA: Diagnosis not present

## 2020-12-05 DIAGNOSIS — Z20822 Contact with and (suspected) exposure to covid-19: Secondary | ICD-10-CM | POA: Diagnosis present

## 2020-12-05 DIAGNOSIS — J301 Allergic rhinitis due to pollen: Secondary | ICD-10-CM | POA: Diagnosis not present

## 2020-12-05 DIAGNOSIS — Z79899 Other long term (current) drug therapy: Secondary | ICD-10-CM

## 2020-12-05 HISTORY — DX: Diaphragmatic hernia without obstruction or gangrene: K44.9

## 2020-12-05 HISTORY — PX: ESOPHAGOGASTRODUODENOSCOPY: SHX5428

## 2020-12-05 HISTORY — PX: XI ROBOTIC ASSISTED PARAESOPHAGEAL HERNIA REPAIR: SHX6871

## 2020-12-05 LAB — ABO/RH: ABO/RH(D): O POS

## 2020-12-05 LAB — GLUCOSE, CAPILLARY: Glucose-Capillary: 151 mg/dL — ABNORMAL HIGH (ref 70–99)

## 2020-12-05 SURGERY — REPAIR, HERNIA, PARAESOPHAGEAL, ROBOT-ASSISTED
Anesthesia: General | Site: Chest

## 2020-12-05 MED ORDER — OXYCODONE HCL 5 MG PO TABS: 5.0000 mg | ORAL_TABLET | Freq: Once | ORAL | Status: DC | PRN

## 2020-12-05 MED ORDER — ACETAMINOPHEN 160 MG/5ML PO SOLN: 1000.0000 mg | Freq: Once | ORAL | Status: DC | PRN

## 2020-12-05 MED ORDER — ACETAMINOPHEN 10 MG/ML IV SOLN: 1000.0000 mg | Freq: Once | INTRAVENOUS | Status: DC | PRN

## 2020-12-05 MED ORDER — OXYCODONE HCL 5 MG/5ML PO SOLN
5.0000 mg | Freq: Once | ORAL | Status: DC | PRN
Start: 2020-12-05 — End: 2020-12-05

## 2020-12-05 MED ORDER — KETOROLAC TROMETHAMINE 30 MG/ML IJ SOLN
30.0000 mg | Freq: Four times a day (QID) | INTRAMUSCULAR | Status: DC
Start: 2020-12-05 — End: 2020-12-07
  Administered 2020-12-05 – 2020-12-07 (×7): 30 mg via INTRAVENOUS
  Filled 2020-12-05 (×7): qty 1

## 2020-12-05 MED ORDER — LIDOCAINE 2% (20 MG/ML) 5 ML SYRINGE
INTRAMUSCULAR | Status: AC
  Filled 2020-12-05: qty 10

## 2020-12-05 MED ORDER — ONDANSETRON HCL 4 MG/2ML IJ SOLN
INTRAMUSCULAR | Status: AC
  Filled 2020-12-05: qty 2

## 2020-12-05 MED ORDER — 0.9 % SODIUM CHLORIDE (POUR BTL) OPTIME
TOPICAL | Status: DC | PRN
  Administered 2020-12-05: 2000 mL

## 2020-12-05 MED ORDER — FENTANYL CITRATE (PF) 250 MCG/5ML IJ SOLN
INTRAMUSCULAR | Status: AC
  Filled 2020-12-05: qty 5

## 2020-12-05 MED ORDER — PHENYLEPHRINE 40 MCG/ML (10ML) SYRINGE FOR IV PUSH (FOR BLOOD PRESSURE SUPPORT)
PREFILLED_SYRINGE | INTRAVENOUS | Status: AC
  Filled 2020-12-05: qty 20

## 2020-12-05 MED ORDER — ENOXAPARIN SODIUM 40 MG/0.4ML IJ SOSY
40.0000 mg | PREFILLED_SYRINGE | Freq: Every day | INTRAMUSCULAR | Status: DC
  Administered 2020-12-06 – 2020-12-07 (×2): 40 mg via SUBCUTANEOUS
  Filled 2020-12-05 (×2): qty 0.4

## 2020-12-05 MED ORDER — ACETAMINOPHEN 650 MG RE SUPP
650.0000 mg | RECTAL | Status: DC | PRN
Start: 2020-12-05 — End: 2020-12-07

## 2020-12-05 MED ORDER — DEXAMETHASONE SODIUM PHOSPHATE 10 MG/ML IJ SOLN
INTRAMUSCULAR | Status: DC | PRN
  Administered 2020-12-05: 4 mg via INTRAVENOUS

## 2020-12-05 MED ORDER — PROPOFOL 10 MG/ML IV BOLUS
INTRAVENOUS | Status: AC
  Filled 2020-12-05: qty 20

## 2020-12-05 MED ORDER — ROCURONIUM BROMIDE 10 MG/ML (PF) SYRINGE
PREFILLED_SYRINGE | INTRAVENOUS | Status: DC | PRN
  Administered 2020-12-05: 20 mg via INTRAVENOUS
  Administered 2020-12-05: 10 mg via INTRAVENOUS
  Administered 2020-12-05: 80 mg via INTRAVENOUS
  Administered 2020-12-05: 20 mg via INTRAVENOUS

## 2020-12-05 MED ORDER — MOMETASONE FURO-FORMOTEROL FUM 200-5 MCG/ACT IN AERO
2.0000 | INHALATION_SPRAY | Freq: Two times a day (BID) | RESPIRATORY_TRACT | Status: DC
  Administered 2020-12-05 – 2020-12-07 (×4): 2 via RESPIRATORY_TRACT
  Filled 2020-12-05: qty 8.8

## 2020-12-05 MED ORDER — KETAMINE HCL 10 MG/ML IJ SOLN
INTRAMUSCULAR | Status: DC | PRN
  Administered 2020-12-05: 30 mg via INTRAVENOUS

## 2020-12-05 MED ORDER — LIDOCAINE 2% (20 MG/ML) 5 ML SYRINGE
INTRAMUSCULAR | Status: DC | PRN
  Administered 2020-12-05: 60 mg via INTRAVENOUS

## 2020-12-05 MED ORDER — ACETAMINOPHEN 10 MG/ML IV SOLN
INTRAVENOUS | Status: DC | PRN
  Administered 2020-12-05: 1000 mg via INTRAVENOUS

## 2020-12-05 MED ORDER — VANCOMYCIN HCL IN DEXTROSE 1-5 GM/200ML-% IV SOLN
1000.0000 mg | Freq: Two times a day (BID) | INTRAVENOUS | Status: AC
Start: 2020-12-05 — End: 2020-12-05
  Administered 2020-12-05: 1000 mg via INTRAVENOUS
  Filled 2020-12-05: qty 200

## 2020-12-05 MED ORDER — VANCOMYCIN HCL IN DEXTROSE 1-5 GM/200ML-% IV SOLN
1000.0000 mg | INTRAVENOUS | Status: AC
  Administered 2020-12-05: 1000 mg via INTRAVENOUS
  Filled 2020-12-05: qty 200

## 2020-12-05 MED ORDER — ONDANSETRON HCL 4 MG/2ML IJ SOLN
4.0000 mg | Freq: Four times a day (QID) | INTRAMUSCULAR | Status: DC
  Administered 2020-12-06 – 2020-12-07 (×6): 4 mg via INTRAVENOUS
  Filled 2020-12-05 (×6): qty 2

## 2020-12-05 MED ORDER — CHLORHEXIDINE GLUCONATE 0.12 % MT SOLN
15.0000 mL | Freq: Once | OROMUCOSAL | Status: AC
  Administered 2020-12-05: 15 mL via OROMUCOSAL
  Filled 2020-12-05: qty 15

## 2020-12-05 MED ORDER — ROCURONIUM BROMIDE 10 MG/ML (PF) SYRINGE
PREFILLED_SYRINGE | INTRAVENOUS | Status: AC
  Filled 2020-12-05: qty 20

## 2020-12-05 MED ORDER — KETAMINE HCL 50 MG/5ML IJ SOSY
PREFILLED_SYRINGE | INTRAMUSCULAR | Status: AC
  Filled 2020-12-05: qty 5

## 2020-12-05 MED ORDER — BUPIVACAINE HCL (PF) 0.5 % IJ SOLN
INTRAMUSCULAR | Status: AC
  Filled 2020-12-05: qty 30

## 2020-12-05 MED ORDER — FENTANYL CITRATE (PF) 100 MCG/2ML IJ SOLN
INTRAMUSCULAR | Status: DC | PRN
  Administered 2020-12-05: 100 ug via INTRAVENOUS
  Administered 2020-12-05 (×2): 50 ug via INTRAVENOUS

## 2020-12-05 MED ORDER — LACTATED RINGERS IV SOLN: INTRAVENOUS | Status: DC

## 2020-12-05 MED ORDER — ACETAMINOPHEN 10 MG/ML IV SOLN
INTRAVENOUS | Status: AC
  Filled 2020-12-05: qty 100

## 2020-12-05 MED ORDER — ACETAMINOPHEN 500 MG PO TABS: 1000.0000 mg | ORAL_TABLET | Freq: Once | ORAL | Status: DC | PRN

## 2020-12-05 MED ORDER — BUPIVACAINE LIPOSOME 1.3 % IJ SUSP
INTRAMUSCULAR | Status: AC
  Filled 2020-12-05: qty 20

## 2020-12-05 MED ORDER — INSULIN ASPART 100 UNIT/ML IJ SOLN
0.0000 [IU] | Freq: Four times a day (QID) | INTRAMUSCULAR | Status: DC
  Administered 2020-12-06 (×2): 2 [IU] via SUBCUTANEOUS

## 2020-12-05 MED ORDER — MORPHINE SULFATE (PF) 2 MG/ML IV SOLN
1.0000 mg | INTRAVENOUS | Status: DC | PRN
  Administered 2020-12-05 – 2020-12-06 (×4): 2 mg via INTRAVENOUS
  Filled 2020-12-05 (×4): qty 1

## 2020-12-05 MED ORDER — DEXMEDETOMIDINE (PRECEDEX) IN NS 20 MCG/5ML (4 MCG/ML) IV SYRINGE
PREFILLED_SYRINGE | INTRAVENOUS | Status: DC | PRN
  Administered 2020-12-05: 4 ug via INTRAVENOUS
  Administered 2020-12-05: 8 ug via INTRAVENOUS
  Administered 2020-12-05: 4 ug via INTRAVENOUS

## 2020-12-05 MED ORDER — ONDANSETRON HCL 4 MG/2ML IJ SOLN
INTRAMUSCULAR | Status: DC | PRN
  Administered 2020-12-05: 4 mg via INTRAVENOUS

## 2020-12-05 MED ORDER — ORAL CARE MOUTH RINSE
15.0000 mL | Freq: Once | OROMUCOSAL | Status: AC
Start: 2020-12-05 — End: 2020-12-05

## 2020-12-05 MED ORDER — BUPIVACAINE LIPOSOME 1.3 % IJ SUSP: INTRAMUSCULAR | Status: DC | PRN

## 2020-12-05 MED ORDER — FENTANYL CITRATE (PF) 100 MCG/2ML IJ SOLN: 25.0000 ug | INTRAMUSCULAR | Status: DC | PRN

## 2020-12-05 MED ORDER — PROPOFOL 10 MG/ML IV BOLUS
INTRAVENOUS | Status: DC | PRN
  Administered 2020-12-05: 160 mg via INTRAVENOUS

## 2020-12-05 MED ORDER — LABETALOL HCL 5 MG/ML IV SOLN
INTRAVENOUS | Status: DC | PRN
  Administered 2020-12-05: 2.5 mg via INTRAVENOUS
  Administered 2020-12-05: 1 mg via INTRAVENOUS
  Administered 2020-12-05 (×2): 2.5 mg via INTRAVENOUS

## 2020-12-05 MED ORDER — FENTANYL CITRATE (PF) 100 MCG/2ML IJ SOLN
INTRAMUSCULAR | Status: AC
  Administered 2020-12-05: 25 ug
  Filled 2020-12-05: qty 2

## 2020-12-05 MED ORDER — LACTATED RINGERS IV SOLN: INTRAVENOUS | Status: DC | PRN

## 2020-12-05 MED ORDER — SUGAMMADEX SODIUM 200 MG/2ML IV SOLN
INTRAVENOUS | Status: DC | PRN
Start: 2020-12-05 — End: 2020-12-05
  Administered 2020-12-05: 200 mg via INTRAVENOUS

## 2020-12-05 MED ORDER — EPHEDRINE 5 MG/ML INJ
INTRAVENOUS | Status: AC
  Filled 2020-12-05: qty 5

## 2020-12-05 SURGICAL SUPPLY — 88 items
BLADE CLIPPER SURG (BLADE) ×3 IMPLANT
BLADE SURG 11 STRL SS (BLADE) ×3 IMPLANT
BUTTON OLYMPUS DEFENDO 5 PIECE (MISCELLANEOUS) ×3 IMPLANT
CANISTER SUCT 3000ML PPV (MISCELLANEOUS) ×6 IMPLANT
CANNULA REDUC XI 12-8 STAPL (CANNULA)
CANNULA REDUCER 12-8 DVNC XI (CANNULA) IMPLANT
CATH THORACIC 28FR (CATHETERS) IMPLANT
CNTNR URN SCR LID CUP LEK RST (MISCELLANEOUS) ×4 IMPLANT
CONT SPEC 4OZ STRL OR WHT (MISCELLANEOUS) ×2
COVER BACK TABLE 60X90IN (DRAPES) IMPLANT
DECANTER SPIKE VIAL GLASS SM (MISCELLANEOUS) ×3 IMPLANT
DEFOGGER SCOPE WARMER CLEARIFY (MISCELLANEOUS) ×3 IMPLANT
DERMABOND ADVANCED (GAUZE/BANDAGES/DRESSINGS) ×1
DERMABOND ADVANCED .7 DNX12 (GAUZE/BANDAGES/DRESSINGS) ×2 IMPLANT
DEVICE SUTURE ENDOST 10MM (ENDOMECHANICALS) IMPLANT
DRAIN PENROSE 1/2X12 LTX STRL (WOUND CARE) IMPLANT
DRAPE ARM DVNC X/XI (DISPOSABLE) ×8 IMPLANT
DRAPE COLUMN DVNC XI (DISPOSABLE) ×2 IMPLANT
DRAPE CV SPLIT W-CLR ANES SCRN (DRAPES) ×3 IMPLANT
DRAPE DA VINCI XI ARM (DISPOSABLE) ×4
DRAPE DA VINCI XI COLUMN (DISPOSABLE) ×1
DRAPE HALF SHEET 40X57 (DRAPES) ×3 IMPLANT
DRAPE INCISE IOBAN 66X45 STRL (DRAPES) IMPLANT
DRAPE ORTHO SPLIT 77X108 STRL (DRAPES) ×1
DRAPE SURG ORHT 6 SPLT 77X108 (DRAPES) ×2 IMPLANT
ELECT REM PT RETURN 9FT ADLT (ELECTROSURGICAL) ×3
ELECTRODE REM PT RTRN 9FT ADLT (ELECTROSURGICAL) ×2 IMPLANT
FELT TEFLON 1X6 (MISCELLANEOUS) IMPLANT
FORCEPS GRASP COMBO 8X230 (FORCEP) IMPLANT
GAUZE SPONGE 4X4 12PLY STRL (GAUZE/BANDAGES/DRESSINGS) ×3 IMPLANT
GLOVE SURG ENC MOIS LTX SZ7 (GLOVE) ×3 IMPLANT
GLOVE SURG ENC MOIS LTX SZ7.5 (GLOVE) ×9 IMPLANT
GOWN STRL REUS W/ TWL LRG LVL3 (GOWN DISPOSABLE) ×6 IMPLANT
GOWN STRL REUS W/ TWL XL LVL3 (GOWN DISPOSABLE) ×6 IMPLANT
GOWN STRL REUS W/TWL 2XL LVL3 (GOWN DISPOSABLE) ×3 IMPLANT
GOWN STRL REUS W/TWL LRG LVL3 (GOWN DISPOSABLE) ×3
GOWN STRL REUS W/TWL XL LVL3 (GOWN DISPOSABLE) ×3
GRAFT MYRIAD 3 LAYER 5X5 (Graft) ×3 IMPLANT
GRASPER SUT TROCAR 14GX15 (MISCELLANEOUS) IMPLANT
HEMOSTAT SURGICEL 2X14 (HEMOSTASIS) IMPLANT
IV NS 1000ML (IV SOLUTION)
IV NS 1000ML BAXH (IV SOLUTION) IMPLANT
KIT BASIN OR (CUSTOM PROCEDURE TRAY) ×3 IMPLANT
KIT TURNOVER KIT B (KITS) ×3 IMPLANT
MARKER SKIN DUAL TIP RULER LAB (MISCELLANEOUS) IMPLANT
NEEDLE 18GX1X1/2 (RX/OR ONLY) (NEEDLE) IMPLANT
NEEDLE HYPO 22GX1.5 SAFETY (NEEDLE) ×3 IMPLANT
NEEDLE SCLEROTHERAPY 23X2X240 (NEEDLE) IMPLANT
NS IRRIG 1000ML POUR BTL (IV SOLUTION) ×6 IMPLANT
OIL SILICONE PENTAX (PARTS (SERVICE/REPAIRS)) IMPLANT
PACK CHEST (CUSTOM PROCEDURE TRAY) ×3 IMPLANT
PACK UNIVERSAL I (CUSTOM PROCEDURE TRAY) ×3 IMPLANT
PAD ARMBOARD 7.5X6 YLW CONV (MISCELLANEOUS) ×6 IMPLANT
PORT ACCESS TROCAR AIRSEAL 12 (TROCAR) IMPLANT
PORT ACCESS TROCAR AIRSEAL 5M (TROCAR)
SEAL CANN UNIV 5-8 DVNC XI (MISCELLANEOUS) ×8 IMPLANT
SEAL XI 5MM-8MM UNIVERSAL (MISCELLANEOUS) ×4
SEALER SYNCHRO 8 IS4000 DV (MISCELLANEOUS) ×1
SEALER SYNCHRO 8 IS4000 DVNC (MISCELLANEOUS) ×2 IMPLANT
SET TRI-LUMEN FLTR TB AIRSEAL (TUBING) ×3 IMPLANT
SHEET MEDIUM DRAPE 40X70 STRL (DRAPES) IMPLANT
STAPLER CANNULA SEAL DVNC XI (STAPLE) IMPLANT
STAPLER CANNULA SEAL XI (STAPLE)
SUT ETHIBOND 0 36 GRN (SUTURE) ×3 IMPLANT
SUT SILK  1 MH (SUTURE) ×1
SUT SILK 1 MH (SUTURE) ×2 IMPLANT
SUT SURGIDAC NAB ES-9 0 48 120 (SUTURE) IMPLANT
SUT VIC AB 3-0 SH 27 (SUTURE) ×2
SUT VIC AB 3-0 SH 27X BRD (SUTURE) ×4 IMPLANT
SUT VIC AB 3-0 X1 27 (SUTURE) ×6 IMPLANT
SUT VICRYL 0 UR6 27IN ABS (SUTURE) ×6 IMPLANT
SYR 10ML LL (SYRINGE) IMPLANT
SYR 20CC LL (SYRINGE) ×3 IMPLANT
SYR 20ML ECCENTRIC (SYRINGE) IMPLANT
SYR 30ML SLIP (SYRINGE) IMPLANT
SYSTEM SAHARA CHEST DRAIN ATS (WOUND CARE) ×3 IMPLANT
TAPE CLOTH SURG 4X10 WHT LF (GAUZE/BANDAGES/DRESSINGS) ×3 IMPLANT
TOWEL GREEN STERILE (TOWEL DISPOSABLE) ×3 IMPLANT
TOWEL GREEN STERILE FF (TOWEL DISPOSABLE) ×3 IMPLANT
TRAY FOLEY MTR SLVR 16FR STAT (SET/KITS/TRAYS/PACK) ×3 IMPLANT
TRAY WAYNE PNEUMOTHORAX 14X18 (TRAY / TRAY PROCEDURE) ×3 IMPLANT
TROCAR PORT AIRSEAL 8X120 (TROCAR) ×3 IMPLANT
TROCAR XCEL BLADELESS 5X75MML (TROCAR) ×3 IMPLANT
TROCAR XCEL NON-BLD 5MMX100MML (ENDOMECHANICALS) IMPLANT
TUBE CONNECTING 20X1/4 (TUBING) ×3 IMPLANT
TUBING ENDO SMARTCAP (MISCELLANEOUS) ×3 IMPLANT
UNDERPAD 30X36 HEAVY ABSORB (UNDERPADS AND DIAPERS) ×3 IMPLANT
WATER STERILE IRR 1000ML POUR (IV SOLUTION) ×3 IMPLANT

## 2020-12-05 NOTE — Progress Notes (Addendum)
After IV was started patient became diaphoretic and verbalized that he is feeling dizzy. BP decreased from 165/97 (Pulse 83) when patient arrived in short stay to 92/54 (Pulse 44). Patient was placed in trendelenburg position for few minutes and BP increased to 152/95 (Pulse 61). Patient verbalized that he is feeling better. Dr. Ermalene Postin was notified.

## 2020-12-05 NOTE — Discharge Summary (Signed)
Physician Discharge Summary       [address]       [place] [address]             [phone]    Patient ID: Joe Arroyo MRN: [id number] DOB/AGE: [birth year] 65 y.o.  Admit date: 12/05/2020 Discharge date: 12/07/2020  Admission Diagnoses: Paraesophageal hernia  Discharge Diagnoses:  S/p robotic assisted repair of para esophageal hernia with mesh and fundoplication History of hyperlipidemia History of hypertension History of kidney stones History of sinus bradycardia    Consults: None  Procedure (s):  Esophagogastroduodenoscopy, XI robotic assisted para esophageal hernia repair with mesh and fundoplication by Dr. Kipp Brood on 12/05/2020.  History of Presenting Illness: This is a 65 year old male who was initially seen by Dr. Kipp Brood in the office for evaluation of a moderate to large para esophageal hernia. About 8 months ago, the patient developed worsening shortness of breath.  He is undergone extensive evaluation with nothing abnormal except the fact that he has this hiatal hernia.  He does endorse significant reflux but denies any dysphagia.  His weight has been stable. Dr. Kipp Brood thinks that fixing his hiatal hernia and removing this as a potential source for shortness of breath will be helpful.  It is also possible that he may be silently aspirating, and may have a pneumonitis from this hernia.  Potential risks, benefits, and complications of the surgery were discussed and he is agreeable to proceed.  Brief Hospital Course:   The patient presented to [facility] on 12/05/2020.  He underwent Robotic Assisted repair of paraesophageal hernia with fundoplication. Patient was extubated following surgery. He was transferred in stable condition from the OR to PACU. Foley removed on post operative day one. Chest tube was to water seal. There was no air leak. CXR showed low lung volumes, left basilar atelectasis. Esophagram showed no evidence of leak, no  hiatal hernia, and narrowing at the GE junction (reflecting fundoplication and post op edema). He was hypertensive post op and Clonidine and Amlodipine were restarted.  The patient is tolerating a dysphagia diet.  He has not experienced nausea or vomiting.  He has moved his bowels.  He is ambulating.  He is felt medically stable for discharge home today.  Latest Vital Signs: Blood pressure (!) 140/98, pulse 68, temperature 98.2 F (36.8 C), temperature source Oral, resp. rate 15, height 6' (1.829 m), weight 88.5 kg, SpO2 96 %.  Physical Exam: Cardiovascular: RRR Pulmonary: Clear to auscultation bilaterally Abdomen: Soft, non tender, bowel sounds present. Extremities: SCDs in place Wounds: Clean and dry.  No erythema or signs of infection.  Discharge Condition:Stable and discharged to home.  Recent laboratory studies:  Lab Results  Component Value Date   WBC 9.0 12/07/2020   HGB 13.2 12/07/2020   HCT 40.7 12/07/2020   MCV 87.2 12/07/2020   PLT 216 12/07/2020   Lab Results  Component Value Date   NA 136 12/07/2020   K 3.4 (L) 12/07/2020   CL 105 12/07/2020   CO2 23 12/07/2020   CREATININE 1.28 (H) 12/07/2020   GLUCOSE 140 (H) 12/07/2020    Diagnostic Studies: DG Chest 2 View  Result Date: 12/04/2020 CLINICAL DATA:  65 year old male with preoperative chest x-ray EXAM: CHEST - 2 VIEW COMPARISON:  04/21/2020 FINDINGS: Cardiomediastinal silhouette unchanged in size and contour. No evidence of central vascular congestion. No interlobular septal thickening. Double density projecting over the lower mediastinum No pneumothorax or pleural effusion. Coarsened interstitial markings, with no  confluent airspace disease. No acute displaced fracture. Degenerative changes of the spine. IMPRESSION: Negative for acute cardiopulmonary disease. Hiatal hernia Electronically Signed   By: Corrie Mckusick D.O.   On: 12/04/2020 09:48   DG CHEST PORT 1 VIEW  Result Date: 12/06/2020 CLINICAL DATA:  Chest  tube present EXAM: PORTABLE CHEST 1 VIEW COMPARISON:  Chest radiograph 1 day prior FINDINGS: A pigtail catheter in the left hemithorax is again seen. The heart is enlarged, unchanged. The mediastinal contours are stable. Lung volumes are low. Reticular opacities in the lower lobes bilaterally likely reflect platelike subsegmental atelectasis. The upper lungs are well aerated. There is vascular congestion without frank edema. There is no appreciable pneumothorax. IMPRESSION: Left-sided pigtail catheter coiled in the left hemithorax. No appreciable pneumothorax. Electronically Signed   By: Valetta Mole M.D.   On: 12/06/2020 09:36   DG Chest Port 1 View  Result Date: 12/05/2020 CLINICAL DATA:  Pneumothorax. Robotic assisted paraesophageal hernia repair and fundoplication. EXAM: PORTABLE CHEST 1 VIEW COMPARISON:  Preoperative radiograph 12/03/2020 FINDINGS: Left-sided pigtail catheter is coiled in the region of the mid hemithorax. No visualized pneumothorax. Overall low lung volumes. The previous retrocardiac hiatal hernia is not definitively seen. No significant pleural effusion. No confluent consolidation. Minor atelectasis at the left lung base. IMPRESSION: 1. Left-sided pigtail catheter is coiled in the mid hemithorax. No visualized pneumothorax. 2. Low lung volumes with left basilar atelectasis. Electronically Signed   By: Keith Rake M.D.   On: 12/05/2020 18:09   DG ESOPHAGUS W SINGLE CM (SOL OR THIN BA)  Result Date: 12/06/2020 CLINICAL DATA:  Post paraesophageal hernia repair EXAM: ESOPHOGRAM/BARIUM SWALLOW TECHNIQUE: Single contrast examination was performed using water soluble. FLUOROSCOPY TIME:  Fluoroscopy Time:  36 seconds Radiation Exposure Index (if provided by the fluoroscopic device): 7.3 mGy COMPARISON:  None. FINDINGS: Patient swallowed contrast without difficulty. There is no extravasation to suggest leak. There is no hiatal hernia. Narrowing is present at the gastroesophageal junction  reflecting fundoplication and postoperative edema. This results in some holdup of contrast. No apparent reflux. IMPRESSION: No evidence of leak. No hiatal hernia. Narrowing at the gastroesophageal junction reflecting fundoplication and postoperative edema with some holdup of contrast. Electronically Signed   By: Macy Mis M.D.   On: 12/06/2020 08:36     Discharge Medications: Allergies as of 12/07/2020       Reactions   Bystolic [nebivolol Hcl] Other (See Comments)   bradycardia   Carvedilol Other (See Comments)   bradycardia   Pravastatin Other (See Comments)   "made my joints hurt"   Hctz [hydrochlorothiazide] Rash   Hydralazine Rash   Penicillins Rash        Medication List     STOP taking these medications    meloxicam 15 MG tablet Commonly known as: MOBIC       TAKE these medications    Advair HFA 230-21 MCG/ACT inhaler Generic drug: fluticasone-salmeterol Inhale 2 puffs into the lungs 2 (two) times daily.   amLODipine 5 MG tablet Commonly known as: NORVASC Take 1 tablet (5 mg total) by mouth daily.   CENTRUM SILVER ULTRA MENS PO Take 1 tablet by mouth daily.   cloNIDine 0.3 MG tablet Commonly known as: CATAPRES Take 1 tablet (0.3 mg total) by mouth daily.   esomeprazole 20 MG capsule Commonly known as: NEXIUM Take by mouth daily at 12 noon. OTC   ezetimibe 10 MG tablet Commonly known as: ZETIA Take 1 tablet (10 mg total) by mouth daily.  finasteride 5 MG tablet Commonly known as: PROSCAR Take 5 mg by mouth daily.   FISH OIL PO Take 1 tablet by mouth 2 (two) times daily.   HYDROcodone-acetaminophen 7.5-325 mg/15 ml solution Commonly known as: HYCET Take 15 mLs by mouth 4 (four) times daily as needed for moderate pain.   ondansetron 8 MG disintegrating tablet Commonly known as: Zofran ODT Take 1 tablet (8 mg total) by mouth every 8 (eight) hours as needed for nausea or vomiting.   rosuvastatin 10 MG tablet Commonly known as:  Crestor Take 1 tablet (10 mg total) by mouth at bedtime.        Follow Up Appointments:  Follow-up Information     Lajuana Matte, MD Follow up on 12/14/2020.   Specialty: Cardiothoracic Surgery Why: Appointment is VIRTUAL so please do NOT go to the office. Dr. Kipp Brood will call you at 3:20 pm Contact information: [address] [place] [address] [phone]                 Signed: Ellamae Sia 12/07/2020, 8:02 AM

## 2020-12-05 NOTE — Brief Op Note (Signed)
12/05/2020  3:18 PM  PATIENT:  Joe Arroyo  65 y.o. male  PRE-OPERATIVE DIAGNOSIS:  PARAESOPHAGEAL HERNIA  POST-OPERATIVE DIAGNOSIS:  PARAESOPHAGEAL HERNIA  PROCEDURE:  ESOPHAGOGASTRODUODENOSCOPY (EGD), XI ROBOTIC ASSISTED ESOPHAGEAL HERNIA REPAIR AND FUNDOPLICATION   SURGEON:  Surgeon(s) and Role:    Lightfoot, Lucile Crater, MD - Primary  PHYSICIAN ASSISTANT: Lars Pinks PA-C  ANESTHESIA:   general  EBL:  Per anesthesia record  BLOOD ADMINISTERED:none  DRAINS:  Pigtail chest tube placed in the left pleural space    LOCAL MEDICATIONS USED:  OTHER Exparel  COUNTS CORRECT:  YES  DICTATION: .Dragon Dictation  PLAN OF CARE: Admit to inpatient   PATIENT DISPOSITION:  PACU - hemodynamically stable.   Delay start of Pharmacological VTE agent (>24hrs) due to surgical blood loss or risk of bleeding: yes

## 2020-12-05 NOTE — Transfer of Care (Signed)
Immediate Anesthesia Transfer of Care Note  Patient: Joe Arroyo  Procedure(s) Performed: XI ROBOTIC ASSISTED PARAESOPHAGEAL HERNIA REPAIR AND FUNDOPLICATION (Chest) ESOPHAGOGASTRODUODENOSCOPY (EGD)  Patient Location: PACU  Anesthesia Type:General  Level of Consciousness: drowsy and pateint uncooperative  Airway & Oxygen Therapy: Patient Spontanous Breathing  Post-op Assessment: Report given to RN and Post -op Vital signs reviewed and stable  Post vital signs: Reviewed and stable  Last Vitals:  Vitals Value Taken Time  BP 144/99 12/05/20 1532  Temp    Pulse 59 12/05/20 1538  Resp 21 12/05/20 1538  SpO2 92 % 12/05/20 1538  Vitals shown include unvalidated device data.  Last Pain:  Vitals:   12/05/20 0958  TempSrc:   PainSc: 0-No pain         Complications: No notable events documented.

## 2020-12-05 NOTE — Hospital Course (Addendum)
HPI: This is a 65 year old male who was initially seen by Dr. Kipp Brood in the office for evaluation of a moderate to large para esophageal hernia. About 8 months ago, the patient developed worsening shortness of breath.  He is undergone extensive evaluation with nothing abnormal except the fact that he has this hiatal hernia.  He does endorse significant reflux but denies any dysphagia.  His weight has been stable. Dr. Kipp Brood thinks that fixing his hiatal hernia and removing this as a potential source for shortness of breath will be helpful.  It is also possible that he may be silently aspirating, and may have a pneumonitis from this hernia.  Potential risks, benefits, and complications of the surgery were discussed and he is agreeable to proceed.  Hospital Course: Patient was extubated following surgery. He was transferred in stable condition from the OR to PACU. Foley removed on post operative day one. Chest tube was to water seal. There was no air leak. CXR showed low lung volumes, left basilar atelectasis. Esophagram showed no evidence of leak, no hiatal hernia, and narrowing at the GE junction (reflecting fundoplication and post op edema). He was hypertensive post op and Clonidine and Amlodipine were restarted.  The patient is tolerating a dysphagia diet.  He has not experienced nausea or vomiting.  He has moved his bowels.  He is ambulating.  He is felt medically stable for discharge home today.

## 2020-12-05 NOTE — Anesthesia Procedure Notes (Signed)
Procedure Name: Intubation Date/Time: 12/05/2020 12:18 PM Performed by: Oletta Lamas, CRNA Pre-anesthesia Checklist: Patient identified, Emergency Drugs available, Suction available and Patient being monitored Patient Re-evaluated:Patient Re-evaluated prior to induction Oxygen Delivery Method: Circle System Utilized Preoxygenation: Pre-oxygenation with 100% oxygen Induction Type: IV induction Ventilation: Mask ventilation without difficulty Laryngoscope Size: Miller and 3 Grade View: Grade I Tube type: Oral Number of attempts: 1 Airway Equipment and Method: Stylet Placement Confirmation: ETT inserted through vocal cords under direct vision, positive ETCO2 and breath sounds checked- equal and bilateral Secured at: 23 cm Tube secured with: Tape Dental Injury: Injury to lip  Comments: 1 mm laceration to bottom left lip from bag mask ventilation.

## 2020-12-05 NOTE — Discharge Instructions (Signed)
ACTIVITY:  1.Increase activity slowly. 2.Walk daily and increase frequency and duration as tolerates. 3.May walk up steps. 4.No lifting more than ten pounds for two weeks. 5.No driving for two weeks. 6.Avoid straining. 7.STOP any activity that causes chest pain, shortness of breath, dizziness,sweating,     or excessive weakness. 8.Continue with breathing exercises daily.  DIET:  Dysphagia I soft diet. Please avoid carbonated beverages   WOUND:  1.May shower. 2.Clean wounds with mild soap and water.  Call the office at [phone] if any problems arise.

## 2020-12-05 NOTE — Interval H&P Note (Signed)
History and Physical Interval Note:  12/05/2020 11:28 AM  [name]  has presented today for surgery, with the diagnosis of PEH.  The various methods of treatment have been discussed with the patient and family. After consideration of risks, benefits and other options for treatment, the patient has consented to  Procedure(s): XI ROBOTIC ASSISTED PARAESOPHAGEAL HERNIA REPAIR AND FUNDOPLICATION (N/A) ESOPHAGOGASTRODUODENOSCOPY (EGD) (N/A) as a surgical intervention.  The patient's history has been reviewed, patient examined, no change in status, stable for surgery.  I have reviewed the patient's chart and labs.  Questions were answered to the patient's satisfaction.     Shamond Skelton Bary Leriche

## 2020-12-06 ENCOUNTER — Inpatient Hospital Stay (HOSPITAL_COMMUNITY): Payer: MEDICARE

## 2020-12-06 ENCOUNTER — Encounter (HOSPITAL_COMMUNITY): Payer: Self-pay | Admitting: Thoracic Surgery (Cardiothoracic Vascular Surgery)

## 2020-12-06 DIAGNOSIS — I517 Cardiomegaly: Secondary | ICD-10-CM | POA: Diagnosis not present

## 2020-12-06 DIAGNOSIS — K449 Diaphragmatic hernia without obstruction or gangrene: Secondary | ICD-10-CM | POA: Diagnosis not present

## 2020-12-06 DIAGNOSIS — K222 Esophageal obstruction: Secondary | ICD-10-CM | POA: Diagnosis not present

## 2020-12-06 LAB — BASIC METABOLIC PANEL
Anion gap: 8 (ref 5–15)
BUN: 12 mg/dL (ref 8–23)
CO2: 23 mmol/L (ref 22–32)
Calcium: 9.1 mg/dL (ref 8.9–10.3)
Chloride: 104 mmol/L (ref 98–111)
Creatinine, Ser: 1.25 mg/dL — ABNORMAL HIGH (ref 0.61–1.24)
GFR, Estimated: >60 mL/min (ref >60)
Glucose, Bld: 134 mg/dL — ABNORMAL HIGH (ref 70–99)
Potassium: 4.1 mmol/L (ref 3.5–5.1)
Sodium: 135 mmol/L (ref 135–145)

## 2020-12-06 LAB — CBC
HCT: 43.4 % (ref 39.0–52.0)
Hemoglobin: 14.2 g/dL (ref 13.0–17.0)
MCH: 28 pg (ref 26.0–34.0)
MCHC: 32.7 g/dL (ref 30.0–36.0)
MCV: 85.6 fL (ref 80.0–100.0)
Platelets: 237 10*3/uL (ref 150–400)
RBC: 5.07 MIL/uL (ref 4.22–5.81)
RDW: 14.6 % (ref 11.5–15.5)
WBC: 11.7 10*3/uL — ABNORMAL HIGH (ref 4.0–10.5)
nRBC: 0 % (ref 0.0–0.2)

## 2020-12-06 LAB — GLUCOSE, CAPILLARY
Glucose-Capillary: 102 mg/dL — ABNORMAL HIGH (ref 70–99)
Glucose-Capillary: 134 mg/dL — ABNORMAL HIGH (ref 70–99)

## 2020-12-06 MED ORDER — DIATRIZOATE MEGLUMINE & SODIUM 66-10 % PO SOLN
120.0000 mL | Freq: Once | ORAL | Status: AC
  Administered 2020-12-06: 120 mL via ORAL
  Filled 2020-12-06: qty 120

## 2020-12-06 MED ORDER — CLONIDINE HCL 0.3 MG PO TABS
0.3000 mg | ORAL_TABLET | Freq: Once | ORAL | Status: AC
  Administered 2020-12-06: 0.3 mg via ORAL
  Filled 2020-12-06: qty 3
  Filled 2020-12-06: qty 1

## 2020-12-06 NOTE — Progress Notes (Signed)
Bp- 170/100 , denied any symptoms. Catapres po given , continue to monitor.

## 2020-12-06 NOTE — Progress Notes (Signed)
Ambulated along the hallway on room air. Complained of feeling short of  breath  on and off  while ambulating. Continue to monitor.

## 2020-12-06 NOTE — Op Note (Signed)
[address]       [place] [address]             [phone]        12/06/2020  Patient:  [name] Pre-Op Dx: Paraesophageal hernia GERD Shortness of breath   Post-op Dx:  same Procedure: - Esophagoscopy - Robotic assisted laparoscopy - Paraesophageal hernia repair with 5 layer Myriad Mesh buttress - Dor Fundoplication   Surgeon and Role:      * Novie Maggio, Lucile Crater, MD - Primary    * Josie Saunders, PA-C - assisting  Anesthesia  general EBL:  22m Blood Administration: none Specimen:  none   Counts: correct   Indications: 65year old male referred for surgical evaluation of a mostly left-sided paraesophageal hernia.  His biggest complaint is his dyspnea despite undergoing extensive evaluation we have no other source for this problem.  I explained to him that this hernia has likely been present for several years and thus acutely developing shortness of breath 8 months ago may not be related.  I do think that fixing his hiatal hernia and removing this is a potential source for shortness of breath will be helpful.  It is also possible that he may be silently aspirating, and may have a pneumonitis from this hernia.  He is agreeable to proceed   Findings: Patulous esophagus.  Large hiatal hernia.  Large retroperitoneal lipoma.    Operative Technique: After the risks, benefits and alternatives were thoroughly discussed, the patient was brought to the operative theatre.  Anesthesia was induced, and the esophagoscope was passed through the oropharynx down to the stomach.  The scope was retroflexed and the hiatal hernia was clearly evident.  The scope was pulled back and the mucosal surface of the esophagus was visualized.    The esophagus was patulous.  The scope was then parked at 25 cm from the incisors.  The patient was then prepped and draped in normal sterile fashion.  An appropriate surgical pause was performed, and pre-operative antibiotics were  dosed accordingly.  We began with a 1 cm incision 15 cm caudad from the xiphoid and slightly lateral to the umbilicus.  Using an Optiview we entered the peritoneal space.  The abdomen was then insufflated with CO2.  3 other robotic ports were placed to triangulate the hiatus.  Another 12 mm port was placed in place at the level of the umbilicus laterally for an assistant port and another 5 mm trocar was placed in the right lower quadrant for liver retractor.  The patient was then placed in steep reverse Trendelenburg and the liver was elevated to expose the esophageal hiatus.  And then the robot was docked.  We began by dividing the gastrohepatic ligament to expose the right diaphragmatic crus and then dissected the hernia sac in a clockwise fashion to mobilize there the stomach and esophagus.  We then divided the short gastrics and moved towards the right crus and completed our dissection along the esophageal hiatus.  A Penrose drain was then used to encircle the the esophagus and we continued our dissection up into the mediastinum.  Once we had achieved 3 to 4 cm of intra-abdominal esophagus we then proceeded to reapproximate the crura with 0 Ethibond sutures in an interrupted fashion.  5 Layer Myriad mesh was used to buttress the crural repair.  The gastroscope was passed down through the lower esophageal sphincter into the stomach and would act as our bougie during this repair.  Next the stomach was passed anterior to the esophagus and a Dor fundoplication was performed.  An air leak test was performed using the gastroscope.  No leak was evident.  The liver retractor was removed and all ports were removed under direct visualization.  The skin and soft tissue were closed with absorbable suture    The patient tolerated the procedure without any immediate complications, and was transferred to the PACU in stable condition.  Hema Lanza Bary Leriche

## 2020-12-06 NOTE — Progress Notes (Signed)
Chest tube removed tolerated well,  2x2 gauze applied, site clean  and dry. Continue to monitor.

## 2020-12-06 NOTE — Progress Notes (Signed)
Complaining of shortness of breath after eating lunch. Pulse ox -94 % on room air. PA made aware. Continue to monitor.

## 2020-12-06 NOTE — Progress Notes (Addendum)
      [address]       [place] [address]             [phone]       1 Day Post-Op Procedure(s) (LRB): XI ROBOTIC ASSISTED PARAESOPHAGEAL HERNIA REPAIR AND FUNDOPLICATION (N/A) ESOPHAGOGASTRODUODENOSCOPY (EGD) (N/A)  Subjective: Patient in good spirits, alert and oriented this am.  Objective: Vital signs in last 24 hours: Temp:  [97.5 F (36.4 C)-98.8 F (37.1 C)] 98.5 F (36.9 C) (08/25 0300) Pulse Rate:  [55-94] 71 (08/25 0300) Cardiac Rhythm: Normal sinus rhythm (08/25 0400) Resp:  [8-22] 20 (08/25 0300) BP: (92-165)/(54-102) 142/87 (08/25 0300) SpO2:  [92 %-100 %] 92 % (08/25 0300) Weight:  [88.5 kg] 88.5 kg (08/24 0951)     Intake/Output from previous day: 08/24 0701 - 08/25 0700 In: 2000 [I.V.:1800; IV Piggyback:200] Out: [id number] [Urine:1300; Chest Tube:58]   Physical Exam:  Cardiovascular: RRR Pulmonary: Clear to auscultation bilaterally Abdomen: Soft, non tender, bowel sounds present. Extremities: SCDs in place Wounds: Clean and dry.  No erythema or signs of infection. Chest Tube: to water seal, no air leak  Lab Results: CBC: Recent Labs    12/03/20 1430 12/06/20 0035  WBC 8.1 11.7*  HGB 15.4 14.2  HCT 47.2 43.4  PLT 249 237   BMET:  Recent Labs    12/03/20 1430 12/06/20 0035  NA 140 135  K 3.9 4.1  CL 106 104  CO2 20* 23  GLUCOSE 121* 134*  BUN 12 12  CREATININE 1.26* 1.25*  CALCIUM 9.6 9.1    PT/INR:  Recent Labs    12/03/20 1430  LABPROT 13.2  INR 1.0   ABG:  INR: Will add last result for INR, ABG once components are confirmed Will add last 4 CBG results once components are confirmed  Assessment/Plan:  1. CV - SR. Will restart Amlodipine and Clonidine once able to take oral. 2.  Pulmonary - Chest tube with 58 cc of output since surgery. Chest tube is to water seal and there is no air leak. CXR this am appears stable (low lung volumes, no ptx). Encourage incentive spirometer 3. CBGs 151/102. No  history of DM. Will stop accu checks and SS PRN once give po 3. Creatinine stable at 1.25 (1.26 prior to admission). Continue scheduled Toradol. 4. GI-NPO. Await esophagram results 5. Possible discharge later today or in am  Sharalyn Ink [facility] 12/06/2020,7:00 AM [id number]   Swallow clear Advancing diet Home today if he tolerates it.  Brylyn Novakovich Bary Leriche

## 2020-12-06 NOTE — Progress Notes (Signed)
Transported by bed to radiology  dept for barium swallow.

## 2020-12-07 DIAGNOSIS — K449 Diaphragmatic hernia without obstruction or gangrene: Secondary | ICD-10-CM | POA: Diagnosis not present

## 2020-12-07 LAB — COMPREHENSIVE METABOLIC PANEL
ALT: 51 U/L — ABNORMAL HIGH (ref 0–44)
AST: 50 U/L — ABNORMAL HIGH (ref 15–41)
Albumin: 3.3 g/dL — ABNORMAL LOW (ref 3.5–5.0)
Alkaline Phosphatase: 81 U/L (ref 38–126)
Anion gap: 8 (ref 5–15)
BUN: 20 mg/dL (ref 8–23)
CO2: 23 mmol/L (ref 22–32)
Calcium: 8.7 mg/dL — ABNORMAL LOW (ref 8.9–10.3)
Chloride: 105 mmol/L (ref 98–111)
Creatinine, Ser: 1.28 mg/dL — ABNORMAL HIGH (ref 0.61–1.24)
GFR, Estimated: >60 mL/min (ref >60)
Glucose, Bld: 140 mg/dL — ABNORMAL HIGH (ref 70–99)
Potassium: 3.4 mmol/L — ABNORMAL LOW (ref 3.5–5.1)
Sodium: 136 mmol/L (ref 135–145)
Total Bilirubin: 0.8 mg/dL (ref 0.3–1.2)
Total Protein: 5.9 g/dL — ABNORMAL LOW (ref 6.5–8.1)

## 2020-12-07 LAB — CBC
HCT: 40.7 % (ref 39.0–52.0)
Hemoglobin: 13.2 g/dL (ref 13.0–17.0)
MCH: 28.3 pg (ref 26.0–34.0)
MCHC: 32.4 g/dL (ref 30.0–36.0)
MCV: 87.2 fL (ref 80.0–100.0)
Platelets: 216 10*3/uL (ref 150–400)
RBC: 4.67 MIL/uL (ref 4.22–5.81)
RDW: 15.2 % (ref 11.5–15.5)
WBC: 9 10*3/uL (ref 4.0–10.5)
nRBC: 0 % (ref 0.0–0.2)

## 2020-12-07 MED ORDER — ONDANSETRON 8 MG PO TBDP: 8.0000 mg | ORAL_TABLET | Freq: Three times a day (TID) | ORAL | 0 refills | Status: DC | PRN

## 2020-12-07 MED ORDER — HYDROCODONE-ACETAMINOPHEN 7.5-325 MG/15ML PO SOLN: 15.0000 mL | Freq: Four times a day (QID) | ORAL | 0 refills | Status: DC | PRN

## 2020-12-07 NOTE — Progress Notes (Signed)
      [address]       [place] [address]             [phone]      2 Days Post-Op Procedure(s) (LRB): XI ROBOTIC ASSISTED PARAESOPHAGEAL HERNIA REPAIR AND FUNDOPLICATION (N/A) ESOPHAGOGASTRODUODENOSCOPY (EGD) (N/A)  Subjective: No new complaints.   Sitting up in bed eating breakfast.  Tolerating pureed diet.  Denies N/V.  + BM  Objective: Vital signs in last 24 hours: Temp:  [98.2 F (36.8 C)-98.9 F (37.2 C)] 98.2 F (36.8 C) (08/26 0354) Pulse Rate:  [68-95] 68 (08/26 0354) Cardiac Rhythm: Normal sinus rhythm (08/25 1918) Resp:  [14-20] 15 (08/26 0354) BP: (136-170)/(89-103) 140/98 (08/26 0354) SpO2:  [93 %-96 %] 96 % (08/26 0354)  Intake/Output from previous day: 08/25 0701 - 08/26 0700 In: 320 [P.O.:320] Out: 650 [Urine:650]  General appearance: alert, cooperative, and no distress Heart: regular rate and rhythm Lungs: clear to auscultation bilaterally Abdomen: soft, non-tender; bowel sounds normal; no masses,  no organomegaly Extremities: extremities normal, atraumatic, no cyanosis or edema Wound: clean and dry  Lab Results: Recent Labs    12/06/20 0035 12/07/20 0114  WBC 11.7* 9.0  HGB 14.2 13.2  HCT 43.4 40.7  PLT 237 216   BMET:  Recent Labs    12/06/20 0035 12/07/20 0114  NA 135 136  K 4.1 3.4*  CL 104 105  CO2 23 23  GLUCOSE 134* 140*  BUN 12 20  CREATININE 1.25* 1.28*  CALCIUM 9.1 8.7*    PT/INR: No results for input(s): LABPROT, INR in the last 72 hours. ABG    Component Value Date/Time   PHART 7.390 12/03/2020 1500   HCO3 20.8 12/03/2020 1500   ACIDBASEDEF 3.3 (H) 12/03/2020 1500   O2SAT 97.7 12/03/2020 1500   CBG (last 3)  Recent Labs    12/05/20 2356 12/06/20 0608 12/06/20 1149  GLUCAP 151* 102* 134*    Assessment/Plan: S/P Procedure(s) (LRB): XI ROBOTIC ASSISTED PARAESOPHAGEAL HERNIA REPAIR AND FUNDOPLICATION (N/A) ESOPHAGOGASTRODUODENOSCOPY (EGD) (N/A)  CV- NSR, + HTN- will resume home  medications Pulm- no acute issues, continue IS Renal- creatinine remains stable at 1.25 GI- tolerating pureed diet, continue for now until follow up with Dr. Corky Sing- patient stable, will d/c home today   LOS: 2 days   Ellwood Handler, PA-C  12/07/2020

## 2020-12-07 NOTE — Progress Notes (Signed)
Discharged home accompanied by lady friend, belongings taken home. 

## 2020-12-07 NOTE — Care Management Important Message (Signed)
Important Message  Patient Details  Name: Joe Arroyo MRN: [id number] Date of Birth: 1956-01-20   Medicare Important Message Given:  Yes     [name] Montine Circle 12/07/2020, 3:32 PM

## 2020-12-07 NOTE — Care Management Important Message (Signed)
Important Message  Patient Details  Name: Joe Arroyo MRN: [id number] Date of Birth: 10/29/1955   Medicare Important Message Given:  Yes Patient left prior to IM delivery  will mail Im to the patient home address.     Jadis Pitter 12/07/2020, 3:30 PM

## 2020-12-08 ENCOUNTER — Telehealth: Payer: Self-pay | Admitting: Pulmonary Disease

## 2020-12-08 NOTE — Telephone Encounter (Signed)
I spoke with patient this morning to see how he was doing after his hiatal hernia surgery.  He reports he has very little pain at all and he has been doing okay.  He does not notice any significant improvement in his shortness of breath yet but he understands this will take time after he heals from the surgery.  He has scheduled follow-up on 9/6 with Tammy which we can cancel that appointment and he will call our office back in a month or 2 if he continues to have persistent dyspnea.  He expressed understanding with the plan.  Freda Jackson, MD [facility] Office: [phone]   See Amion for personal pager PCCM on call pager [phone] until 7pm. Please call Elink 7p-7a. [phone]

## 2020-12-10 NOTE — Telephone Encounter (Signed)
12/18/2020 OV has been canceled.  Will close encounter as nothing further is needed.

## 2020-12-11 NOTE — Anesthesia Postprocedure Evaluation (Signed)
Anesthesia Post Note  Patient: [name]  Procedure(s) Performed: XI ROBOTIC ASSISTED PARAESOPHAGEAL HERNIA REPAIR AND FUNDOPLICATION (Chest) ESOPHAGOGASTRODUODENOSCOPY (EGD)     Patient location during evaluation: PACU Anesthesia Type: General Level of consciousness: awake and alert Pain management: pain level controlled Vital Signs Assessment: post-procedure vital signs reviewed and stable Respiratory status: spontaneous breathing, nonlabored ventilation, respiratory function stable and patient connected to nasal cannula oxygen Cardiovascular status: blood pressure returned to baseline and stable Postop Assessment: no apparent nausea or vomiting Anesthetic complications: no   No notable events documented.  Last Vitals:  Vitals:   12/07/20 0354 12/07/20 0825  BP: (!) 140/98   Pulse: 68   Resp: 15   Temp: 36.8 C   SpO2: 96% 94%    Last Pain:  Vitals:   12/07/20 0742  TempSrc:   PainSc: 0-No pain                 Merica Prell

## 2020-12-14 ENCOUNTER — Other Ambulatory Visit: Payer: Self-pay

## 2020-12-14 ENCOUNTER — Telehealth (INDEPENDENT_AMBULATORY_CARE_PROVIDER_SITE_OTHER): Payer: Self-pay | Admitting: Thoracic Surgery (Cardiothoracic Vascular Surgery)

## 2020-12-14 DIAGNOSIS — K449 Diaphragmatic hernia without obstruction or gangrene: Secondary | ICD-10-CM

## 2020-12-14 NOTE — Progress Notes (Signed)
     [address]       [place] [address]             [phone]       Patient: Home Provider: Office Consent for Telemedicine visit obtained.  Today's visit was completed via a real-time telehealth (see specific modality noted below). The patient/authorized person provided oral consent at the time of the visit to engage in a telemedicine encounter with the present provider at [facility]. The patient/authorized person was informed of the potential benefits, limitations, and risks of telemedicine. The patient/authorized person expressed understanding that the laws that protect confidentiality also apply to telemedicine. The patient/authorized person acknowledged understanding that telemedicine does not provide emergency services and that he or she would need to call 911 or proceed to the nearest hospital for help if such a need arose.   Total time spent in the clinical discussion 10 minutes.  Telehealth Modality: Phone visit (audio only)  I had a telephone visit with Mr. Weedman.  From a digestive standpoint he is doing well.  He denies any reflux or dysphagia.  He remains short of breath.  He also describes some chest pain that is sharp and pokey in nature.  I instructed him that he can advance his diet to 4 tender food this week and then to a more regular diet next week.  He no longer needs to crush his medications.  He will follow-up with me in 1 month with a chest x-ray.

## 2020-12-18 ENCOUNTER — Ambulatory Visit: Payer: Medicare HMO | Admitting: Adult Health

## 2020-12-27 ENCOUNTER — Ambulatory Visit: Payer: Medicare HMO | Admitting: Family Medicine

## 2021-01-03 ENCOUNTER — Other Ambulatory Visit: Payer: Self-pay

## 2021-01-03 ENCOUNTER — Ambulatory Visit (INDEPENDENT_AMBULATORY_CARE_PROVIDER_SITE_OTHER): Payer: Medicare HMO | Admitting: Family Medicine

## 2021-01-03 ENCOUNTER — Encounter: Payer: Self-pay | Admitting: Family Medicine

## 2021-01-03 VITALS — BP 134/88 | HR 67 | Temp 96.7°F | Ht 72.0 in | Wt 192.0 lb

## 2021-01-03 DIAGNOSIS — E782 Mixed hyperlipidemia: Secondary | ICD-10-CM | POA: Diagnosis not present

## 2021-01-03 DIAGNOSIS — R0789 Other chest pain: Secondary | ICD-10-CM | POA: Diagnosis not present

## 2021-01-03 DIAGNOSIS — Z4802 Encounter for removal of sutures: Secondary | ICD-10-CM | POA: Diagnosis not present

## 2021-01-03 DIAGNOSIS — K5904 Chronic idiopathic constipation: Secondary | ICD-10-CM

## 2021-01-03 DIAGNOSIS — K449 Diaphragmatic hernia without obstruction or gangrene: Secondary | ICD-10-CM

## 2021-01-03 DIAGNOSIS — R0602 Shortness of breath: Secondary | ICD-10-CM

## 2021-01-03 DIAGNOSIS — K219 Gastro-esophageal reflux disease without esophagitis: Secondary | ICD-10-CM

## 2021-01-03 DIAGNOSIS — Z23 Encounter for immunization: Secondary | ICD-10-CM

## 2021-01-03 DIAGNOSIS — E1142 Type 2 diabetes mellitus with diabetic polyneuropathy: Secondary | ICD-10-CM | POA: Diagnosis not present

## 2021-01-03 DIAGNOSIS — I10 Essential (primary) hypertension: Secondary | ICD-10-CM

## 2021-01-03 NOTE — Progress Notes (Signed)
Subjective:  Patient ID: Joe Arroyo, male    DOB: [birth year]  Age: 65 y.o. MRN: [id number]  Chief Complaint  Patient presents with   Hyperlipidemia   Hypertension    HPI Hyperlipidemia: Current medications:Zetia 10 mg, Fish Oil, Crestor 10 mg  Hypertension: Complications: Current medications: Amlodipine 5 mg, Clonidine 0.3 mg one at night.   Diet:healthy Exercise:no GERD: nexium 20 mg daily. S/P hiatal surgery. Continue dyspnea particularly with speaking and exertion. Occasionally has pressure in chest worsens with exertion, but can occur at rest.  No nausea, vomiting, diaphoresis. Has some increase in his shortness of breat.  Patient has 1 suture that was apparently left where his chest tube was placed.  Requesting removal.  Constipation: BMS every 5 days. Uncomfortable. Tried Dulcolax, castor oil, 2 enemas. Was impacted 2 weeks ago and used the enemas.   Current Outpatient Medications on File Prior to Visit  Medication Sig Dispense Refill   amLODipine (NORVASC) 5 MG tablet Take 1 tablet (5 mg total) by mouth daily. 90 tablet 0   cloNIDine (CATAPRES) 0.3 MG tablet Take 1 tablet (0.3 mg total) by mouth daily. 90 tablet 1   esomeprazole (NEXIUM) 20 MG capsule Take by mouth daily at 12 noon. OTC     ezetimibe (ZETIA) 10 MG tablet Take 1 tablet (10 mg total) by mouth daily. 90 tablet 1   finasteride (PROSCAR) 5 MG tablet Take 5 mg by mouth daily.     fluticasone-salmeterol (ADVAIR HFA) 230-21 MCG/ACT inhaler Inhale 2 puffs into the lungs 2 (two) times daily. 1 each 12   HYDROcodone-acetaminophen (HYCET) 7.5-325 mg/15 ml solution Take 15 mLs by mouth 4 (four) times daily as needed for moderate pain. 118 mL 0   Multiple Vitamins-Minerals (CENTRUM SILVER ULTRA MENS PO) Take 1 tablet by mouth daily.     Omega-3 Fatty Acids (FISH OIL PO) Take 1 tablet by mouth 2 (two) times daily.     ondansetron (ZOFRAN ODT) 8 MG disintegrating tablet Take 1 tablet (8 mg total) by mouth every 8  (eight) hours as needed for nausea or vomiting. 20 tablet 0   rosuvastatin (CRESTOR) 10 MG tablet Take 1 tablet (10 mg total) by mouth at bedtime. 100 tablet 0   No current facility-administered medications on file prior to visit.   Past Medical History:  Diagnosis Date   Dyspnea    History of hiatal hernia    History of kidney stones    Hyperlipidemia 05/14/2018   Hypertension 05/14/2018   Kidney stones    Sinus bradycardia 05/14/2018   Sleep apnea    moderate OSA 02/20/20 HST   Thoracic ascending aortic aneurysm (HCC)    ascending TAA 4.6 x 4.4 cm 10/03/20 CTA chest   Past Surgical History:  Procedure Laterality Date   COLONOSCOPY     ESOPHAGOGASTRODUODENOSCOPY N/A 12/05/2020   Procedure: ESOPHAGOGASTRODUODENOSCOPY (EGD);  Surgeon: Lajuana Matte, MD;  Location: [facility] OR;  Service: Thoracic;  Laterality: N/A;   LAMINECTOMY     MOLE REMOVAL     PROSTATE BIOPSY     TONSILLECTOMY AND ADENOIDECTOMY     UPPER GASTROINTESTINAL ENDOSCOPY     XI ROBOTIC ASSISTED PARAESOPHAGEAL HERNIA REPAIR N/A 12/05/2020   Procedure: XI ROBOTIC ASSISTED PARAESOPHAGEAL HERNIA REPAIR AND FUNDOPLICATION;  Surgeon: Lajuana Matte, MD;  Location: [facility] OR;  Service: Thoracic;  Laterality: N/A;    Family History  Problem Relation Age of Onset   Hyperlipidemia Mother    Aortic stenosis Mother    Valvular  heart disease Mother    Colon cancer Mother    Diabetes Mellitus II Mother    Cancer - Colon Mother    Cancer Mother    Stroke Mother    Cerebrovascular Accident Mother    Diabetes Mother    Hypertension Mother    Hyperlipidemia Father    Diabetes Mellitus II Father    Diabetes Father    Esophageal cancer Paternal Uncle    CAD Maternal Grandmother    Congestive Heart Failure Maternal Grandmother    Heart attack Maternal Grandmother    Heart failure Maternal Grandmother    Coronary artery disease Maternal Grandmother    Hyperlipidemia Maternal Grandmother    Social History    Socioeconomic History   Marital status: Single    Spouse name: Not on file   Number of children: Not on file   Years of education: Not on file   Highest education level: Not on file  Occupational History   Not on file  Tobacco Use   Smoking status: Never   Smokeless tobacco: Never  Vaping Use   Vaping Use: Never used  Substance and Sexual Activity   Alcohol use: Yes    Comment: occ   Drug use: Never   Sexual activity: Not on file  Other Topics Concern   Not on file  Social History Narrative   Not on file   Social Determinants of Health   Financial Resource Strain: Not on file  Food Insecurity: Not on file  Transportation Needs: Not on file  Physical Activity: Not on file  Stress: Not on file  Social Connections: Not on file    Review of Systems  Constitutional:  Negative for chills, diaphoresis, fatigue and fever.  HENT:  Positive for congestion. Negative for ear pain and sore throat.   Respiratory:  Positive for shortness of breath. Negative for cough.   Cardiovascular:  Negative for chest pain and leg swelling.  Gastrointestinal:  Negative for abdominal pain, constipation, diarrhea, nausea and vomiting.  Genitourinary:  Negative for dysuria and urgency.  Musculoskeletal:  Negative for arthralgias and myalgias.  Neurological:  Negative for dizziness and headaches.  Psychiatric/Behavioral:  Negative for dysphoric mood.     Objective:  BP 134/88   Pulse 67   Temp (!) 96.7 F (35.9 C)   Ht 6' (1.829 m)   Wt 192 lb (87.1 kg)   SpO2 99%   BMI 26.04 kg/m   BP/Weight 01/03/2021 12/07/2020 4/33/2951  Systolic BP 884 166 -  Diastolic BP 88 98 -  Wt. (Lbs) 192 - 195  BMI 26.04 - 26.45    Physical Exam Vitals reviewed.  Constitutional:      Appearance: Normal appearance.  Neck:     Vascular: No carotid bruit.  Cardiovascular:     Rate and Rhythm: Normal rate and regular rhythm.     Pulses: Normal pulses.     Heart sounds: Normal heart sounds.   Pulmonary:     Effort: Pulmonary effort is normal.     Breath sounds: Normal breath sounds. No wheezing, rhonchi or rales.  Abdominal:     General: Bowel sounds are normal.     Palpations: Abdomen is soft.     Tenderness: There is no abdominal tenderness.  Neurological:     Mental Status: He is alert.  Psychiatric:        Mood and Affect: Mood normal.        Behavior: Behavior normal.    Diabetic Foot Exam -  Simple   No data filed      Lab Results  Component Value Date   WBC 9.0 12/07/2020   HGB 13.2 12/07/2020   HCT 40.7 12/07/2020   PLT 216 12/07/2020   GLUCOSE 140 (H) 12/07/2020   CHOL 145 09/25/2020   TRIG 126 09/25/2020   HDL 52 09/25/2020   LDLCALC 71 09/25/2020   ALT 51 (H) 12/07/2020   AST 50 (H) 12/07/2020   NA 136 12/07/2020   K 3.4 (L) 12/07/2020   CL 105 12/07/2020   CREATININE 1.28 (H) 12/07/2020   BUN 20 12/07/2020   CO2 23 12/07/2020   TSH 3.150 09/25/2020   INR 1.0 12/03/2020      Assessment & Plan:   Problem List Items Addressed This Visit       Other   Hyperlipidemia   Dyspnea   Other Visit Diagnoses     Essential hypertension    -  Primary   GERD without esophagitis       Need for immunization against influenza       Relevant Orders   Flu Vaccine QUAD High Dose(Fluad) (Completed)     .  No orders of the defined types were placed in this encounter.   Orders Placed This Encounter  Procedures   Flu Vaccine QUAD High Dose(Fluad)     Follow-up: No follow-ups on file.  An After Visit Summary was printed and given to the patient.  [name], MD [facility] [phone]

## 2021-01-03 NOTE — Patient Instructions (Signed)
Recommend start miralax 17 gm twice a day.    Constipation, Adult Constipation is when a person has trouble pooping (having a bowel movement). When you have this condition, you may poop fewer than 3 times a week. Your poop (stool) may also be dry, hard, or bigger than normal. Follow these instructions at home: Eating and drinking  Eat foods that have a lot of fiber, such as: Fresh fruits and vegetables. Whole grains. Beans. Eat less of foods that are low in fiber and high in fat and sugar, such as: [place] fries. Hamburgers. Cookies. Candy. Soda. Drink enough fluid to keep your pee (urine) pale yellow. General instructions Exercise regularly or as told by your doctor. Try to do 150 minutes of exercise each week. Go to the restroom when you feel like you need to poop. Do not hold it in. Take over-the-counter and prescription medicines only as told by your doctor. These include any fiber supplements. When you poop: Do deep breathing while relaxing your lower belly (abdomen). Relax your pelvic floor. The pelvic floor is a group of muscles that support the rectum, bladder, and intestines (as well as the uterus in women). Watch your condition for any changes. Tell your doctor if you notice any. Keep all follow-up visits as told by your doctor. This is important. Contact a doctor if: You have pain that gets worse. You have a fever. You have not pooped for 4 days. You vomit. You are not hungry. You lose weight. You are bleeding from the opening of the butt (anus). You have thin, pencil-like poop. Get help right away if: You have a fever, and your symptoms suddenly get worse. You leak poop or have blood in your poop. Your belly feels hard or bigger than normal (bloated). You have very bad belly pain. You feel dizzy or you faint. Summary Constipation is when a person poops fewer than 3 times a week, has trouble pooping, or has poop that is dry, hard, or bigger than normal. Eat  foods that have a lot of fiber. Drink enough fluid to keep your pee (urine) pale yellow. Take over-the-counter and prescription medicines only as told by your doctor. These include any fiber supplements. This information is not intended to replace advice given to you by your health care provider. Make sure you discuss any questions you have with your health care provider. Document Revised: 02/16/2019 Document Reviewed: 02/16/2019 Elsevier Patient Education  [place].

## 2021-01-04 LAB — LIPID PANEL
Chol/HDL Ratio: 2.6 ratio (ref 0.0–5.0)
Cholesterol, Total: 148 mg/dL (ref 100–199)
HDL: 57 mg/dL (ref >39)
LDL Chol Calc (NIH): 72 mg/dL (ref 0–99)
Triglycerides: 107 mg/dL (ref 0–149)
VLDL Cholesterol Cal: 19 mg/dL (ref 5–40)

## 2021-01-04 LAB — COMPREHENSIVE METABOLIC PANEL
ALT: 28 IU/L (ref 0–44)
AST: 33 IU/L (ref 0–40)
Albumin/Globulin Ratio: 2.2 (ref 1.2–2.2)
Albumin: 5 g/dL — ABNORMAL HIGH (ref 3.8–4.8)
Alkaline Phosphatase: 130 IU/L — ABNORMAL HIGH (ref 44–121)
BUN/Creatinine Ratio: 16 (ref 10–24)
BUN: 18 mg/dL (ref 8–27)
Bilirubin Total: 0.4 mg/dL (ref 0.0–1.2)
CO2: 21 mmol/L (ref 20–29)
Calcium: 10.2 mg/dL (ref 8.6–10.2)
Chloride: 102 mmol/L (ref 96–106)
Creatinine, Ser: 1.16 mg/dL (ref 0.76–1.27)
Globulin, Total: 2.3 g/dL (ref 1.5–4.5)
Glucose: 84 mg/dL (ref 65–99)
Potassium: 4.6 mmol/L (ref 3.5–5.2)
Sodium: 141 mmol/L (ref 134–144)
Total Protein: 7.3 g/dL (ref 6.0–8.5)
eGFR: 69 mL/min/{1.73_m2} (ref >59)

## 2021-01-04 LAB — CBC WITH DIFFERENTIAL/PLATELET
Basophils Absolute: 0 10*3/uL (ref 0.0–0.2)
Basos: 0 %
EOS (ABSOLUTE): 0.2 10*3/uL (ref 0.0–0.4)
Eos: 2 %
Hematocrit: 47.6 % (ref 37.5–51.0)
Hemoglobin: 15.5 g/dL (ref 13.0–17.7)
Immature Grans (Abs): 0 10*3/uL (ref 0.0–0.1)
Immature Granulocytes: 0 %
Lymphocytes Absolute: 1.6 10*3/uL (ref 0.7–3.1)
Lymphs: 17 %
MCH: 27.9 pg (ref 26.6–33.0)
MCHC: 32.6 g/dL (ref 31.5–35.7)
MCV: 86 fL (ref 79–97)
Monocytes Absolute: 1.1 10*3/uL — ABNORMAL HIGH (ref 0.1–0.9)
Monocytes: 11 %
Neutrophils Absolute: 6.6 10*3/uL (ref 1.4–7.0)
Neutrophils: 70 %
Platelets: 245 10*3/uL (ref 150–450)
RBC: 5.56 x10E6/uL (ref 4.14–5.80)
RDW: 15.7 % — ABNORMAL HIGH (ref 11.6–15.4)
WBC: 9.5 10*3/uL (ref 3.4–10.8)

## 2021-01-07 DIAGNOSIS — K5904 Chronic idiopathic constipation: Secondary | ICD-10-CM

## 2021-01-07 DIAGNOSIS — R0789 Other chest pain: Secondary | ICD-10-CM | POA: Insufficient documentation

## 2021-01-07 DIAGNOSIS — N200 Calculus of kidney: Secondary | ICD-10-CM

## 2021-01-07 DIAGNOSIS — Z4802 Encounter for removal of sutures: Secondary | ICD-10-CM | POA: Insufficient documentation

## 2021-01-07 HISTORY — DX: Calculus of kidney: N20.0

## 2021-01-07 HISTORY — DX: Other chest pain: R07.89

## 2021-01-07 HISTORY — DX: Chronic idiopathic constipation: K59.04

## 2021-01-07 NOTE — Assessment & Plan Note (Signed)
1 suture removed.  No complications.

## 2021-01-07 NOTE — Assessment & Plan Note (Signed)
Continue current medications.  Well-controlled. Continue to eat healthy.  Recommend exercise as able.

## 2021-01-07 NOTE — Assessment & Plan Note (Signed)
Persistent.  Recent hiatal hernia surgery. Previous cardiac and pulmonary work-ups negative. Keep follow-up with surgeon.

## 2021-01-07 NOTE — Assessment & Plan Note (Signed)
Start on MiraLAX 17 g twice daily. Education given.

## 2021-01-07 NOTE — Assessment & Plan Note (Signed)
Status postsurgery.  Resolved.

## 2021-01-07 NOTE — Assessment & Plan Note (Signed)
Continue current medications. Status post hiatal hernia surgery.  This was in the hopes of improving his persistent dyspnea.  Unfortunately he has not improved.

## 2021-01-07 NOTE — Assessment & Plan Note (Signed)
Well controlled. Continue current medications  

## 2021-01-11 DIAGNOSIS — R0789 Other chest pain: Secondary | ICD-10-CM

## 2021-01-18 DIAGNOSIS — C61 Malignant neoplasm of prostate: Secondary | ICD-10-CM | POA: Diagnosis not present

## 2021-01-18 DIAGNOSIS — Z87442 Personal history of urinary calculi: Secondary | ICD-10-CM | POA: Diagnosis not present

## 2021-01-24 ENCOUNTER — Other Ambulatory Visit: Payer: Self-pay | Admitting: Thoracic Surgery (Cardiothoracic Vascular Surgery)

## 2021-01-24 DIAGNOSIS — K449 Diaphragmatic hernia without obstruction or gangrene: Secondary | ICD-10-CM

## 2021-01-25 ENCOUNTER — Ambulatory Visit (HOSPITAL_COMMUNITY)
Admission: RE | Admit: 2021-01-25 | Discharge: 2021-01-25 | Disposition: A | Discharge status: Home / Self Care | Payer: Medicare HMO | Source: Ambulatory Visit | Attending: Thoracic Surgery (Cardiothoracic Vascular Surgery) | Admitting: Thoracic Surgery (Cardiothoracic Vascular Surgery)

## 2021-01-25 ENCOUNTER — Ambulatory Visit (INDEPENDENT_AMBULATORY_CARE_PROVIDER_SITE_OTHER): Payer: Self-pay | Admitting: Thoracic Surgery (Cardiothoracic Vascular Surgery)

## 2021-01-25 ENCOUNTER — Other Ambulatory Visit: Payer: Self-pay

## 2021-01-25 VITALS — BP 154/95 | HR 98 | Resp 20 | Ht 72.0 in | Wt 194.0 lb

## 2021-01-25 DIAGNOSIS — R0602 Shortness of breath: Secondary | ICD-10-CM | POA: Diagnosis not present

## 2021-01-25 DIAGNOSIS — K449 Diaphragmatic hernia without obstruction or gangrene: Secondary | ICD-10-CM | POA: Insufficient documentation

## 2021-01-25 DIAGNOSIS — Z09 Encounter for follow-up examination after completed treatment for conditions other than malignant neoplasm: Secondary | ICD-10-CM

## 2021-01-25 NOTE — Progress Notes (Signed)
      [address]       [place] [address]             [phone]        Angie Schlee  Medical Record #[id number] Date of Birth: [birth year]  Referring: [name], MD Primary Care: [name], MD Primary Cardiologist:None  Reason for visit:   follow-up  History of Present Illness:     Mr. Mokry comes in for his 1 month follow-up appointment.  He denies any dysphagia or reflux.  He is tolerating a regular diet.  Unfortunately, his dyspnea has not improved.  He states that is slightly worse.  Physical Exam: BP (!) 154/95 (BP Location: Right Arm, Patient Position: Sitting)   Pulse 98   Resp 20   Ht 6' (1.829 m)   Wt 194 lb (88 kg)   SpO2 95% Comment: RA  BMI 26.31 kg/m   Alert NAD Incision clean.   Abdomen ND no peripheral edema   Diagnostic Studies & Laboratory data: CXR: clear     Assessment / Plan:   65 yo male s/p hiatal hernia repair and fundoplication.  His main complaint was shortness of breath.  Unfortunately, this did not improve following reduction of his hernia.  He is scheduled to meet with Dr. Erin Fulling for further follow-up.  I explained to him that if this is aspiration pneumonitis this may take some more time to resolve.   Lajuana Matte 01/25/2021 3:12 PM

## 2021-01-31 ENCOUNTER — Encounter: Payer: Self-pay | Admitting: Pulmonary Disease

## 2021-01-31 ENCOUNTER — Other Ambulatory Visit: Payer: Self-pay

## 2021-01-31 ENCOUNTER — Ambulatory Visit (INDEPENDENT_AMBULATORY_CARE_PROVIDER_SITE_OTHER): Payer: Medicare HMO | Admitting: Pulmonary Disease

## 2021-01-31 VITALS — BP 128/84 | HR 72 | Ht 72.0 in | Wt 193.0 lb

## 2021-01-31 DIAGNOSIS — R0602 Shortness of breath: Secondary | ICD-10-CM | POA: Diagnosis not present

## 2021-01-31 NOTE — Progress Notes (Signed)
Synopsis: Referred in February 2022 by [name], MD for dyspnea on exertion.  Subjective:   PATIENT ID: Joe Arroyo GENDER: male DOB: December 28, 1955, MRN: [id number]  HPI  Chief Complaint  Patient presents with   Follow-up    F/U for SOB. States that he had his hernia surgery back in August but his SOB has actually gotten worse especially with exertion.    Joe Arroyo is a 65 year old male, never smoker with hypertension, hyperlipidemia and moderate obstructive sleep apnea who returns to pulmonary clinic for evaluation of dyspnea.   He continues to experience shortness of breath at rest and with exertion since last December. He had hiatal hernia repair surgery 12/05/20 with significant improvement in his reflux symptoms but no improvement in his dyspnea.   He has not noticed improvement in his breathing with ICS/LABA therapy.   He has been evaluated by ENT for vocal cord dysfunction which was not noted on there flexible laryngoscopy evaluation.   He has been evaluated by cardiology with no distinct cardiac etiology to his dyspnea.  BNP is not elevated. ANA, RF, ESR, CCP and CRP are not elevated. CBC remains within normal limits. TSH is normal. Sniff test shows normal diaphragmatic function, no paralysis present. PFTs are within normal limits although he has an isolated decrease in his FVC that is mild.   ABG from 11/2020 showed pH 7.45, pCO2 32, pO2 89 and bicarb 22. Serial BMPs have been notable for low to low normal levels of serum bicarb over the last year.   He was diagnosed with moderate obstructive sleep apnea with an AHI of 15.6/hr from a home sleep study on 03/01/20. He has not wanted to try CPAP therapy.  Past Medical History:  Diagnosis Date   Dyspnea    History of hiatal hernia    History of kidney stones    Hyperlipidemia 05/14/2018   Hypertension 05/14/2018   Kidney stones    Sinus bradycardia 05/14/2018   Sleep apnea    moderate OSA 02/20/20 HST   Thoracic  ascending aortic aneurysm    ascending TAA 4.6 x 4.4 cm 10/03/20 CTA chest     Family History  Problem Relation Age of Onset   Hyperlipidemia Mother    Aortic stenosis Mother    Valvular heart disease Mother    Colon cancer Mother    Diabetes Mellitus II Mother    Cancer - Colon Mother    Cancer Mother    Stroke Mother    Cerebrovascular Accident Mother    Diabetes Mother    Hypertension Mother    Hyperlipidemia Father    Diabetes Mellitus II Father    Diabetes Father    Esophageal cancer Paternal Uncle    CAD Maternal Grandmother    Congestive Heart Failure Maternal Grandmother    Heart attack Maternal Grandmother    Heart failure Maternal Grandmother    Coronary artery disease Maternal Grandmother    Hyperlipidemia Maternal Grandmother      Social History   Socioeconomic History   Marital status: Single    Spouse name: Not on file   Number of children: Not on file   Years of education: Not on file   Highest education level: Not on file  Occupational History   Not on file  Tobacco Use   Smoking status: Never   Smokeless tobacco: Never  Vaping Use   Vaping Use: Never used  Substance and Sexual Activity   Alcohol use: Yes    Comment:  occ   Drug use: Never   Sexual activity: Not on file  Other Topics Concern   Not on file  Social History Narrative   Not on file   Social Determinants of Health   Financial Resource Strain: Not on file  Food Insecurity: Not on file  Transportation Needs: Not on file  Physical Activity: Not on file  Stress: Not on file  Social Connections: Not on file  Intimate Partner Violence: Not on file     Allergies  Allergen Reactions   Bystolic [Nebivolol Hcl] Other (See Comments)    bradycardia   Carvedilol Other (See Comments)    bradycardia   Pravastatin Other (See Comments)    "made my joints hurt"   Hctz [Hydrochlorothiazide] Rash   Hydralazine Rash   Penicillins Rash     Outpatient Medications Prior to Visit   Medication Sig Dispense Refill   amLODipine (NORVASC) 5 MG tablet Take 1 tablet (5 mg total) by mouth daily. 90 tablet 0   cloNIDine (CATAPRES) 0.3 MG tablet Take 1 tablet (0.3 mg total) by mouth daily. 90 tablet 1   esomeprazole (NEXIUM) 20 MG capsule Take by mouth daily at 12 noon. OTC     ezetimibe (ZETIA) 10 MG tablet Take 1 tablet (10 mg total) by mouth daily. 90 tablet 1   finasteride (PROSCAR) 5 MG tablet Take 5 mg by mouth daily.     fluticasone-salmeterol (ADVAIR HFA) 230-21 MCG/ACT inhaler Inhale 2 puffs into the lungs 2 (two) times daily. 1 each 12   HYDROcodone-acetaminophen (HYCET) 7.5-325 mg/15 ml solution Take 15 mLs by mouth 4 (four) times daily as needed for moderate pain. 118 mL 0   Multiple Vitamins-Minerals (CENTRUM SILVER ULTRA MENS PO) Take 1 tablet by mouth daily.     Omega-3 Fatty Acids (FISH OIL PO) Take 1 tablet by mouth 2 (two) times daily.     ondansetron (ZOFRAN ODT) 8 MG disintegrating tablet Take 1 tablet (8 mg total) by mouth every 8 (eight) hours as needed for nausea or vomiting. 20 tablet 0   rosuvastatin (CRESTOR) 10 MG tablet Take 1 tablet (10 mg total) by mouth at bedtime. 100 tablet 0   No facility-administered medications prior to visit.    Review of Systems  Constitutional:  Negative for chills, fever, malaise/fatigue and weight loss.  HENT:  Negative for congestion, sinus pain and sore throat.   Eyes: Negative.   Respiratory:  Positive for shortness of breath. Negative for cough, hemoptysis, sputum production and wheezing.   Cardiovascular:  Negative for chest pain, palpitations, orthopnea, claudication and leg swelling.  Gastrointestinal:  Negative for abdominal pain, heartburn, nausea and vomiting.  Genitourinary: Negative.   Musculoskeletal:  Negative for joint pain and myalgias.  Skin:  Negative for rash.  Neurological:  Negative for weakness.  Endo/Heme/Allergies: Negative.   Psychiatric/Behavioral: Negative.     Objective:   Vitals:    01/31/21 1150  BP: 128/84  Pulse: 72  SpO2: 98%  Weight: 193 lb (87.5 kg)  Height: 6' (1.829 m)     Physical Exam Constitutional:      General: He is not in acute distress. HENT:     Head: Normocephalic and atraumatic.  Eyes:     Extraocular Movements: Extraocular movements intact.     Conjunctiva/sclera: Conjunctivae normal.     Pupils: Pupils are equal, round, and reactive to light.  Cardiovascular:     Rate and Rhythm: Normal rate and regular rhythm.     Pulses: Normal pulses.  Heart sounds: Normal heart sounds. No murmur heard. Pulmonary:     Breath sounds: No stridor or transmitted upper airway sounds. No wheezing, rhonchi or rales.  Abdominal:     General: Bowel sounds are normal.     Palpations: Abdomen is soft.  Musculoskeletal:     Right lower leg: No edema.     Left lower leg: No edema.  Lymphadenopathy:     Cervical: No cervical adenopathy.  Skin:    General: Skin is warm and dry.  Neurological:     General: No focal deficit present.     Mental Status: He is alert.  Psychiatric:        Mood and Affect: Mood normal.        Behavior: Behavior normal.        Thought Content: Thought content normal.        Judgment: Judgment normal.    CBC    Component Value Date/Time   WBC 9.5 01/03/2021 1053   WBC 9.0 12/07/2020 0114   RBC 5.56 01/03/2021 1053   RBC 4.67 12/07/2020 0114   HGB 15.5 01/03/2021 1053   HCT 47.6 01/03/2021 1053   PLT 245 01/03/2021 1053   MCV 86 01/03/2021 1053   MCH 27.9 01/03/2021 1053   MCH 28.3 12/07/2020 0114   MCHC 32.6 01/03/2021 1053   MCHC 32.4 12/07/2020 0114   RDW 15.7 (H) 01/03/2021 1053   LYMPHSABS 1.6 01/03/2021 1053   MONOABS 0.8 06/04/2020 1033   EOSABS 0.2 01/03/2021 1053   BASOSABS 0.0 01/03/2021 1053   BMP Latest Ref Rng & Units 01/03/2021 12/07/2020 12/06/2020  Glucose 65 - 99 mg/dL 84 140(H) 134(H)  BUN 8 - 27 mg/dL '18 20 12  ' Creatinine 0.76 - 1.27 mg/dL 1.16 1.28(H) 1.25(H)  BUN/Creat Ratio 10 - 24 16 - -   Sodium 134 - 144 mmol/L 141 136 135  Potassium 3.5 - 5.2 mmol/L 4.6 3.4(L) 4.1  Chloride 96 - 106 mmol/L 102 105 104  CO2 20 - 29 mmol/L '21 23 23  ' Calcium 8.6 - 10.2 mg/dL 10.2 8.7(L) 9.1   Chest imaging: CTA Chest 10/03/20 Minimal, bland appearing scarring or partial atelectasis in bilateral lung bases, including compressive atelectasis of the left lung base secondary to hiatal hernia.  - Ascending thoracic aortic aneurysm, 4.6 x 4.4cm  DG Sniff Test 08/13/20 No evidence for hemidiaphragmatic paralysis. Normal bilateral hemidiaphragmatic excursion. Hiatal hernia again noted.   HRCT Chest 06/21/20 - No evidence of interstitial lung disease. Mild air trapping is indicative of small airways disease. -  Large hiatal hernia.  PFT: PFT Results Latest Ref Rng & Units 07/12/2020  FVC-Pre L 3.56  FVC-Predicted Pre % 72  FVC-Post L 3.82  FVC-Predicted Post % 77  Pre FEV1/FVC % % 81  Post FEV1/FCV % % 85  FEV1-Pre L 2.89  FEV1-Predicted Pre % 78  FEV1-Post L 3.24  DLCO uncorrected ml/min/mmHg 22.98  DLCO UNC% % 81  DLCO corrected ml/min/mmHg 25.74  DLCO COR %Predicted % 91  DLVA Predicted % 112  TLC L 6.19  TLC % Predicted % 83  RV % Predicted % 90  2022: Within normal limits  Echo 05/11/20:  1. Left ventricular ejection fraction, by estimation, is 60 to 65%. The  left ventricle has normal function. The left ventricle has no regional  wall motion abnormalities. There is mild left ventricular hypertrophy.  Left ventricular diastolic parameters  are consistent with Grade I diastolic dysfunction (impaired relaxation).   2. Right ventricular  systolic function is normal. The right ventricular  size is normal.   3. The mitral valve is normal in structure. No evidence of mitral valve  regurgitation. No evidence of mitral stenosis.   4. The aortic valve is normal in structure. There is mild calcification  of the aortic valve. There is mild thickening of the aortic valve. Aortic  valve  regurgitation is mild. No aortic stenosis is present.   5. There is moderate dilatation of the ascending aorta, measuring 45 mm.   6. The inferior vena cava is normal in size with greater than 50%  respiratory variability, suggesting right atrial pressure of 3 mmHg.    Assessment & Plan:   Shortness of breath - Plan: Ambulatory referral to Nephrology, Pulmonary Function Test  Discussion: Joe Arroyo is a 65 year old male, never smoker with hypertension, hyperlipidemia and moderate obstructive sleep apnea who returns to pulmonary clinic for evaluation of dyspnea.   The etiology of his dyspnea remains unclear at this time.  HRCT chest and his PFTs are not indicative of an interstitial lung disease. Less concern for pulmonary hypertension based on PFTs, TTE and caliber of pulmonary artery on CTA chest. He is not anemic. His PFTs are normal except for a mildly reduced FVC. Sniff test is unremarkable.   We will repeat pulmonary function tests to monitor for any changes in his lung function.  Hyperventilation syndrome is another possibility based on his ABG from 8/22 with pH 7.45, pCO2 32, pO2 89 and bicarb 22. He denies any excessive stress or issues with anxiety.   On review of BMPs, serial bicarb levels have ranged down to 20 to 25mol/L which raises the question for any renal wasting cause to his low bicarb level.   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   He is to stop advair inhaler as he has not noticed benefit and monitor for any worsening of symptoms.   Follow up in 3 months.  JFreda Jackson MD [facility] Office: [phone]    Current Outpatient Medications:    amLODipine (NORVASC) 5 MG tablet, Take 1 tablet (5 mg total) by mouth daily., Disp: 90 tablet, Rfl: 0   cloNIDine (CATAPRES) 0.3 MG tablet, Take 1 tablet (0.3 mg total) by mouth daily., Disp:  90 tablet, Rfl: 1   esomeprazole (NEXIUM) 20 MG capsule, Take by mouth daily at 12 noon. OTC, Disp: , Rfl:    ezetimibe (ZETIA) 10 MG tablet, Take 1 tablet (10 mg total) by mouth daily., Disp: 90 tablet, Rfl: 1   finasteride (PROSCAR) 5 MG tablet, Take 5 mg by mouth daily., Disp: , Rfl:    fluticasone-salmeterol (ADVAIR HFA) 230-21 MCG/ACT inhaler, Inhale 2 puffs into the lungs 2 (two) times daily., Disp: 1 each, Rfl: 12   HYDROcodone-acetaminophen (HYCET) 7.5-325 mg/15 ml solution, Take 15 mLs by mouth 4 (four) times daily as needed for moderate pain., Disp: 118 mL, Rfl: 0   Multiple Vitamins-Minerals (CENTRUM SILVER ULTRA MENS PO), Take 1 tablet by mouth daily., Disp: , Rfl:    Omega-3 Fatty Acids (FISH OIL PO), Take 1 tablet by mouth 2 (two) times daily., Disp: , Rfl:    ondansetron (ZOFRAN ODT) 8 MG disintegrating tablet, Take 1 tablet (8 mg total) by mouth every 8 (eight) hours as needed for nausea or vomiting., Disp: 20 tablet, Rfl: 0   rosuvastatin (CRESTOR) 10 MG tablet, Take 1  tablet (10 mg total) by mouth at bedtime., Disp: 100 tablet, Rfl: 0

## 2021-01-31 NOTE — Patient Instructions (Addendum)
We will stop the advair inhaler and monitor your symptoms for any worsening.   We will refer you to nephrology for further evaluation of low to low normal levels of serum bicarb.   We will check pulmonary function tests at your convenience and call with the results

## 2021-02-01 ENCOUNTER — Encounter: Payer: Self-pay | Admitting: Pulmonary Disease

## 2021-02-04 ENCOUNTER — Other Ambulatory Visit: Payer: Self-pay

## 2021-02-04 DIAGNOSIS — I10 Essential (primary) hypertension: Secondary | ICD-10-CM

## 2021-02-04 MED ORDER — AMLODIPINE BESYLATE 5 MG PO TABS: 5.0000 mg | ORAL_TABLET | Freq: Every day | ORAL | 0 refills | Status: DC

## 2021-02-05 ENCOUNTER — Ambulatory Visit (INDEPENDENT_AMBULATORY_CARE_PROVIDER_SITE_OTHER): Payer: Medicare HMO | Admitting: Pulmonary Disease

## 2021-02-05 ENCOUNTER — Other Ambulatory Visit: Payer: Self-pay

## 2021-02-05 DIAGNOSIS — R0602 Shortness of breath: Secondary | ICD-10-CM | POA: Diagnosis not present

## 2021-02-05 LAB — PULMONARY FUNCTION TEST
DL/VA % pred: 92 %
DL/VA: 3.74 ml/min/mmHg/L
DLCO cor % pred: 63 %
DLCO cor: 17.97 ml/min/mmHg
DLCO unc % pred: 65 %
DLCO unc: 18.41 ml/min/mmHg
FEF 25-75 Post: 1.9 L/sec
FEF 25-75 Pre: 2.96 L/sec
FEF2575-%Change-Post: -35 %
FEF2575-%Pred-Post: 67 %
FEF2575-%Pred-Pre: 105 %
FEV1-%Change-Post: -8 %
FEV1-%Pred-Post: 70 %
FEV1-%Pred-Pre: 77 %
FEV1-Post: 2.57 L
FEV1-Pre: 2.82 L
FEV1FVC-%Change-Post: 9 %
FEV1FVC-%Pred-Pre: 109 %
FEV6-%Change-Post: -16 %
FEV6-%Pred-Post: 62 %
FEV6-%Pred-Pre: 74 %
FEV6-Post: 2.88 L
FEV6-Pre: 3.46 L
FEV6FVC-%Pred-Post: 105 %
FEV6FVC-%Pred-Pre: 105 %
FVC-%Change-Post: -16 %
FVC-%Pred-Post: 59 %
FVC-%Pred-Pre: 70 %
FVC-Post: 2.88 L
FVC-Pre: 3.46 L
Post FEV1/FVC ratio: 89 %
Post FEV6/FVC ratio: 100 %
Pre FEV1/FVC ratio: 82 %
Pre FEV6/FVC Ratio: 100 %
RV % pred: 93 %
RV: 2.33 L
TLC % pred: 76 %
TLC: 5.65 L

## 2021-02-05 NOTE — Progress Notes (Signed)
PFT done today. 

## 2021-02-06 ENCOUNTER — Telehealth: Payer: Self-pay | Admitting: Pulmonary Disease

## 2021-02-06 DIAGNOSIS — R0602 Shortness of breath: Secondary | ICD-10-CM

## 2021-02-06 NOTE — Telephone Encounter (Signed)
Please let patient know that there are changes in his pulmonary function testing where there has been a decline in his diffusion capacity (how well oxygen travels across the lung) and in the total lung capacity (measuring how flexible the lungs are). I would like to repeat a high resolution chest CT which can be scheduled at [facility]. Please place order if he is ok with moving forward with CT Chest.  Thanks, Wille Glaser

## 2021-02-06 NOTE — Telephone Encounter (Signed)
Called and spoke with patient. He verbalized understanding of the PFT results. He wishes to proceed with the CT scan at [facility]. He is aware that I will place the order today. Will also specify in the order for them to mail [place] a physical CD with the CT on it.   Nothing further needed at time of call.

## 2021-02-08 ENCOUNTER — Telehealth: Payer: Self-pay | Admitting: Thoracic Surgery (Cardiothoracic Vascular Surgery)

## 2021-02-08 ENCOUNTER — Other Ambulatory Visit: Payer: Self-pay

## 2021-02-18 ENCOUNTER — Other Ambulatory Visit: Payer: Self-pay

## 2021-02-18 MED ORDER — EZETIMIBE 10 MG PO TABS: 10.0000 mg | ORAL_TABLET | Freq: Every day | ORAL | 1 refills | Status: DC

## 2021-02-21 DIAGNOSIS — K449 Diaphragmatic hernia without obstruction or gangrene: Secondary | ICD-10-CM | POA: Diagnosis not present

## 2021-02-21 DIAGNOSIS — R918 Other nonspecific abnormal finding of lung field: Secondary | ICD-10-CM | POA: Diagnosis not present

## 2021-02-21 DIAGNOSIS — I700 Atherosclerosis of aorta: Secondary | ICD-10-CM | POA: Diagnosis not present

## 2021-02-21 DIAGNOSIS — R0602 Shortness of breath: Secondary | ICD-10-CM | POA: Diagnosis not present

## 2021-02-21 DIAGNOSIS — I7121 Aneurysm of the ascending aorta, without rupture: Secondary | ICD-10-CM | POA: Diagnosis not present

## 2021-02-25 ENCOUNTER — Other Ambulatory Visit: Payer: Self-pay

## 2021-02-25 MED ORDER — CLONIDINE HCL 0.3 MG PO TABS: 0.3000 mg | ORAL_TABLET | Freq: Every day | ORAL | 1 refills | Status: DC

## 2021-03-06 ENCOUNTER — Other Ambulatory Visit: Payer: Self-pay

## 2021-03-06 MED ORDER — MELOXICAM 15 MG PO TABS: 15.0000 mg | ORAL_TABLET | Freq: Every day | ORAL | 0 refills | Status: DC

## 2021-04-15 NOTE — Progress Notes (Signed)
Subjective:  Patient ID: Joe Arroyo, male    DOB: [birth year]  Age: 65 y.o. MRN: [id number]  Chief Complaint  Patient presents with   Hypertension   Hyperlipidemia    Hyperlipidemia: zetia, rosuvastatin, and fish oil. Eats healthy. Minimal exercise.  Dyspnea: unknown etiology. On advair hfa. Doesn't feet it helps. Pt is scheduled to return to see Dr. Raeanne Barry for issue.  Pt becomes tired and dyspneic with activity. Do not know if you guys how young you go if you would do speech therapy with this young man Hypertension:on amlodipine 5 mg daily, clonidine 0.3 mg daily,   OSA: does not wear cpap. Has never tried it. Does not feel he needs it.    Current Outpatient Medications on File Prior to Visit  Medication Sig Dispense Refill   amLODipine (NORVASC) 5 MG tablet Take 1 tablet (5 mg total) by mouth daily. 90 tablet 0   cloNIDine (CATAPRES) 0.3 MG tablet Take 1 tablet (0.3 mg total) by mouth daily. 90 tablet 1   esomeprazole (NEXIUM) 20 MG capsule Take by mouth daily at 12 noon. OTC     ezetimibe (ZETIA) 10 MG tablet Take 1 tablet (10 mg total) by mouth daily. 90 tablet 1   finasteride (PROSCAR) 5 MG tablet Take 5 mg by mouth daily.     fluticasone-salmeterol (ADVAIR HFA) 230-21 MCG/ACT inhaler Inhale 2 puffs into the lungs 2 (two) times daily. 1 each 12   meloxicam (MOBIC) 15 MG tablet Take 1 tablet (15 mg total) by mouth daily. Take one tablet daily as needed 90 tablet 0   Multiple Vitamins-Minerals (CENTRUM SILVER ULTRA MENS PO) Take 1 tablet by mouth daily.     Omega-3 Fatty Acids (FISH OIL PO) Take 1 tablet by mouth 2 (two) times daily.     rosuvastatin (CRESTOR) 10 MG tablet Take 1 tablet (10 mg total) by mouth at bedtime. 100 tablet 0   No current facility-administered medications on file prior to visit.   Past Medical History:  Diagnosis Date   Dyspnea    History of hiatal hernia    History of kidney stones    Hyperlipidemia 05/14/2018   Hypertension 05/14/2018   Kidney  stones    Sinus bradycardia 05/14/2018   Sleep apnea    moderate OSA 02/20/20 HST   Thoracic ascending aortic aneurysm    ascending TAA 4.6 x 4.4 cm 10/03/20 CTA chest   Past Surgical History:  Procedure Laterality Date   COLONOSCOPY     ESOPHAGOGASTRODUODENOSCOPY N/A 12/05/2020   Procedure: ESOPHAGOGASTRODUODENOSCOPY (EGD);  Surgeon: Lajuana Matte, MD;  Location: [facility] OR;  Service: Thoracic;  Laterality: N/A;   LAMINECTOMY     MOLE REMOVAL     PROSTATE BIOPSY     TONSILLECTOMY AND ADENOIDECTOMY     UPPER GASTROINTESTINAL ENDOSCOPY     XI ROBOTIC ASSISTED PARAESOPHAGEAL HERNIA REPAIR N/A 12/05/2020   Procedure: XI ROBOTIC ASSISTED PARAESOPHAGEAL HERNIA REPAIR AND FUNDOPLICATION;  Surgeon: Lajuana Matte, MD;  Location: [place];  Service: Thoracic;  Laterality: N/A;    Family History  Problem Relation Age of Onset   Hyperlipidemia Mother    Aortic stenosis Mother    Valvular heart disease Mother    Colon cancer Mother    Diabetes Mellitus II Mother    Cancer - Colon Mother    Cancer Mother    Stroke Mother    Cerebrovascular Accident Mother    Diabetes Mother    Hypertension Mother    Hyperlipidemia Father  Diabetes Mellitus II Father    Diabetes Father    Esophageal cancer Paternal Uncle    CAD Maternal Grandmother    Congestive Heart Failure Maternal Grandmother    Heart attack Maternal Grandmother    Heart failure Maternal Grandmother    Coronary artery disease Maternal Grandmother    Hyperlipidemia Maternal Grandmother    Social History   Socioeconomic History   Marital status: Single    Spouse name: Not on file   Number of children: Not on file   Years of education: Not on file   Highest education level: Not on file  Occupational History   Not on file  Tobacco Use   Smoking status: Never   Smokeless tobacco: Never  Vaping Use   Vaping Use: Never used  Substance and Sexual Activity   Alcohol use: Yes    Comment: occ   Drug use: Never   Sexual  activity: Not on file  Other Topics Concern   Not on file  Social History Narrative   Not on file   Social Determinants of Health   Financial Resource Strain: Not on file  Food Insecurity: Not on file  Transportation Needs: Not on file  Physical Activity: Not on file  Stress: Not on file  Social Connections: Not on file    Review of Systems  Constitutional:  Positive for fatigue. Negative for chills and fever.  HENT:  Positive for congestion. Negative for ear pain and sore throat.   Respiratory:  Positive for cough and shortness of breath.   Cardiovascular:  Negative for chest pain and leg swelling.  Gastrointestinal:  Negative for abdominal pain, constipation, diarrhea, nausea and vomiting.  Genitourinary:  Negative for dysuria and frequency.  Musculoskeletal:  Negative for arthralgias and myalgias.  Neurological:  Positive for headaches. Negative for dizziness.  Psychiatric/Behavioral:  Negative for dysphoric mood. The patient is not nervous/anxious.     Objective:  BP 128/82    Pulse 72    Temp 98.5 F (36.9 C)    Resp 16    Ht 6' (1.829 m)    Wt 194 lb (88 kg)    BMI 26.31 kg/m   BP/Weight 04/17/2021 01/31/2021 34/28/7681  Systolic BP 157 262 035  Diastolic BP 82 84 95  Wt. (Lbs) 194 193 194  BMI 26.31 26.18 26.31    Physical Exam Vitals reviewed.  Constitutional:      Appearance: Normal appearance. He is normal weight.  Cardiovascular:     Rate and Rhythm: Normal rate and regular rhythm.     Pulses: Normal pulses.     Heart sounds: Normal heart sounds.  Pulmonary:     Breath sounds: Normal breath sounds.  Abdominal:     General: Abdomen is flat. Bowel sounds are normal.     Palpations: Abdomen is soft.  Skin:    General: Skin is warm and dry.  Neurological:     Mental Status: He is alert and oriented to person, place, and time.  Psychiatric:        Mood and Affect: Mood normal.        Behavior: Behavior normal.    Diabetic Foot Exam - Simple   No data  filed      Lab Results  Component Value Date   WBC 9.5 01/03/2021   HGB 15.5 01/03/2021   HCT 47.6 01/03/2021   PLT 245 01/03/2021   GLUCOSE 84 01/03/2021   CHOL 148 01/03/2021   TRIG 107 01/03/2021   HDL  57 01/03/2021   LDLCALC 72 01/03/2021   ALT 28 01/03/2021   AST 33 01/03/2021   NA 141 01/03/2021   K 4.6 01/03/2021   CL 102 01/03/2021   CREATININE 1.16 01/03/2021   BUN 18 01/03/2021   CO2 21 01/03/2021   TSH 3.150 09/25/2020   INR 1.0 12/03/2020      Assessment & Plan:   Problem List Items Addressed This Visit       Cardiovascular and Mediastinum   Hypertension    Well controlled.  No changes to medicines.  Continue to work on eating a healthy diet and exercise.  Labs drawn today.          Respiratory   OSA (obstructive sleep apnea)    Recommend pt consider cpap.  Discussed OSA. Educated.         Digestive   GERD (gastroesophageal reflux disease)    Continue omeprazole        Other   Hyperlipidemia    Well controlled.  No changes to medicines.  Continue to work on eating a healthy diet and exercise.  Labs drawn today.        Relevant Orders   Lipid panel   Dyspnea    Follow-up with Dr. Raeanne Barry      Paraesophageal hernia    Continue omeprazole.      Other Visit Diagnoses     Essential hypertension    -  Primary   Relevant Orders   CBC with Differential/Platelet   Comprehensive metabolic panel     .  No orders of the defined types were placed in this encounter.   Orders Placed This Encounter  Procedures   CBC with Differential/Platelet   Comprehensive metabolic panel   Lipid panel     Follow-up: Return in about 6 months (around 10/15/2021) for chronic fasting.  An After Visit Summary was printed and given to the patient.  [name], MD [facility] [phone]

## 2021-04-17 ENCOUNTER — Encounter: Payer: Self-pay | Admitting: Family Medicine

## 2021-04-17 ENCOUNTER — Ambulatory Visit (INDEPENDENT_AMBULATORY_CARE_PROVIDER_SITE_OTHER): Payer: Medicare HMO | Admitting: Family Medicine

## 2021-04-17 ENCOUNTER — Other Ambulatory Visit: Payer: Self-pay

## 2021-04-17 VITALS — BP 128/82 | HR 72 | Temp 98.5°F | Resp 16 | Ht 72.0 in | Wt 194.0 lb

## 2021-04-17 DIAGNOSIS — I10 Essential (primary) hypertension: Secondary | ICD-10-CM

## 2021-04-17 DIAGNOSIS — R0609 Other forms of dyspnea: Secondary | ICD-10-CM | POA: Diagnosis not present

## 2021-04-17 DIAGNOSIS — K449 Diaphragmatic hernia without obstruction or gangrene: Secondary | ICD-10-CM

## 2021-04-17 DIAGNOSIS — G4733 Obstructive sleep apnea (adult) (pediatric): Secondary | ICD-10-CM | POA: Diagnosis not present

## 2021-04-17 DIAGNOSIS — E782 Mixed hyperlipidemia: Secondary | ICD-10-CM | POA: Diagnosis not present

## 2021-04-17 DIAGNOSIS — K5904 Chronic idiopathic constipation: Secondary | ICD-10-CM

## 2021-04-17 DIAGNOSIS — K219 Gastro-esophageal reflux disease without esophagitis: Secondary | ICD-10-CM | POA: Diagnosis not present

## 2021-04-17 NOTE — Assessment & Plan Note (Signed)
Follow-up with Dr. Raeanne Barry

## 2021-04-17 NOTE — Assessment & Plan Note (Signed)
Continue omeprazole 

## 2021-04-17 NOTE — Assessment & Plan Note (Signed)
Recommend pt consider cpap.  Discussed OSA. Educated.

## 2021-04-17 NOTE — Assessment & Plan Note (Signed)
Well controlled.  ?No changes to medicines.  ?Continue to work on eating a healthy diet and exercise.  ?Labs drawn today.  ?

## 2021-04-18 LAB — CBC WITH DIFFERENTIAL/PLATELET
Basophils Absolute: 0 10*3/uL (ref 0.0–0.2)
Basos: 1 %
EOS (ABSOLUTE): 0.2 10*3/uL (ref 0.0–0.4)
Eos: 4 %
Hematocrit: 44.5 % (ref 37.5–51.0)
Hemoglobin: 14.6 g/dL (ref 13.0–17.7)
Immature Grans (Abs): 0 10*3/uL (ref 0.0–0.1)
Immature Granulocytes: 0 %
Lymphocytes Absolute: 1.3 10*3/uL (ref 0.7–3.1)
Lymphs: 22 %
MCH: 27.7 pg (ref 26.6–33.0)
MCHC: 32.8 g/dL (ref 31.5–35.7)
MCV: 84 fL (ref 79–97)
Monocytes Absolute: 0.9 10*3/uL (ref 0.1–0.9)
Monocytes: 15 %
Neutrophils Absolute: 3.6 10*3/uL (ref 1.4–7.0)
Neutrophils: 58 %
Platelets: 239 10*3/uL (ref 150–450)
RBC: 5.28 x10E6/uL (ref 4.14–5.80)
RDW: 15.1 % (ref 11.6–15.4)
WBC: 6.1 10*3/uL (ref 3.4–10.8)

## 2021-04-18 LAB — COMPREHENSIVE METABOLIC PANEL
ALT: 32 IU/L (ref 0–44)
AST: 38 IU/L (ref 0–40)
Albumin/Globulin Ratio: 1.8 (ref 1.2–2.2)
Albumin: 4.7 g/dL (ref 3.8–4.8)
Alkaline Phosphatase: 137 IU/L — ABNORMAL HIGH (ref 44–121)
BUN/Creatinine Ratio: 10 (ref 10–24)
BUN: 12 mg/dL (ref 8–27)
Bilirubin Total: 0.5 mg/dL (ref 0.0–1.2)
CO2: 21 mmol/L (ref 20–29)
Calcium: 9.9 mg/dL (ref 8.6–10.2)
Chloride: 104 mmol/L (ref 96–106)
Creatinine, Ser: 1.23 mg/dL (ref 0.76–1.27)
Globulin, Total: 2.6 g/dL (ref 1.5–4.5)
Glucose: 91 mg/dL (ref 70–99)
Potassium: 4.1 mmol/L (ref 3.5–5.2)
Sodium: 142 mmol/L (ref 134–144)
Total Protein: 7.3 g/dL (ref 6.0–8.5)
eGFR: 64 mL/min/{1.73_m2} (ref >59)

## 2021-04-18 LAB — LIPID PANEL
Chol/HDL Ratio: 2.5 ratio (ref 0.0–5.0)
Cholesterol, Total: 141 mg/dL (ref 100–199)
HDL: 57 mg/dL (ref >39)
LDL Chol Calc (NIH): 66 mg/dL (ref 0–99)
Triglycerides: 99 mg/dL (ref 0–149)
VLDL Cholesterol Cal: 18 mg/dL (ref 5–40)

## 2021-04-18 LAB — CARDIOVASCULAR RISK ASSESSMENT

## 2021-04-23 ENCOUNTER — Other Ambulatory Visit: Payer: Self-pay

## 2021-04-23 ENCOUNTER — Ambulatory Visit: Payer: Medicare HMO | Admitting: Pulmonary Disease

## 2021-04-23 VITALS — BP 142/84 | HR 91 | Temp 97.8°F | Ht 72.0 in | Wt 198.2 lb

## 2021-04-23 DIAGNOSIS — R942 Abnormal results of pulmonary function studies: Secondary | ICD-10-CM | POA: Diagnosis not present

## 2021-04-23 DIAGNOSIS — R0602 Shortness of breath: Secondary | ICD-10-CM

## 2021-04-23 NOTE — Patient Instructions (Addendum)
Stop Advair inhaler and monitor for any worsening in your breathing with cough, wheezing, shortness of breath  Will follow up on the kidney doctor report later this month.   We can consider referral to neuromuscular specialist for further evaluation  I will reach out to Dr. Tobie Poet regarding initiating antidepressant/antianxiety medication.   Follow up in 6 months with repeat pulmonary function tests.

## 2021-04-23 NOTE — Progress Notes (Signed)
Synopsis: Referred in February 2022 by [name], MD for dyspnea on exertion.  Subjective:   PATIENT ID: Joe Arroyo GENDER: male DOB: [birth year], MRN: [id number]  HPI  Chief Complaint  Patient presents with   Follow-up    F/U for SOB. Recent CT in October. Does not feel the Advair as helped much. PFT 10/25.    Joe Arroyo is a 66 year old male, never smoker with hypertension, hyperlipidemia and moderate obstructive sleep apnea who returns to pulmonary clinic for evaluation of dyspnea.   He continues to experience shortness of breath at rest and with exertion over the past year. The shortness of breath does not appear any better or worse at this time. He had hiatal hernia repair surgery 12/05/20 with significant improvement in his reflux symptoms but no improvement in his dyspnea.   He has not noticed improvement in his breathing with ICS/LABA therapy.   He has been evaluated by ENT for vocal cord dysfunction which was not noted on there flexible laryngoscopy evaluation.   He has been evaluated by cardiology with no distinct cardiac etiology to his dyspnea.  BNP was not elevated. ANA, RF, ESR, CCP and CRP are not elevated. CBC remains within normal limits. TSH is normal. Sniff test shows normal diaphragmatic function, no paralysis present. PFTs on 07/12/20 were within normal limits although he had an isolated decrease in his FVC that was mild. Repeat PFTs on 02/05/21 show TLC of 76% from 83% and DLCO 63% from 91%.   HRCT Chest 02/21/21 did not show any parenchymal changes from 06/2020 from his previous HRCT Chest. The left lower lobe compressive atelectasis improved after his hiatal hernia surgery.   ABG from 11/2020 showed pH 7.45, pCO2 32, pO2 89 and bicarb 22. Serial BMPs have been notable for low to low normal levels of serum bicarb over the last year.   He was diagnosed with moderate obstructive sleep apnea with an AHI of 15.6/hr from a home sleep study on 03/01/20. He has not  wanted to try CPAP therapy.  Past Medical History:  Diagnosis Date   Dyspnea    History of hiatal hernia    History of kidney stones    Hyperlipidemia 05/14/2018   Hypertension 05/14/2018   Kidney stones    Sinus bradycardia 05/14/2018   Sleep apnea    moderate OSA 02/20/20 HST   Thoracic ascending aortic aneurysm    ascending TAA 4.6 x 4.4 cm 10/03/20 CTA chest     Family History  Problem Relation Age of Onset   Hyperlipidemia Mother    Aortic stenosis Mother    Valvular heart disease Mother    Colon cancer Mother    Diabetes Mellitus II Mother    Cancer - Colon Mother    Cancer Mother    Stroke Mother    Cerebrovascular Accident Mother    Diabetes Mother    Hypertension Mother    Hyperlipidemia Father    Diabetes Mellitus II Father    Diabetes Father    Esophageal cancer Paternal Uncle    CAD Maternal Grandmother    Congestive Heart Failure Maternal Grandmother    Heart attack Maternal Grandmother    Heart failure Maternal Grandmother    Coronary artery disease Maternal Grandmother    Hyperlipidemia Maternal Grandmother      Social History   Socioeconomic History   Marital status: Single    Spouse name: Not on file   Number of children: Not on file   Years of  education: Not on file   Highest education level: Not on file  Occupational History   Not on file  Tobacco Use   Smoking status: Never   Smokeless tobacco: Never  Vaping Use   Vaping Use: Never used  Substance and Sexual Activity   Alcohol use: Yes    Comment: occ   Drug use: Never   Sexual activity: Not on file  Other Topics Concern   Not on file  Social History Narrative   Not on file   Social Determinants of Health   Financial Resource Strain: Not on file  Food Insecurity: Not on file  Transportation Needs: Not on file  Physical Activity: Not on file  Stress: Not on file  Social Connections: Not on file  Intimate Partner Violence: Not on file     Allergies  Allergen Reactions    Bystolic [Nebivolol Hcl] Other (See Comments)    bradycardia   Carvedilol Other (See Comments)    bradycardia   Pravastatin Other (See Comments)    "made my joints hurt"   Hctz [Hydrochlorothiazide] Rash   Hydralazine Rash   Penicillins Rash     Outpatient Medications Prior to Visit  Medication Sig Dispense Refill   amLODipine (NORVASC) 5 MG tablet Take 1 tablet (5 mg total) by mouth daily. 90 tablet 0   cloNIDine (CATAPRES) 0.3 MG tablet Take 1 tablet (0.3 mg total) by mouth daily. 90 tablet 1   esomeprazole (NEXIUM) 20 MG capsule Take by mouth daily at 12 noon. OTC     ezetimibe (ZETIA) 10 MG tablet Take 1 tablet (10 mg total) by mouth daily. 90 tablet 1   finasteride (PROSCAR) 5 MG tablet Take 5 mg by mouth daily.     fluticasone-salmeterol (ADVAIR HFA) 230-21 MCG/ACT inhaler Inhale 2 puffs into the lungs 2 (two) times daily. 1 each 12   meloxicam (MOBIC) 15 MG tablet Take 1 tablet (15 mg total) by mouth daily. Take one tablet daily as needed 90 tablet 0   Multiple Vitamins-Minerals (CENTRUM SILVER ULTRA MENS PO) Take 1 tablet by mouth daily.     Omega-3 Fatty Acids (FISH OIL PO) Take 1 tablet by mouth 2 (two) times daily.     rosuvastatin (CRESTOR) 10 MG tablet Take 1 tablet (10 mg total) by mouth at bedtime. 100 tablet 0   No facility-administered medications prior to visit.    Review of Systems  Constitutional:  Negative for chills, fever, malaise/fatigue and weight loss.  HENT:  Negative for congestion, sinus pain and sore throat.   Eyes: Negative.   Respiratory:  Positive for shortness of breath. Negative for cough, hemoptysis, sputum production and wheezing.   Cardiovascular:  Negative for chest pain, palpitations, orthopnea, claudication and leg swelling.  Gastrointestinal:  Negative for abdominal pain, heartburn, nausea and vomiting.  Genitourinary: Negative.   Musculoskeletal:  Negative for joint pain and myalgias.  Skin:  Negative for rash.  Neurological:  Negative  for weakness.  Endo/Heme/Allergies: Negative.   Psychiatric/Behavioral: Negative.     Objective:   Vitals:   04/23/21 1007  BP: (!) 142/84  Pulse: 91  Temp: 97.8 F (36.6 C)  TempSrc: Oral  SpO2: 99%  Weight: 198 lb 3.2 oz (89.9 kg)  Height: 6' (1.829 m)     Physical Exam Constitutional:      General: He is not in acute distress. HENT:     Head: Normocephalic and atraumatic.  Eyes:     Extraocular Movements: Extraocular movements intact.  Conjunctiva/sclera: Conjunctivae normal.     Pupils: Pupils are equal, round, and reactive to light.  Cardiovascular:     Rate and Rhythm: Normal rate and regular rhythm.     Pulses: Normal pulses.     Heart sounds: Normal heart sounds. No murmur heard. Pulmonary:     Breath sounds: No stridor or transmitted upper airway sounds. Rales (rales, left base) present. No wheezing or rhonchi.  Abdominal:     General: Bowel sounds are normal.     Palpations: Abdomen is soft.  Musculoskeletal:     Right lower leg: No edema.     Left lower leg: No edema.  Lymphadenopathy:     Cervical: No cervical adenopathy.  Skin:    General: Skin is warm and dry.  Neurological:     General: No focal deficit present.     Mental Status: He is alert.  Psychiatric:        Mood and Affect: Mood normal.        Behavior: Behavior normal.        Thought Content: Thought content normal.        Judgment: Judgment normal.    CBC    Component Value Date/Time   WBC 6.1 04/17/2021 0944   WBC 9.0 12/07/2020 0114   RBC 5.28 04/17/2021 0944   RBC 4.67 12/07/2020 0114   HGB 14.6 04/17/2021 0944   HCT 44.5 04/17/2021 0944   PLT 239 04/17/2021 0944   MCV 84 04/17/2021 0944   MCH 27.7 04/17/2021 0944   MCH 28.3 12/07/2020 0114   MCHC 32.8 04/17/2021 0944   MCHC 32.4 12/07/2020 0114   RDW 15.1 04/17/2021 0944   LYMPHSABS 1.3 04/17/2021 0944   MONOABS 0.8 06/04/2020 1033   EOSABS 0.2 04/17/2021 0944   BASOSABS 0.0 04/17/2021 0944   BMP Latest Ref Rng  & Units 04/17/2021 01/03/2021 12/07/2020  Glucose 70 - 99 mg/dL 91 84 140(H)  BUN 8 - 27 mg/dL _0 Creatinine 0.76 - 1.27 mg/dL 1.23 1.16 1.28(H)  BUN/Creat Ratio 10 - _1 -  Sodium 134 - 144 mmol/L 142 141 136  Potassium 3.5 - 5.2 mmol/L 4.1 4.6 3.4(L)  Chloride 96 - 106 mmol/L 104 102 105  CO2 20 - 29 mmol/L _2 Calcium 8.6 - 10.2 mg/dL 9.9 10.2 8.7(L)   Chest imaging: HRCT Chest 03/03/21 Mediastinum/Nodes: No enlarged mediastinal, hilar, or axillary lymph nodes. Small hiatal hernia. Thyroid gland, trachea, and esophagus demonstrate no significant findings.  Lungs/Pleura: No evidence of fibrotic interstitial lung disease. Similar appearance of mild, bland appearing bandlike scarring of the bilateral lung bases. Mild, lobular air trapping on expiratory phase imaging. No pleural effusion or pneumothorax.   CTA Chest 10/03/20 Minimal, bland appearing scarring or partial atelectasis in bilateral lung bases, including compressive atelectasis of the left lung base secondary to hiatal hernia.  - Ascending thoracic aortic aneurysm, 4.6 x 4.4cm  DG Sniff Test 08/13/20 No evidence for hemidiaphragmatic paralysis. Normal bilateral hemidiaphragmatic excursion. Hiatal hernia again noted.   HRCT Chest 06/21/20 - No evidence of interstitial lung disease. Mild air trapping is indicative of small airways disease. -  Large hiatal hernia.  PFT: PFT Results Latest Ref Rng & Units 02/05/2021 07/12/2020  FVC-Pre L 3.46 3.56  FVC-Predicted Pre % 70 72  FVC-Post L 2.88 3.82  FVC-Predicted Post % 59 77  Pre FEV1/FVC % % 82 81  Post FEV1/FCV % % 89 85  FEV1-Pre L 2.82 2.89  FEV1-Predicted Pre % 77 78  FEV1-Post L 2.57 3.24  DLCO uncorrected ml/min/mmHg 18.41 22.98  DLCO UNC% % 65 81  DLCO corrected ml/min/mmHg 17.97 25.74  DLCO COR %Predicted % 63 91  DLVA Predicted % 92 112  TLC L 5.65 6.19  TLC % Predicted % 76 83  RV % Predicted % 93 90  2022: Within normal limits  Echo  05/11/20:  1. Left ventricular ejection fraction, by estimation, is 60 to 65%. The  left ventricle has normal function. The left ventricle has no regional  wall motion abnormalities. There is mild left ventricular hypertrophy.  Left ventricular diastolic parameters  are consistent with Grade I diastolic dysfunction (impaired relaxation).   2. Right ventricular systolic function is normal. The right ventricular  size is normal.   3. The mitral valve is normal in structure. No evidence of mitral valve  regurgitation. No evidence of mitral stenosis.   4. The aortic valve is normal in structure. There is mild calcification  of the aortic valve. There is mild thickening of the aortic valve. Aortic  valve regurgitation is mild. No aortic stenosis is present.   5. There is moderate dilatation of the ascending aorta, measuring 45 mm.   6. The inferior vena cava is normal in size with greater than 50%  respiratory variability, suggesting right atrial pressure of 3 mmHg.    Assessment & Plan:   Shortness of breath  Restrictive ventilatory defect - Plan: Pulmonary Function Test  Decreased diffusion capacity of lung - Plan: Pulmonary Function Test  Discussion: Joe Arroyo is a 66 year old male, never smoker with hypertension, hyperlipidemia and moderate obstructive sleep apnea who returns to pulmonary clinic for evaluation of dyspnea.   The etiology of his dyspnea continues to remain unclear at this time. Differential includes hyperventilation syndrome vs possible renal tubular acidosis vs neuromuscular disorder vs pulmonary vascular disorder.  His recent HRCT chest does not indicate interstitial lung disease consistent with his PFT findings of mild restrictive defect and mild diffusion defect. The decrease in TLC and DLCO may have been related to effort during the study. There is less concern for pulmonary hypertension based on TTE and caliber of pulmonary artery on CTA chest. He is not anemic.  Sniff test is unremarkable.   Hyperventilation syndrome is of concern based on his ABG from 8/22 with pH 7.45, pCO2 32, pO2 89 and bicarb 22. He denies any excessive stress or issues with anxiety. He is open to trial of SSRI at this time so I will notify his PCP for initiation of this treatment.  On review of BMPs, serial bicarb levels have ranged down to 20 to 22mol/L which raises the question for any renal wasting cause to his low bicarb level. He has nephrology consult on 05/03/20.  He is to stop advair inhaler as he has not noticed benefit and monitor for any worsening of symptoms.   Follow up in 6 months with repeat PFTs.  JFreda Jackson MD [facility] Office: [phone]    Current Outpatient Medications:    amLODipine (NORVASC) 5 MG tablet, Take 1 tablet (5 mg total) by mouth daily., Disp: 90 tablet, Rfl: 0   cloNIDine (CATAPRES) 0.3 MG tablet, Take 1 tablet (0.3 mg total) by mouth daily., Disp: 90 tablet, Rfl: 1   esomeprazole (NEXIUM) 20 MG capsule, Take by mouth daily at 12 noon. OTC, Disp: , Rfl:    ezetimibe (ZETIA) 10 MG tablet, Take 1 tablet (10 mg  total) by mouth daily., Disp: 90 tablet, Rfl: 1   finasteride (PROSCAR) 5 MG tablet, Take 5 mg by mouth daily., Disp: , Rfl:    fluticasone-salmeterol (ADVAIR HFA) 230-21 MCG/ACT inhaler, Inhale 2 puffs into the lungs 2 (two) times daily., Disp: 1 each, Rfl: 12   meloxicam (MOBIC) 15 MG tablet, Take 1 tablet (15 mg total) by mouth daily. Take one tablet daily as needed, Disp: 90 tablet, Rfl: 0   Multiple Vitamins-Minerals (CENTRUM SILVER ULTRA MENS PO), Take 1 tablet by mouth daily., Disp: , Rfl:    Omega-3 Fatty Acids (FISH OIL PO), Take 1 tablet by mouth 2 (two) times daily., Disp: , Rfl:    rosuvastatin (CRESTOR) 10 MG tablet, Take 1 tablet (10 mg total) by mouth at bedtime., Disp: 100 tablet, Rfl: 0

## 2021-04-24 ENCOUNTER — Encounter: Payer: Self-pay | Admitting: Pulmonary Disease

## 2021-04-24 DIAGNOSIS — Z87442 Personal history of urinary calculi: Secondary | ICD-10-CM | POA: Insufficient documentation

## 2021-04-24 DIAGNOSIS — Z8719 Personal history of other diseases of the digestive system: Secondary | ICD-10-CM | POA: Insufficient documentation

## 2021-04-24 DIAGNOSIS — I7121 Aneurysm of the ascending aorta, without rupture: Secondary | ICD-10-CM | POA: Insufficient documentation

## 2021-04-26 ENCOUNTER — Ambulatory Visit: Payer: Medicare HMO | Admitting: Cardiology

## 2021-04-26 ENCOUNTER — Other Ambulatory Visit: Payer: Self-pay

## 2021-04-26 ENCOUNTER — Encounter: Payer: Self-pay | Admitting: Cardiology

## 2021-04-26 VITALS — BP 160/110 | HR 76 | Ht 72.0 in | Wt 201.0 lb

## 2021-04-26 DIAGNOSIS — I10 Essential (primary) hypertension: Secondary | ICD-10-CM

## 2021-04-26 DIAGNOSIS — I7121 Aneurysm of the ascending aorta, without rupture: Secondary | ICD-10-CM

## 2021-04-26 DIAGNOSIS — R0602 Shortness of breath: Secondary | ICD-10-CM | POA: Diagnosis not present

## 2021-04-26 DIAGNOSIS — E782 Mixed hyperlipidemia: Secondary | ICD-10-CM | POA: Diagnosis not present

## 2021-04-26 DIAGNOSIS — I1A0 Resistant hypertension: Secondary | ICD-10-CM

## 2021-04-26 MED ORDER — LOSARTAN POTASSIUM 50 MG PO TABS: 50.0000 mg | ORAL_TABLET | Freq: Every day | ORAL | 3 refills | Status: DC

## 2021-04-26 MED ORDER — EPLERENONE 25 MG PO TABS: 25.0000 mg | ORAL_TABLET | Freq: Every day | ORAL | 3 refills | Status: DC

## 2021-04-26 NOTE — Progress Notes (Signed)
Cardiology Office Note:    Date:  04/26/2021   ID:  Joe Arroyo, DOB September 17, 1955, MRN [id number]  PCP:  [name], MD  Cardiologist:  Shirlee More, MD    Referring MD: [name], MD    ASSESSMENT:    1. Resistant hypertension   2. Aneurysm of ascending aorta without rupture   3. Mixed hyperlipidemia   4. Shortness of breath    PLAN:    In order of problems listed above:  He is quite hypertensive today intolerant of thiazide diuretic we will try distal diuretic low-dose ARB continue his calcium channel blocker centrally active clonidine Will need serial imaging.  If he was imaged in October we can delay until after his next visit stable on 2 imaging March and June 2022 Continue with statin No findings of heart failure   Next appointment: 6 weeks   Medication Adjustments/Labs and Tests Ordered: Current medicines are reviewed at length with the patient today.  Concerns regarding medicines are outlined above.  Orders Placed This Encounter  Procedures   Basic metabolic panel   No orders of the defined types were placed in this encounter.   Chief Complaint  Patient presents with   Follow-up    History of Present Illness:    Joe Arroyo is a 66 y.o. male with a hx of resistant hypertension obstructive sleep apnea and hyperlipidemia last seen 10/08/2020 for complaints of shortness of breath.  His proBNP level was quite low at 67 echocardiogram showed EF 60 to 65% with normal filling pressures he had moderate enlargement ascending aorta 47 mm and mild aortic regurgitation.  Further evaluation showed no evidence of diaphragmatic paralysis he has been seen by pulmonary and treated with bronchodilator and a PPI.  [facility] at [facility] October 03, 2020 ascending aorta 46.44 mm unchanged from March the same year.  No large hiatal hernia and compressive atelectasis in the left lung.  Compliance with diet, lifestyle and medications: Yes  Home blood pressures been running  145/85 he checks morning to ask for BP meds. Previously hydrochlorothiazide caused rash telmisartan caused symptomatic hypotension He continues to have exertional shortness of breath tells me he had another CT of his chest done in October I do not have a copy we will try to obtain No edema orthopnea chest pain syncope  78 had CTA chest 02/13/2021 unchanged ascending aorta with delay 6 months and has mild scarring of the lung bases Past Medical History:  Diagnosis Date   Dyspnea    History of hiatal hernia    History of kidney stones    Hyperlipidemia 05/14/2018   Hypertension 05/14/2018   Kidney stones    Sinus bradycardia 05/14/2018   Sleep apnea    moderate OSA 02/20/20 HST   Thoracic ascending aortic aneurysm    ascending TAA 4.6 x 4.4 cm 10/03/20 CTA chest    Past Surgical History:  Procedure Laterality Date   COLONOSCOPY     ESOPHAGOGASTRODUODENOSCOPY N/A 12/05/2020   Procedure: ESOPHAGOGASTRODUODENOSCOPY (EGD);  Surgeon: Lajuana Matte, MD;  Location: [facility] OR;  Service: Thoracic;  Laterality: N/A;   LAMINECTOMY     MOLE REMOVAL     PROSTATE BIOPSY     TONSILLECTOMY AND ADENOIDECTOMY     UPPER GASTROINTESTINAL ENDOSCOPY     XI ROBOTIC ASSISTED PARAESOPHAGEAL HERNIA REPAIR N/A 12/05/2020   Procedure: XI ROBOTIC ASSISTED PARAESOPHAGEAL HERNIA REPAIR AND FUNDOPLICATION;  Surgeon: Lajuana Matte, MD;  Location: [place];  Service: Thoracic;  Laterality: N/A;  Current Medications: Current Meds  Medication Sig   amLODipine (NORVASC) 5 MG tablet Take 1 tablet (5 mg total) by mouth daily.   cloNIDine (CATAPRES) 0.3 MG tablet Take 1 tablet (0.3 mg total) by mouth daily.   esomeprazole (NEXIUM) 20 MG capsule Take by mouth daily at 12 noon. OTC   ezetimibe (ZETIA) 10 MG tablet Take 1 tablet (10 mg total) by mouth daily.   finasteride (PROSCAR) 5 MG tablet Take 5 mg by mouth daily.   meloxicam (MOBIC) 15 MG tablet Take 15 mg by mouth daily as needed for pain.   Multiple  Vitamins-Minerals (CENTRUM SILVER ULTRA MENS PO) Take 1 tablet by mouth daily.   Omega-3 Fatty Acids (FISH OIL PO) Take 1 tablet by mouth 2 (two) times daily.   rosuvastatin (CRESTOR) 10 MG tablet Take 1 tablet (10 mg total) by mouth at bedtime.     Allergies:   Bystolic [nebivolol hcl], Carvedilol, Pravastatin, Hctz [hydrochlorothiazide], Hydralazine, and Penicillins   Social History   Socioeconomic History   Marital status: Single    Spouse name: Not on file   Number of children: Not on file   Years of education: Not on file   Highest education level: Not on file  Occupational History   Not on file  Tobacco Use   Smoking status: Never   Smokeless tobacco: Never  Vaping Use   Vaping Use: Never used  Substance and Sexual Activity   Alcohol use: Yes    Comment: occ   Drug use: Never   Sexual activity: Not on file  Other Topics Concern   Not on file  Social History Narrative   Not on file   Social Determinants of Health   Financial Resource Strain: Not on file  Food Insecurity: Not on file  Transportation Needs: Not on file  Physical Activity: Not on file  Stress: Not on file  Social Connections: Not on file     Family History: The patient's family history includes Aortic stenosis in his mother; CAD in his maternal grandmother; Cancer in his mother; Cancer - Colon in his mother; Cerebrovascular Accident in his mother; Colon cancer in his mother; Congestive Heart Failure in his maternal grandmother; Coronary artery disease in his maternal grandmother; Diabetes in his father and mother; Diabetes Mellitus II in his father and mother; Esophageal cancer in his paternal uncle; Heart attack in his maternal grandmother; Heart failure in his maternal grandmother; Hyperlipidemia in his father, maternal grandmother, and mother; Hypertension in his mother; Stroke in his mother; Valvular heart disease in his mother. ROS:   Please see the history of present illness.    All other systems  reviewed and are negative.  EKGs/Labs/Other Studies Reviewed:    The following studies were reviewed today:    Recent Labs: 05/11/2020: NT-Pro BNP 67 09/25/2020: TSH 3.150 09/28/2020: Brain Natriuretic Peptide 30 04/17/2021: ALT 32; BUN 12; Creatinine, Ser 1.23; Hemoglobin 14.6; Platelets 239; Potassium 4.1; Sodium 142  Recent Lipid Panel    Component Value Date/Time   CHOL 141 04/17/2021 0944   TRIG 99 04/17/2021 0944   HDL 57 04/17/2021 0944   CHOLHDL 2.5 04/17/2021 0944   LDLCALC 66 04/17/2021 0944    Physical Exam:    VS:  BP (!) 160/110 (BP Location: Right Arm, Patient Position: Sitting, Cuff Size: Normal)    Pulse 76    Ht 6' (1.829 m)    Wt 201 lb (91.2 kg)    SpO2 98%    BMI 27.26 kg/m  Wt Readings from Last 3 Encounters:  04/26/21 201 lb (91.2 kg)  04/23/21 198 lb 3.2 oz (89.9 kg)  04/17/21 194 lb (88 kg)     GEN:  Well nourished, well developed in no acute distress HEENT: Normal NECK: No JVD; No carotid bruits LYMPHATICS: No lymphadenopathy CARDIAC: RRR, no murmurs, rubs, gallops RESPIRATORY:  Clear to auscultation without rales, wheezing or rhonchi  ABDOMEN: Soft, non-tender, non-distended MUSCULOSKELETAL:  No edema; No deformity  SKIN: Warm and dry NEUROLOGIC:  Alert and oriented x 3 PSYCHIATRIC:  Normal affect    Signed, Shirlee More, MD  04/26/2021 4:03 PM    [facility]

## 2021-04-26 NOTE — Patient Instructions (Signed)
Medication Instructions:  Your physician has recommended you make the following change in your medication:  START: Inspra 25 mg take one tablet by mouth daily.  START: Losartan 50 mg take one tablet by mouth daily.  *If you need a refill on your cardiac medications before your next appointment, please call your pharmacy*   Lab Work: Your physician recommends that you return for lab work in: TODAY BMP If you have labs (blood work) drawn today and your tests are completely normal, you will receive your results only by: [place] (if you have MyChart) OR A paper copy in the mail If you have any lab test that is abnormal or we need to change your treatment, we will call you to review the results.   Testing/Procedures: None   Follow-Up: At [facility], you and your health needs are our priority.  As part of our continuing mission to provide you with exceptional heart care, we have created designated Provider Care Teams.  These Care Teams include your primary Cardiologist (physician) and Advanced Practice Providers (APPs -  Physician Assistants and Nurse Practitioners) who all work together to provide you with the care you need, when you need it.  We recommend signing up for the patient portal called "MyChart".  Sign up information is provided on this After Visit Summary.  MyChart is used to connect with patients for Virtual Visits (Telemedicine).  Patients are able to view lab/test results, encounter notes, upcoming appointments, etc.  Non-urgent messages can be sent to your provider as well.   To learn more about what you can do with MyChart, go to [email or web address].    Your next appointment:   6 week(s)  The format for your next appointment:   In Person  Provider:   Shirlee More, MD    Other Instructions

## 2021-04-27 LAB — BASIC METABOLIC PANEL
BUN/Creatinine Ratio: 14 (ref 10–24)
BUN: 15 mg/dL (ref 8–27)
CO2: 22 mmol/L (ref 20–29)
Calcium: 10 mg/dL (ref 8.6–10.2)
Chloride: 102 mmol/L (ref 96–106)
Creatinine, Ser: 1.1 mg/dL (ref 0.76–1.27)
Glucose: 75 mg/dL (ref 70–99)
Potassium: 4.1 mmol/L (ref 3.5–5.2)
Sodium: 141 mmol/L (ref 134–144)
eGFR: 74 mL/min/{1.73_m2} (ref >59)

## 2021-05-03 DIAGNOSIS — E785 Hyperlipidemia, unspecified: Secondary | ICD-10-CM | POA: Diagnosis not present

## 2021-05-03 DIAGNOSIS — I129 Hypertensive chronic kidney disease with stage 1 through stage 4 chronic kidney disease, or unspecified chronic kidney disease: Secondary | ICD-10-CM | POA: Diagnosis not present

## 2021-05-03 DIAGNOSIS — N2589 Other disorders resulting from impaired renal tubular function: Secondary | ICD-10-CM | POA: Diagnosis not present

## 2021-05-03 DIAGNOSIS — R0602 Shortness of breath: Secondary | ICD-10-CM | POA: Diagnosis not present

## 2021-05-03 DIAGNOSIS — N1831 Chronic kidney disease, stage 3a: Secondary | ICD-10-CM | POA: Diagnosis not present

## 2021-05-03 DIAGNOSIS — N200 Calculus of kidney: Secondary | ICD-10-CM | POA: Diagnosis not present

## 2021-05-08 ENCOUNTER — Other Ambulatory Visit: Payer: Self-pay

## 2021-05-08 DIAGNOSIS — I10 Essential (primary) hypertension: Secondary | ICD-10-CM

## 2021-05-08 MED ORDER — AMLODIPINE BESYLATE 5 MG PO TABS: 5.0000 mg | ORAL_TABLET | Freq: Every day | ORAL | 0 refills | Status: DC

## 2021-06-09 NOTE — Progress Notes (Signed)
Cardiology Office Note:    Date:  06/10/2021   ID:  Joe Arroyo, DOB 21-Apr-1955, MRN [id number]  PCP:  [name], MD  Cardiologist:  Shirlee More, MD   Referring MD: [name], MD    ASSESSMENT:    1. Resistant hypertension   2. Aneurysm of ascending aorta without rupture   3. Shortness of breath   4. Chest pain of uncertain etiology    PLAN:    In order of problems listed above:  My opinion is hypertension is markedly improved using home measurements he is at target and will continue his current medical regimen including MRA ARB calcium channel blocker and clonidine. We will need follow-up imaging later this year  We will do a myocardial perfusion imaging with the shortness of breath and nonanginal chest pain Continue statin   Next appointment: 6 months   Medication Adjustments/Labs and Tests Ordered: Current medicines are reviewed at length with the patient today.  Concerns regarding medicines are outlined above.  No orders of the defined types were placed in this encounter.  No orders of the defined types were placed in this encounter.   Chief Complaint  Patient presents with   Follow-up   Hypertension    History of Present Illness:    Joe Arroyo is a 66 y.o. male with a hx of resistant hypertension hyperlipidemia and obstructive sleep apnea last seen 04/26/2020.  His  echocardiogram showed EF 60 to 65% with normal filling pressures he had moderate enlargement ascending aorta 47 mm and mild aortic regurgitation.  Further evaluation showed no evidence of diaphragmatic paralysis he has been seen by pulmonary and treated with bronchodilator and a PPI.  CTA at [facility] October 03, 2020 ascending aorta 46.44 mm unchanged from March the same year  Compliance with diet, lifestyle and medications: Yes.  He starts the visit anytime his blood pressure medicines are not working brings me a list and his blood pressure measurements in general have been on target  have been less than 140 and less than 80 on most determinations. He remains short of breath. At times he has chest pain nonexertional substernal in the right chest brief He was advised to start taking sodium bicarb by nephrology he did not really look at his recent labs creatinine 1.10 I told him I would not salt load at this time. He is not having edema orthopnea palpitation or syncope  Past Medical History:  Diagnosis Date   Dyspnea    History of hiatal hernia    History of kidney stones    Hyperlipidemia 05/14/2018   Hypertension 05/14/2018   Kidney stones    Sinus bradycardia 05/14/2018   Sleep apnea    moderate OSA 02/20/20 HST   Thoracic ascending aortic aneurysm    ascending TAA 4.6 x 4.4 cm 10/03/20 CTA chest    Past Surgical History:  Procedure Laterality Date   COLONOSCOPY     ESOPHAGOGASTRODUODENOSCOPY N/A 12/05/2020   Procedure: ESOPHAGOGASTRODUODENOSCOPY (EGD);  Surgeon: Lajuana Matte, MD;  Location: [facility] OR;  Service: Thoracic;  Laterality: N/A;   LAMINECTOMY     MOLE REMOVAL     PROSTATE BIOPSY     TONSILLECTOMY AND ADENOIDECTOMY     UPPER GASTROINTESTINAL ENDOSCOPY     XI ROBOTIC ASSISTED PARAESOPHAGEAL HERNIA REPAIR N/A 12/05/2020   Procedure: XI ROBOTIC ASSISTED PARAESOPHAGEAL HERNIA REPAIR AND FUNDOPLICATION;  Surgeon: Lajuana Matte, MD;  Location: [place];  Service: Thoracic;  Laterality: N/A;    Current Medications: No  outpatient medications have been marked as taking for the 06/10/21 encounter (Office Visit) with Richardo Priest, MD.     Allergies:   Bystolic [nebivolol hcl], Carvedilol, Pravastatin, Hctz [hydrochlorothiazide], Hydralazine, and Penicillins   Social History   Socioeconomic History   Marital status: Single    Spouse name: Not on file   Number of children: Not on file   Years of education: Not on file   Highest education level: Not on file  Occupational History   Not on file  Tobacco Use   Smoking status: Never   Smokeless  tobacco: Never  Vaping Use   Vaping Use: Never used  Substance and Sexual Activity   Alcohol use: Yes    Comment: occ   Drug use: Never   Sexual activity: Not on file  Other Topics Concern   Not on file  Social History Narrative   Not on file   Social Determinants of Health   Financial Resource Strain: Not on file  Food Insecurity: Not on file  Transportation Needs: Not on file  Physical Activity: Not on file  Stress: Not on file  Social Connections: Not on file     Family History: The patient's family history includes Aortic stenosis in his mother; CAD in his maternal grandmother; Cancer in his mother; Cancer - Colon in his mother; Cerebrovascular Accident in his mother; Colon cancer in his mother; Congestive Heart Failure in his maternal grandmother; Coronary artery disease in his maternal grandmother; Diabetes in his father and mother; Diabetes Mellitus II in his father and mother; Esophageal cancer in his paternal uncle; Heart attack in his maternal grandmother; Heart failure in his maternal grandmother; Hyperlipidemia in his father, maternal grandmother, and mother; Hypertension in his mother; Stroke in his mother; Valvular heart disease in his mother. ROS:   Please see the history of present illness.    All other systems reviewed and are negative.  EKGs/Labs/Other Studies Reviewed:    The following studies were reviewed today:   Recent Labs: 09/25/2020: TSH 3.150 09/28/2020: Brain Natriuretic Peptide 30 04/17/2021: ALT 32; Hemoglobin 14.6; Platelets 239 04/26/2021: BUN 15; Creatinine, Ser 1.10; Potassium 4.1; Sodium 141  Recent Lipid Panel    Component Value Date/Time   CHOL 141 04/17/2021 0944   TRIG 99 04/17/2021 0944   HDL 57 04/17/2021 0944   CHOLHDL 2.5 04/17/2021 0944   LDLCALC 66 04/17/2021 0944    Physical Exam:    VS:  BP (!) 160/90 (BP Location: Right Arm, Patient Position: Sitting, Cuff Size: Normal)    Pulse 84    Ht 6' (1.829 m)    Wt 199 lb (90.3 kg)     SpO2 98%    BMI 26.99 kg/m     Wt Readings from Last 3 Encounters:  06/10/21 199 lb (90.3 kg)  04/26/21 201 lb (91.2 kg)  04/23/21 198 lb 3.2 oz (89.9 kg)     GEN:  Well nourished, well developed in no acute distress HEENT: Normal NECK: No JVD; No carotid bruits LYMPHATICS: No lymphadenopathy CARDIAC: RRR, no murmurs, rubs, gallops RESPIRATORY:  Clear to auscultation without rales, wheezing or rhonchi  ABDOMEN: Soft, non-tender, non-distended MUSCULOSKELETAL:  No edema; No deformity  SKIN: Warm and dry NEUROLOGIC:  Alert and oriented x 3 PSYCHIATRIC:  Normal affect    Signed, Shirlee More, MD  06/10/2021 10:59 AM    [place]

## 2021-06-10 ENCOUNTER — Ambulatory Visit: Payer: Medicare HMO | Admitting: Cardiology

## 2021-06-10 ENCOUNTER — Other Ambulatory Visit: Payer: Self-pay

## 2021-06-10 ENCOUNTER — Encounter: Payer: Self-pay | Admitting: Cardiology

## 2021-06-10 VITALS — BP 160/90 | HR 84 | Ht 72.0 in | Wt 199.0 lb

## 2021-06-10 DIAGNOSIS — I10 Essential (primary) hypertension: Secondary | ICD-10-CM | POA: Diagnosis not present

## 2021-06-10 DIAGNOSIS — I7121 Aneurysm of the ascending aorta, without rupture: Secondary | ICD-10-CM | POA: Diagnosis not present

## 2021-06-10 DIAGNOSIS — R0602 Shortness of breath: Secondary | ICD-10-CM | POA: Diagnosis not present

## 2021-06-10 DIAGNOSIS — R079 Chest pain, unspecified: Secondary | ICD-10-CM

## 2021-06-10 NOTE — Addendum Note (Signed)
Addended by: Jerl Santos R on: 06/10/2021 11:10 AM   Modules accepted: Orders

## 2021-06-10 NOTE — Patient Instructions (Signed)
Medication Instructions:  Your physician recommends that you continue on your current medications as directed. Please refer to the Current Medication list given to you today.  *If you need a refill on your cardiac medications before your next appointment, please call your pharmacy*   Lab Work: NONE If you have labs (blood work) drawn today and your tests are completely normal, you will receive your results only by: [place] (if you have MyChart) OR A paper copy in the mail If you have any lab test that is abnormal or we need to change your treatment, we will call you to review the results.   Testing/Procedures: Your physician has requested that you have a lexiscan myoview. For further information please visit [email or web address]. Please follow instruction sheet, as given.   The test will take approximately 3 to 4 hours to complete; you may bring reading material.  If someone comes with you to your appointment, they will need to remain in the main lobby due to limited space in the testing area. **If you are pregnant or breastfeeding, please notify the nuclear lab prior to your appointment**  HOLD ALL ED MEDICATION FOR 48 HOURS PRIOR How to prepare for your Myocardial Perfusion Test: Do not eat or drink 3 hours prior to your test, except you may have water. Do not consume products containing caffeine (regular or decaffeinated) 12 hours prior to your test. (ex: coffee, chocolate, sodas, tea). Do bring a list of your current medications with you.  If not listed below, you may take your medications as normal. Do wear comfortable clothes (no dresses or overalls) and walking shoes, tennis shoes preferred (No heels or open toe shoes are allowed). Do NOT wear cologne, perfume, aftershave, or lotions (deodorant is allowed). If these instructions are not followed, your test will have to be rescheduled.    Follow-Up: At [facility], you and your health needs are our priority.  As part of  our continuing mission to provide you with exceptional heart care, we have created designated Provider Care Teams.  These Care Teams include your primary Cardiologist (physician) and Advanced Practice Providers (APPs -  Physician Assistants and Nurse Practitioners) who all work together to provide you with the care you need, when you need it.  We recommend signing up for the patient portal called "MyChart".  Sign up information is provided on this After Visit Summary.  MyChart is used to connect with patients for Virtual Visits (Telemedicine).  Patients are able to view lab/test results, encounter notes, upcoming appointments, etc.  Non-urgent messages can be sent to your provider as well.   To learn more about what you can do with MyChart, go to [email or web address].    Your next appointment:   6 month(s)  The format for your next appointment:   In Person  Provider:   Shirlee More, MD    Other Instructions

## 2021-06-10 NOTE — Addendum Note (Signed)
Addended by: Jerl Santos R on: 06/10/2021 11:09 AM   Modules accepted: Orders

## 2021-06-10 NOTE — Addendum Note (Signed)
Addended by: Anselm Pancoast on: 06/10/2021 04:30 PM   Modules accepted: Orders

## 2021-06-11 ENCOUNTER — Telehealth: Payer: Self-pay | Admitting: *Deleted

## 2021-06-11 NOTE — Telephone Encounter (Signed)
Patient given detailed instructions per Myocardial Perfusion Study Information Sheet for the test on 06/18/21 at 1100. Patient notified to arrive 15 minutes early and that it is imperative to arrive on time for appointment to keep from having the test rescheduled.  If you need to cancel or reschedule your appointment, please call the office within 24 hours of your appointment. . Patient verbalized understanding.Dominic Mahaney, Ranae [place] No mychart

## 2021-06-11 NOTE — Addendum Note (Signed)
Addended byShirlee More on: 06/11/2021 07:36 AM   Modules accepted: Orders

## 2021-06-13 ENCOUNTER — Ambulatory Visit (INDEPENDENT_AMBULATORY_CARE_PROVIDER_SITE_OTHER): Payer: Medicare HMO

## 2021-06-13 DIAGNOSIS — Z0000 Encounter for general adult medical examination without abnormal findings: Secondary | ICD-10-CM | POA: Diagnosis not present

## 2021-06-13 NOTE — Progress Notes (Signed)
Subjective:   Joe Arroyo is a 66 y.o. male who presents for Medicare Annual/Subsequent preventive examination.  I connected with  Joe Arroyo on 06/13/21 by a audio enabled telemedicine application and verified that I am speaking with the correct person using two identifiers.  Patient Location: Home  Provider Location: Office/Clinic  I discussed the limitations of evaluation and management by telemedicine. The patient expressed understanding and agreed to proceed.  Cardiac Risk Factors include: advanced age (>59men, >44 women);male gender     Objective:    There were no vitals filed for this visit. There is no height or weight on file to calculate BMI.  Advanced Directives 06/13/2021 12/03/2020  Does Patient Have a Medical Advance Directive? Yes Yes  Type of Paramedic of [place];Living will [place];Living will  Does patient want to make changes to medical advance directive? No - Patient declined No - Patient declined  Copy of [place] in Chart? Yes - validated most recent copy scanned in chart (See row information) No - copy requested    Current Medications (verified) Outpatient Encounter Medications as of 06/13/2021  Medication Sig   amLODipine (NORVASC) 5 MG tablet Take 1 tablet (5 mg total) by mouth daily.   cloNIDine (CATAPRES) 0.3 MG tablet Take 1 tablet (0.3 mg total) by mouth daily.   eplerenone (INSPRA) 25 MG tablet Take 1 tablet (25 mg total) by mouth daily.   esomeprazole (NEXIUM) 20 MG capsule Take by mouth daily at 12 noon. OTC   ezetimibe (ZETIA) 10 MG tablet Take 1 tablet (10 mg total) by mouth daily.   finasteride (PROSCAR) 5 MG tablet Take 5 mg by mouth daily.   losartan (COZAAR) 50 MG tablet Take 1 tablet (50 mg total) by mouth daily.   meloxicam (MOBIC) 15 MG tablet Take 15 mg by mouth daily as needed for pain.   Multiple Vitamins-Minerals (CENTRUM SILVER ULTRA MENS PO) Take 1 tablet by mouth  daily.   Omega-3 Fatty Acids (FISH OIL PO) Take 1 tablet by mouth 2 (two) times daily.   rosuvastatin (CRESTOR) 10 MG tablet Take 1 tablet (10 mg total) by mouth at bedtime.   No facility-administered encounter medications on file as of 06/13/2021.    Allergies (verified) Bystolic [nebivolol hcl], Carvedilol, Pravastatin, Hctz [hydrochlorothiazide], Hydralazine, and Penicillins   History: Past Medical History:  Diagnosis Date   Dyspnea    History of hiatal hernia    History of kidney stones    Hyperlipidemia 05/14/2018   Hypertension 05/14/2018   Kidney stones    Sinus bradycardia 05/14/2018   Sleep apnea    moderate OSA 02/20/20 HST   Thoracic ascending aortic aneurysm    ascending TAA 4.6 x 4.4 cm 10/03/20 CTA chest   Past Surgical History:  Procedure Laterality Date   COLONOSCOPY     ESOPHAGOGASTRODUODENOSCOPY N/A 12/05/2020   Procedure: ESOPHAGOGASTRODUODENOSCOPY (EGD);  Surgeon: Lajuana Matte, MD;  Location: [facility] OR;  Service: Thoracic;  Laterality: N/A;   LAMINECTOMY     MOLE REMOVAL     PROSTATE BIOPSY     TONSILLECTOMY AND ADENOIDECTOMY     UPPER GASTROINTESTINAL ENDOSCOPY     XI ROBOTIC ASSISTED PARAESOPHAGEAL HERNIA REPAIR N/A 12/05/2020   Procedure: XI ROBOTIC ASSISTED PARAESOPHAGEAL HERNIA REPAIR AND FUNDOPLICATION;  Surgeon: Lajuana Matte, MD;  Location: [facility] OR;  Service: Thoracic;  Laterality: N/A;   Family History  Problem Relation Age of Onset   Hyperlipidemia Mother  Aortic stenosis Mother    Valvular heart disease Mother    Colon cancer Mother    Diabetes Mellitus II Mother    Cancer - Colon Mother    Cancer Mother    Stroke Mother    Cerebrovascular Accident Mother    Diabetes Mother    Hypertension Mother    Hyperlipidemia Father    Diabetes Mellitus II Father    Diabetes Father    Alzheimer's disease Father    Esophageal cancer Paternal Uncle    CAD Maternal Grandmother    Congestive Heart Failure Maternal Grandmother    Heart  attack Maternal Grandmother    Heart failure Maternal Grandmother    Coronary artery disease Maternal Grandmother    Hyperlipidemia Maternal Grandmother    Social History   Socioeconomic History   Marital status: Single   Number of children: 0  Tobacco Use   Smoking status: Never   Smokeless tobacco: Never  Vaping Use   Vaping Use: Never used  Substance and Sexual Activity   Alcohol use: Yes    Comment: occasional   Drug use: Never   Sexual activity: Not on file   Social Determinants of Health   Financial Resource Strain: Not on file  Food Insecurity: Not on file  Transportation Needs: Not on file  Physical Activity: Not on file  Stress: Not on file  Social Connections: Not on file    Tobacco Counseling Counseling given: Patient does not use tobacco products   Clinical Intake:  Pre-visit preparation completed: Yes  Pain : No/denies pain   BMI - recorded: 26.99 Nutritional Status: BMI 25 -29 Overweight Nutritional Risks: None Diabetes: No How often do you need to have someone help you when you read instructions, pamphlets, or other written materials from your doctor or pharmacy?: 1 - Never Interpreter Needed?: No Information entered by :: Maudie Mercury  Activities of Daily Living In your present state of health, do you have any difficulty performing the following activities: 06/13/2021 12/03/2020  Hearing? N N  Vision? N N  Difficulty concentrating or making decisions? N N  Walking or climbing stairs? N N  Dressing or bathing? N N  Doing errands, shopping? N N  Preparing Food and eating ? N -  Using the Toilet? N -  In the past six months, have you accidently leaked urine? N -  Do you have problems with loss of bowel control? N -  Managing your Medications? N -  Managing your Finances? N -  Housekeeping or managing your Housekeeping? N -  Some recent data might be hidden    Patient Care Team: [name], MD as PCP - General (Internal Medicine) [name], MD  as Referring Physician (Internal Medicine) Richardo Priest, MD as Consulting Physician (Cardiology) [name], MD as Consulting Physician (Pulmonary Disease) Joie Bimler, MD as Consulting Physician (Urology)  Indicate any recent Medical Services you may have received from other than [facility] providers in the past year (date may be approximate). [facility] ENT    Assessment:   This is a routine wellness examination for Treshawn.  Hearing/Vision screen No results found.  Dietary issues and exercise activities discussed: Current Exercise Habits: The patient does not participate in regular exercise at present, Exercise limited by: respiratory conditions(s)   Goals Addressed             This Visit's Progress    Patient Stated       Get answers and improvement for [facility] that he  has had over the past 14 months       Depression Screen PHQ 2/9 Scores 06/13/2021 01/03/2021 06/19/2020 05/10/2020 03/06/2020 09/05/2019 08/16/2019  PHQ - 2 Score 0 0 0 0 0 0 0  PHQ- 9 Score - - 1 - - - -    Fall Risk Fall Risk  06/13/2021 01/03/2021 06/19/2020 05/10/2020 03/06/2020  Falls in the past year? 0 0 0 0 -  Number falls in past yr: 0 0 0 0 0  Injury with Fall? 0 0 0 0 -  Risk for fall due to : No Fall Risks No Fall Risks No Fall Risks - -  Follow up Falls evaluation completed;Education provided;Falls prevention discussed Falls evaluation completed Falls evaluation completed - -    FALL RISK PREVENTION PERTAINING TO THE HOME:  Any stairs in or around the home? Yes  single level, couple of steps at entry If so, are there any without handrails? No  Home free of loose throw rugs in walkways, pet beds, electrical cords, etc? Yes  Adequate lighting in your home to reduce risk of falls? Yes   ASSISTIVE DEVICES UTILIZED TO PREVENT FALLS:  Use of a cane, walker or w/c? No  Grab bars in the bathroom? No  Shower chair or bench in shower? No  Elevated toilet seat or a handicapped toilet? No    Cognitive Function:     6CIT Screen 06/13/2021  What Year? 0 points  What month? 0 points  What time? 0 points  Count back from 20 0 points  Months in reverse 0 points  Repeat phrase 0 points  Total Score 0    Immunizations Immunization History  Administered Date(s) Administered   Fluad Quad(high Dose 65+) 12/27/2019, 01/03/2021   Influenza,inj,Quad PF,6+ Mos 12/22/2017   Moderna Covid-19 Vaccine Bivalent Booster 27yrs & up 04/01/2021   Moderna Sars-Covid-2 Vaccination 05/23/2019, 06/20/2019, 04/27/2020   Pneumococcal Polysaccharide-23 09/03/2010   Tdap 12/22/2017   Zoster Recombinat (Shingrix) 06/29/2018, 09/25/2020    TDAP status: Up to date  Flu Vaccine status: Up to date  Pneumococcal vaccine status: Up to date  Covid-19 vaccine status: Completed vaccines  Qualifies for Shingles Vaccine? Yes   Zostavax completed Yes   Shingrix Completed?: Yes  Screening Tests Health Maintenance  Topic Date Due   Hepatitis C Screening  Never done   Pneumonia Vaccine 81+ Years old (2 - PCV) 09/03/2011   TETANUS/TDAP  12/23/2027   COLONOSCOPY (Pts 45-63yrs Insurance coverage will need to be confirmed)  02/10/2028   INFLUENZA VACCINE  Completed   COVID-19 Vaccine  Completed   Zoster Vaccines- Shingrix  Completed   HPV VACCINES  Aged Out    Health Maintenance  Health Maintenance Due  Topic Date Due   Hepatitis C Screening  Never done   Pneumonia Vaccine 31+ Years old (2 - PCV) 09/03/2011    Colorectal cancer screening: Type of screening: Colonoscopy. Completed 2019. Repeat every 10 years  Lung Cancer Screening: (Low Dose CT Chest recommended if Age 15-80 years, 30 pack-year currently smoking OR have quit w/in 15years.) does not qualify.   Additional Screening:  Vision Screening: Recommended annual ophthalmology exams for early detection of glaucoma and other disorders of the eye. Is the patient up to date with their annual eye exam?  Yes   Dental Screening:  Recommended annual dental exams for proper oral hygiene     Plan:    1- Vaccine record request sent to [facility] requesting historical PNA Vaccines  2- EXERCISE  AND DIET: We recommended that you start or continue a regular exercise program for good health. Regular exercise means any activity that makes your heart beat faster and makes you sweat. We recommend exercising at least 30 minutes per day at least 3 days a week, preferably 4 or 5. We also recommend a diet low in fat and sugar. Inactivity, poor dietary choices and obesity can cause diabetes, heart attack, stroke, and kidney damage, among others.  I have personally reviewed and noted the following in the patients chart:   Medical and social history Use of alcohol, tobacco or illicit drugs  Current medications and supplements including opioid prescriptions. Patient is not currently taking opioid prescriptions. Functional ability and status Nutritional status Physical activity Advanced directives List of other physicians Hospitalizations, surgeries, and ER visits in previous 12 months Screenings to include cognitive, depression, and falls Referrals and appointments  In addition, I have reviewed and discussed with patient certain preventive protocols, quality metrics, and best practice recommendations. A written personalized care plan for preventive services as well as general preventive health recommendations were provided to patient.     Erie Noe, LPN   0/06/4033

## 2021-06-13 NOTE — Patient Instructions (Signed)

## 2021-06-18 ENCOUNTER — Ambulatory Visit (INDEPENDENT_AMBULATORY_CARE_PROVIDER_SITE_OTHER): Payer: Medicare HMO

## 2021-06-18 ENCOUNTER — Other Ambulatory Visit: Payer: Self-pay

## 2021-06-18 VITALS — Ht 72.0 in | Wt 199.0 lb

## 2021-06-18 DIAGNOSIS — R0602 Shortness of breath: Secondary | ICD-10-CM

## 2021-06-18 DIAGNOSIS — R079 Chest pain, unspecified: Secondary | ICD-10-CM

## 2021-06-18 MED ORDER — TECHNETIUM TC 99M TETROFOSMIN IV KIT
30.8000 | PACK | Freq: Once | INTRAVENOUS | Status: AC | PRN
  Administered 2021-06-18: 30.8 via INTRAVENOUS

## 2021-06-18 MED ORDER — REGADENOSON 0.4 MG/5ML IV SOLN
0.4000 mg | Freq: Once | INTRAVENOUS | Status: AC
  Administered 2021-06-18: 0.4 mg via INTRAVENOUS

## 2021-06-18 MED ORDER — TECHNETIUM TC 99M TETROFOSMIN IV KIT
10.1000 | PACK | Freq: Once | INTRAVENOUS | Status: AC | PRN
  Administered 2021-06-18: 10.1 via INTRAVENOUS

## 2021-06-24 ENCOUNTER — Other Ambulatory Visit: Payer: Self-pay

## 2021-06-24 DIAGNOSIS — E782 Mixed hyperlipidemia: Secondary | ICD-10-CM

## 2021-06-24 MED ORDER — ROSUVASTATIN CALCIUM 10 MG PO TABS: 10.0000 mg | ORAL_TABLET | Freq: Every day | ORAL | 0 refills | Status: DC

## 2021-06-25 ENCOUNTER — Telehealth: Payer: Self-pay | Admitting: *Deleted

## 2021-06-25 LAB — MYOCARDIAL PERFUSION IMAGING
LV dias vol: 101 mL (ref 62–150)
LV sys vol: 43 mL
Nuc Stress EF: 57 %
Peak HR: 88 {beats}/min
Rest HR: 63 {beats}/min
Rest Nuclear Isotope Dose: 10.1 mCi
SDS: 2
SRS: 0
SSS: 2
Stress Nuclear Isotope Dose: 30.8 mCi
TID: 0.93

## 2021-06-25 NOTE — Telephone Encounter (Signed)
-----   Message from Richardo Priest, MD sent at 06/25/2021 12:28 PM EDT ----- ?Good result, his nuclear imaging or stress test is normal. ?

## 2021-06-25 NOTE — Telephone Encounter (Signed)
Left message for pt to call back about stress test results. ?

## 2021-07-08 ENCOUNTER — Other Ambulatory Visit: Payer: Self-pay | Admitting: Family Medicine

## 2021-07-08 NOTE — Telephone Encounter (Signed)
Refill sent to pharmacy.   

## 2021-07-24 DIAGNOSIS — C61 Malignant neoplasm of prostate: Secondary | ICD-10-CM | POA: Diagnosis not present

## 2021-07-24 DIAGNOSIS — Z87442 Personal history of urinary calculi: Secondary | ICD-10-CM | POA: Diagnosis not present

## 2021-07-29 ENCOUNTER — Other Ambulatory Visit: Payer: Self-pay

## 2021-07-29 DIAGNOSIS — I10 Essential (primary) hypertension: Secondary | ICD-10-CM

## 2021-07-29 MED ORDER — AMLODIPINE BESYLATE 5 MG PO TABS: 5.0000 mg | ORAL_TABLET | Freq: Every day | ORAL | 0 refills | Status: DC

## 2021-08-01 ENCOUNTER — Ambulatory Visit (INDEPENDENT_AMBULATORY_CARE_PROVIDER_SITE_OTHER): Payer: Medicare HMO | Admitting: Family Medicine

## 2021-08-01 VITALS — BP 128/88 | HR 108 | Temp 97.6°F | Resp 20 | Ht 72.0 in | Wt 200.2 lb

## 2021-08-01 DIAGNOSIS — R0789 Other chest pain: Secondary | ICD-10-CM | POA: Diagnosis not present

## 2021-08-01 DIAGNOSIS — R0602 Shortness of breath: Secondary | ICD-10-CM

## 2021-08-01 DIAGNOSIS — I10 Essential (primary) hypertension: Secondary | ICD-10-CM | POA: Diagnosis not present

## 2021-08-01 DIAGNOSIS — G4733 Obstructive sleep apnea (adult) (pediatric): Secondary | ICD-10-CM

## 2021-08-01 NOTE — Progress Notes (Signed)
? ?Subjective:  ?Patient ID: Joe Arroyo, male    DOB: 15-Aug-1955  Age: 66 y.o. MRN: [id number] ? ?Chief Complaint  ?Patient presents with  ? Shortness of Breath  ? ? ?HPI ?Patient is a 66 yo male with progressive dypsnea initially only with exertion, but now is present at rest.  ? ?Chest pain occurs once a week. Exerts himself when it occurs. Heating pad helps. Takes one hour.  ?Worst episode was this past Monday. Midsternal and goes to right chest wall. Very dyspneic. Stabbing pain. No nausea. No vomiting. Patient has had a thorough pulmonary work up that was negative. He actually had hiatal hernia surgery and this did not help his dyspnea.  ? ?Current Outpatient Medications on File Prior to Visit  ?Medication Sig Dispense Refill  ? amLODipine (NORVASC) 5 MG tablet Take 1 tablet (5 mg total) by mouth daily. 90 tablet 0  ? cloNIDine (CATAPRES) 0.3 MG tablet Take 1 tablet (0.3 mg total) by mouth daily. 90 tablet 1  ? eplerenone (INSPRA) 25 MG tablet Take 1 tablet (25 mg total) by mouth daily. 90 tablet 3  ? esomeprazole (NEXIUM) 20 MG capsule Take by mouth daily at 12 noon. OTC    ? ezetimibe (ZETIA) 10 MG tablet Take 1 tablet (10 mg total) by mouth daily. 90 tablet 1  ? finasteride (PROSCAR) 5 MG tablet Take 5 mg by mouth daily.    ? losartan (COZAAR) 50 MG tablet TAKE ONE TABLET EVERY DAY 90 tablet 1  ? meloxicam (MOBIC) 15 MG tablet Take 15 mg by mouth daily as needed for pain.    ? Multiple Vitamins-Minerals (CENTRUM SILVER ULTRA MENS PO) Take 1 tablet by mouth daily.    ? Omega-3 Fatty Acids (FISH OIL PO) Take 1 tablet by mouth 2 (two) times daily.    ? rosuvastatin (CRESTOR) 10 MG tablet Take 1 tablet (10 mg total) by mouth at bedtime. 100 tablet 0  ? ?No current facility-administered medications on file prior to visit.  ? ?Past Medical History:  ?Diagnosis Date  ? Dyspnea   ? History of hiatal hernia   ? History of kidney stones   ? Hyperlipidemia 05/14/2018  ? Hypertension 05/14/2018  ? Kidney stones   ?  Sinus bradycardia 05/14/2018  ? Sleep apnea   ? moderate OSA 02/20/20 HST  ? Thoracic ascending aortic aneurysm ([place])   ? ascending TAA 4.6 x 4.4 cm 10/03/20 CTA chest  ? ?Past Surgical History:  ?Procedure Laterality Date  ? COLONOSCOPY    ? ESOPHAGOGASTRODUODENOSCOPY N/A 12/05/2020  ? Procedure: ESOPHAGOGASTRODUODENOSCOPY (EGD);  Surgeon: Lajuana Matte, MD;  Location: [facility];  Service: Thoracic;  Laterality: N/A;  ? LAMINECTOMY    ? MOLE REMOVAL    ? PROSTATE BIOPSY    ? TONSILLECTOMY AND ADENOIDECTOMY    ? UPPER GASTROINTESTINAL ENDOSCOPY    ? XI ROBOTIC ASSISTED PARAESOPHAGEAL HERNIA REPAIR N/A 12/05/2020  ? Procedure: XI ROBOTIC ASSISTED PARAESOPHAGEAL HERNIA REPAIR AND FUNDOPLICATION;  Surgeon: Lajuana Matte, MD;  Location: [facility];  Service: Thoracic;  Laterality: N/A;  ?  ?Family History  ?Problem Relation Age of Onset  ? Hyperlipidemia Mother   ? Aortic stenosis Mother   ? Valvular heart disease Mother   ? Colon cancer Mother   ? Diabetes Mellitus II Mother   ? Cancer - Colon Mother   ? Cancer Mother   ? Stroke Mother   ? Cerebrovascular Accident Mother   ? Diabetes Mother   ? Hypertension Mother   ?  Hyperlipidemia Father   ? Diabetes Mellitus II Father   ? Diabetes Father   ? Alzheimer's disease Father   ? Esophageal cancer Paternal Uncle   ? CAD Maternal Grandmother   ? Congestive Heart Failure Maternal Grandmother   ? Heart attack Maternal Grandmother   ? Heart failure Maternal Grandmother   ? Coronary artery disease Maternal Grandmother   ? Hyperlipidemia Maternal Grandmother   ? ?Social History  ? ?Socioeconomic History  ? Marital status: Single  ?  Spouse name: Not on file  ? Number of children: Not on file  ? Years of education: Not on file  ? Highest education level: Not on file  ?Occupational History  ? Not on file  ?Tobacco Use  ? Smoking status: Never  ? Smokeless tobacco: Never  ?Vaping Use  ? Vaping Use: Never used  ?Substance and Sexual Activity  ? Alcohol use: Yes  ?  Comment:  occasional  ? Drug use: Never  ? Sexual activity: Not on file  ?Other Topics Concern  ? Not on file  ?Social History Narrative  ? Not on file  ? ?Social Determinants of Health  ? ?Financial Resource Strain: Not on file  ?Food Insecurity: Not on file  ?Transportation Needs: Not on file  ?Physical Activity: Not on file  ?Stress: Not on file  ?Social Connections: Not on file  ? ? ?Review of Systems  ?Constitutional:  Positive for fatigue. Negative for chills and fever.  ?HENT:  Positive for congestion.   ?Respiratory:  Positive for cough and shortness of breath.   ?Cardiovascular:  Positive for chest pain and palpitations.  ?Genitourinary:  Negative for dysuria.  ?Musculoskeletal:  Positive for myalgias (aching pain in legs). Negative for back pain.  ?Neurological:  Positive for dizziness, weakness and headaches.  ?Psychiatric/Behavioral:  Negative for dysphoric mood. The patient is not nervous/anxious.   ? ? ?Objective:  ?BP 128/88   Pulse (!) 108   Temp 97.6 ?F (36.4 ?C)   Resp 20   Ht 6' (1.829 m)   Wt 200 lb 3.2 oz (90.8 kg)   SpO2 96%   BMI 27.15 kg/m?  ? ? ?  08/01/2021  ?  2:05 PM 06/18/2021  ? 10:52 AM 06/10/2021  ? 10:30 AM  ?BP/Weight  ?Systolic BP 782  423  ?Diastolic BP 88  90  ?Wt. (Lbs) 200.2 199 199  ?BMI 27.15 kg/m2 26.99 kg/m2 26.99 kg/m2  ? ? ?Physical Exam ?Vitals reviewed.  ?Constitutional:   ?   General: He is not in acute distress. ?Cardiovascular:  ?   Rate and Rhythm: Regular rhythm. Tachycardia present.  ?   Heart sounds: No murmur heard. ?Pulmonary:  ?   Effort: Tachypnea present.  ?   Breath sounds: Normal breath sounds.  ?Chest:  ?   Chest wall: No tenderness.  ?Abdominal:  ?   General: Bowel sounds are normal.  ?   Palpations: Abdomen is soft.  ?   Tenderness: There is no abdominal tenderness.  ?Musculoskeletal:  ?   Right lower leg: No edema.  ?   Left lower leg: No edema.  ?Neurological:  ?   Mental Status: He is alert.  ? ? ?Diabetic Foot Exam - Simple   ?No data filed ?  ?  ? ?Lab  Results  ?Component Value Date  ? WBC 6.1 04/17/2021  ? HGB 14.6 04/17/2021  ? HCT 44.5 04/17/2021  ? PLT 239 04/17/2021  ? GLUCOSE 75 04/26/2021  ? CHOL 141 04/17/2021  ?  TRIG 99 04/17/2021  ? HDL 57 04/17/2021  ? [place] 66 04/17/2021  ? ALT 32 04/17/2021  ? AST 38 04/17/2021  ? NA 141 04/26/2021  ? K 4.1 04/26/2021  ? CL 102 04/26/2021  ? CREATININE 1.10 04/26/2021  ? BUN 15 04/26/2021  ? CO2 22 04/26/2021  ? TSH 3.150 09/25/2020  ? INR 1.0 12/03/2020  ? ? ? ? ?Assessment & Plan:  ? ?Problem List Items Addressed This Visit   ? ?  ? Cardiovascular and Mediastinum  ? Essential hypertension, benign  ?  Stop eplerenone. Patient does not feel it is helping. Bp is still high in the evenings. Consider alternative treatments. Ordering cpap.  ? ?  ?  ?  ? Respiratory  ? OSA (obstructive sleep apnea)  ?  Recommended cpap. Patient has wanted to clear up other problems first before starting, but I strongly recommend cpap. He has refused. He would prefer alternative treatments such as mouth piece/implant.  ? ?  ?  ?  ? Other  ? Shortness of breath  ?  Recommend refer to another pulmonologist for a second opinion and possibly to take over care as he is continuing to worsen.  ?Cardiology to order cta of coronary arteries.  ? ?  ?  ? Relevant Orders  ? EKG 12-Lead  ? Ambulatory referral to Pulmonology  ? Atypical chest pain - Primary  ?  EKG: NSR, no st changes.  ?I discussed with Dr. Bettina Gavia. Will order cta of coronary arteries.  ? ?  ?  ? Relevant Orders  ? EKG 12-Lead  ? ?Total time spent on today's visit was greater than 30 minutes, including both face-to-face time and nonface-to-face time personally spent on review of chart (labs and imaging), discussing labs and goals, discussing further work-up, treatment options, referrals to specialist if needed, reviewing outside records of pertinent, answering patient's questions, and coordinating care. ? ?Follow-up: Return in about 6 weeks (around 09/12/2021) for awv with Dr. Tobie Poet.  . ? ?An After Visit Summary was printed and given to the patient. ? ?[name], MD ?[facility] ?[phone] ?

## 2021-08-05 ENCOUNTER — Encounter: Payer: Self-pay | Admitting: Family Medicine

## 2021-08-05 ENCOUNTER — Telehealth: Payer: Self-pay

## 2021-08-05 NOTE — Telephone Encounter (Signed)
Dr. Tobie Poet discussed symptoms with Dr. Bettina Gavia.  The possibility of a heart cath. VS cardiac CTA were discussed and Dr. Bettina Gavia is going to order the CTA.  Patient was notified and he is in agreement.   ?

## 2021-08-05 NOTE — Assessment & Plan Note (Addendum)
Recommended cpap. Patient has wanted to clear up other problems first before starting, but I strongly recommend cpap. He has refused. He would prefer alternative treatments such as mouth piece/implant.  ?

## 2021-08-05 NOTE — Assessment & Plan Note (Signed)
Recommend refer to another pulmonologist for a second opinion and possibly to take over care as he is continuing to worsen.  ?Cardiology to order cta of coronary arteries.  ?

## 2021-08-05 NOTE — Assessment & Plan Note (Signed)
EKG: NSR, no st changes.  ?I discussed with Dr. Bettina Gavia. Will order cta of coronary arteries.  ?

## 2021-08-05 NOTE — Assessment & Plan Note (Signed)
Stop eplerenone. Patient does not feel it is helping. Bp is still high in the evenings. Consider alternative treatments. Ordering cpap.  ?

## 2021-08-06 ENCOUNTER — Other Ambulatory Visit: Payer: Self-pay

## 2021-08-09 ENCOUNTER — Other Ambulatory Visit: Payer: Self-pay

## 2021-08-09 ENCOUNTER — Telehealth: Payer: Self-pay

## 2021-08-09 DIAGNOSIS — R079 Chest pain, unspecified: Secondary | ICD-10-CM

## 2021-08-09 MED ORDER — METOPROLOL TARTRATE 100 MG PO TABS: 100.0000 mg | ORAL_TABLET | Freq: Once | ORAL | 0 refills | Status: DC

## 2021-08-09 NOTE — Telephone Encounter (Signed)
Received a message from Dr. Bettina Gavia to order a coronary CT for this patient. Coronary CT was ordered and the instructions were reviewed with the patient and a copy of the instructions were mailed to the patient. After reviewing the instructions with the patient he had no further questions. ?

## 2021-08-28 ENCOUNTER — Telehealth (HOSPITAL_COMMUNITY): Payer: Self-pay | Admitting: *Deleted

## 2021-08-28 NOTE — Telephone Encounter (Signed)
Reaching out to patient to offer assistance regarding upcoming cardiac imaging study; pt verbalizes understanding of appt date/time, parking situation and where to check in, pre-test NPO status and medications ordered, and verified current allergies; name and call back number provided for further questions should they arise ? ?Gordy Clement RN Navigator Cardiac Imaging ?[facility] ?[phone] office ?[phone] cell ? ?Patient to take '100mg'$  metoprolol tartrate two hours prior to his cardiac CT scan. He is aware to arrive at 11:30am. ?

## 2021-08-29 ENCOUNTER — Ambulatory Visit (HOSPITAL_COMMUNITY)
Admission: RE | Admit: 2021-08-29 | Discharge: 2021-08-29 | Disposition: A | Discharge status: Home / Self Care | Payer: Medicare HMO | Source: Ambulatory Visit | Attending: Internal Medicine | Admitting: Internal Medicine

## 2021-08-29 ENCOUNTER — Ambulatory Visit (HOSPITAL_COMMUNITY)
Admission: RE | Admit: 2021-08-29 | Discharge: 2021-08-29 | Disposition: A | Discharge status: Home / Self Care | Payer: Medicare HMO | Source: Ambulatory Visit | Attending: Cardiology | Admitting: Cardiology

## 2021-08-29 DIAGNOSIS — K449 Diaphragmatic hernia without obstruction or gangrene: Secondary | ICD-10-CM | POA: Diagnosis not present

## 2021-08-29 DIAGNOSIS — I7121 Aneurysm of the ascending aorta, without rupture: Secondary | ICD-10-CM | POA: Insufficient documentation

## 2021-08-29 DIAGNOSIS — R931 Abnormal findings on diagnostic imaging of heart and coronary circulation: Secondary | ICD-10-CM

## 2021-08-29 DIAGNOSIS — I2510 Atherosclerotic heart disease of native coronary artery without angina pectoris: Secondary | ICD-10-CM | POA: Diagnosis not present

## 2021-08-29 DIAGNOSIS — R918 Other nonspecific abnormal finding of lung field: Secondary | ICD-10-CM | POA: Insufficient documentation

## 2021-08-29 DIAGNOSIS — R079 Chest pain, unspecified: Secondary | ICD-10-CM | POA: Diagnosis present

## 2021-08-29 MED ORDER — NITROGLYCERIN 0.4 MG SL SUBL
0.8000 mg | SUBLINGUAL_TABLET | Freq: Once | SUBLINGUAL | Status: AC
  Administered 2021-08-29: 0.8 mg via SUBLINGUAL

## 2021-08-29 MED ORDER — NITROGLYCERIN 0.4 MG SL SUBL
SUBLINGUAL_TABLET | SUBLINGUAL | Status: AC
  Filled 2021-08-29: qty 2

## 2021-08-29 MED ORDER — IOHEXOL 350 MG/ML SOLN
100.0000 mL | Freq: Once | INTRAVENOUS | Status: AC | PRN
  Administered 2021-08-29: 100 mL via INTRAVENOUS

## 2021-08-29 MED ORDER — SODIUM CHLORIDE 0.9 % IV BOLUS
250.0000 mL | Freq: Once | INTRAVENOUS | Status: AC
  Administered 2021-08-29: 250 mL via INTRAVENOUS

## 2021-08-29 NOTE — Progress Notes (Signed)
Pt complaining of nausea post nitro dose. BP 50'P systolic. Pt diaphoretic and pale. 254m saline bolus started.

## 2021-08-29 NOTE — Progress Notes (Signed)
Pt feeling better.can completed. Pt D/C home, ambulatory, awake and alert. In no distress

## 2021-08-30 ENCOUNTER — Telehealth: Payer: Self-pay | Admitting: Cardiology

## 2021-08-30 ENCOUNTER — Other Ambulatory Visit: Payer: Self-pay

## 2021-08-30 DIAGNOSIS — R0602 Shortness of breath: Secondary | ICD-10-CM | POA: Diagnosis not present

## 2021-08-30 MED ORDER — ASPIRIN 81 MG PO TBEC: 81.0000 mg | DELAYED_RELEASE_TABLET | Freq: Every day | ORAL | 3 refills | Status: DC

## 2021-08-30 NOTE — Telephone Encounter (Signed)
Patient states he has a few more questions regarding his CT results and he was unable to discuss previously. He is requesting a call back from the nurse to discuss.

## 2021-09-02 ENCOUNTER — Other Ambulatory Visit: Payer: Self-pay

## 2021-09-02 MED ORDER — EZETIMIBE 10 MG PO TABS: 10.0000 mg | ORAL_TABLET | Freq: Every day | ORAL | 1 refills | Status: DC

## 2021-09-02 NOTE — Telephone Encounter (Signed)
Called patient because he had questions regarding his CT results. Answered all of the patient's questions. Patient had no further questions at this time.

## 2021-09-11 ENCOUNTER — Other Ambulatory Visit: Payer: Self-pay

## 2021-09-11 DIAGNOSIS — R0600 Dyspnea, unspecified: Secondary | ICD-10-CM | POA: Insufficient documentation

## 2021-09-11 MED ORDER — CLONIDINE HCL 0.3 MG PO TABS: 0.3000 mg | ORAL_TABLET | Freq: Every day | ORAL | 1 refills | Status: DC

## 2021-09-11 NOTE — Progress Notes (Signed)
Subjective:   Joe Arroyo is a 66 y.o. male who presents for Medicare Annual/Subsequent preventive examination.   Patient reports he has functional carbon monoxide and smoke detectors in his home.  He denies any guns in his home.  Patient sees the dentist and eye doctor on a regular basis.  He uses reading glasses only.  Patient is currently having a work-up for his continued shortness of breath and chest pain.  He sees Dr. Bettina Gavia tomorrow to discuss the CTA of his coronaries.  I recommended he avoid exertional activity until he sees Dr. Bettina Gavia.  Review of Systems    Review of Systems  Constitutional:  Negative for chills and malaise/fatigue.  HENT:  Negative for congestion and sore throat.   Respiratory:  Positive for cough and shortness of breath. Negative for wheezing.   Cardiovascular:  Positive for chest pain. Negative for leg swelling.  Gastrointestinal:  Negative for abdominal pain, diarrhea, nausea and vomiting.  Genitourinary:  Negative for dysuria and frequency.  Musculoskeletal:  Positive for back pain and joint pain (diffuse joint pain). Negative for myalgias.  Neurological:  Positive for dizziness and headaches. Negative for weakness.  Psychiatric/Behavioral:  Negative for depression. The patient is not nervous/anxious.     Cardiac Risk Factors include: advanced age (>19mn, >>74women);hypertension;dyslipidemia;male gender     Objective:    Today's Vitals   09/12/21 0818  BP: 130/84  Pulse: 76  Resp: 16  Temp: (!) 97.3 F (36.3 C)  Weight: 202 lb (91.6 kg)  Height: 6' (1.829 m)   Body mass index is 27.4 kg/m.     09/12/2021    8:48 AM 06/13/2021   10:38 AM 12/03/2020    2:23 PM  Advanced Directives  Does Patient Have a Medical Advance Directive? Yes Yes Yes  Type of AParamedicof ASour JohnLiving will HRainelleLiving will  Does patient want to make changes to medical advance directive?  No  - Patient declined No - Patient declined  Copy of [place] Chart?  Yes - validated most recent copy scanned in chart (See row information) No - copy requested    Current Medications (verified) Outpatient Encounter Medications as of 09/12/2021  Medication Sig   amLODipine (NORVASC) 5 MG tablet Take 1 tablet (5 mg total) by mouth daily.   aspirin EC 81 MG tablet Take 1 tablet (81 mg total) by mouth daily. Swallow whole.   cloNIDine (CATAPRES) 0.3 MG tablet Take 1 tablet (0.3 mg total) by mouth daily.   esomeprazole (NEXIUM) 20 MG capsule Take by mouth daily at 12 noon. OTC   finasteride (PROSCAR) 5 MG tablet Take 5 mg by mouth daily.   Multiple Vitamins-Minerals (CENTRUM SILVER ULTRA MENS PO) Take 1 tablet by mouth daily.   Omega-3 Fatty Acids (FISH OIL PO) Take 1 tablet by mouth 2 (two) times daily.   rosuvastatin (CRESTOR) 10 MG tablet Take 1 tablet (10 mg total) by mouth at bedtime.   [DISCONTINUED] ezetimibe (ZETIA) 10 MG tablet Take 1 tablet (10 mg total) by mouth daily.   ezetimibe (ZETIA) 10 MG tablet Take 1 tablet (10 mg total) by mouth daily.   [DISCONTINUED] cloNIDine (CATAPRES) 0.3 MG tablet Take 1 tablet (0.3 mg total) by mouth daily.   [DISCONTINUED] eplerenone (INSPRA) 25 MG tablet Take 1 tablet (25 mg total) by mouth daily.   [DISCONTINUED] losartan (COZAAR) 50 MG tablet TAKE ONE TABLET EVERY DAY   [DISCONTINUED] meloxicam (  MOBIC) 15 MG tablet Take 15 mg by mouth daily as needed for pain.   [DISCONTINUED] metoprolol tartrate (LOPRESSOR) 100 MG tablet Take 1 tablet (100 mg total) by mouth once for 1 dose. Please take 2 hours before CT   No facility-administered encounter medications on file as of 09/12/2021.    Allergies (verified) Bystolic [nebivolol hcl], Carvedilol, Pravastatin, Hctz [hydrochlorothiazide], Hydralazine, and Penicillins   History: Past Medical History:  Diagnosis Date   Atypical chest pain 01/07/2021   Chronic idiopathic constipation  01/07/2021   Cough 09/05/2019   Dyspnea    Essential hypertension, benign 05/14/2018   GERD (gastroesophageal reflux disease) 09/28/2020   Hiatal hernia 09/28/2020   History of hiatal hernia    History of kidney stones    Hyperlipidemia 05/14/2018   Malaise 09/05/2019   Non-seasonal allergic rhinitis due to pollen 08/16/2019   OSA (obstructive sleep apnea) 09/28/2020   Other headache syndrome 08/16/2019   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   Shortness of breath 09/05/2019   Sinus bradycardia 05/14/2018   Syncope 09/05/2019   Thoracic ascending aortic aneurysm (HCC)    ascending TAA 4.6 x 4.4 cm 10/03/20 CTA chest   Past Surgical History:  Procedure Laterality Date   COLONOSCOPY     ESOPHAGOGASTRODUODENOSCOPY N/A 12/05/2020   Procedure: ESOPHAGOGASTRODUODENOSCOPY (EGD);  Surgeon: Lajuana Matte, MD;  Location: [facility] OR;  Service: Thoracic;  Laterality: N/A;   LAMINECTOMY     MOLE REMOVAL     PROSTATE BIOPSY     TONSILLECTOMY AND ADENOIDECTOMY     UPPER GASTROINTESTINAL ENDOSCOPY     XI ROBOTIC ASSISTED PARAESOPHAGEAL HERNIA REPAIR N/A 12/05/2020   Procedure: XI ROBOTIC ASSISTED PARAESOPHAGEAL HERNIA REPAIR AND FUNDOPLICATION;  Surgeon: Lajuana Matte, MD;  Location: [place];  Service: Thoracic;  Laterality: N/A;   Family History  Problem Relation Age of Onset   Hyperlipidemia Mother    Aortic stenosis Mother    Valvular heart disease Mother    Colon cancer Mother    Diabetes Mellitus II Mother    Cancer - Colon Mother    Cancer Mother    Stroke Mother    Cerebrovascular Accident Mother    Diabetes Mother    Hypertension Mother    Hyperlipidemia Father    Diabetes Mellitus II Father    Diabetes Father    Alzheimer's disease Father    Esophageal cancer Paternal Uncle    CAD Maternal Grandmother    Congestive Heart Failure Maternal Grandmother    Heart attack Maternal Grandmother    Heart failure Maternal Grandmother    Coronary artery disease Maternal Grandmother     Hyperlipidemia Maternal Grandmother    Social History   Socioeconomic History   Marital status: Single    Spouse name: Not on file   Number of children: Not on file   Years of education: Not on file   Highest education level: Not on file  Occupational History   Not on file  Tobacco Use   Smoking status: Never   Smokeless tobacco: Never  Vaping Use   Vaping Use: Never used  Substance and Sexual Activity   Alcohol use: Yes    Comment: occasional   Drug use: Never   Sexual activity: Not on file  Other Topics Concern   Not on file  Social History Narrative   Not on file   Social Determinants of Health   Financial Resource Strain: Low Risk  (09/12/2021)   Overall Financial Resource Strain (CARDIA)    Difficulty of  Paying Living Expenses: Not hard at all  Food Insecurity: No Food Insecurity (09/12/2021)   Hunger Vital Sign    Worried About Running Out of Food in the Last Year: Never true    Ran Out of Food in the Last Year: Never true  Transportation Needs: No Transportation Needs (09/12/2021)   PRAPARE - Hydrologist (Medical): No    Lack of Transportation (Non-Medical): No  Physical Activity: Unknown (09/12/2021)   Exercise Vital Sign    Days of Exercise per Week: 0 days    Minutes of Exercise per Session: Not on file  Stress: No Stress Concern Present (09/12/2021)   [place]    Feeling of Stress : Only a little  Social Connections: Socially Isolated (09/12/2021)   Social Connection and Isolation Panel [NHANES]    Frequency of Communication with Friends and Family: More than three times a week    Frequency of Social Gatherings with Friends and Family: Once a week    Attends Religious Services: Never    Marine scientist or Organizations: No    Attends Archivist Meetings: Never    Marital Status: Never married    Tobacco Counseling Counseling given: Not  Answered  Activities of Daily Living    09/12/2021    8:45 AM 06/13/2021   10:39 AM  In your present state of health, do you have any difficulty performing the following activities:  Hearing? 0 0  Vision? 0 0  Difficulty concentrating or making decisions? 1 0  Walking or climbing stairs? 0 0  Dressing or bathing? 0 0  Doing errands, shopping? 0 0  Preparing Food and eating ? N N  Using the Toilet? N N  In the past six months, have you accidently leaked urine? N N  Do you have problems with loss of bowel control? N N  Managing your Medications? N N  Managing your Finances? N N  Housekeeping or managing your Housekeeping? N N    Patient Care Team: [name], MD as PCP - General (Internal Medicine) [name], MD as Referring Physician (Internal Medicine) Richardo Priest, MD as Consulting Physician (Cardiology) [name], MD as Consulting Physician (Pulmonary Disease) Joie Bimler, MD as Consulting Physician (Urology)  Indicate any recent Medical Services you may have received from other than [facility] providers in the past year (date may be approximate).     Assessment:   This is a routine wellness examination for Joe Arroyo.  Dietary issues and exercise activities discussed: Current Exercise Habits: The patient does not participate in regular exercise at present, Exercise limited by: cardiac condition(s)  Depression Screen    09/12/2021    8:45 AM 08/01/2021    2:16 PM 06/13/2021   10:39 AM 01/03/2021    9:39 AM 06/19/2020    9:23 AM 05/10/2020   10:27 AM 03/06/2020    9:48 AM  PHQ 2/9 Scores  PHQ - 2 Score 0 0 0 0 0 0 0  PHQ- 9 Score     1      Fall Risk    09/12/2021    8:21 AM 08/01/2021    2:16 PM 06/13/2021   10:39 AM 01/03/2021    9:39 AM 06/19/2020    9:23 AM  [place] in the past year? 0 0 0 0 0  Number falls in past yr: 0 0 0 0 0  Injury with Fall?  0 0 0 0 0  Risk for fall due to : No Fall Risks  No Fall Risks No Fall Risks No Fall Risks  Follow up  Falls evaluation completed Falls evaluation completed Falls evaluation completed;Education provided;Falls prevention discussed Falls evaluation completed Falls evaluation completed    FALL RISK PREVENTION PERTAINING TO THE HOME:  Any stairs in or around the home? Yes If so, are there any without handrails? No  Home free of loose throw rugs in walkways, pet beds, electrical cords, etc? Yes  Adequate lighting in your home to reduce risk of falls? Yes   ASSISTIVE DEVICES UTILIZED TO PREVENT FALLS:  Life alert? No  Use of a cane, walker or w/c? No  Grab bars in the bathroom? Yes  Shower chair or bench in shower? No  Elevated toilet seat or a handicapped toilet? Yes   TIMED UP AND GO:  Was the test performed? Yes .  Length of time to ambulate 10 feet: 5 sec.   Gait steady and fast without use of assistive device  Cognitive Function:        09/12/2021    8:46 AM 06/13/2021   10:40 AM  6CIT Screen  What Year? 0 points 0 points  What month? 0 points 0 points  What time? 0 points 0 points  Count back from 20 0 points 0 points  Months in reverse 0 points 0 points  Repeat phrase 0 points 0 points  Total Score 0 points 0 points    Immunizations Immunization History  Administered Date(s) Administered   Fluad Quad(high Dose 65+) 12/27/2019, 01/03/2021   Influenza,inj,Quad PF,6+ Mos 12/22/2017   Moderna Covid-19 Vaccine Bivalent Booster 89yr & up 04/01/2021   Moderna Sars-Covid-2 Vaccination 05/23/2019, 06/20/2019, 04/27/2020   PNEUMOCOCCAL CONJUGATE-20 09/12/2021   Pneumococcal Polysaccharide-23 09/03/2010   Tdap 12/22/2017   Zoster Recombinat (Shingrix) 06/29/2018, 09/25/2020    TDAP status: Up to date  Flu Vaccine status: Up to date  Pneumococcal vaccine status: Up to date  Covid-19 vaccine status: Completed vaccines  Qualifies for Shingles Vaccine? Yes   Zostavax completed Yes   Shingrix Completed?: Yes  Screening Tests Health Maintenance  Topic Date Due    Hepatitis C Screening  Never done   COVID-19 Vaccine (5 - Moderna series) 07/31/2021   INFLUENZA VACCINE  11/12/2021   COLONOSCOPY (Pts 45-445yrInsurance coverage will need to be confirmed)  02/10/2023   TETANUS/TDAP  12/23/2027   Pneumonia Vaccine 65107Years old  Completed   Zoster Vaccines- Shingrix  Completed   HPV VACCINES  Aged Out    Health Maintenance  Health Maintenance Due  Topic Date Due   Hepatitis C Screening  Never done   COVID-19 Vaccine (5 - Moderna series) 07/31/2021    Colorectal cancer screening: Type of screening: Colonoscopy. Completed 2019. Repeat every 5 years  Lung Cancer Screening: (Low Dose CT Chest recommended if Age 68-80ears, 30 pack-year currently smoking OR have quit w/in 15years.) does not qualify.   Lung Cancer Screening Referral: not applicable.  Additional Screening:  Hepatitis C Screening:not done.  Dental Screening: Recommended annual dental exams for proper oral hygiene  Community Resource Referral / Chronic Care Management: CRR required this visit?  No   CCM required this visit?  No      Plan:     I have personally reviewed and noted the following in the patient's chart:   Medical and social history Use of alcohol, tobacco or illicit drugs  Current medications and supplements including  opioid prescriptions. Patient is not currently taking opioid prescriptions. Functional ability and status Nutritional status Physical activity Advanced directives List of other physicians Hospitalizations, surgeries, and ER visits in previous 12 months Vitals Screenings to include cognitive, depression, and falls Referrals and appointments  In addition, I have reviewed and discussed with patient certain preventive protocols, quality metrics, and best practice recommendations. A written personalized care plan for preventive services as well as general preventive health recommendations were provided to patient.     I,Lauren M Auman,acting  as a scribe for [name], MD.,have documented all relevant documentation on the behalf of [name], MD,as directed by  [name], MD while in the presence of [name], MD.    [name], MD   09/21/2021

## 2021-09-12 ENCOUNTER — Ambulatory Visit (INDEPENDENT_AMBULATORY_CARE_PROVIDER_SITE_OTHER): Payer: Medicare HMO | Admitting: Family Medicine

## 2021-09-12 ENCOUNTER — Encounter: Payer: Self-pay | Admitting: Family Medicine

## 2021-09-12 VITALS — BP 130/84 | HR 76 | Temp 97.3°F | Resp 16 | Ht 72.0 in | Wt 202.0 lb

## 2021-09-12 DIAGNOSIS — Z23 Encounter for immunization: Secondary | ICD-10-CM

## 2021-09-12 DIAGNOSIS — Z0000 Encounter for general adult medical examination without abnormal findings: Secondary | ICD-10-CM

## 2021-09-12 MED ORDER — EZETIMIBE 10 MG PO TABS: 10.0000 mg | ORAL_TABLET | Freq: Every day | ORAL | 1 refills | Status: DC

## 2021-09-12 NOTE — Progress Notes (Addendum)
Cardiology Office Note:    Date:  09/13/2021   ID:  Joe Arroyo, DOB [birth year], MRN [id number]  PCP:  [name], MD  Cardiologist:  Shirlee More, MD    Referring MD: [name], MD   09/25/2021 His echocardiogram shows no findings of heart failure or pulmonary hypertension. With his abnormal CTA anginal equivalent exertional shortness of breath referred to coronary angiography When seen by me in the office we have reviewed the risk benefits and options and tentatively planned for coronary angiography pending echo results. ASSESSMENT:    1. Shortness of breath   2. Coronary artery disease involving native coronary artery of native heart with other form of angina pectoris ([place])   3. Resistant hypertension   4. Aneurysm of ascending aorta without rupture ([place])   5. Nonrheumatic aortic valve insufficiency    PLAN:    In order of problems listed above:  Although he is here today for follow-up for cardiac CTA which showed distal right coronary artery PDA flow-limiting stenosis his clinical problem is severe progressive shortness of breath and what appears to be heart failure he has edema weight gain third heart sound I will check labs including a proBNP initiate a loop diuretic.  I will also have him do a repeat echocardiogram in our office this is very complex and that he is short of breath out of proportion to his coronary disease valvular heart disease or lung nodules and scarring on CT scan. He has a flow-limiting stenosis distal right coronary artery he is not having typical angina I would continue treatment including aspirin calcium channel blocker nonstatin if proBNP is normal echocardiogram unremarkable coronary angiography right and left heart cath is appropriate Continue current treatment for hypertension Stable plan CTA most recent 46 mm   Next appointment: 2 weeks   Medication Adjustments/Labs and Tests Ordered: Current medicines are reviewed at length with the  patient today.  Concerns regarding medicines are outlined above.  Orders Placed This Encounter  Procedures   Pro b natriuretic peptide   Basic Metabolic Panel (BMET)   Meds ordered this encounter  Medications   furosemide (LASIX) 20 MG tablet    Sig: Take 1 tablet (20 mg total) by mouth daily.    Dispense:  90 tablet    Refill:  3    Chief complaint I am here to follow-up after an outpatient cardiac CTA but my biggest problem is severe shortness of breath now at rest   History of Present Illness:    Joe Arroyo is a 66 y.o. male with a hx of resistant hypertension obstructive sleep apnea and stable ascending aortic aneurysm 47 mmHg last seen 06/10/2021.  He had a cardiac CTA reported in [facility] with abnormal FFR.  Compliance with diet, lifestyle and medications: Yes  Is a very complex visit he is here to see me after cardiac CTA as an outpatient.  The indication was chest pain.  He tells me he has chest pain perhaps every 3 to 4 weeks it is nonexertional is substernal radiates to the right shoulder can last up to 30 minutes and seems to be improved with a heating pad is not anginal in nature.  I asked him what his biggest problem is and he tells me his progressive severe shortness of breath over period of months He has gained 10 pounds and was unaware he had peripheral edema He has a cough with sputum he uses a bronchodilator notes of no help Cardiac CTA showed pulmonary nodules and  some scarring but certainly not a magnitude to cause him to be short of breath at rest. He is breathless with any activity he has to stop and rest with ADLs but he has no orthopnea PND. He has not had palpitation or syncope.  I reviewed his records and he has mild aortic regurgitation. 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   Shortness of breath 09/05/2019   Sinus bradycardia 05/14/2018   Syncope 09/05/2019   Thoracic ascending aortic aneurysm (HCC)    ascending TAA 4.6 x 4.4 cm 10/03/20 CTA chest    Past Surgical History:  Procedure Laterality Date   COLONOSCOPY     ESOPHAGOGASTRODUODENOSCOPY N/A 12/05/2020   Procedure: ESOPHAGOGASTRODUODENOSCOPY (EGD);  Surgeon: Lajuana Matte, MD;  [facility];  Service: Thoracic;  Laterality: N/A;   LAMINECTOMY     MOLE REMOVAL     PROSTATE BIOPSY     TONSILLECTOMY AND ADENOIDECTOMY     UPPER GASTROINTESTINAL ENDOSCOPY     XI ROBOTIC ASSISTED PARAESOPHAGEAL HERNIA REPAIR N/A 12/05/2020   Procedure: XI ROBOTIC ASSISTED PARAESOPHAGEAL HERNIA REPAIR AND FUNDOPLICATION;  Surgeon: Lajuana Matte, MD;  Location: [facility] OR;  Service: Thoracic;  Laterality: N/A;    Current Medications: Current Meds  Medication Sig   amLODipine (NORVASC) 5 MG tablet Take 1 tablet (5 mg total) by mouth daily.   aspirin EC 81 MG tablet Take 1 tablet (81 mg total) by mouth daily. Swallow whole.   cloNIDine (CATAPRES) 0.3 MG tablet Take 1 tablet (0.3 mg total) by mouth daily.   esomeprazole (NEXIUM) 20 MG capsule Take by mouth daily at 12 noon. OTC   ezetimibe (ZETIA) 10 MG tablet Take 1 tablet (10 mg total) by mouth daily.   finasteride (PROSCAR) 5 MG tablet Take 5 mg by mouth daily.   furosemide (LASIX) 20 MG tablet Take 1 tablet (20 mg total) by mouth daily.   Multiple Vitamins-Minerals (CENTRUM SILVER ULTRA MENS PO) Take 1 tablet by mouth daily.   Omega-3 Fatty Acids (FISH OIL PO) Take 1 tablet by mouth 2 (two) times daily.   rosuvastatin (CRESTOR) 10 MG  tablet Take 1 tablet (10 mg total) by mouth at bedtime.     Allergies:   Bystolic [nebivolol hcl], Carvedilol, Pravastatin, Hctz [hydrochlorothiazide], Hydralazine, and Penicillins   Social History   Socioeconomic History   Marital status: Single    Spouse name: Not on file   Number of children: Not on file   Years of education: Not on file   Highest education level: Not on file  Occupational History   Not on file  Tobacco Use   Smoking status: Never   Smokeless tobacco: Never  Vaping Use   Vaping Use: Never used  Substance and Sexual Activity   Alcohol use: Yes    Comment: occasional   Drug use: Never   Sexual activity: Not on file  Other Topics Concern   Not on file  Social History Narrative   Not on file  Social Determinants of Health   Financial Resource Strain: Low Risk    Difficulty of Paying Living Expenses: Not hard at all  Food Insecurity: No Food Insecurity   Worried About Charity fundraiser in the Last Year: Never true   [place] in the Last Year: Never true  Transportation Needs: No Transportation Needs   Lack of Transportation (Medical): No   Lack of Transportation (Non-Medical): No  Physical Activity: Unknown   Days of Exercise per Week: 0 days   Minutes of Exercise per Session: Not on file  Stress: No Stress Concern Present   Feeling of Stress : Only a little  Social Connections: Socially Isolated   Frequency of Communication with Friends and Family: More than three times a week   Frequency of Social Gatherings with Friends and Family: Once a week   Attends Religious Services: Never   Marine scientist or Organizations: No   Attends Music therapist: Never   Marital Status: Never married     Family History: The patient's family history includes Alzheimer's disease in his father; Aortic stenosis in his mother; CAD in his maternal grandmother; Cancer in his mother; Cancer - Colon in his mother; Cerebrovascular Accident in  his mother; Colon cancer in his mother; Congestive Heart Failure in his maternal grandmother; Coronary artery disease in his maternal grandmother; Diabetes in his father and mother; Diabetes Mellitus II in his father and mother; Esophageal cancer in his paternal uncle; Heart attack in his maternal grandmother; Heart failure in his maternal grandmother; Hyperlipidemia in his father, maternal grandmother, and mother; Hypertension in his mother; Stroke in his mother; Valvular heart disease in his mother. ROS:   Please see the history of present illness.    All other systems reviewed and are negative.  EKGs/Labs/Other Studies Reviewed:    The following studies were reviewed today:  EKG:  EKG ordered 08/04/2021 independently reviewed from his PCP office sinus rhythm and was normal  Recent Labs: 09/25/2020: TSH 3.150 09/28/2020: Brain Natriuretic Peptide 30 04/17/2021: ALT 32; Hemoglobin 14.6; Platelets 239 04/26/2021: BUN 15; Creatinine, Ser 1.10; Potassium 4.1; Sodium 141  Recent Lipid Panel    Component Value Date/Time   CHOL 141 04/17/2021 0944   TRIG 99 04/17/2021 0944   HDL 57 04/17/2021 0944   CHOLHDL 2.5 04/17/2021 0944   LDLCALC 66 04/17/2021 0944    Physical Exam:    VS:  BP 132/82   Pulse 88   Ht 6' (1.829 m)   Wt 205 lb (93 kg)   SpO2 95%   BMI 27.80 kg/m     Wt Readings from Last 3 Encounters:  09/13/21 205 lb (93 kg)  09/12/21 202 lb (91.6 kg)  08/01/21 200 lb 3.2 oz (90.8 kg)     GEN: He is breathless at rest tachypneic well nourished, well developed in no acute distress HEENT: Normal NECK: No JVD; No carotid bruits LYMPHATICS: No lymphadenopathy CARDIAC: I cannot auscultate aortic regurgitation RRR,  he has a soft third heart sound RESPIRATORY:  Clear to auscultation without rales, wheezing or rhonchi  ABDOMEN: Soft, non-tender, non-distended MUSCULOSKELETAL: He has 1-2+ peripheral edema edema; No deformity  SKIN: Warm and dry NEUROLOGIC:  Alert and oriented x  3 PSYCHIATRIC:  Normal affect    Signed, Shirlee More, MD  09/13/2021 12:11 PM    [facility]

## 2021-09-13 ENCOUNTER — Other Ambulatory Visit: Payer: Self-pay

## 2021-09-13 ENCOUNTER — Ambulatory Visit: Payer: Medicare HMO | Admitting: Cardiology

## 2021-09-13 ENCOUNTER — Encounter: Payer: Self-pay | Admitting: Cardiology

## 2021-09-13 VITALS — BP 132/82 | HR 88 | Ht 72.0 in | Wt 205.0 lb

## 2021-09-13 DIAGNOSIS — R0602 Shortness of breath: Secondary | ICD-10-CM | POA: Diagnosis not present

## 2021-09-13 DIAGNOSIS — I25118 Atherosclerotic heart disease of native coronary artery with other forms of angina pectoris: Secondary | ICD-10-CM | POA: Diagnosis not present

## 2021-09-13 DIAGNOSIS — I351 Nonrheumatic aortic (valve) insufficiency: Secondary | ICD-10-CM

## 2021-09-13 DIAGNOSIS — I10 Essential (primary) hypertension: Secondary | ICD-10-CM

## 2021-09-13 DIAGNOSIS — I7121 Aneurysm of the ascending aorta, without rupture: Secondary | ICD-10-CM

## 2021-09-13 MED ORDER — FUROSEMIDE 20 MG PO TABS: 20.0000 mg | ORAL_TABLET | Freq: Every day | ORAL | 3 refills | Status: DC

## 2021-09-13 NOTE — Patient Instructions (Signed)
Medication Instructions:  Your physician has recommended you make the following change in your medication:   START: Lasix 20 mg daily (if weight does not fall 5 pounds in 2 days then increase to 2 = 40 mg daily)  *If you need a refill on your cardiac medications before your next appointment, please call your pharmacy*   Lab Work: Your physician recommends that you return for lab work in:   Labs today: Pro BNP, BMP  If you have labs (blood work) drawn today and your tests are completely normal, you will receive your results only by: MyChart Message (if you have MyChart) OR A paper copy in the mail If you have any lab test that is abnormal or we need to change your treatment, we will call you to review the results.   Testing/Procedures: None   Follow-Up: At [facility], you and your health needs are our priority.  As part of our continuing mission to provide you with exceptional heart care, we have created designated Provider Care Teams.  These Care Teams include your primary Cardiologist (physician) and Advanced Practice Providers (APPs -  Physician Assistants and Nurse Practitioners) who all work together to provide you with the care you need, when you need it.  We recommend signing up for the patient portal called "MyChart".  Sign up information is provided on this After Visit Summary.  MyChart is used to connect with patients for Virtual Visits (Telemedicine).  Patients are able to view lab/test results, encounter notes, upcoming appointments, etc.  Non-urgent messages can be sent to your provider as well.   To learn more about what you can do with MyChart, go to [email or web address].    Your next appointment:   2 week(s)  The format for your next appointment:   In Person  Provider:   Shirlee More, MD    Other Instructions None  Important Information About Sugar

## 2021-09-14 LAB — BASIC METABOLIC PANEL
BUN/Creatinine Ratio: 12 (ref 10–24)
BUN: 13 mg/dL (ref 8–27)
CO2: 18 mmol/L — ABNORMAL LOW (ref 20–29)
Calcium: 9.6 mg/dL (ref 8.6–10.2)
Chloride: 104 mmol/L (ref 96–106)
Creatinine, Ser: 1.13 mg/dL (ref 0.76–1.27)
Glucose: 149 mg/dL — ABNORMAL HIGH (ref 70–99)
Potassium: 4 mmol/L (ref 3.5–5.2)
Sodium: 139 mmol/L (ref 134–144)
eGFR: 71 mL/min/{1.73_m2} (ref >59)

## 2021-09-14 LAB — PRO B NATRIURETIC PEPTIDE: NT-Pro BNP: 112 pg/mL (ref 0–376)

## 2021-09-16 ENCOUNTER — Telehealth: Payer: Self-pay

## 2021-09-16 NOTE — Telephone Encounter (Signed)
Called patient to inform them of their lab results and they had a question regarding their chest x-ray. I did not see any orders in the computer for a chest x-ray. Patient stated that Dr. Bettina Gavia had called Dr. Tobie Poet and asked one of her staff to order the chest x - ray and they were supposed to call him today and remind him of the procedure. Since it is after 5 pm and their is no order in Epic I instructed the patient to go to Dr. Alyse Low office in the morning to find out more information about his chest x-ray. Patient agreed.

## 2021-09-17 ENCOUNTER — Other Ambulatory Visit: Payer: Medicare HMO

## 2021-09-24 ENCOUNTER — Ambulatory Visit (INDEPENDENT_AMBULATORY_CARE_PROVIDER_SITE_OTHER): Payer: Medicare HMO

## 2021-09-24 DIAGNOSIS — R0602 Shortness of breath: Secondary | ICD-10-CM | POA: Diagnosis not present

## 2021-09-24 LAB — ECHOCARDIOGRAM COMPLETE
Area-P 1/2: 3.89 cm2
P 1/2 time: 696 msec
S' Lateral: 3.3 cm

## 2021-09-25 DIAGNOSIS — I25118 Atherosclerotic heart disease of native coronary artery with other forms of angina pectoris: Secondary | ICD-10-CM

## 2021-09-25 NOTE — Addendum Note (Signed)
Addended byShirlee More on: 09/25/2021 05:08 PM   Modules accepted: Orders

## 2021-09-26 ENCOUNTER — Telehealth: Payer: Self-pay

## 2021-09-26 NOTE — Telephone Encounter (Signed)
Called patient to review his cardiac cath instructions with him. Patient stated that he is unable to have the cardiac cath in the morning because he has to arrange for transportation to [facility] in [place] and he is unable to do that at 5:30 am. Patient stated that he would prefer to have his cardiac cath in the afternoon due to his travel concerns. I explained that I would call and reschedule his cath on Monday and the patient was fine with that. Patient had no further questions.

## 2021-09-28 NOTE — Progress Notes (Deleted)
Cardiology Office Note:    Date:  09/28/2021   ID:  Joe Arroyo, DOB 27-Oct-1955, MRN [id number]  PCP:  [name], MD  Cardiologist:  Shirlee More, MD    Referring MD: [name], MD    ASSESSMENT:    No diagnosis found. PLAN:    In order of problems listed above:  ***   Next appointment: ***   Medication Adjustments/Labs and Tests Ordered: Current medicines are reviewed at length with the patient today.  Concerns regarding medicines are outlined above.  No orders of the defined types were placed in this encounter.  No orders of the defined types were placed in this encounter.   No chief complaint on file.   History of Present Illness:    Joe Arroyo is a 66 y.o. male with a hx of *** last seen ***. 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   Shortness of breath 09/05/2019   Sinus bradycardia 05/14/2018   Syncope 09/05/2019   Thoracic ascending aortic aneurysm (HCC)    ascending TAA 4.6 x 4.4 cm 10/03/20 CTA chest    Past Surgical History:  Procedure Laterality Date   COLONOSCOPY     ESOPHAGOGASTRODUODENOSCOPY N/A 12/05/2020   Procedure: ESOPHAGOGASTRODUODENOSCOPY (EGD);  Surgeon: Lajuana Matte, MD;  Location: [facility] OR;  Service: Thoracic;  Laterality: N/A;   LAMINECTOMY     MOLE REMOVAL     PROSTATE BIOPSY     TONSILLECTOMY AND ADENOIDECTOMY     UPPER GASTROINTESTINAL ENDOSCOPY     XI ROBOTIC ASSISTED  PARAESOPHAGEAL HERNIA REPAIR N/A 12/05/2020   Procedure: XI ROBOTIC ASSISTED PARAESOPHAGEAL HERNIA REPAIR AND FUNDOPLICATION;  Surgeon: Lajuana Matte, MD;  Location: [facility];  Service: Thoracic;  Laterality: N/A;    Current Medications: No outpatient medications have been marked as taking for the 10/01/21 encounter (Appointment) with Richardo Priest, MD.     Allergies:   Bystolic [nebivolol hcl], Carvedilol, Pravastatin, Hctz [hydrochlorothiazide], Hydralazine, and Penicillins   Social History   Socioeconomic History   Marital status: Single    Spouse name: Not on file   Number of children: Not on file   Years of education: Not on file   Highest education level: Not on file  Occupational History   Not on file  Tobacco Use   Smoking status: Never   Smokeless tobacco: Never  Vaping Use   Vaping Use: Never used  Substance and Sexual Activity   Alcohol use: Yes    Comment: occasional   Drug use: Never   Sexual activity: Not on file  Other Topics Concern   Not on file  Social History Narrative   Not on file   Social Determinants of Health   Financial Resource Strain: Low Risk  (09/12/2021)   Overall Financial Resource Strain (CARDIA)    Difficulty of Paying Living Expenses: Not hard at all  Food Insecurity: No Food Insecurity (09/12/2021)   Hunger Vital Sign    Worried About  Running Out of Food in the Last Year: Never true    [place] in the Last Year: Never true  Transportation Needs: No Transportation Needs (09/12/2021)   PRAPARE - Hydrologist (Medical): No    Lack of Transportation (Non-Medical): No  Physical Activity: Unknown (09/12/2021)   Exercise Vital Sign    Days of Exercise per Week: 0 days    Minutes of Exercise per Session: Not on file  Stress: No Stress Concern Present (09/12/2021)   [place]    Feeling of Stress : Only a little  Social Connections: Socially  Isolated (09/12/2021)   Social Connection and Isolation Panel [NHANES]    Frequency of Communication with Friends and Family: More than three times a week    Frequency of Social Gatherings with Friends and Family: Once a week    Attends Religious Services: Never    Marine scientist or Organizations: No    Attends Music therapist: Never    Marital Status: Never married     Family History: The patient's ***family history includes Alzheimer's disease in his father; Aortic stenosis in his mother; CAD in his maternal grandmother; Cancer in his mother; Cancer - Colon in his mother; Cerebrovascular Accident in his mother; Colon cancer in his mother; Congestive Heart Failure in his maternal grandmother; Coronary artery disease in his maternal grandmother; Diabetes in his father and mother; Diabetes Mellitus II in his father and mother; Esophageal cancer in his paternal uncle; Heart attack in his maternal grandmother; Heart failure in his maternal grandmother; Hyperlipidemia in his father, maternal grandmother, and mother; Hypertension in his mother; Stroke in his mother; Valvular heart disease in his mother. ROS:   Please see the history of present illness.    All other systems reviewed and are negative.  EKGs/Labs/Other Studies Reviewed:    The following studies were reviewed today:  EKG:  EKG ordered today and personally reviewed.  The ekg ordered today demonstrates ***  Recent Labs: 09/28/2020: Brain Natriuretic Peptide 30 04/17/2021: ALT 32; Hemoglobin 14.6; Platelets 239 09/13/2021: BUN 13; Creatinine, Ser 1.13; NT-Pro BNP 112; Potassium 4.0; Sodium 139  Recent Lipid Panel    Component Value Date/Time   CHOL 141 04/17/2021 0944   TRIG 99 04/17/2021 0944   HDL 57 04/17/2021 0944   CHOLHDL 2.5 04/17/2021 0944   LDLCALC 66 04/17/2021 0944    Physical Exam:    VS:  There were no vitals taken for this visit.    Wt Readings from Last 3 Encounters:  09/13/21 205 lb (93 kg)   09/12/21 202 lb (91.6 kg)  08/01/21 200 lb 3.2 oz (90.8 kg)     GEN: *** Well nourished, well developed in no acute distress HEENT: Normal NECK: No JVD; No carotid bruits LYMPHATICS: No lymphadenopathy CARDIAC: ***RRR, no murmurs, rubs, gallops RESPIRATORY:  Clear to auscultation without rales, wheezing or rhonchi  ABDOMEN: Soft, non-tender, non-distended MUSCULOSKELETAL:  No edema; No deformity  SKIN: Warm and dry NEUROLOGIC:  Alert and oriented x 3 PSYCHIATRIC:  Normal affect    Signed, Shirlee More, MD  09/28/2021 4:01 PM    [facility]

## 2021-10-01 ENCOUNTER — Ambulatory Visit: Payer: Medicare HMO | Admitting: Cardiology

## 2021-10-03 ENCOUNTER — Telehealth: Payer: Self-pay | Admitting: *Deleted

## 2021-10-03 NOTE — Telephone Encounter (Addendum)
Cardiac Catheterization scheduled at [facility] for: Monday October 07, 2021 10:30 AM Arrival time and place: [place] Entrance A at: 8:30 AM-pt needs CBC-pt reports unable to get before cath Procedure time change at patient's request due to transportation.   Nothing to eat after midnight prior to procedure, clear liquids until 5 AM day of procedure.  Medication instructions: -Hold:  Lasix-AM of procedure  -Except hold medication usual morning medications can be taken with sips of water including aspirin 81 mg.  Confirmed patient has responsible adult to drive home post procedure and be with patient first 24 hours after arriving home.  Patient reports no new symptoms concerning for COVID-19 in the past 10 days.  Reviewed procedure instructions with patient.

## 2021-10-03 NOTE — Telephone Encounter (Signed)
10/03/21-Per Dr Earl Many and Left Heart Cath.

## 2021-10-07 ENCOUNTER — Other Ambulatory Visit (HOSPITAL_COMMUNITY): Payer: Self-pay

## 2021-10-07 ENCOUNTER — Ambulatory Visit (HOSPITAL_COMMUNITY)
Admission: RE | Admit: 2021-10-07 | Discharge: 2021-10-07 | Disposition: A | Discharge status: Home / Self Care | Payer: Medicare HMO | Source: Ambulatory Visit | Attending: Cardiology | Admitting: Cardiology

## 2021-10-07 ENCOUNTER — Other Ambulatory Visit: Payer: Self-pay

## 2021-10-07 ENCOUNTER — Encounter (HOSPITAL_COMMUNITY)
Admission: RE | Disposition: A | Discharge status: Home / Self Care | Payer: Medicare HMO | Source: Ambulatory Visit | Attending: Cardiology

## 2021-10-07 DIAGNOSIS — I25118 Atherosclerotic heart disease of native coronary artery with other forms of angina pectoris: Secondary | ICD-10-CM | POA: Diagnosis not present

## 2021-10-07 DIAGNOSIS — R0602 Shortness of breath: Secondary | ICD-10-CM | POA: Insufficient documentation

## 2021-10-07 DIAGNOSIS — I7121 Aneurysm of the ascending aorta, without rupture: Secondary | ICD-10-CM | POA: Diagnosis not present

## 2021-10-07 DIAGNOSIS — Z01812 Encounter for preprocedural laboratory examination: Secondary | ICD-10-CM

## 2021-10-07 DIAGNOSIS — E782 Mixed hyperlipidemia: Secondary | ICD-10-CM

## 2021-10-07 DIAGNOSIS — Z955 Presence of coronary angioplasty implant and graft: Secondary | ICD-10-CM | POA: Diagnosis not present

## 2021-10-07 DIAGNOSIS — R931 Abnormal findings on diagnostic imaging of heart and coronary circulation: Secondary | ICD-10-CM

## 2021-10-07 DIAGNOSIS — R600 Localized edema: Secondary | ICD-10-CM | POA: Diagnosis not present

## 2021-10-07 DIAGNOSIS — I2510 Atherosclerotic heart disease of native coronary artery without angina pectoris: Secondary | ICD-10-CM

## 2021-10-07 DIAGNOSIS — I351 Nonrheumatic aortic (valve) insufficiency: Secondary | ICD-10-CM | POA: Diagnosis not present

## 2021-10-07 DIAGNOSIS — I10 Essential (primary) hypertension: Secondary | ICD-10-CM | POA: Insufficient documentation

## 2021-10-07 DIAGNOSIS — Z79899 Other long term (current) drug therapy: Secondary | ICD-10-CM | POA: Diagnosis not present

## 2021-10-07 DIAGNOSIS — E785 Hyperlipidemia, unspecified: Secondary | ICD-10-CM | POA: Insufficient documentation

## 2021-10-07 DIAGNOSIS — G4733 Obstructive sleep apnea (adult) (pediatric): Secondary | ICD-10-CM | POA: Insufficient documentation

## 2021-10-07 HISTORY — PX: RIGHT/LEFT HEART CATH AND CORONARY ANGIOGRAPHY: CATH118266

## 2021-10-07 HISTORY — PX: CORONARY STENT INTERVENTION: CATH118234

## 2021-10-07 LAB — POCT I-STAT EG7
Acid-Base Excess: 2 mmol/L (ref 0.0–2.0)
Acid-Base Excess: 2 mmol/L (ref 0.0–2.0)
Bicarbonate: 24.2 mmol/L (ref 20.0–28.0)
Bicarbonate: 24.5 mmol/L (ref 20.0–28.0)
Calcium, Ion: 1.12 mmol/L — ABNORMAL LOW (ref 1.15–1.40)
Calcium, Ion: 1.18 mmol/L (ref 1.15–1.40)
HCT: 37 % — ABNORMAL LOW (ref 39.0–52.0)
HCT: 38 % — ABNORMAL LOW (ref 39.0–52.0)
Hemoglobin: 12.6 g/dL — ABNORMAL LOW (ref 13.0–17.0)
Hemoglobin: 12.9 g/dL — ABNORMAL LOW (ref 13.0–17.0)
O2 Saturation: 69 %
O2 Saturation: 70 %
Potassium: 2.9 mmol/L — ABNORMAL LOW (ref 3.5–5.1)
Potassium: 3 mmol/L — ABNORMAL LOW (ref 3.5–5.1)
Sodium: 142 mmol/L (ref 135–145)
Sodium: 144 mmol/L (ref 135–145)
TCO2: 25 mmol/L (ref 22–32)
TCO2: 25 mmol/L (ref 22–32)
pCO2, Ven: 30.6 mmHg — ABNORMAL LOW (ref 44–60)
pCO2, Ven: 30.8 mmHg — ABNORMAL LOW (ref 44–60)
pH, Ven: 7.507 — ABNORMAL HIGH (ref 7.25–7.43)
pH, Ven: 7.509 — ABNORMAL HIGH (ref 7.25–7.43)
pO2, Ven: 32 mmHg (ref 32–45)
pO2, Ven: 32 mmHg (ref 32–45)

## 2021-10-07 LAB — POCT I-STAT 7, (LYTES, BLD GAS, ICA,H+H)
Acid-Base Excess: 3 mmol/L — ABNORMAL HIGH (ref 0.0–2.0)
Bicarbonate: 23.2 mmol/L (ref 20.0–28.0)
Calcium, Ion: 1.17 mmol/L (ref 1.15–1.40)
HCT: 38 % — ABNORMAL LOW (ref 39.0–52.0)
Hemoglobin: 12.9 g/dL — ABNORMAL LOW (ref 13.0–17.0)
O2 Saturation: 99 %
Potassium: 3.2 mmol/L — ABNORMAL LOW (ref 3.5–5.1)
Sodium: 141 mmol/L (ref 135–145)
TCO2: 24 mmol/L (ref 22–32)
pCO2 arterial: 23.6 mmHg — ABNORMAL LOW (ref 32–48)
pH, Arterial: 7.599 — ABNORMAL HIGH (ref 7.35–7.45)
pO2, Arterial: 116 mmHg — ABNORMAL HIGH (ref 83–108)

## 2021-10-07 LAB — CBC
HCT: 45 % (ref 39.0–52.0)
Hemoglobin: 14.5 g/dL (ref 13.0–17.0)
MCH: 27.2 pg (ref 26.0–34.0)
MCHC: 32.2 g/dL (ref 30.0–36.0)
MCV: 84.4 fL (ref 80.0–100.0)
Platelets: 268 10*3/uL (ref 150–400)
RBC: 5.33 MIL/uL (ref 4.22–5.81)
RDW: 15 % (ref 11.5–15.5)
WBC: 9.1 10*3/uL (ref 4.0–10.5)
nRBC: 0 % (ref 0.0–0.2)

## 2021-10-07 LAB — POCT ACTIVATED CLOTTING TIME: Activated Clotting Time: 257 seconds

## 2021-10-07 SURGERY — RIGHT/LEFT HEART CATH AND CORONARY ANGIOGRAPHY
Anesthesia: LOCAL

## 2021-10-07 MED ORDER — CLOPIDOGREL BISULFATE 75 MG PO TABS
ORAL_TABLET | ORAL | Status: DC | PRN
  Administered 2021-10-07: 600 mg via ORAL

## 2021-10-07 MED ORDER — HEPARIN (PORCINE) IN NACL 1000-0.9 UT/500ML-% IV SOLN
INTRAVENOUS | Status: DC | PRN
  Administered 2021-10-07 (×2): 500 mL

## 2021-10-07 MED ORDER — ASPIRIN 81 MG PO CHEW: 81.0000 mg | CHEWABLE_TABLET | ORAL | Status: DC

## 2021-10-07 MED ORDER — SODIUM CHLORIDE 0.9% FLUSH: 3.0000 mL | INTRAVENOUS | Status: DC | PRN

## 2021-10-07 MED ORDER — HEPARIN SODIUM (PORCINE) 1000 UNIT/ML IJ SOLN
INTRAMUSCULAR | Status: AC
Start: 2021-10-07 — End: ?
  Filled 2021-10-07: qty 10

## 2021-10-07 MED ORDER — CLOPIDOGREL BISULFATE 75 MG PO TABS
ORAL_TABLET | ORAL | Status: AC
  Filled 2021-10-07: qty 7

## 2021-10-07 MED ORDER — PANTOPRAZOLE SODIUM 40 MG PO TBEC: 40.0000 mg | DELAYED_RELEASE_TABLET | Freq: Every day | ORAL | 0 refills | Status: DC

## 2021-10-07 MED ORDER — FAMOTIDINE IN NACL 20-0.9 MG/50ML-% IV SOLN
INTRAVENOUS | Status: DC | PRN
  Administered 2021-10-07: 20 mg via INTRAVENOUS

## 2021-10-07 MED ORDER — SODIUM CHLORIDE 0.9% FLUSH: 3.0000 mL | Freq: Two times a day (BID) | INTRAVENOUS | Status: DC

## 2021-10-07 MED ORDER — SODIUM CHLORIDE 0.9 % IV SOLN: 250.0000 mL | INTRAVENOUS | Status: DC | PRN

## 2021-10-07 MED ORDER — CLOPIDOGREL BISULFATE 75 MG PO TABS
75.0000 mg | ORAL_TABLET | Freq: Every day | ORAL | 1 refills | Status: AC
  Filled 2021-10-07: qty 90, 90d supply, fill #0

## 2021-10-07 MED ORDER — MORPHINE SULFATE (PF) 2 MG/ML IV SOLN: 1.0000 mg | INTRAVENOUS | Status: DC | PRN

## 2021-10-07 MED ORDER — HEPARIN SODIUM (PORCINE) 1000 UNIT/ML IJ SOLN
INTRAMUSCULAR | Status: DC | PRN
  Administered 2021-10-07: 2000 [IU] via INTRAVENOUS
  Administered 2021-10-07: 7500 [IU] via INTRAVENOUS

## 2021-10-07 MED ORDER — MIDAZOLAM HCL 2 MG/2ML IJ SOLN
INTRAMUSCULAR | Status: DC | PRN
  Administered 2021-10-07: 1 mg via INTRAVENOUS

## 2021-10-07 MED ORDER — LIDOCAINE HCL (PF) 1 % IJ SOLN
INTRAMUSCULAR | Status: DC | PRN
  Administered 2021-10-07 (×2): 2 mL via INTRADERMAL

## 2021-10-07 MED ORDER — CLOPIDOGREL BISULFATE 75 MG PO TABS
ORAL_TABLET | ORAL | Status: AC
  Filled 2021-10-07: qty 1

## 2021-10-07 MED ORDER — ROSUVASTATIN CALCIUM 40 MG PO TABS: 40.0000 mg | ORAL_TABLET | Freq: Every day | ORAL | 0 refills | Status: DC

## 2021-10-07 MED ORDER — HEPARIN (PORCINE) IN NACL 1000-0.9 UT/500ML-% IV SOLN
INTRAVENOUS | Status: AC
Start: 2021-10-07 — End: ?
  Filled 2021-10-07: qty 1000

## 2021-10-07 MED ORDER — CLOPIDOGREL BISULFATE 75 MG PO TABS: 75.0000 mg | ORAL_TABLET | Freq: Every day | ORAL | Status: DC

## 2021-10-07 MED ORDER — SODIUM CHLORIDE 0.9 % WEIGHT BASED INFUSION: 1.0000 mL/kg/h | INTRAVENOUS | Status: DC

## 2021-10-07 MED ORDER — LIDOCAINE HCL (PF) 1 % IJ SOLN
INTRAMUSCULAR | Status: AC
  Filled 2021-10-07: qty 30

## 2021-10-07 MED ORDER — SODIUM CHLORIDE 0.9 % IV BOLUS
INTRAVENOUS | Status: DC | PRN
  Administered 2021-10-07: 250 mL via INTRAVENOUS

## 2021-10-07 MED ORDER — VERAPAMIL HCL 2.5 MG/ML IV SOLN
INTRAVENOUS | Status: AC
  Filled 2021-10-07: qty 2

## 2021-10-07 MED ORDER — NITROGLYCERIN 1 MG/10 ML FOR IR/CATH LAB
INTRA_ARTERIAL | Status: DC | PRN
  Administered 2021-10-07: 200 ug via INTRACORONARY

## 2021-10-07 MED ORDER — ASPIRIN 81 MG PO CHEW: 81.0000 mg | CHEWABLE_TABLET | Freq: Every day | ORAL | Status: DC

## 2021-10-07 MED ORDER — FENTANYL CITRATE (PF) 100 MCG/2ML IJ SOLN
INTRAMUSCULAR | Status: DC | PRN
  Administered 2021-10-07: 25 ug via INTRAVENOUS

## 2021-10-07 MED ORDER — VERAPAMIL HCL 2.5 MG/ML IV SOLN
INTRAVENOUS | Status: DC | PRN
  Administered 2021-10-07: 10 mL via INTRA_ARTERIAL

## 2021-10-07 MED ORDER — ONDANSETRON HCL 4 MG/2ML IJ SOLN: 4.0000 mg | Freq: Four times a day (QID) | INTRAMUSCULAR | Status: DC | PRN

## 2021-10-07 MED ORDER — FAMOTIDINE IN NACL 20-0.9 MG/50ML-% IV SOLN
INTRAVENOUS | Status: AC
Start: 2021-10-07 — End: ?
  Filled 2021-10-07: qty 50

## 2021-10-07 MED ORDER — MIDAZOLAM HCL 2 MG/2ML IJ SOLN
INTRAMUSCULAR | Status: AC
  Filled 2021-10-07: qty 2

## 2021-10-07 MED ORDER — NITROGLYCERIN 1 MG/10 ML FOR IR/CATH LAB
INTRA_ARTERIAL | Status: AC
Start: 2021-10-07 — End: ?
  Filled 2021-10-07: qty 10

## 2021-10-07 MED ORDER — SODIUM CHLORIDE 0.9 % WEIGHT BASED INFUSION
3.0000 mL/kg/h | INTRAVENOUS | Status: AC
  Administered 2021-10-07: 3 mL/kg/h via INTRAVENOUS

## 2021-10-07 MED ORDER — FENTANYL CITRATE (PF) 100 MCG/2ML IJ SOLN
INTRAMUSCULAR | Status: AC
  Filled 2021-10-07: qty 2

## 2021-10-07 MED ORDER — ACETAMINOPHEN 325 MG PO TABS: 650.0000 mg | ORAL_TABLET | ORAL | Status: DC | PRN

## 2021-10-07 SURGICAL SUPPLY — 23 items
BALL SAPPHIRE NC24 2.50X15 (BALLOONS) ×2
BALLN SAPPHIRE 2.0X15 (BALLOONS) ×2
BALLOON SAPPHIRE 2.0X15 (BALLOONS) IMPLANT
BALLOON SAPPHIRE NC24 2.50X15 (BALLOONS) IMPLANT
BAND ZEPHYR COMPRESS 30 LONG (HEMOSTASIS) ×1 IMPLANT
CATH BALLN WEDGE 5F 110CM (CATHETERS) ×1 IMPLANT
CATH LAUNCHER 6FR JR4 (CATHETERS) ×1 IMPLANT
CATH OPTITORQUE TIG 4.0 5F (CATHETERS) ×1 IMPLANT
ELECT DEFIB PAD ADLT CADENCE (PAD) ×1 IMPLANT
GLIDESHEATH SLEND SS 6F .021 (SHEATH) ×1 IMPLANT
GUIDEWIRE INQWIRE 1.5J.035X260 (WIRE) IMPLANT
INQWIRE 1.5J .035X260CM (WIRE) ×4
KIT ENCORE 26 ADVANTAGE (KITS) ×1 IMPLANT
KIT HEART LEFT (KITS) ×2 IMPLANT
PACK CARDIAC CATHETERIZATION (CUSTOM PROCEDURE TRAY) ×2 IMPLANT
SHEATH GLIDE SLENDER 4/5FR (SHEATH) ×1 IMPLANT
SHEATH PROBE COVER 6X72 (BAG) ×1 IMPLANT
STENT SYNERGY XD 2.25X24 (Permanent Stent) IMPLANT
SYNERGY XD 2.25X24 (Permanent Stent) ×2 IMPLANT
SYR MEDRAD MARK 7 150ML (SYRINGE) ×2 IMPLANT
TRANSDUCER W/STOPCOCK (MISCELLANEOUS) ×2 IMPLANT
TUBING CIL FLEX 10 FLL-RA (TUBING) ×2 IMPLANT
WIRE ASAHI PROWATER 180CM (WIRE) ×1 IMPLANT

## 2021-10-07 NOTE — Progress Notes (Signed)
Seen pt from [id number] pt was educated on stent procedure, Aspirin & Plavix med use, restrictions, ex guidelines, NTG use, heart healthy diet, and CRPII. Pt is being referred to [facility] in [place].    Joe Arroyo  10/07/2021 1:29 PM

## 2021-10-08 ENCOUNTER — Encounter (HOSPITAL_COMMUNITY): Payer: Self-pay | Admitting: Cardiology

## 2021-10-23 ENCOUNTER — Other Ambulatory Visit: Payer: Self-pay

## 2021-10-23 DIAGNOSIS — E782 Mixed hyperlipidemia: Secondary | ICD-10-CM

## 2021-10-23 MED ORDER — ROSUVASTATIN CALCIUM 40 MG PO TABS: 40.0000 mg | ORAL_TABLET | Freq: Every day | ORAL | 0 refills | Status: DC

## 2021-10-23 MED ORDER — MELOXICAM 15 MG PO TABS: 15.0000 mg | ORAL_TABLET | Freq: Every day | ORAL | 0 refills | Status: DC | PRN

## 2021-10-28 NOTE — Progress Notes (Unsigned)
Cardiology Office Note:    Date:  10/29/2021   ID:  Joe Arroyo, DOB [birth year], MRN [id number]  PCP:  [name], MD  Cardiologist:  Shirlee More, MD    Referring MD: [name], MD    ASSESSMENT:    1. Coronary artery disease involving native coronary artery of native heart with other form of angina pectoris ([place])   2. Shortness of breath   3. Mixed hyperlipidemia   4. Resistant hypertension   5. Aneurysm of ascending aorta without rupture (HCC)    PLAN:    In order of problems listed above:  He is improved after PCI denies angina we will continue his dual antiplatelet therapy I told him he can stop Protonix go back to his Nexium. Continued signs and symptoms of diastolic heart failure increase his loop diuretic recheck renal function potassium and proBNP level Continue his other antihypertensive agents including clonidine and calcium channel blocker Continue statin Stable aortic dimension actually improved on his cardiac CTA   Next appointment: 4 months   Medication Adjustments/Labs and Tests Ordered: Current medicines are reviewed at length with the patient today.  Concerns regarding medicines are outlined above.  No orders of the defined types were placed in this encounter.  No orders of the defined types were placed in this encounter.   Chief Complaint  Patient presents with   Hospitalization Follow-up   Follow-up   Coronary Artery Disease    History of Present Illness:    Joe Arroyo is a 66 y.o. male with a hx of resistant hypertension stable ascending aortic aneurysm 44 mm obstructive sleep apnea and coronary artery disease from cardiac CTA showing stenosis in the right-sided PDA with abnormal FFR last seen 09/13/2021 and referred for heart catheterization  Compliance with diet, lifestyle and medications: Yes  His cardiac intervention is improved having no angina but is breathless with activities still has edema and orthopnea. We will recheck his  renal function and increase his diuretic to twice daily. No chest pain palpitation or syncope  He had left and right heart catheterization 10/07/2021 he had PCI and drug-eluting stent to the posterior lateral branch right coronary artery otherwise angiographically normal coronaries.  Right heart catheterization was normal except for mildly reduced cardiac index. 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   Shortness of breath 09/05/2019   Sinus bradycardia 05/14/2018   Syncope 09/05/2019   Thoracic ascending aortic aneurysm (HCC)    ascending TAA 4.6 x 4.4 cm 10/03/20 CTA chest    Past Surgical History:  Procedure Laterality Date   COLONOSCOPY     CORONARY STENT INTERVENTION N/A 10/07/2021   Procedure: CORONARY STENT INTERVENTION;  Surgeon: Leonie Man, MD;  Location: [facility];  Service: Cardiovascular;  Laterality: N/A;   ESOPHAGOGASTRODUODENOSCOPY N/A 12/05/2020   Procedure: ESOPHAGOGASTRODUODENOSCOPY (EGD);  Surgeon: Lajuana Matte, MD;  Location: [place];  Service: Thoracic;  Laterality: N/A;   [place]  BIOPSY     RIGHT/LEFT HEART CATH AND CORONARY ANGIOGRAPHY N/A 10/07/2021   Procedure: RIGHT/LEFT HEART CATH AND CORONARY ANGIOGRAPHY;  Surgeon: Leonie Man, MD;  Location: [facility];  Service: Cardiovascular;  Laterality: N/A;   TONSILLECTOMY AND ADENOIDECTOMY     UPPER GASTROINTESTINAL ENDOSCOPY     XI ROBOTIC ASSISTED  PARAESOPHAGEAL HERNIA REPAIR N/A 12/05/2020   Procedure: XI ROBOTIC ASSISTED PARAESOPHAGEAL HERNIA REPAIR AND FUNDOPLICATION;  Surgeon: Lajuana Matte, MD;  Location: [facility] OR;  Service: Thoracic;  Laterality: N/A;    Current Medications: Current Meds  Medication Sig   amLODipine (NORVASC) 5 MG tablet Take 1 tablet (5 mg total) by mouth daily.   aspirin EC 81 MG tablet Take 1 tablet (81 mg total) by mouth daily. Swallow whole.   cloNIDine (CATAPRES) 0.3 MG tablet Take 1 tablet (0.3 mg total) by mouth daily.   clopidogrel (PLAVIX) 75 MG tablet Take 1 tablet (75 mg total) by mouth daily.   ezetimibe (ZETIA) 10 MG tablet Take 1 tablet (10 mg total) by mouth daily.   finasteride (PROSCAR) 5 MG tablet Take 5 mg by mouth daily.   furosemide (LASIX) 20 MG tablet Take 20 mg by mouth daily. Take 1 extra pill when don't see change   meloxicam (MOBIC) 15 MG tablet Take 1 tablet (15 mg total) by mouth daily as needed.   Multiple Vitamins-Minerals (CENTRUM SILVER ULTRA MENS PO) Take 1 tablet by mouth daily.   Omega-3 Fatty Acids (FISH OIL PO) Take 1 tablet by mouth 2 (two) times daily.   pantoprazole (PROTONIX) 40 MG tablet Take 1 tablet (40 mg total) by mouth daily.   rosuvastatin (CRESTOR) 40 MG tablet Take 1 tablet (40 mg total) by mouth daily.     Allergies:   Bystolic [nebivolol hcl], Carvedilol, Pravastatin, Hctz [hydrochlorothiazide], Hydralazine, and Penicillins   Social History   Socioeconomic History   Marital status: Single    Spouse name: Not on file   Number of children: Not on file   Years of education: Not on file   Highest education level: Not on file  Occupational History   Not on file  Tobacco Use   Smoking status: Never   Smokeless tobacco: Never  Vaping Use   Vaping Use: Never used  Substance and Sexual Activity   Alcohol use: Yes    Comment: occasional   Drug use: Never   Sexual activity: Not on file  Other Topics Concern   Not on file  Social History Narrative    Not on file   Social Determinants of Health   Financial Resource Strain: Low Risk  (09/12/2021)   Overall Financial Resource Strain (CARDIA)    Difficulty of Paying Living Expenses: Not hard at all  Food Insecurity: No Food Insecurity (09/12/2021)   Hunger Vital Sign    Worried About Running Out of Food in the Last Year: Never true    Ran Out of Food in the Last Year: Never true  Transportation Needs: No Transportation Needs (09/12/2021)   PRAPARE - Hydrologist (Medical): No    Lack of Transportation (Non-Medical): No  Physical Activity: Unknown (09/12/2021)   Exercise Vital Sign    Days of Exercise per Week: 0 days    Minutes of Exercise per Session: Not on file  Stress: No Stress Concern Present (09/12/2021)   [place]    Feeling of Stress : Only a little  Social Connections: Socially Isolated (09/12/2021)   Social Connection and Isolation Panel [NHANES]    Frequency of Communication with Friends and Family: More than three times a week    Frequency of Social Gatherings with Friends and Family: Once a week    Attends Religious Services: Never    Marine scientist or Organizations: No    Attends Music therapist: Never    Marital Status: Never married     Family History: The patient's family history includes Alzheimer's disease in his father; Aortic stenosis in his mother; CAD in his maternal grandmother; Cancer in his mother; Cancer - Colon in his mother; Cerebrovascular Accident in his mother; Colon cancer in his mother; Congestive Heart Failure in his maternal grandmother; Coronary artery disease in his maternal grandmother; Diabetes in his father and mother; Diabetes Mellitus II in his father and mother; Esophageal cancer in his paternal uncle; Heart attack in his maternal grandmother; Heart failure in his maternal grandmother; Hyperlipidemia in his father, maternal  grandmother, and mother; Hypertension in his mother; Stroke in his mother; Valvular heart disease in his mother. ROS:   Please see the history of present illness.    All other systems reviewed and are negative.  EKGs/Labs/Other Studies Reviewed:    The following studies were reviewed today:   Recent Labs: 04/17/2021: ALT 32 09/13/2021: BUN 13; Creatinine, Ser 1.13; NT-Pro BNP 112 10/07/2021: Hemoglobin 12.6; Hemoglobin 12.9; Platelets 268; Potassium 2.9; Potassium 3.0; Sodium 144; Sodium 142  Recent Lipid Panel    Component Value Date/Time   CHOL 141 04/17/2021 0944   TRIG 99 04/17/2021 0944   HDL 57 04/17/2021 0944   CHOLHDL 2.5 04/17/2021 0944   LDLCALC 66 04/17/2021 0944    Physical Exam:    VS:  BP 130/86 (BP Location: Left Arm, Patient Position: Sitting, Cuff Size: Normal)   Pulse (!) 106   Ht 6' (1.829 m)   Wt 204 lb (92.5 kg)   SpO2 95%   BMI 27.67 kg/m     Wt Readings from Last 3 Encounters:  10/29/21 204 lb (92.5 kg)  10/07/21 195 lb (88.5 kg)  09/13/21 205 lb (93 kg)     GEN:  Well nourished, well developed in no acute distress HEENT: Normal NECK: No JVD; No carotid bruits LYMPHATICS: No lymphadenopathy CARDIAC: RRR, no murmurs, rubs, gallops RESPIRATORY:  Clear to auscultation without rales, wheezing or rhonchi  ABDOMEN: Soft, non-tender, non-distended MUSCULOSKELETAL: +1 bilateral lower extremity pitting edema; No deformity  SKIN: Warm and dry NEUROLOGIC:  Alert and oriented x 3 PSYCHIATRIC:  Normal affect    Signed, Shirlee More, MD  10/29/2021 1:32 PM    [facility]

## 2021-10-29 ENCOUNTER — Encounter: Payer: Self-pay | Admitting: Cardiology

## 2021-10-29 ENCOUNTER — Ambulatory Visit (INDEPENDENT_AMBULATORY_CARE_PROVIDER_SITE_OTHER): Payer: Medicare HMO | Admitting: Cardiology

## 2021-10-29 VITALS — BP 130/86 | HR 106 | Ht 72.0 in | Wt 204.0 lb

## 2021-10-29 DIAGNOSIS — I7121 Aneurysm of the ascending aorta, without rupture: Secondary | ICD-10-CM | POA: Diagnosis not present

## 2021-10-29 DIAGNOSIS — I25118 Atherosclerotic heart disease of native coronary artery with other forms of angina pectoris: Secondary | ICD-10-CM

## 2021-10-29 DIAGNOSIS — I10 Essential (primary) hypertension: Secondary | ICD-10-CM | POA: Diagnosis not present

## 2021-10-29 DIAGNOSIS — E782 Mixed hyperlipidemia: Secondary | ICD-10-CM | POA: Diagnosis not present

## 2021-10-29 DIAGNOSIS — R0602 Shortness of breath: Secondary | ICD-10-CM

## 2021-10-29 MED ORDER — FUROSEMIDE 40 MG PO TABS: 40.0000 mg | ORAL_TABLET | Freq: Two times a day (BID) | ORAL | 3 refills | Status: DC

## 2021-10-29 NOTE — Patient Instructions (Signed)
Medication Instructions:  Your physician has recommended you make the following change in your medication:   START: Furosemide 40 mg twice daily STOP: Protonix  *If you need a refill on your cardiac medications before your next appointment, please call your pharmacy*   Lab Work: Your physician recommends that you return for lab work in:   Labs today: BMP, Pro BNP  If you have labs (blood work) drawn today and your tests are completely normal, you will receive your results only by: MyChart Message (if you have MyChart) OR A paper copy in the mail If you have any lab test that is abnormal or we need to change your treatment, we will call you to review the results.   Testing/Procedures: None   Follow-Up: At [facility], you and your health needs are our priority.  As part of our continuing mission to provide you with exceptional heart care, we have created designated Provider Care Teams.  These Care Teams include your primary Cardiologist (physician) and Advanced Practice Providers (APPs -  Physician Assistants and Nurse Practitioners) who all work together to provide you with the care you need, when you need it.  We recommend signing up for the patient portal called "MyChart".  Sign up information is provided on this After Visit Summary.  MyChart is used to connect with patients for Virtual Visits (Telemedicine).  Patients are able to view lab/test results, encounter notes, upcoming appointments, etc.  Non-urgent messages can be sent to your provider as well.   To learn more about what you can do with MyChart, go to [email or web address].    Your next appointment:   3 month(s)  The format for your next appointment:   In Person  Provider:   Shirlee More, MD{   Other Instructions Resume Nexium  Important Information About Sugar

## 2021-10-29 NOTE — Addendum Note (Signed)
Addended by: Edwyna Shell I on: 10/29/2021 01:56 PM   Modules accepted: Orders

## 2021-11-06 DIAGNOSIS — J383 Other diseases of vocal cords: Secondary | ICD-10-CM | POA: Diagnosis not present

## 2021-11-06 DIAGNOSIS — R0602 Shortness of breath: Secondary | ICD-10-CM | POA: Diagnosis not present

## 2021-11-06 DIAGNOSIS — I2510 Atherosclerotic heart disease of native coronary artery without angina pectoris: Secondary | ICD-10-CM | POA: Diagnosis not present

## 2021-11-06 DIAGNOSIS — J984 Other disorders of lung: Secondary | ICD-10-CM | POA: Diagnosis not present

## 2021-11-11 ENCOUNTER — Other Ambulatory Visit: Payer: Self-pay

## 2021-11-11 DIAGNOSIS — I10 Essential (primary) hypertension: Secondary | ICD-10-CM

## 2021-11-11 MED ORDER — AMLODIPINE BESYLATE 5 MG PO TABS: 5.0000 mg | ORAL_TABLET | Freq: Every day | ORAL | 0 refills | Status: DC

## 2021-11-14 DIAGNOSIS — E782 Mixed hyperlipidemia: Secondary | ICD-10-CM | POA: Diagnosis not present

## 2021-11-14 DIAGNOSIS — I7121 Aneurysm of the ascending aorta, without rupture: Secondary | ICD-10-CM | POA: Diagnosis not present

## 2021-11-14 DIAGNOSIS — I25118 Atherosclerotic heart disease of native coronary artery with other forms of angina pectoris: Secondary | ICD-10-CM | POA: Diagnosis not present

## 2021-11-14 DIAGNOSIS — I10 Essential (primary) hypertension: Secondary | ICD-10-CM | POA: Diagnosis not present

## 2021-11-14 DIAGNOSIS — R0602 Shortness of breath: Secondary | ICD-10-CM | POA: Diagnosis not present

## 2021-11-15 ENCOUNTER — Telehealth: Payer: Self-pay | Admitting: Pulmonary Disease

## 2021-11-15 LAB — BASIC METABOLIC PANEL
BUN/Creatinine Ratio: 10 (ref 10–24)
BUN: 12 mg/dL (ref 8–27)
CO2: 22 mmol/L (ref 20–29)
Calcium: 9.4 mg/dL (ref 8.6–10.2)
Chloride: 103 mmol/L (ref 96–106)
Creatinine, Ser: 1.26 mg/dL (ref 0.76–1.27)
Glucose: 133 mg/dL — ABNORMAL HIGH (ref 70–99)
Potassium: 4 mmol/L (ref 3.5–5.2)
Sodium: 141 mmol/L (ref 134–144)
eGFR: 63 mL/min/{1.73_m2} (ref >59)

## 2021-11-15 LAB — PRO B NATRIURETIC PEPTIDE: NT-Pro BNP: 67 pg/mL (ref 0–376)

## 2021-11-15 MED ORDER — FLUTICASONE-SALMETEROL 230-21 MCG/ACT IN AERO: 2.0000 | INHALATION_SPRAY | Freq: Two times a day (BID) | RESPIRATORY_TRACT | 3 refills | Status: DC

## 2021-11-15 NOTE — Telephone Encounter (Signed)
Called and spoke with patient. He verbalized understanding. Refill of advair has been sent to patients pharmacy.   Nothing further needed.

## 2021-11-15 NOTE — Telephone Encounter (Signed)
Yes ok to refill if he wants to try the inhaler again.  Thanks, JD

## 2021-11-15 NOTE — Telephone Encounter (Signed)
Fax received from pt's pharmacy for a refill request of Advair HFA 230. Med was stopped at [place] 04/23/21 due to pt not noticing any benefit from the inhaler.  Called and spoke with pt to see if he was wanting to give the inhaler a try again to see if it worked this time for him and he stated he does want a refill of it. Dr. Erin Fulling, please advise if you are okay with [place] refilling pt's med.

## 2021-12-12 ENCOUNTER — Ambulatory Visit: Payer: Medicare HMO | Admitting: Cardiology

## 2021-12-12 ENCOUNTER — Telehealth: Payer: Self-pay | Admitting: Cardiology

## 2021-12-12 NOTE — Telephone Encounter (Signed)
Caller wants to know if patient can be referred for cardiac rehab as patient had stent put in in June.  Caller stated he is already having pulmonary rehab.

## 2021-12-19 NOTE — Telephone Encounter (Signed)
Cardiac rehab paperwork faxed over to [facility] at [place] at [phone].

## 2021-12-19 NOTE — Telephone Encounter (Signed)
Joe Arroyo calling back to ask if pt recent EKG can be faxed over as well.

## 2021-12-19 NOTE — Telephone Encounter (Signed)
Joe Arroyo is calling back due to not hearing back in regards to this. She is requesting a referral for cardiac rehab be faxed to [phone]. Please advise.

## 2021-12-23 DIAGNOSIS — Z955 Presence of coronary angioplasty implant and graft: Secondary | ICD-10-CM | POA: Diagnosis not present

## 2021-12-24 ENCOUNTER — Other Ambulatory Visit: Payer: Self-pay

## 2021-12-24 ENCOUNTER — Telehealth: Payer: Self-pay

## 2021-12-24 NOTE — Telephone Encounter (Signed)
Re-faxed cardiac rehabilitation paperwork to [place] office.

## 2021-12-25 DIAGNOSIS — Z955 Presence of coronary angioplasty implant and graft: Secondary | ICD-10-CM | POA: Diagnosis not present

## 2021-12-26 DIAGNOSIS — Z955 Presence of coronary angioplasty implant and graft: Secondary | ICD-10-CM | POA: Diagnosis not present

## 2021-12-30 DIAGNOSIS — Z955 Presence of coronary angioplasty implant and graft: Secondary | ICD-10-CM | POA: Diagnosis not present

## 2022-01-01 ENCOUNTER — Other Ambulatory Visit (HOSPITAL_COMMUNITY): Payer: Self-pay

## 2022-01-01 DIAGNOSIS — Z955 Presence of coronary angioplasty implant and graft: Secondary | ICD-10-CM | POA: Diagnosis not present

## 2022-01-02 DIAGNOSIS — Z955 Presence of coronary angioplasty implant and graft: Secondary | ICD-10-CM | POA: Diagnosis not present

## 2022-01-08 DIAGNOSIS — Z955 Presence of coronary angioplasty implant and graft: Secondary | ICD-10-CM | POA: Diagnosis not present

## 2022-01-09 DIAGNOSIS — Z955 Presence of coronary angioplasty implant and graft: Secondary | ICD-10-CM | POA: Diagnosis not present

## 2022-01-13 DIAGNOSIS — Z955 Presence of coronary angioplasty implant and graft: Secondary | ICD-10-CM | POA: Diagnosis not present

## 2022-01-15 DIAGNOSIS — Z955 Presence of coronary angioplasty implant and graft: Secondary | ICD-10-CM | POA: Diagnosis not present

## 2022-01-16 DIAGNOSIS — Z955 Presence of coronary angioplasty implant and graft: Secondary | ICD-10-CM | POA: Diagnosis not present

## 2022-01-20 DIAGNOSIS — Z955 Presence of coronary angioplasty implant and graft: Secondary | ICD-10-CM | POA: Diagnosis not present

## 2022-01-22 DIAGNOSIS — Z955 Presence of coronary angioplasty implant and graft: Secondary | ICD-10-CM | POA: Diagnosis not present

## 2022-01-27 DIAGNOSIS — Z955 Presence of coronary angioplasty implant and graft: Secondary | ICD-10-CM | POA: Diagnosis not present

## 2022-01-28 DIAGNOSIS — C61 Malignant neoplasm of prostate: Secondary | ICD-10-CM | POA: Diagnosis not present

## 2022-01-28 DIAGNOSIS — Z87442 Personal history of urinary calculi: Secondary | ICD-10-CM | POA: Diagnosis not present

## 2022-01-28 DIAGNOSIS — R339 Retention of urine, unspecified: Secondary | ICD-10-CM | POA: Diagnosis not present

## 2022-01-29 DIAGNOSIS — Z955 Presence of coronary angioplasty implant and graft: Secondary | ICD-10-CM | POA: Diagnosis not present

## 2022-01-30 DIAGNOSIS — Z955 Presence of coronary angioplasty implant and graft: Secondary | ICD-10-CM | POA: Diagnosis not present

## 2022-02-05 ENCOUNTER — Emergency Department (HOSPITAL_COMMUNITY): Payer: Medicare HMO

## 2022-02-05 ENCOUNTER — Encounter (HOSPITAL_COMMUNITY): Payer: Self-pay | Admitting: Emergency Medicine

## 2022-02-05 ENCOUNTER — Telehealth: Payer: Self-pay

## 2022-02-05 ENCOUNTER — Observation Stay (HOSPITAL_COMMUNITY)
Admission: EM | Admit: 2022-02-05 | Discharge: 2022-02-06 | Disposition: A | Discharge status: Home / Self Care | Payer: Medicare HMO | Attending: Internal Medicine | Admitting: Internal Medicine

## 2022-02-05 DIAGNOSIS — R079 Chest pain, unspecified: Secondary | ICD-10-CM | POA: Diagnosis not present

## 2022-02-05 DIAGNOSIS — G4733 Obstructive sleep apnea (adult) (pediatric): Secondary | ICD-10-CM | POA: Diagnosis present

## 2022-02-05 DIAGNOSIS — I7121 Aneurysm of the ascending aorta, without rupture: Secondary | ICD-10-CM | POA: Diagnosis present

## 2022-02-05 DIAGNOSIS — I10 Essential (primary) hypertension: Secondary | ICD-10-CM | POA: Diagnosis not present

## 2022-02-05 DIAGNOSIS — N400 Enlarged prostate without lower urinary tract symptoms: Secondary | ICD-10-CM | POA: Diagnosis not present

## 2022-02-05 DIAGNOSIS — I2510 Atherosclerotic heart disease of native coronary artery without angina pectoris: Secondary | ICD-10-CM | POA: Diagnosis present

## 2022-02-05 DIAGNOSIS — R0602 Shortness of breath: Secondary | ICD-10-CM | POA: Diagnosis not present

## 2022-02-05 DIAGNOSIS — Z7982 Long term (current) use of aspirin: Secondary | ICD-10-CM | POA: Insufficient documentation

## 2022-02-05 DIAGNOSIS — Z7902 Long term (current) use of antithrombotics/antiplatelets: Secondary | ICD-10-CM | POA: Diagnosis not present

## 2022-02-05 DIAGNOSIS — R55 Syncope and collapse: Secondary | ICD-10-CM | POA: Diagnosis not present

## 2022-02-05 DIAGNOSIS — K449 Diaphragmatic hernia without obstruction or gangrene: Secondary | ICD-10-CM | POA: Diagnosis not present

## 2022-02-05 DIAGNOSIS — Z79899 Other long term (current) drug therapy: Secondary | ICD-10-CM | POA: Insufficient documentation

## 2022-02-05 DIAGNOSIS — Z955 Presence of coronary angioplasty implant and graft: Secondary | ICD-10-CM | POA: Diagnosis not present

## 2022-02-05 DIAGNOSIS — I25110 Atherosclerotic heart disease of native coronary artery with unstable angina pectoris: Principal | ICD-10-CM | POA: Insufficient documentation

## 2022-02-05 DIAGNOSIS — I119 Hypertensive heart disease without heart failure: Secondary | ICD-10-CM | POA: Diagnosis present

## 2022-02-05 DIAGNOSIS — E785 Hyperlipidemia, unspecified: Secondary | ICD-10-CM | POA: Diagnosis present

## 2022-02-05 DIAGNOSIS — R0789 Other chest pain: Secondary | ICD-10-CM

## 2022-02-05 DIAGNOSIS — I200 Unstable angina: Secondary | ICD-10-CM | POA: Diagnosis not present

## 2022-02-05 DIAGNOSIS — Z66 Do not resuscitate: Secondary | ICD-10-CM | POA: Insufficient documentation

## 2022-02-05 DIAGNOSIS — R000 Tachycardia, unspecified: Secondary | ICD-10-CM | POA: Diagnosis not present

## 2022-02-05 LAB — CBC
HCT: 44.4 % (ref 39.0–52.0)
Hemoglobin: 14.9 g/dL (ref 13.0–17.0)
MCH: 28.1 pg (ref 26.0–34.0)
MCHC: 33.6 g/dL (ref 30.0–36.0)
MCV: 83.6 fL (ref 80.0–100.0)
Platelets: 263 10*3/uL (ref 150–400)
RBC: 5.31 MIL/uL (ref 4.22–5.81)
RDW: 15.6 % — ABNORMAL HIGH (ref 11.5–15.5)
WBC: 8.7 10*3/uL (ref 4.0–10.5)
nRBC: 0 % (ref 0.0–0.2)

## 2022-02-05 LAB — TROPONIN I (HIGH SENSITIVITY)
Troponin I (High Sensitivity): 11 ng/L (ref <18)
Troponin I (High Sensitivity): 13 ng/L (ref <18)

## 2022-02-05 LAB — BASIC METABOLIC PANEL
Anion gap: 14 (ref 5–15)
BUN: 11 mg/dL (ref 8–23)
CO2: 21 mmol/L — ABNORMAL LOW (ref 22–32)
Calcium: 9.7 mg/dL (ref 8.9–10.3)
Chloride: 103 mmol/L (ref 98–111)
Creatinine, Ser: 1.27 mg/dL — ABNORMAL HIGH (ref 0.61–1.24)
GFR, Estimated: >60 mL/min (ref >60)
Glucose, Bld: 91 mg/dL (ref 70–99)
Potassium: 3.8 mmol/L (ref 3.5–5.1)
Sodium: 138 mmol/L (ref 135–145)

## 2022-02-05 MED ORDER — ACETAMINOPHEN 650 MG RE SUPP: 650.0000 mg | Freq: Four times a day (QID) | RECTAL | Status: DC | PRN

## 2022-02-05 MED ORDER — NITROGLYCERIN IN D5W 200-5 MCG/ML-% IV SOLN
0.0000 ug/min | INTRAVENOUS | Status: DC
  Administered 2022-02-05: 5 ug/min via INTRAVENOUS
  Filled 2022-02-05: qty 250

## 2022-02-05 MED ORDER — SODIUM CHLORIDE 0.9% FLUSH
3.0000 mL | Freq: Two times a day (BID) | INTRAVENOUS | Status: DC
  Administered 2022-02-06: 3 mL via INTRAVENOUS

## 2022-02-05 MED ORDER — ONDANSETRON HCL 4 MG PO TABS: 4.0000 mg | ORAL_TABLET | Freq: Four times a day (QID) | ORAL | Status: DC | PRN

## 2022-02-05 MED ORDER — ASPIRIN 81 MG PO TBEC
81.0000 mg | DELAYED_RELEASE_TABLET | Freq: Every day | ORAL | Status: DC
  Administered 2022-02-06: 81 mg via ORAL
  Filled 2022-02-05: qty 1

## 2022-02-05 MED ORDER — ONDANSETRON HCL 4 MG/2ML IJ SOLN: 4.0000 mg | Freq: Four times a day (QID) | INTRAMUSCULAR | Status: DC | PRN

## 2022-02-05 MED ORDER — LABETALOL HCL 5 MG/ML IV SOLN
5.0000 mg | Freq: Once | INTRAVENOUS | Status: DC
  Filled 2022-02-05: qty 4

## 2022-02-05 MED ORDER — CLOPIDOGREL BISULFATE 75 MG PO TABS
75.0000 mg | ORAL_TABLET | Freq: Every day | ORAL | Status: DC
  Administered 2022-02-06: 75 mg via ORAL
  Filled 2022-02-05: qty 1

## 2022-02-05 MED ORDER — SENNOSIDES-DOCUSATE SODIUM 8.6-50 MG PO TABS: 1.0000 | ORAL_TABLET | Freq: Every evening | ORAL | Status: DC | PRN

## 2022-02-05 MED ORDER — ACETAMINOPHEN 325 MG PO TABS
650.0000 mg | ORAL_TABLET | Freq: Four times a day (QID) | ORAL | Status: DC | PRN
  Administered 2022-02-06: 650 mg via ORAL
  Filled 2022-02-05: qty 2

## 2022-02-05 MED ORDER — ROSUVASTATIN CALCIUM 20 MG PO TABS
40.0000 mg | ORAL_TABLET | Freq: Every day | ORAL | Status: DC
  Administered 2022-02-06: 40 mg via ORAL
  Filled 2022-02-05: qty 2

## 2022-02-05 MED ORDER — IOHEXOL 350 MG/ML SOLN
75.0000 mL | Freq: Once | INTRAVENOUS | Status: AC | PRN
  Administered 2022-02-05: 75 mL via INTRAVENOUS

## 2022-02-05 MED ORDER — EZETIMIBE 10 MG PO TABS
10.0000 mg | ORAL_TABLET | Freq: Every day | ORAL | Status: DC
  Administered 2022-02-06: 10 mg via ORAL
  Filled 2022-02-05 (×2): qty 1

## 2022-02-05 MED ORDER — ENOXAPARIN SODIUM 40 MG/0.4ML IJ SOSY: 40.0000 mg | PREFILLED_SYRINGE | INTRAMUSCULAR | Status: DC

## 2022-02-05 NOTE — Assessment & Plan Note (Signed)
Continue rosuvastatin and Zetia. 

## 2022-02-05 NOTE — ED Provider Notes (Signed)
  Physical Exam  BP (!) 143/88   Pulse 64   Temp 98.1 F (36.7 C) (Oral)   Resp 17   SpO2 99%   Physical Exam  Procedures  Procedures  ED Course / MDM    Medical Decision Making Care assumed at 7 PM.  Patient has longstanding history of CAD and had a stent placed by Dr. Ellyn Hack in June of this year.  Patient has progressive worsening shortness of breath and chest pressure.  He states that initially it was with minimal exertion and now is at rest.  He came in tachycardic and signed out pending CTA chest and cardiology consult.  9:54 PM Second troponin is normal.  Patient still has chest pressure.  I discussed case with Dr. Chancy Milroy from cardiology.  He recommend low-dose nitro drip as sublingual nitro dropped his pressure.  He will see patient as a consult.  He recommend medicine admission for unstable angina  CRITICAL CARE Performed by: Wandra Arthurs   Total critical care time: 30 minutes  Critical care time was exclusive of separately billable procedures and treating other patients.  Critical care was necessary to treat or prevent imminent or life-threatening deterioration.  Critical care was time spent personally by me on the following activities: development of treatment plan with patient and/or surrogate as well as nursing, discussions with consultants, evaluation of patient's response to treatment, examination of patient, obtaining history from patient or surrogate, ordering and performing treatments and interventions, ordering and review of laboratory studies, ordering and review of radiographic studies, pulse oximetry and re-evaluation of patient's condition.   Problems Addressed: Chest pressure: acute illness or injury Shortness of breath: acute illness or injury Tachycardia: acute illness or injury Unstable angina [facility]): acute illness or injury  Amount and/or Complexity of Data Reviewed Labs: ordered. Decision-making details documented in ED Course. Radiology: ordered and  independent interpretation performed. Decision-making details documented in ED Course. ECG/medicine tests: ordered and independent interpretation performed. Decision-making details documented in ED Course.  Risk Prescription drug management. Decision regarding hospitalization.          Drenda Freeze, MD 02/05/22 2155

## 2022-02-05 NOTE — ED Notes (Addendum)
CP rated to be 1. Sys BP 130. Pt concerned his BP may drop excessively as it has once in the past with one dose of NTG. EDP and Posey Pronto MD made aware. Goal of sys BP 110 set.

## 2022-02-05 NOTE — Assessment & Plan Note (Signed)
Holding home amlodipine, clonidine for now as he has been started on IV nitro drip.

## 2022-02-05 NOTE — Hospital Course (Signed)
Joe Arroyo is a 66 y.o. male with medical history significant for CAD s/p DES to RPL 10/07/2021, HTN, HLD, thoracic ascending aortic aneurysm, OSA not using CPAP who is admitted for evaluation of chest pain.

## 2022-02-05 NOTE — Telephone Encounter (Signed)
Staff at cardiac rehab reports that on Monday pt had a syncopal episodes with no warning prior. Pt has had continued chest pain/tightness. Advised to go to the ED for evaluation per Dr. Bettina Gavia. Staff verbalized understanding and had no additional questions.

## 2022-02-05 NOTE — Assessment & Plan Note (Addendum)
CAD s/p DES to RPL 10/07/2021 Presenting with intermittent pressure-like pain across his chest.  Troponin negative x2.  EKG without significant acute ischemic changes.  Concern is for unstable angina. -Cardiology to see -Started on IV nitroglycerin per cardiology recommendations for chest pain -Continue aspirin and Plavix -Has not been on beta-blocker due to previous HR in 50s-low 60s -Continue rosuvastatin and Zetia

## 2022-02-05 NOTE — ED Provider Notes (Signed)
[facility] EMERGENCY DEPARTMENT Provider Note   CSN: [id number] Arrival date & time: 02/05/22  1131     History  Chief Complaint  Patient presents with   Chest Pain   Loss of Consciousness    Joe Arroyo is a 66 y.o. male.  Patient presents to the hospital complaining of chest pressure and a syncopal episode.  Patient states he has had intermittent chest pressure over the past year.  He states that over the past 1 to 2 weeks he has had a slight increase in his chest pressure.  He states his baseline chest pressure is 3 out of 10 in severity, currently rated at 4 out of 10.  He states that on Monday of this week he was at a continuing education class and available and upon leaving the class as he had a sudden lightheaded feeling and fell, hitting his face on the ground.  The patient has a baseline shortness of breath and has been evaluated by pulmonology at 2 different groups who have found no cause.  He states this is not worse than usual in his opinion.  He has been going to cardiac rehab after a stent was placed this June.  Cardiac rehab recommended come to the emergency department for evaluation due to the syncopal episode.  The patient denies abdominal pain, nausea, vomiting, headache, urinary symptoms.  HPI     Home Medications Prior to Admission medications   Medication Sig Start Date End Date Taking? Authorizing Provider  amLODipine (NORVASC) 5 MG tablet Take 1 tablet (5 mg total) by mouth daily. 11/11/21   [name], MD  aspirin EC 81 MG tablet Take 1 tablet (81 mg total) by mouth daily. Swallow whole. 08/30/21   Richardo Priest, MD  cloNIDine (CATAPRES) 0.3 MG tablet Take 1 tablet (0.3 mg total) by mouth daily. 09/11/21   Cox, Elnita Maxwell, MD  clopidogrel (PLAVIX) 75 MG tablet Take 1 tablet (75 mg total) by mouth daily. 10/07/21 04/05/22  Cheryln Manly, NP  ezetimibe (ZETIA) 10 MG tablet Take 1 tablet (10 mg total) by mouth daily. 09/12/21   Cox, Elnita Maxwell, MD   finasteride (PROSCAR) 5 MG tablet Take 5 mg by mouth daily. 05/06/18   [provider]  fluticasone-salmeterol (ADVAIR HFA) 230-21 MCG/ACT inhaler Inhale 2 puffs into the lungs 2 (two) times daily. 11/15/21   [name], MD  furosemide (LASIX) 40 MG tablet Take 1 tablet (40 mg total) by mouth 2 (two) times daily. 10/29/21   Richardo Priest, MD  meloxicam (MOBIC) 15 MG tablet Take 1 tablet (15 mg total) by mouth daily as needed. 10/23/21   Cox, Elnita Maxwell, MD  Multiple Vitamins-Minerals (CENTRUM SILVER ULTRA MENS PO) Take 1 tablet by mouth daily.    [provider]  Omega-3 Fatty Acids (FISH OIL PO) Take 1 tablet by mouth 2 (two) times daily.    [provider]  rosuvastatin (CRESTOR) 40 MG tablet Take 1 tablet (40 mg total) by mouth daily. 10/23/21   CoxElnita Maxwell, MD      Allergies    Bystolic [nebivolol hcl], Carvedilol, Pravastatin, Hctz [hydrochlorothiazide], Hydralazine, and Penicillins    Review of Systems   Review of Systems  Respiratory:  Positive for shortness of breath.   Cardiovascular:  Positive for chest pain.  Gastrointestinal:  Negative for abdominal pain, nausea and vomiting.  Genitourinary:  Negative for dysuria.    Physical Exam Updated Vital Signs BP 133/85   Pulse 75   Temp  98.1 F (36.7 C) (Oral)   Resp 14   SpO2 100%  Physical Exam Vitals and nursing note reviewed.  Constitutional:      General: He is not in acute distress.    Appearance: He is well-developed.  HENT:     Head: Normocephalic and atraumatic.  Eyes:     Conjunctiva/sclera: Conjunctivae normal.  Cardiovascular:     Rate and Rhythm: Regular rhythm. Tachycardia present.     Heart sounds: No murmur heard. Pulmonary:     Effort: Pulmonary effort is normal. No respiratory distress.     Breath sounds: Normal breath sounds.  Chest:     Chest wall: No tenderness.  Abdominal:     Palpations: Abdomen is soft.     Tenderness: There is no abdominal tenderness.   Musculoskeletal:        General: No swelling.     Cervical back: Neck supple.     Right lower leg: No edema.     Left lower leg: No edema.  Skin:    General: Skin is warm and dry.     Capillary Refill: Capillary refill takes less than 2 seconds.  Neurological:     Mental Status: He is alert.  Psychiatric:        Mood and Affect: Mood normal.     ED Results / Procedures / Treatments   Labs (all labs ordered are listed, but only abnormal results are displayed) Labs Reviewed  BASIC METABOLIC PANEL - Abnormal; Notable for the following components:      Result Value   CO2 21 (*)    Creatinine, Ser 1.27 (*)    All other components within normal limits  CBC - Abnormal; Notable for the following components:   RDW 15.6 (*)    All other components within normal limits  TROPONIN I (HIGH SENSITIVITY)  TROPONIN I (HIGH SENSITIVITY)    EKG EKG Interpretation  Date/Time:  Wednesday February 05 2022 12:35:09 EDT Ventricular Rate:  111 PR Interval:  166 QRS Duration: 80 QT Interval:  322 QTC Calculation: 437 R Axis:   83 Text Interpretation: Sinus tachycardia Cannot rule out Anterior infarct , age undetermined Abnormal ECG When compared with ECG of 07-Oct-2021 10:54, SINCE LAST TRACING HEART RATE HAS INCREASED of 07 October 2021 Confirmed by Pattricia Boss [phone]) on 02/05/2022 3:48:28 PM  Radiology DG Chest 2 View  Result Date: 02/05/2022 CLINICAL DATA:  Chest pain and syncope EXAM: CHEST - 2 VIEW COMPARISON:  01/25/2021 FINDINGS: Heart size is normal. Chronic aortic tortuosity. Chronic scarring at the lung bases. No consolidation or lobar collapse. No effusion. No significant bone finding. IMPRESSION: No active disease. Chronic scarring at the lung bases. Aortic tortuosity. Electronically Signed   By: Nelson Chimes M.D.   On: 02/05/2022 13:38    Procedures Procedures    Medications Ordered in ED Medications  labetalol (NORMODYNE) injection 5 mg (has no administration in time  range)    ED Course/ Medical Decision Making/ A&P                           Medical Decision Making Amount and/or Complexity of Data Reviewed Radiology: ordered.   This patient presents to the ED for concern of chest pain, syncope, shortness of breath, this involves an extensive number of treatment options, and is a complaint that carries with it a high risk of complications and morbidity.  The differential diagnosis includes ACS, PE, dysrhythmia, vasovagal response, dehydration, and  others   Co morbidities that complicate the patient evaluation  History of coronary artery disease, hypertension, shortness of breath   Additional history obtained:  Additional history obtained from friend at bedside External records from outside source obtained and reviewed including note from cardiac rehab documenting single episode and recommending emergency department evaluation   Lab Tests:  I Ordered, and personally interpreted labs.  The pertinent results include: Unremarkable CBC, unremarkable BMP (creatinine 1.27 at patient's baseline), initial troponin 11   Imaging Studies ordered:  I ordered imaging studies including chest x-ray, CT angio chest PE study, CT head without contrast I independently visualized and interpreted imaging which showed no acute process on chest x-ray.  Chronic scarring at lung bases.  Aortic tortuosity I agree with the radiologist interpretation   Cardiac Monitoring: / EKG:  The patient was maintained on a cardiac monitor.  I personally viewed and interpreted the cardiac monitored which showed an underlying rhythm of: Sinus tachycardia at 111 bpm   Consultations Obtained:  I requested consultation with the cardiologist, Dr. Claiborne Billings,  And discussed lab and imaging findings as well as pertinent plan - they recommend: follow-up on repeat troponin and CT PE study.  Recommend dose of beta-blocker.  Reconsult if needed based on results   Problem List / ED Course /  Critical interventions / Medication management   I ordered medication including 5 mg labetalol for tachycardia I have reviewed the patients home medicines and have made adjustments as needed   Test / Admission - Considered:  Patient care being transferred to Dr. Darl Householder at shift handoff.  Patient disposition pending results of CT chest PE study and CT head.  Consult may need to be repeated with cardiology for further recommendations depending on results of PE study and second troponin.        Final Clinical Impression(s) / ED Diagnoses Final diagnoses:  Chest pressure  Tachycardia  Shortness of breath    Rx / DC Orders ED Discharge Orders     None         Ronny Bacon 02/05/22 Kathaleen Maser    Pattricia Boss, MD 02/06/22 [phone]

## 2022-02-05 NOTE — ED Notes (Signed)
No answer for pt for bed assignment.

## 2022-02-05 NOTE — ED Provider Triage Note (Signed)
Emergency Medicine Provider Triage Evaluation Note  Joe Arroyo , a 66 y.o. male  was evaluated in triage.  Pt complains of 2 days of chest pressure with 1 episode of syncope on Monday.  States he feels like something is "sitting on his chest", comes and goes usually worse with exertion or bending over.  Denies fevers, chills, N/V/D, abdominal pain, back pain, or leg weakness/numbness/tingling.  Most recent cardiac stent placed in June/July.  Has had 2 echoes this year.  Extensive cardiac history.  Hx of thoracic ascending aortic aneurysm.  Review of Systems  Positive:  Negative: See above  Physical Exam  BP (!) 164/106 (BP Location: Right Arm)   Pulse (!) 114   Temp 98 F (36.7 C)   Resp 17   SpO2 96%  Gen:   Awake, no distress   Resp:  Normal effort, CTAB MSK:   Moves extremities without difficulty  Other:  Chest non-TTP, HR tachycardic, rhythm appears normal to palpation.  Medical Decision Making  Medically screening exam initiated at 1:08 PM.  Appropriate orders placed.  Beren Yniguez was informed that the remainder of the evaluation will be completed by another provider, this initial triage assessment does not replace that evaluation, and the importance of remaining in the ED until their evaluation is complete.  Concern for ACS.  Discussed patient with triage nurse, patient to come back to next available room when possible.   Prince Rome, [place] 35/67/01 2338

## 2022-02-05 NOTE — H&P (Signed)
History and Physical    Joe Arroyo ZOX:[id number] DOB: April 04, 1956 DOA: 02/05/2022  PCP: [name], MD  Patient coming from: Home  I have personally briefly reviewed patient's old medical records in [place]  Chief Complaint: Chest pain  HPI: Joe Arroyo is a 65 y.o. male with medical history significant for CAD s/p DES to RPL 10/07/2021, HTN, HLD, thoracic ascending aortic aneurysm, OSA not using CPAP who presented to the ED for evaluation of chest pain.  Patient states on 10/23 he was taking a class which time he was sitting for about 4 hours with intermittent 15-minute breaks.  After his class he was walking back to his car when he suddenly felt lightheaded and as if his body was "floating."  He felt himself falling to the ground before he lost consciousness.  He did hit his face when he fell resulting in abrasions to his mid lower forehead, tip of the nose, and lips/chin.  He says since then he has been having pressure-like discomfort across his chest which has been occurring with minimal exertion as well as at rest.  Chest pressure has been on and off but sometimes last for prolonged period of time.  He also has been having a "quivering" sensation throughout his body at times.    He reports chronic shortness of breath which has been ongoing for 2 years and not changed from his baseline.  He has been seen by pulmonology and found to have mild restrictive defect but no clear etiology of his dyspnea.  He has been taking Advair and Lasix with no change in the symptoms.  He has not had any nausea, vomiting, abdominal pain, dysuria, diarrhea, focal weakness.  ED Course  Labs/Imaging on admission: I have personally reviewed following labs and imaging studies.  Initial vitals showed BP 164/106, pulse 114, RR 17, temp 98.0 F, SPO2 96% on room air.  Labs show troponin 11 > 13.  WBC 8.7, hemoglobin 14.9, platelets 263,000.  Sodium 138, potassium 3.8, bicarb 21, BUN 11, creatinine  1.27.  2 view chest x-ray negative for focal consolidation, edema, effusion.  Chronic scarring at the lung bases noted.  CT head without contrast negative for acute intracranial hemorrhage or calvarial fracture.  Minimal periventricular white matter hypodensities, likely chronic ischemic white matter changes noted.  CTA chest negative for evidence of PE.  4.6 cm ascending thoracic aortic aneurysm noted, previously measuring 4.4 cm.  No acute intrathoracic process seen.  EDP discussed with on-call cardiology who recommended starting on low-dose nitroglycerin infusion for unstable angina.  The hospitalist service was consulted to admit for further evaluation and management.  Review of Systems: All systems reviewed and are negative except as documented in history of present illness above.   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   Shortness of breath 09/05/2019   Sinus bradycardia 05/14/2018   Syncope 09/05/2019   Thoracic ascending aortic aneurysm (HCC)    ascending TAA 4.6 x 4.4 cm 10/03/20 CTA chest  Past Surgical History:  Procedure Laterality Date   COLONOSCOPY     CORONARY STENT INTERVENTION N/A 10/07/2021   Procedure: CORONARY STENT INTERVENTION;  Surgeon: Leonie Man, MD;  Location: [facility];  Service: Cardiovascular;  Laterality: N/A;   ESOPHAGOGASTRODUODENOSCOPY N/A 12/05/2020   Procedure: ESOPHAGOGASTRODUODENOSCOPY (EGD);  Surgeon: Lajuana Matte, MD;  Location:  [facility] OR;  Service: Thoracic;  Laterality: N/A;   LAMINECTOMY     MOLE REMOVAL     PROSTATE BIOPSY     RIGHT/LEFT HEART CATH AND CORONARY ANGIOGRAPHY N/A 10/07/2021   Procedure: RIGHT/LEFT HEART CATH AND CORONARY ANGIOGRAPHY;  Surgeon: Leonie Man, MD;  Location: [facility];  Service: Cardiovascular;  Laterality: N/A;   TONSILLECTOMY AND ADENOIDECTOMY     UPPER GASTROINTESTINAL ENDOSCOPY     XI ROBOTIC ASSISTED PARAESOPHAGEAL HERNIA REPAIR N/A 12/05/2020   Procedure: XI ROBOTIC ASSISTED PARAESOPHAGEAL HERNIA REPAIR AND FUNDOPLICATION;  Surgeon: Lajuana Matte, MD;  Location: [facility];  Service: Thoracic;  Laterality: N/A;    Social History:  reports that he has never smoked. He has never used smokeless tobacco. He reports current alcohol use. He reports that he does not use drugs.  Allergies  Allergen Reactions   Bystolic [Nebivolol Hcl] Other (See Comments)    bradycardia   Carvedilol Other (See Comments)    bradycardia   Pravastatin Other (See Comments)    "made my joints hurt"   Hctz [Hydrochlorothiazide] Rash   Hydralazine Rash   Penicillins Rash    Family History  Problem Relation Age of Onset   Hyperlipidemia Mother    Aortic stenosis Mother    Valvular heart disease Mother    Colon cancer Mother    Diabetes Mellitus II Mother    Cancer - Colon Mother    Cancer Mother    Stroke Mother    Cerebrovascular Accident Mother    Diabetes Mother    Hypertension Mother    Hyperlipidemia Father    Diabetes Mellitus II Father    Diabetes Father    Alzheimer's disease Father    Esophageal cancer Paternal Uncle    CAD Maternal Grandmother    Congestive Heart Failure Maternal Grandmother    Heart attack Maternal Grandmother    Heart failure Maternal Grandmother    Coronary artery disease Maternal Grandmother    Hyperlipidemia Maternal Grandmother      Prior to Admission medications   Medication Sig Start Date End Date Taking? Authorizing Provider   amLODipine (NORVASC) 5 MG tablet Take 1 tablet (5 mg total) by mouth daily. 11/11/21   [name], MD  aspirin EC 81 MG tablet Take 1 tablet (81 mg total) by mouth daily. Swallow whole. 08/30/21   Richardo Priest, MD  cloNIDine (CATAPRES) 0.3 MG tablet Take 1 tablet (0.3 mg total) by mouth daily. 09/11/21   Cox, Elnita Maxwell, MD  clopidogrel (PLAVIX) 75 MG tablet Take 1 tablet (75 mg total) by mouth daily. 10/07/21 04/05/22  Cheryln Manly, NP  ezetimibe (ZETIA) 10 MG tablet Take 1 tablet (10 mg total) by mouth daily. 09/12/21   Cox, Elnita Maxwell, MD  finasteride (PROSCAR) 5 MG tablet Take 5 mg by mouth daily. 05/06/18   [provider]  fluticasone-salmeterol (ADVAIR HFA) 230-21 MCG/ACT inhaler Inhale 2 puffs into the lungs 2 (two) times daily. 11/15/21   [name], MD  furosemide (LASIX) 40 MG tablet Take 1 tablet (40 mg total) by mouth 2 (two) times daily. 10/29/21   Shirlee More  J, MD  meloxicam (MOBIC) 15 MG tablet Take 1 tablet (15 mg total) by mouth daily as needed. 10/23/21   Cox, Elnita Maxwell, MD  Multiple Vitamins-Minerals (CENTRUM SILVER ULTRA MENS PO) Take 1 tablet by mouth daily.    [provider]  Omega-3 Fatty Acids (FISH OIL PO) Take 1 tablet by mouth 2 (two) times daily.    [provider]  rosuvastatin (CRESTOR) 40 MG tablet Take 1 tablet (40 mg total) by mouth daily. 10/23/21   [name], MD    Physical Exam: Vitals:   02/05/22 2145 02/05/22 2200 02/05/22 2209 02/05/22 2215  BP: 135/89 (!) 168/86  (!) 172/96  Pulse: 69 64  73  Resp: (!) 22     Temp:   98.3 F (36.8 C)   TempSrc:   Oral   SpO2: 98% 99%  99%   Constitutional: Resting in bed, NAD, calm, comfortable Eyes: PERRL, lids and conjunctivae normal ENMT: Mucous membranes are moist. Posterior pharynx clear of any exudate or lesions.Normal dentition.  Neck: normal, supple, no masses. Respiratory: clear to auscultation bilaterally, no wheezing, no crackles. Normal respiratory effort. No  accessory muscle use.  Cardiovascular: Regular rate and rhythm, no murmurs / rubs / gallops. No extremity edema. 2+ pedal pulses. Abdomen: no tenderness, no masses palpated. Musculoskeletal: no clubbing / cyanosis. No joint deformity upper and lower extremities. Good ROM, no contractures. Normal muscle tone.  Skin: Abrasions to the glabella, tip of the nose, chin. Neurologic: Sensation intact. Strength 5/5 in all 4.  Psychiatric: Normal judgment and insight. Alert and oriented x 3. Normal mood.   EKG: Personally reviewed. Sinus tachycardia, rate 111, no acute ischemic changes.  Rate is faster when compared to prior.  Assessment/Plan Principal Problem:   Unstable angina (HCC) Active Problems:   Coronary artery disease   Syncope   Essential hypertension, benign   Hyperlipidemia   OSA (obstructive sleep apnea)   Thoracic ascending aortic aneurysm (HCC)   Joe Arroyo is a 66 y.o. male with medical history significant for CAD s/p DES to RPL 10/07/2021, HTN, HLD, thoracic ascending aortic aneurysm, OSA not using CPAP who is admitted for evaluation of chest pain.  Assessment and Plan: * Unstable angina (HCC) CAD s/p DES to RPL 10/07/2021 Presenting with intermittent pressure-like pain across his chest.  Troponin negative x2.  EKG without significant acute ischemic changes.  Concern is for unstable angina. -Cardiology to see -Started on IV nitroglycerin per cardiology recommendations for chest pain -Continue aspirin and Plavix -Has not been on beta-blocker due to previous HR in 50s-low 60s -Continue rosuvastatin and Zetia  Syncope Reported syncopal episode occurring 2 days prior to admit.  Suspect vasovagal or orthostatic syncope however cardiac work-up also in process as above. -Hold home amlodipine, clonidine for now -Started on IV nitro drip so will hold off on orthostatic vitals at this time -Keep on telemetry  Thoracic ascending aortic aneurysm (HCC) CTA shows ascending thoracic  aortic aneurysm measuring 4.6 cm, previous 4.4 cm.  Semiannual imaging follow-up by CTA or MRI recommended.  OSA (obstructive sleep apnea) Not using CPAP.  Use supplemental O2 via [place] as needed while in hospital.  Hyperlipidemia Continue rosuvastatin and Zetia.  Essential hypertension, benign Holding home amlodipine, clonidine for now as he has been started on IV nitro drip.  DVT prophylaxis: enoxaparin (LOVENOX) injection 40 mg Start: 02/07/22 0800 Code Status: DNR, confirmed with patient on admission Family Communication: Father at bedside Disposition Plan: From home and likely discharge to home pending clinical  progress Consults called: Cardiology Severity of Illness: The appropriate patient status for this patient is OBSERVATION. Observation status is judged to be reasonable and necessary in order to provide the required intensity of service to ensure the patient's safety. The patient's presenting symptoms, physical exam findings, and initial radiographic and laboratory data in the context of their medical condition is felt to place them at decreased risk for further clinical deterioration. Furthermore, it is anticipated that the patient will be medically stable for discharge from the hospital within 2 midnights of admission.   Zada Finders MD [facility]  If 7PM-7AM, please contact night-coverage [email or web address]  02/05/2022, 10:35 PM

## 2022-02-05 NOTE — Assessment & Plan Note (Signed)
CTA shows ascending thoracic aortic aneurysm measuring 4.6 cm, previous 4.4 cm.  Semiannual imaging follow-up by CTA or MRI recommended.

## 2022-02-05 NOTE — Assessment & Plan Note (Signed)
Not using CPAP.  Use supplemental O2 via [place] as needed while in hospital.

## 2022-02-05 NOTE — Consult Note (Signed)
Cardiology Consultation   Patient ID: Joe Arroyo MRN: [id number]; DOB: Nov 20, 1955  Admit date: 02/05/2022 Date of Consult: 02/05/2022  PCP:  [name], MD   [place] Providers Cardiologist:  Shirlee More, MD        Patient Profile:   Joe Arroyo is a 66 y.o. male with a hx of  CAD s/p DES to RPL 10/07/2021, HTN, HLD, thoracic ascending aortic aneurysm  who is being seen 02/05/2022 for the evaluation of chest pain at the request of Dr. Posey Pronto  History of Present Illness:   Joe Arroyo is a 66 y.o. male with a hx of  CAD s/p DES to RPL 10/07/2021, HTN, HLD, thoracic ascending aortic aneurysm  who is being seen 02/05/2022 for the evaluation of chest pain at the request of Dr. Posey Pronto. Patient presents to the ED today with complains of chest pain. He states he has been having CP since 2 years, and this feels similar. Today intensity was slightly higher hence came in to the ED. He also has SOB since 1-2 years. Currently the pain is 1/10. He also had an episode of ?syncope few days ago. Pressure-like discomfort across his chest which has been occurring with minimal exertion as well as at rest. States the pain is there even when walking.  He has been seen by pulmonology and found to have mild restrictive defect but no clear etiology of his dyspnea. In the ED, his troponin were flat, and EKG showed no ischemic changes. SBP was 150s. CTA chest negative for evidence of PE.  4.6 cm ascending thoracic aortic aneurysm noted, previously measuring 4.4 cm.  No acute intrathoracic process seen. Cardiology was consulted for possible unstable angina. He had a recent LHC done on 6/26 (see below report). He states he is not taking aspirin.    Past Medical History:  Diagnosis Date   Atypical chest pain 01/07/2021   Chronic idiopathic constipation 01/07/2021   Cough 09/05/2019   Dyspnea    Essential hypertension, benign 05/14/2018   GERD (gastroesophageal reflux disease) 09/28/2020   Hiatal  hernia 09/28/2020   History of hiatal hernia    History of kidney stones    Hyperlipidemia 05/14/2018   Malaise 09/05/2019   Non-seasonal allergic rhinitis due to pollen 08/16/2019   OSA (obstructive sleep apnea) 09/28/2020   Other headache syndrome 08/16/2019   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   Shortness of breath 09/05/2019   Sinus bradycardia 05/14/2018   Syncope 09/05/2019   Thoracic ascending aortic aneurysm (HCC)    ascending TAA 4.6 x 4.4 cm 10/03/20 CTA chest    Past Surgical History:  Procedure Laterality Date   COLONOSCOPY     CORONARY STENT INTERVENTION N/A 10/07/2021   Procedure: CORONARY STENT INTERVENTION;  Surgeon: Leonie Man, MD;  Location: [facility];  Service: Cardiovascular;  Laterality: N/A;   ESOPHAGOGASTRODUODENOSCOPY N/A 12/05/2020   Procedure: ESOPHAGOGASTRODUODENOSCOPY (EGD);  Surgeon: Lajuana Matte, MD;  Location[facility];  Service: Thoracic;  Laterality: N/A;   LAMINECTOMY     MOLE REMOVAL     PROSTATE BIOPSY     RIGHT/LEFT HEART CATH AND CORONARY ANGIOGRAPHY N/A 10/07/2021   Procedure: RIGHT/LEFT HEART CATH AND CORONARY ANGIOGRAPHY;  Surgeon: Leonie Man, MD;  Location: [facility];  Service: Cardiovascular;  Laterality: N/A;   TONSILLECTOMY AND ADENOIDECTOMY     UPPER GASTROINTESTINAL ENDOSCOPY     XI ROBOTIC ASSISTED PARAESOPHAGEAL HERNIA REPAIR N/A 12/05/2020   Procedure: XI ROBOTIC ASSISTED PARAESOPHAGEAL HERNIA REPAIR  AND FUNDOPLICATION;  Surgeon: Lajuana Matte, MD;  Location: [facility];  Service: Thoracic;  Laterality: N/A;       Inpatient Medications: Scheduled Meds:  [START ON 02/06/2022] aspirin EC  81 mg Oral Daily   [START ON 02/06/2022] clopidogrel  75 mg Oral Daily   [START ON 02/07/2022] enoxaparin (LOVENOX) injection  40 mg Subcutaneous Q24H   [START ON 02/06/2022] ezetimibe  10 mg Oral Daily   [START ON 02/06/2022] rosuvastatin  40 mg Oral Daily   sodium chloride flush  3 mL Intravenous Q12H   Continuous  Infusions:  nitroGLYCERIN 5 mcg/min (02/05/22 2220)   PRN Meds: acetaminophen **OR** acetaminophen, ondansetron **OR** ondansetron (ZOFRAN) IV, senna-docusate  Allergies:    Allergies  Allergen Reactions   Bystolic [Nebivolol Hcl] Other (See Comments)    bradycardia   Carvedilol Other (See Comments)    bradycardia   Pravastatin Other (See Comments)    "made my joints hurt"   Hctz [Hydrochlorothiazide] Rash   Hydralazine Rash   Penicillins Rash    Social History:   Social History   Socioeconomic History   Marital status: Single    Spouse name: Not on file   Number of children: Not on file   Years of education: Not on file   Highest education level: Not on file  Occupational History   Not on file  Tobacco Use   Smoking status: Never   Smokeless tobacco: Never  Vaping Use   Vaping Use: Never used  Substance and Sexual Activity   Alcohol use: Yes    Comment: occasional   Drug use: Never   Sexual activity: Not on file  Other Topics Concern   Not on file  Social History Narrative   Not on file   Social Determinants of Health   Financial Resource Strain: Low Risk  (09/12/2021)   Overall Financial Resource Strain (CARDIA)    Difficulty of Paying Living Expenses: Not hard at all  Food Insecurity: No Food Insecurity (09/12/2021)   Hunger Vital Sign    Worried About Running Out of Food in the Last Year: Never true    Ran Out of Food in the Last Year: Never true  Transportation Needs: No Transportation Needs (09/12/2021)   PRAPARE - Hydrologist (Medical): No    Lack of Transportation (Non-Medical): No  Physical Activity: Unknown (09/12/2021)   Exercise Vital Sign    Days of Exercise per Week: 0 days    Minutes of Exercise per Session: Not on file  Stress: No Stress Concern Present (09/12/2021)   [place]    Feeling of Stress : Only a little  Social Connections: Socially  Isolated (09/12/2021)   Social Connection and Isolation Panel [NHANES]    Frequency of Communication with Friends and Family: More than three times a week    Frequency of Social Gatherings with Friends and Family: Once a week    Attends Religious Services: Never    Marine scientist or Organizations: No    Attends Archivist Meetings: Never    Marital Status: Never married  Intimate Partner Violence: Not At Risk (09/12/2021)   Humiliation, Afraid, Rape, and Kick questionnaire    Fear of Current or Ex-Partner: No    Emotionally Abused: No    Physically Abused: No    Sexually Abused: No    Family History:   \ Family History  Problem Relation Age of Onset  Hyperlipidemia Mother    Aortic stenosis Mother    Valvular heart disease Mother    Colon cancer Mother    Diabetes Mellitus II Mother    Cancer - Colon Mother    Cancer Mother    Stroke Mother    Cerebrovascular Accident Mother    Diabetes Mother    Hypertension Mother    Hyperlipidemia Father    Diabetes Mellitus II Father    Diabetes Father    Alzheimer's disease Father    Esophageal cancer Paternal Uncle    CAD Maternal Grandmother    Congestive Heart Failure Maternal Grandmother    Heart attack Maternal Grandmother    Heart failure Maternal Grandmother    Coronary artery disease Maternal Grandmother    Hyperlipidemia Maternal Grandmother      ROS:  Please see the history of present illness.  All other ROS reviewed and negative.     Physical Exam/Data:   Vitals:   02/05/22 2209 02/05/22 2215 02/05/22 2225 02/05/22 2230  BP:  (!) 172/96 (!) 160/95 (!) 172/117  Pulse:  73 77 67  Resp:      Temp: 98.3 F (36.8 C)     TempSrc: Oral     SpO2:  99% 99% 97%   No intake or output data in the 24 hours ending 02/05/22 2315    10/29/2021    1:03 PM 10/07/2021    8:08 AM 09/13/2021    9:54 AM  Last 3 Weights  Weight (lbs) 204 lb 195 lb 205 lb  Weight (kg) 92.534 kg 88.451 kg 92.987 kg     There is  no height or weight on file to calculate BMI.  General:  Well nourished, well developed, in no acute distress HEENT: normal Neck: no JVD Vascular: No carotid bruits; Distal pulses 2+ bilaterally Cardiac:  normal S1, S2; RRR; no murmur  Lungs:  clear to auscultation bilaterally, no wheezing, rhonchi or rales  Abd: soft, nontender, no hepatomegaly  Ext: no edema Musculoskeletal:  No deformities, BUE and BLE strength normal and equal Skin: warm and dry  Neuro:  CNs 2-12 intact, no focal abnormalities noted Psych:  Normal affect   EKG:  The EKG was personally reviewed and demonstrates no ST elevation   Relevant CV Studies:  Cardiac Catheterization 10/07/2021:     CULPRIT LESION SEGMENT: 2nd RPL-1 lesion is 90% stenosed.  2nd RPL-2 lesion is 80% stenosed.  (FFR ct positive)   2nd RPL-2 lesion is 80% stenosed.   A drug-eluting stent was successfully placed covering both lesions using a SYNERGY XD 2.25X24.  Deployed to 2.3 mm postdilated proximally to 2.5 mm (tapered from 2.5 to 2.3 mm distally.)   Post intervention, there is a 0% residual stenosis across the entire segment..   Otherwise angiographically normal coronaries; Right Dominant System   There is no aortic valve stenosis.   Relatively normal RHC numbers  SUMMARY Severe single-vessel disease as indicated by Coronary CTA with RPL 2 having tandem 90% and 80% stenoses (CT FFR positive)-this vessel is at least the same size as the LAD Successful DES PCI of RPL 2 covering both lesions with a Synergy DES 2.25 mm x 20 mm postdilated in tapered fashion from 2.5 to 2.3 mm.) Otherwise minimal CAD with relatively small caliber left coronary system and large dominant RCA. Normal LVEF by Echo with mildly elevated LVEDP 18 mmHg Right Heart Cath Numbers: Mean RAP 5 mmHg, RVP-880 P 22/1-6 mmHg; PAP-main 23/8-15 mmHg, PCWP 11 mmHg; LVP-EDP 110/5-18 mmHg,  AoP-MAP 109/58-82 mmHg; AO Sat 99%, PA Sat 70%--Fick CO-CI 4.91-2.33 (mildly reduced)    RECOMMENDATIONS Same-day discharge following RPL 2 PCI with DES Monitor closely for blood pressure-in future would recommend avoiding nitrates-had significant drop in blood pressure response IC NTG Not on beta-blocker because of bradycardia-heart rate in the 50s to low 60s.  Continue amlodipine. Increase rosuvastatin to 40 mg daily DAPT x6 months (ASA 81 mg/Plavix 75 mg); for next 6 months we will continue Plavix monotherapy without aspirin to complete 1 year.  Laboratory Data:  High Sensitivity Troponin:   Recent Labs  Lab 02/05/22 1311 02/05/22 1900  TROPONINIHS 11 13     Chemistry Recent Labs  Lab 02/05/22 1311  NA 138  K 3.8  CL 103  CO2 21*  GLUCOSE 91  BUN 11  CREATININE 1.27*  CALCIUM 9.7  GFRNONAA >60  ANIONGAP 14    No results for input(s): "PROT", "ALBUMIN", "AST", "ALT", "ALKPHOS", "BILITOT" in the last 168 hours. Lipids No results for input(s): "CHOL", "TRIG", "HDL", "LABVLDL", "LDLCALC", "CHOLHDL" in the last 168 hours.  Hematology Recent Labs  Lab 02/05/22 1311  WBC 8.7  RBC 5.31  HGB 14.9  HCT 44.4  MCV 83.6  MCH 28.1  MCHC 33.6  RDW 15.6*  PLT 263   Thyroid No results for input(s): "TSH", "FREET4" in the last 168 hours.  BNPNo results for input(s): "BNP", "PROBNP" in the last 168 hours.  DDimer No results for input(s): "DDIMER" in the last 168 hours.   Radiology/Studies:  CT Head Wo Contrast  Result Date: 02/05/2022 CLINICAL DATA:  Minor head trauma.  Loss of consciousness. EXAM: CT HEAD WITHOUT CONTRAST TECHNIQUE: Contiguous axial images were obtained from the base of the skull through the vertex without intravenous contrast. RADIATION DOSE REDUCTION: This exam was performed according to the departmental dose-optimization program which includes automated exposure control, adjustment of the mA and/or kV according to patient size and/or use of iterative reconstruction technique. COMPARISON:  MRI brain 02/14/2020 FINDINGS: Brain: The ventricles  are normal in size and configuration. The basilar cisterns are patent. No mass, mass effect, or midline shift. No acute intracranial hemorrhage is seen. No abnormal extra-axial fluid collection. Minimal periventricular white matter hypodensities, likely chronic ischemic white matter changes and similar to the FLAIR signal on prior MRI. Preservation of the normal cortical gray-white interface without CT evidence of an acute major vascular territorial cortical based infarction. Vascular: No hyperdense vessel or unexpected calcification. Skull: Normal. Negative for fracture or focal lesion. Sinuses/Orbits: The visualized orbits are unremarkable. The visualized paranasal sinuses and mastoid air cells are clear. Other: None. IMPRESSION: 1. No acute intracranial hemorrhage or calvarial fracture. 2. Minimal periventricular white matter hypodensities, likely chronic ischemic white matter changes. Electronically Signed   By: Yvonne Kendall M.D.   On: 02/05/2022 19:42   CT Angio Chest Pulmonary Embolism (PE) W or WO Contrast  Result Date: 02/05/2022 CLINICAL DATA:  Chest pain and shortness of breath, loss of consciousness EXAM: CT ANGIOGRAPHY CHEST WITH CONTRAST TECHNIQUE: Multidetector CT imaging of the chest was performed using the standard protocol during bolus administration of intravenous contrast. Multiplanar CT image reconstructions and MIPs were obtained to evaluate the vascular anatomy. RADIATION DOSE REDUCTION: This exam was performed according to the departmental dose-optimization program which includes automated exposure control, adjustment of the mA and/or kV according to patient size and/or use of iterative reconstruction technique. CONTRAST:  18m OMNIPAQUE IOHEXOL 350 MG/ML SOLN COMPARISON:  08/29/2021 FINDINGS: Cardiovascular: This is a technically adequate  evaluation of the pulmonary vasculature. No filling defects or pulmonary emboli. The heart is unremarkable without pericardial effusion. 4.6 cm  ascending thoracic aortic aneurysm. No evidence of dissection. Mediastinum/Nodes: No enlarged mediastinal, hilar, or axillary lymph nodes. Thyroid gland, trachea, and esophagus demonstrate no significant findings. Stable hiatal hernia. Lungs/Pleura: No acute airspace disease, effusion, or pneumothorax. Subsegmental atelectasis at the lung bases. Central airways are patent. The sub 5 mm right middle and right lower lobe pulmonary nodules reported on prior study are not well visualized on this exam. There are no new pulmonary nodules or masses. Upper Abdomen: No acute abnormality. Musculoskeletal: No acute or destructive bony lesions. Reconstructed images demonstrate no additional findings. Review of the MIP images confirms the above findings. IMPRESSION: 1. No evidence of pulmonary embolus. 2. 4.6 cm ascending thoracic aortic aneurysm, previously measuring 4.4 cm. No evidence of dissection. Recommend semi-annual imaging followup by CTA or MRA and referral to cardiothoracic surgery if not already obtained. This recommendation follows 2010 ACCF/AHA/AATS/ACR/ASA/SCA/SCAI/SIR/STS/SVM Guidelines for the Diagnosis and Management of Patients With Thoracic Aortic Disease. Circulation. 2010; 121: J194-R740. Aortic aneurysm NOS (ICD10-I71.9) 3. No acute intrathoracic process. 4. Stable hiatal hernia. Electronically Signed   By: Randa Ngo M.D.   On: 02/05/2022 19:40   DG Chest 2 View  Result Date: 02/05/2022 CLINICAL DATA:  Chest pain and syncope EXAM: CHEST - 2 VIEW COMPARISON:  01/25/2021 FINDINGS: Heart size is normal. Chronic aortic tortuosity. Chronic scarring at the lung bases. No consolidation or lobar collapse. No effusion. No significant bone finding. IMPRESSION: No active disease. Chronic scarring at the lung bases. Aortic tortuosity. Electronically Signed   By: Nelson Chimes M.D.   On: 02/05/2022 13:38     Assessment and Plan:   # CAD s/p DES to RPL 10/07/2021 # Chest pain concerning for unstable  angina # ?Sycnope # HLD # HTN  - Patient had a LHC on 6/26 and is s/p DES to RPL. No other significant disease was noted -Since then he has not taken aspirin. Troponin is flat; no EKG ischemic changes. Less concerned about stent thrombosis. -Pain he states is 1/10 and is at baseline.  -Will get Echo in AM for ?syncope -NPO at midnight for possible stress test in AM -Resume aspirin and Plavix -Has not been on beta-blocker due to previous HR in 50s-low 60s -Continue rosuvastatin and Zetia -Telemetry -We can do small dose of IV nitro drip for chest pain/HTN.   For questions or updates, please contact [facility] Please consult [email or web address] for contact info under    Signed, Jaci Lazier, MD  02/05/2022 11:15 PM

## 2022-02-05 NOTE — Assessment & Plan Note (Signed)
Reported syncopal episode occurring 2 days prior to admit.  Suspect vasovagal or orthostatic syncope however cardiac work-up also in process as above. -Hold home amlodipine, clonidine for now -Started on IV nitro drip so will hold off on orthostatic vitals at this time -Keep on telemetry

## 2022-02-05 NOTE — ED Triage Notes (Signed)
Pt here from home with c/o chest pain for the last 2 days , stent placed in June , had syncopal episode on Monday

## 2022-02-06 ENCOUNTER — Observation Stay (HOSPITAL_COMMUNITY): Payer: Medicare HMO

## 2022-02-06 ENCOUNTER — Observation Stay (HOSPITAL_BASED_OUTPATIENT_CLINIC_OR_DEPARTMENT_OTHER): Payer: Medicare HMO

## 2022-02-06 ENCOUNTER — Observation Stay (INDEPENDENT_AMBULATORY_CARE_PROVIDER_SITE_OTHER): Payer: Medicare HMO

## 2022-02-06 ENCOUNTER — Other Ambulatory Visit: Payer: Self-pay | Admitting: Home Health

## 2022-02-06 DIAGNOSIS — R079 Chest pain, unspecified: Secondary | ICD-10-CM

## 2022-02-06 DIAGNOSIS — I7121 Aneurysm of the ascending aorta, without rupture: Secondary | ICD-10-CM

## 2022-02-06 DIAGNOSIS — I209 Angina pectoris, unspecified: Secondary | ICD-10-CM | POA: Diagnosis not present

## 2022-02-06 DIAGNOSIS — I25110 Atherosclerotic heart disease of native coronary artery with unstable angina pectoris: Secondary | ICD-10-CM | POA: Diagnosis not present

## 2022-02-06 DIAGNOSIS — R55 Syncope and collapse: Secondary | ICD-10-CM

## 2022-02-06 DIAGNOSIS — I200 Unstable angina: Secondary | ICD-10-CM | POA: Diagnosis not present

## 2022-02-06 DIAGNOSIS — I10 Essential (primary) hypertension: Secondary | ICD-10-CM | POA: Diagnosis not present

## 2022-02-06 LAB — ECHOCARDIOGRAM COMPLETE
Area-P 1/2: 3.77 cm2
P 1/2 time: 427 msec
S' Lateral: 2.5 cm

## 2022-02-06 LAB — BASIC METABOLIC PANEL
Anion gap: 12 (ref 5–15)
BUN: 10 mg/dL (ref 8–23)
CO2: 22 mmol/L (ref 22–32)
Calcium: 9.2 mg/dL (ref 8.9–10.3)
Chloride: 104 mmol/L (ref 98–111)
Creatinine, Ser: 1.09 mg/dL (ref 0.61–1.24)
GFR, Estimated: >60 mL/min (ref >60)
Glucose, Bld: 101 mg/dL — ABNORMAL HIGH (ref 70–99)
Potassium: 4 mmol/L (ref 3.5–5.1)
Sodium: 138 mmol/L (ref 135–145)

## 2022-02-06 LAB — HIV ANTIBODY (ROUTINE TESTING W REFLEX): HIV Screen 4th Generation wRfx: NONREACTIVE

## 2022-02-06 LAB — CBC
HCT: 40.7 % (ref 39.0–52.0)
Hemoglobin: 13.4 g/dL (ref 13.0–17.0)
MCH: 28.6 pg (ref 26.0–34.0)
MCHC: 32.9 g/dL (ref 30.0–36.0)
MCV: 87 fL (ref 80.0–100.0)
Platelets: 210 10*3/uL (ref 150–400)
RBC: 4.68 MIL/uL (ref 4.22–5.81)
RDW: 15.9 % — ABNORMAL HIGH (ref 11.5–15.5)
WBC: 11.6 10*3/uL — ABNORMAL HIGH (ref 4.0–10.5)
nRBC: 0 % (ref 0.0–0.2)

## 2022-02-06 MED ORDER — TECHNETIUM TC 99M TETROFOSMIN IV KIT
10.4000 | PACK | Freq: Once | INTRAVENOUS | Status: AC | PRN
  Administered 2022-02-06: 10.4 via INTRAVENOUS

## 2022-02-06 MED ORDER — TECHNETIUM TC 99M TETROFOSMIN IV KIT
31.6000 | PACK | Freq: Once | INTRAVENOUS | Status: AC | PRN
  Administered 2022-02-06: 31.6 via INTRAVENOUS

## 2022-02-06 MED ORDER — REGADENOSON 0.4 MG/5ML IV SOLN
INTRAVENOUS | Status: AC
  Administered 2022-02-06: 0.4 mg via INTRAVENOUS
  Filled 2022-02-06: qty 5

## 2022-02-06 MED ORDER — REGADENOSON 0.4 MG/5ML IV SOLN
0.4000 mg | Freq: Once | INTRAVENOUS | Status: AC
  Filled 2022-02-06: qty 5

## 2022-02-06 NOTE — Progress Notes (Signed)
DAILY PROGRESS NOTE   Patient Name: Joe Arroyo Date of Encounter: 02/06/2022 Cardiologist: Shirlee More, MD  Chief Complaint   1-2/10 chest pressure  Patient Profile   Joe Arroyo is a 66 y.o. male with a hx of  CAD s/p DES to RPL 10/07/2021, HTN, HLD, thoracic ascending aortic aneurysm  who is being seen 02/05/2022 for the evaluation of chest pain at the request of Dr. Posey Pronto  Subjective   Joe Arroyo reports persistent 1-2 out of 10 chest discomfort.  He says this has been present for 2 years after he had really bad viral symptoms in November or December 2021.  He had recent PCI in June 2023 for chest discomfort that radiated to his right shoulder which she said resolved after that however the chest discomfort and shortness of breath did not resolve.  He has seen pulmonary and they did not feel that this was primarily pulmonary.  For some reason he has been on monotherapy Plavix but had stopped aspirin, in fact, he may not have even taken aspirin after his recent stent.  He is back on dual antiplatelet therapy.  Presentation was not consistent with in-stent thrombosis.  His last cath in June demonstrated RPL B stenosis but otherwise showed no significant coronary disease.  Troponin here has been negative x2.  Additionally, he had a syncopal episode.  No prodrome with some abrasions to his nose and his hands.  This is more concerning for possible cardiac etiology.  Overnight he has developed headache on nitroglycerin but reports the nitro has not improved his chest discomfort.  CT angiogram yesterday was negative for PE or dissection but did show 4.6 cm ascending thoracic aortic aneurysm which is slightly larger than previously measured at 4.4 cm.  He does have a stable hiatal hernia, which was considered small by radiology.  Objective   Vitals:   02/06/22 0637 02/06/22 0715 02/06/22 0730 02/06/22 0745  BP:  125/81 (!) 118/98 112/80  Pulse:  78 96 87  Resp:  '17 13 20  '$ Temp: 99.3 F  (37.4 C)     TempSrc: Oral     SpO2:  99% 98% 97%    Intake/Output Summary (Last 24 hours) at 02/06/2022 0845 Last data filed at 02/06/2022 0113 Gross per 24 hour  Intake 11.54 ml  Output --  Net 11.54 ml   There were no vitals filed for this visit.  Physical Exam   General appearance: alert and no distress Neck: no carotid bruit, no JVD, and thyroid not enlarged, symmetric, no tenderness/mass/nodules Lungs: clear to auscultation bilaterally Heart: regular rate and rhythm Abdomen: soft, non-tender; bowel sounds normal; no masses,  no organomegaly Extremities: extremities normal, atraumatic, no cyanosis or edema Pulses: 2+ and symmetric Skin: Skin color, texture, turgor normal. No rashes or lesions Neurologic: Grossly normal Psych: Pleasant  Inpatient Medications    Scheduled Meds:  aspirin EC  81 mg Oral Daily   clopidogrel  75 mg Oral Daily   [START ON 02/07/2022] enoxaparin (LOVENOX) injection  40 mg Subcutaneous Q24H   ezetimibe  10 mg Oral Daily   rosuvastatin  40 mg Oral Daily   sodium chloride flush  3 mL Intravenous Q12H    Continuous Infusions:   PRN Meds: acetaminophen **OR** acetaminophen, ondansetron **OR** ondansetron (ZOFRAN) IV, senna-docusate   Labs   Results for orders placed or performed during the hospital encounter of 02/05/22 (from the past 48 hour(s))  Basic metabolic panel     Status: Abnormal  Collection Time: 02/05/22  1:11 PM  Result Value Ref Range   Sodium 138 135 - 145 mmol/L   Potassium 3.8 3.5 - 5.1 mmol/L   Chloride 103 98 - 111 mmol/L   CO2 21 (L) 22 - 32 mmol/L   Glucose, Bld 91 70 - 99 mg/dL    Comment: Glucose reference range applies only to samples taken after fasting for at least 8 hours.   BUN 11 8 - 23 mg/dL   Creatinine, Ser 1.27 (H) 0.61 - 1.24 mg/dL   Calcium 9.7 8.9 - 10.3 mg/dL   GFR, Estimated >60 >60 mL/min    Comment: (NOTE) Calculated using the CKD-EPI Creatinine Equation (2021)    Anion gap 14 5 - 15     Comment: Performed at [facility] [address] [place], [place] [address]  CBC     Status: Abnormal   Collection Time: 02/05/22  1:11 PM  Result Value Ref Range   WBC 8.7 4.0 - 10.5 K/uL   RBC 5.31 4.22 - 5.81 MIL/uL   Hemoglobin 14.9 13.0 - 17.0 g/dL   HCT 44.4 39.0 - 52.0 %   MCV 83.6 80.0 - 100.0 fL   MCH 28.1 26.0 - 34.0 pg   MCHC 33.6 30.0 - 36.0 g/dL   RDW 15.6 (H) 11.5 - 15.5 %   Platelets 263 150 - 400 K/uL   nRBC 0.0 0.0 - 0.2 %    Comment: Performed at [facility] [address] [place], [place] [address]  Troponin I (High Sensitivity)     Status: None   Collection Time: 02/05/22  1:11 PM  Result Value Ref Range   Troponin I (High Sensitivity) 11 <18 ng/L    Comment: (NOTE) Elevated high sensitivity troponin I (hsTnI) values and significant  changes across serial measurements may suggest ACS but many other  chronic and acute conditions are known to elevate hsTnI results.  Refer to the "Links" section for chest pain algorithms and additional  guidance. Performed at [facility], [place] [address], [place], [place] [address]   Troponin I (High Sensitivity)     Status: None   Collection Time: 02/05/22  7:00 PM  Result Value Ref Range   Troponin I (High Sensitivity) 13 <18 ng/L    Comment: (NOTE) Elevated high sensitivity troponin I (hsTnI) values and significant  changes across serial measurements may suggest ACS but many other  chronic and acute conditions are known to elevate hsTnI results.  Refer to the "Links" section for chest pain algorithms and additional  guidance. Performed at [facility], [place] [address] [place], [place] [address]   CBC     Status: Abnormal   Collection Time: 02/06/22  3:33 AM  Result Value Ref Range   WBC 11.6 (H) 4.0 - 10.5 K/uL   RBC 4.68 4.22 - 5.81 MIL/uL   Hemoglobin 13.4 13.0 - 17.0 g/dL   HCT 40.7 39.0 - 52.0 %   MCV 87.0 80.0 - 100.0 fL   MCH 28.6 26.0 - 34.0 pg   MCHC 32.9 30.0 - 36.0 g/dL    RDW 15.9 (H) 11.5 - 15.5 %   Platelets 210 150 - 400 K/uL   nRBC 0.0 0.0 - 0.2 %    Comment: Performed at [facility], [place] [address], [place], [place] [address]  Basic metabolic panel     Status: Abnormal   Collection Time: 02/06/22  3:40 AM  Result Value Ref  Range   Sodium 138 135 - 145 mmol/L   Potassium 4.0 3.5 - 5.1 mmol/L   Chloride 104 98 - 111 mmol/L   CO2 22 22 - 32 mmol/L   Glucose, Bld 101 (H) 70 - 99 mg/dL    Comment: Glucose reference range applies only to samples taken after fasting for at least 8 hours.   BUN 10 8 - 23 mg/dL   Creatinine, Ser 1.09 0.61 - 1.24 mg/dL   Calcium 9.2 8.9 - 10.3 mg/dL   GFR, Estimated >60 >60 mL/min    Comment: (NOTE) Calculated using the CKD-EPI Creatinine Equation (2021)    Anion gap 12 5 - 15    Comment: Performed at [facility] [address] [place], [place] [address]    ECG   Sinus tachycardia at 111- Personally Reviewed  Telemetry   Sinus rhythm with PVCs- Personally Reviewed  Radiology    CT Head Wo Contrast  Result Date: 02/05/2022 CLINICAL DATA:  Minor head trauma.  Loss of consciousness. EXAM: CT HEAD WITHOUT CONTRAST TECHNIQUE: Contiguous axial images were obtained from the base of the skull through the vertex without intravenous contrast. RADIATION DOSE REDUCTION: This exam was performed according to the departmental dose-optimization program which includes automated exposure control, adjustment of the mA and/or kV according to patient size and/or use of iterative reconstruction technique. COMPARISON:  MRI brain 02/14/2020 FINDINGS: Brain: The ventricles are normal in size and configuration. The basilar cisterns are patent. No mass, mass effect, or midline shift. No acute intracranial hemorrhage is seen. No abnormal extra-axial fluid collection. Minimal periventricular white matter hypodensities, likely chronic ischemic white matter changes and similar to the FLAIR signal on prior MRI. Preservation of the  normal cortical gray-white interface without CT evidence of an acute major vascular territorial cortical based infarction. Vascular: No hyperdense vessel or unexpected calcification. Skull: Normal. Negative for fracture or focal lesion. Sinuses/Orbits: The visualized orbits are unremarkable. The visualized paranasal sinuses and mastoid air cells are clear. Other: None. IMPRESSION: 1. No acute intracranial hemorrhage or calvarial fracture. 2. Minimal periventricular white matter hypodensities, likely chronic ischemic white matter changes. Electronically Signed   By: Yvonne Kendall M.D.   On: 02/05/2022 19:42   CT Angio Chest Pulmonary Embolism (PE) W or WO Contrast  Result Date: 02/05/2022 CLINICAL DATA:  Chest pain and shortness of breath, loss of consciousness EXAM: CT ANGIOGRAPHY CHEST WITH CONTRAST TECHNIQUE: Multidetector CT imaging of the chest was performed using the standard protocol during bolus administration of intravenous contrast. Multiplanar CT image reconstructions and MIPs were obtained to evaluate the vascular anatomy. RADIATION DOSE REDUCTION: This exam was performed according to the departmental dose-optimization program which includes automated exposure control, adjustment of the mA and/or kV according to patient size and/or use of iterative reconstruction technique. CONTRAST:  21m OMNIPAQUE IOHEXOL 350 MG/ML SOLN COMPARISON:  08/29/2021 FINDINGS: Cardiovascular: This is a technically adequate evaluation of the pulmonary vasculature. No filling defects or pulmonary emboli. The heart is unremarkable without pericardial effusion. 4.6 cm ascending thoracic aortic aneurysm. No evidence of dissection. Mediastinum/Nodes: No enlarged mediastinal, hilar, or axillary lymph nodes. Thyroid gland, trachea, and esophagus demonstrate no significant findings. Stable hiatal hernia. Lungs/Pleura: No acute airspace disease, effusion, or pneumothorax. Subsegmental atelectasis at the lung bases. Central airways  are patent. The sub 5 mm right middle and right lower lobe pulmonary nodules reported on prior study are not well visualized on this exam. There are no new pulmonary nodules or masses. Upper Abdomen: No  acute abnormality. Musculoskeletal: No acute or destructive bony lesions. Reconstructed images demonstrate no additional findings. Review of the MIP images confirms the above findings. IMPRESSION: 1. No evidence of pulmonary embolus. 2. 4.6 cm ascending thoracic aortic aneurysm, previously measuring 4.4 cm. No evidence of dissection. Recommend semi-annual imaging followup by CTA or MRA and referral to cardiothoracic surgery if not already obtained. This recommendation follows 2010 ACCF/AHA/AATS/ACR/ASA/SCA/SCAI/SIR/STS/SVM Guidelines for the Diagnosis and Management of Patients With Thoracic Aortic Disease. Circulation. 2010; 121: A193-X902. Aortic aneurysm NOS (ICD10-I71.9) 3. No acute intrathoracic process. 4. Stable hiatal hernia. Electronically Signed   By: Randa Ngo M.D.   On: 02/05/2022 19:40   DG Chest 2 View  Result Date: 02/05/2022 CLINICAL DATA:  Chest pain and syncope EXAM: CHEST - 2 VIEW COMPARISON:  01/25/2021 FINDINGS: Heart size is normal. Chronic aortic tortuosity. Chronic scarring at the lung bases. No consolidation or lobar collapse. No effusion. No significant bone finding. IMPRESSION: No active disease. Chronic scarring at the lung bases. Aortic tortuosity. Electronically Signed   By: Nelson Chimes M.D.   On: 02/05/2022 13:38    Cardiac Studies   N/A  Assessment   Principal Problem:   Unstable angina [facility]) Active Problems:   Essential hypertension, benign   Hyperlipidemia   Syncope   OSA (obstructive sleep apnea)   Thoracic ascending aortic aneurysm [facility])   Coronary artery disease   Plan   Mr. Frayre has ruled out for MI but has persistent chest discomfort.  Cath in June showed essentially normal coronaries except for a lesion in the R PLB branch for which he  underwent drug-eluting stent placement.  He has not been compliant with aspirin but reports taking Plavix.  He is now back on dual antiplatelet therapy.  Seems rather unlikely he has had any progression of disease in the last several months however we will get a Myoview stress test today.  I am more concerned about his syncopal episode.  There was no prodrome for this.  He would benefit from a 30-day monitor at discharge.  We will stop nitroglycerin infusion as he reports headache and no improvement in his discomfort.  Time Spent Directly with Patient:  I have spent a total of 25 minutes with the patient reviewing hospital notes, telemetry, EKGs, labs and examining the patient as well as establishing an assessment and plan that was discussed personally with the patient.  > 50% of time was spent in direct patient care.  Length of Stay:  LOS: 0 days   Pixie Casino, MD, [facility], Thermal Director of the [facility]  [facility] Diplomate of the American Board of Clinical Lipidology Attending Cardiologist  Direct Dial: [phone]  Fax: [phone]  Website:  [email or web address] 02/06/2022, 8:45 AM

## 2022-02-06 NOTE — ED Notes (Signed)
ED TO INPATIENT HANDOFF REPORT  ED Nurse Name and Phone #: [phone]  S Name/Age/Gender Joe Arroyo 66 y.o. male Room/Bed: 001C/001C  Code Status   Code Status: DNR  Home/SNF/Other Home Patient oriented to: self, place, time, and situation Is this baseline? Yes   Triage Complete: Triage complete  Chief Complaint Unstable angina [facility]) [I20.0]  Triage Note Pt here from home with c/o chest pain for the last 2 days , stent placed in June , had syncopal episode on Monday    Allergies Allergies  Allergen Reactions   Bystolic [Nebivolol Hcl] Other (See Comments)    bradycardia   Carvedilol Other (See Comments)    bradycardia   Pravastatin Other (See Comments)    "made my joints hurt"   Hctz [Hydrochlorothiazide] Rash   Hydralazine Rash   Penicillins Rash    Level of Care/Admitting Diagnosis ED Disposition     ED Disposition  Admit   Condition  --   [place]: [place] [[id number]]  Level of Care: Progressive [102]  Admit to Progressive based on following criteria: CARDIOVASCULAR & THORACIC of moderate stability with acute coronary syndrome symptoms/low risk myocardial infarction/hypertensive urgency/arrhythmias/heart failure potentially compromising stability and stable post cardiovascular intervention patients.  May place patient in observation at [facility] or [place] if equivalent level of care is available:: No  Covid Evaluation: Asymptomatic - no recent exposure (last 10 days) testing not required  Diagnosis: Unstable angina [facility]) [[id number]]  Admitting Physician: Lenore Cordia [[id number]]  Attending Physician: Lenore Cordia [[id number]]          B Medical/Surgery History Past Medical History:  Diagnosis Date   Atypical chest pain 01/07/2021   Chronic idiopathic constipation 01/07/2021   Cough 09/05/2019   Dyspnea    Essential hypertension, benign 05/14/2018   GERD (gastroesophageal reflux disease) 09/28/2020   Hiatal hernia  09/28/2020   History of hiatal hernia    History of kidney stones    Hyperlipidemia 05/14/2018   Malaise 09/05/2019   Non-seasonal allergic rhinitis due to pollen 08/16/2019   OSA (obstructive sleep apnea) 09/28/2020   Other headache syndrome 08/16/2019   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   Shortness of breath 09/05/2019   Sinus bradycardia 05/14/2018   Syncope 09/05/2019   Thoracic ascending aortic aneurysm (HCC)    ascending TAA 4.6 x 4.4 cm 10/03/20 CTA chest   Past Surgical History:  Procedure Laterality Date   COLONOSCOPY     CORONARY STENT INTERVENTION N/A 10/07/2021   Procedure: CORONARY STENT INTERVENTION;  Surgeon: Leonie Man, MD;  Location: [place] CV LAB;  Service: Cardiovascular;  Laterality: N/A;   ESOPHAGOGASTRODUODENOSCOPY N/A 12/05/2020   Procedure: ESOPHAGOGASTRODUODENOSCOPY (EGD);  Surgeon: Lajuana Matte, MD;  Location: [facility] OR;  Service: Thoracic;  Laterality: N/A;   LAMINECTOMY     MOLE REMOVAL     PROSTATE BIOPSY     RIGHT/LEFT HEART CATH AND CORONARY ANGIOGRAPHY N/A 10/07/2021   Procedure: RIGHT/LEFT HEART CATH AND CORONARY ANGIOGRAPHY;  Surgeon: Leonie Man, MD;  Location: [place] CV LAB;  Service: Cardiovascular;  Laterality: N/A;   TONSILLECTOMY AND ADENOIDECTOMY     UPPER GASTROINTESTINAL ENDOSCOPY     XI ROBOTIC ASSISTED PARAESOPHAGEAL HERNIA REPAIR N/A 12/05/2020   Procedure: XI ROBOTIC ASSISTED PARAESOPHAGEAL HERNIA REPAIR AND FUNDOPLICATION;  Surgeon: Lajuana Matte, MD;  Location: [place];  Service: Thoracic;  Laterality: N/A;     A IV Location/Drains/Wounds Patient Lines/Drains/Airways Status     Active Line/Drains/Airways  Name Placement date Placement time Site Days   Peripheral IV 02/05/22 20 G Right Antecubital 02/05/22  1718  Antecubital  1   Incision (Closed) 12/05/20 Abdomen Other (Comment) 12/05/20  1319  -- 428            Intake/Output Last 24 hours  Intake/Output Summary (Last 24 hours) at 02/06/2022  1258 Last data filed at 02/06/2022 0113 Gross per 24 hour  Intake 11.54 ml  Output --  Net 11.54 ml    Labs/Imaging Results for orders placed or performed during the hospital encounter of 02/05/22 (from the past 48 hour(s))  Basic metabolic panel     Status: Abnormal   Collection Time: 02/05/22  1:11 PM  Result Value Ref Range   Sodium 138 135 - 145 mmol/L   Potassium 3.8 3.5 - 5.1 mmol/L   Chloride 103 98 - 111 mmol/L   CO2 21 (L) 22 - 32 mmol/L   Glucose, Bld 91 70 - 99 mg/dL    Comment: Glucose reference range applies only to samples taken after fasting for at least 8 hours.   BUN 11 8 - 23 mg/dL   Creatinine, Ser 1.27 (H) 0.61 - 1.24 mg/dL   Calcium 9.7 8.9 - 10.3 mg/dL   GFR, Estimated >60 >60 mL/min    Comment: (NOTE) Calculated using the CKD-EPI Creatinine Equation (2021)    Anion gap 14 5 - 15    Comment: Performed at [place] [address], [place], [place] [address]  CBC     Status: Abnormal   Collection Time: 02/05/22  1:11 PM  Result Value Ref Range   WBC 8.7 4.0 - 10.5 K/uL   RBC 5.31 4.22 - 5.81 MIL/uL   Hemoglobin 14.9 13.0 - 17.0 g/dL   HCT 44.4 39.0 - 52.0 %   MCV 83.6 80.0 - 100.0 fL   MCH 28.1 26.0 - 34.0 pg   MCHC 33.6 30.0 - 36.0 g/dL   RDW 15.6 (H) 11.5 - 15.5 %   Platelets 263 150 - 400 K/uL   nRBC 0.0 0.0 - 0.2 %    Comment: Performed at [place] [address], [place], [place] [address]  Troponin I (High Sensitivity)     Status: None   Collection Time: 02/05/22  1:11 PM  Result Value Ref Range   Troponin I (High Sensitivity) 11 <18 ng/L    Comment: (NOTE) Elevated high sensitivity troponin I (hsTnI) values and significant  changes across serial measurements may suggest ACS but many other  chronic and acute conditions are known to elevate hsTnI results.  Refer to the "Links" section for chest pain algorithms and additional  guidance. Performed at [facility], [place] [address], [place], [place] [address]    Troponin I (High Sensitivity)     Status: None   Collection Time: 02/05/22  7:00 PM  Result Value Ref Range   Troponin I (High Sensitivity) 13 <18 ng/L    Comment: (NOTE) Elevated high sensitivity troponin I (hsTnI) values and significant  changes across serial measurements may suggest ACS but many other  chronic and acute conditions are known to elevate hsTnI results.  Refer to the "Links" section for chest pain algorithms and additional  guidance. Performed at [facility], [place] [address] [place], [place] [address]   CBC     Status: Abnormal   Collection Time: 02/06/22  3:33 AM  Result Value Ref Range   WBC 11.6 (H) 4.0 -  10.5 K/uL   RBC 4.68 4.22 - 5.81 MIL/uL   Hemoglobin 13.4 13.0 - 17.0 g/dL   HCT 40.7 39.0 - 52.0 %   MCV 87.0 80.0 - 100.0 fL   MCH 28.6 26.0 - 34.0 pg   MCHC 32.9 30.0 - 36.0 g/dL   RDW 15.9 (H) 11.5 - 15.5 %   Platelets 210 150 - 400 K/uL   nRBC 0.0 0.0 - 0.2 %    Comment: Performed at [place] [address] [place], [place] [address]  Basic metabolic panel     Status: Abnormal   Collection Time: 02/06/22  3:40 AM  Result Value Ref Range   Sodium 138 135 - 145 mmol/L   Potassium 4.0 3.5 - 5.1 mmol/L   Chloride 104 98 - 111 mmol/L   CO2 22 22 - 32 mmol/L   Glucose, Bld 101 (H) 70 - 99 mg/dL    Comment: Glucose reference range applies only to samples taken after fasting for at least 8 hours.   BUN 10 8 - 23 mg/dL   Creatinine, Ser 1.09 0.61 - 1.24 mg/dL   Calcium 9.2 8.9 - 10.3 mg/dL   GFR, Estimated >60 >60 mL/min    Comment: (NOTE) Calculated using the CKD-EPI Creatinine Equation (2021)    Anion gap 12 5 - 15    Comment: Performed at [place] [address] [place], [place] [address]  HIV Antibody (routine testing w rflx)     Status: None   Collection Time: 02/06/22  3:40 AM  Result Value Ref Range   HIV Screen 4th Generation wRfx Non Reactive Non Reactive    Comment: Performed at [facility], [place] [address], [place], [place] [address]   ECHOCARDIOGRAM COMPLETE  Result Date: 02/06/2022    ECHOCARDIOGRAM REPORT   Patient Name:   Joe Arroyo Date of Exam: 02/06/2022 Medical Rec #:  [id number]     Height:       72.0 in Accession #:    [id number]    Weight:       204.0 lb Date of Birth:  1955-12-30     BSA:          2.149 m Patient Age:    9 years      BP:           139/85 mmHg Patient Gender: M             HR:           84 bpm. Exam Location:  Inpatient Procedure: 2D Echo Indications:    Chest pain. syncope  History:        Patient has prior history of Echocardiogram examinations, most                 recent 09/24/2021. CAD, Abnormal ECG, Signs/Symptoms:Chest Pain                 and Syncope; Risk Factors:Hypertension, Dyslipidemia and Sleep                 Apnea.  Sonographer:    Johny Chess RDCS Referring Phys: [id number] [place]  Sonographer Comments: Suboptimal subcostal window. IMPRESSIONS  1. Left ventricular ejection fraction, by estimation, is 60 to 65%. The left ventricle has normal function. The left ventricle has no regional wall motion abnormalities. Left ventricular diastolic parameters are indeterminate.  2. Right ventricular systolic function is normal. The right ventricular size is normal. Tricuspid regurgitation  signal is inadequate for assessing PA pressure.  3. The mitral valve is normal in structure. Trivial mitral valve regurgitation. No evidence of mitral stenosis.  4. The aortic valve is normal in structure. Aortic valve regurgitation is mild to moderate. No aortic stenosis is present.  5. There is dilatation of the ascending aorta, measuring 41 mm.  6. The inferior vena cava is normal in size with greater than 50% respiratory variability, suggesting right atrial pressure of 3 mmHg. FINDINGS  Left Ventricle: Left ventricular ejection fraction, by estimation, is 60 to 65%. The left ventricle has normal function. The left ventricle has no regional wall motion abnormalities. The  left ventricular internal cavity size was normal in size. There is  no left ventricular hypertrophy. Left ventricular diastolic parameters are indeterminate. Right Ventricle: The right ventricular size is normal. No increase in right ventricular wall thickness. Right ventricular systolic function is normal. Tricuspid regurgitation signal is inadequate for assessing PA pressure. Left Atrium: Left atrial size was normal in size. Right Atrium: Right atrial size was normal in size. Pericardium: There is no evidence of pericardial effusion. Presence of epicardial fat layer. Mitral Valve: The mitral valve is normal in structure. Trivial mitral valve regurgitation. No evidence of mitral valve stenosis. Tricuspid Valve: The tricuspid valve is normal in structure. Tricuspid valve regurgitation is trivial. No evidence of tricuspid stenosis. Aortic Valve: The aortic valve is normal in structure. Aortic valve regurgitation is mild to moderate. Aortic regurgitation PHT measures 427 msec. No aortic stenosis is present. Pulmonic Valve: The pulmonic valve was normal in structure. Pulmonic valve regurgitation is not visualized. No evidence of pulmonic stenosis. Aorta: There is dilatation of the ascending aorta, measuring 41 mm. Venous: The inferior vena cava is normal in size with greater than 50% respiratory variability, suggesting right atrial pressure of 3 mmHg. IAS/Shunts: No atrial level shunt detected by color flow Doppler.  LEFT VENTRICLE PLAX 2D LVIDd:         4.50 cm   Diastology LVIDs:         2.50 cm   LV e' medial:    6.20 cm/s LV PW:         0.90 cm   LV E/e' medial:  12.0 LV IVS:        1.00 cm   LV e' lateral:   9.14 cm/s LVOT diam:     1.90 cm   LV E/e' lateral: 8.1 LV SV:         72 LV SV Index:   33 LVOT Area:     2.84 cm  RIGHT VENTRICLE RV S prime:     16.50 cm/s TAPSE (M-mode): 2.7 cm LEFT ATRIUM             Index        RIGHT ATRIUM           Index LA diam:        3.80 cm 1.77 cm/m   RA Area:     14.30 cm LA  Vol (A2C):   41.0 ml 19.08 ml/m  RA Volume:   34.30 ml  15.96 ml/m LA Vol (A4C):   53.0 ml 24.67 ml/m LA Biplane Vol: 48.1 ml 22.39 ml/m  AORTIC VALVE LVOT Vmax:   138.00 cm/s LVOT Vmean:  90.300 cm/s LVOT VTI:    0.253 m AI PHT:      427 msec  AORTA Ao Root diam: 3.60 cm Ao Asc diam:  4.20 cm MITRAL VALVE MV Area (PHT): 3.77  cm     SHUNTS MV Decel Time: 201 msec     Systemic VTI:  0.25 m MV E velocity: 74.40 cm/s   Systemic Diam: 1.90 cm MV A velocity: 124.00 cm/s MV E/A ratio:  0.60 Kardie Tobb DO Electronically signed by Berniece Salines DO Signature Date/Time: 02/06/2022/12:33:35 PM    Final    CT Head Wo Contrast  Result Date: 02/05/2022 CLINICAL DATA:  Minor head trauma.  Loss of consciousness. EXAM: CT HEAD WITHOUT CONTRAST TECHNIQUE: Contiguous axial images were obtained from the base of the skull through the vertex without intravenous contrast. RADIATION DOSE REDUCTION: This exam was performed according to the departmental dose-optimization program which includes automated exposure control, adjustment of the mA and/or kV according to patient size and/or use of iterative reconstruction technique. COMPARISON:  MRI brain 02/14/2020 FINDINGS: Brain: The ventricles are normal in size and configuration. The basilar cisterns are patent. No mass, mass effect, or midline shift. No acute intracranial hemorrhage is seen. No abnormal extra-axial fluid collection. Minimal periventricular white matter hypodensities, likely chronic ischemic white matter changes and similar to the FLAIR signal on prior MRI. Preservation of the normal cortical gray-white interface without CT evidence of an acute major vascular territorial cortical based infarction. Vascular: No hyperdense vessel or unexpected calcification. Skull: Normal. Negative for fracture or focal lesion. Sinuses/Orbits: The visualized orbits are unremarkable. The visualized paranasal sinuses and mastoid air cells are clear. Other: None. IMPRESSION: 1. No acute  intracranial hemorrhage or calvarial fracture. 2. Minimal periventricular white matter hypodensities, likely chronic ischemic white matter changes. Electronically Signed   By: Yvonne Kendall M.D.   On: 02/05/2022 19:42   CT Angio Chest Pulmonary Embolism (PE) W or WO Contrast  Result Date: 02/05/2022 CLINICAL DATA:  Chest pain and shortness of breath, loss of consciousness EXAM: CT ANGIOGRAPHY CHEST WITH CONTRAST TECHNIQUE: Multidetector CT imaging of the chest was performed using the standard protocol during bolus administration of intravenous contrast. Multiplanar CT image reconstructions and MIPs were obtained to evaluate the vascular anatomy. RADIATION DOSE REDUCTION: This exam was performed according to the departmental dose-optimization program which includes automated exposure control, adjustment of the mA and/or kV according to patient size and/or use of iterative reconstruction technique. CONTRAST:  48m OMNIPAQUE IOHEXOL 350 MG/ML SOLN COMPARISON:  08/29/2021 FINDINGS: Cardiovascular: This is a technically adequate evaluation of the pulmonary vasculature. No filling defects or pulmonary emboli. The heart is unremarkable without pericardial effusion. 4.6 cm ascending thoracic aortic aneurysm. No evidence of dissection. Mediastinum/Nodes: No enlarged mediastinal, hilar, or axillary lymph nodes. Thyroid gland, trachea, and esophagus demonstrate no significant findings. Stable hiatal hernia. Lungs/Pleura: No acute airspace disease, effusion, or pneumothorax. Subsegmental atelectasis at the lung bases. Central airways are patent. The sub 5 mm right middle and right lower lobe pulmonary nodules reported on prior study are not well visualized on this exam. There are no new pulmonary nodules or masses. Upper Abdomen: No acute abnormality. Musculoskeletal: No acute or destructive bony lesions. Reconstructed images demonstrate no additional findings. Review of the MIP images confirms the above findings.  IMPRESSION: 1. No evidence of pulmonary embolus. 2. 4.6 cm ascending thoracic aortic aneurysm, previously measuring 4.4 cm. No evidence of dissection. Recommend semi-annual imaging followup by CTA or MRA and referral to cardiothoracic surgery if not already obtained. This recommendation follows 2010 ACCF/AHA/AATS/ACR/ASA/SCA/SCAI/SIR/STS/SVM Guidelines for the Diagnosis and Management of Patients With Thoracic Aortic Disease. Circulation. 2010; 121:: L491-P915 Aortic aneurysm NOS (ICD10-I71.9) 3. No acute intrathoracic process. 4. Stable hiatal hernia. Electronically  Signed   By: Randa Ngo M.D.   On: 02/05/2022 19:40   DG Chest 2 View  Result Date: 02/05/2022 CLINICAL DATA:  Chest pain and syncope EXAM: CHEST - 2 VIEW COMPARISON:  01/25/2021 FINDINGS: Heart size is normal. Chronic aortic tortuosity. Chronic scarring at the lung bases. No consolidation or lobar collapse. No effusion. No significant bone finding. IMPRESSION: No active disease. Chronic scarring at the lung bases. Aortic tortuosity. Electronically Signed   By: Nelson Chimes M.D.   On: 02/05/2022 13:38    Pending Labs Unresulted Labs (From admission, onward)    None       Vitals/Pain Today's Vitals   02/06/22 0900 02/06/22 1030 02/06/22 1100 02/06/22 1209  BP: 125/78 139/85 (!) 144/96   Pulse: 71 77 97   Resp: 18 17 (!) 24   Temp:   99.2 F (37.3 C)   TempSrc:   Oral   SpO2: 99% 100% 97%   PainSc:    2     Isolation Precautions No active isolations  Medications Medications  enoxaparin (LOVENOX) injection 40 mg (has no administration in time range)  sodium chloride flush (NS) 0.9 % injection 3 mL (3 mLs Intravenous Given 02/06/22 1028)  acetaminophen (TYLENOL) tablet 650 mg (has no administration in time range)    Or  acetaminophen (TYLENOL) suppository 650 mg (has no administration in time range)  ondansetron (ZOFRAN) tablet 4 mg (has no administration in time range)    Or  ondansetron (ZOFRAN) injection 4 mg  (has no administration in time range)  senna-docusate (Senokot-S) tablet 1 tablet (has no administration in time range)  aspirin EC tablet 81 mg (81 mg Oral Given 02/06/22 1028)  clopidogrel (PLAVIX) tablet 75 mg (75 mg Oral Given 02/06/22 1027)  ezetimibe (ZETIA) tablet 10 mg (10 mg Oral Given 02/06/22 1028)  rosuvastatin (CRESTOR) tablet 40 mg (40 mg Oral Given 02/06/22 1027)  iohexol (OMNIPAQUE) 350 MG/ML injection 75 mL (75 mLs Intravenous Contrast Given 02/05/22 1916)    Mobility walks Moderate fall risk   Focused Assessments Cardiac Assessment Handoff:    No results found for: "CKTOTAL", "CKMB", "CKMBINDEX", "TROPONINI" Lab Results  Component Value Date   DDIMER 1.07 (H) 09/28/2020   Does the Patient currently have chest pain? Yes    R Recommendations: See Admitting Provider Note  Report given to:   Additional Notes:

## 2022-02-06 NOTE — Discharge Summary (Signed)
[name] HYW:[id number] DOB: 25-Feb-1956 DOA: 02/05/2022  PCP: [name], MD  Admit date: 02/05/2022  Discharge date: 02/06/2022  Admitted From: Home   disposition: Home  Recommendations for Outpatient Follow-up:   Follow up with PCP in 1-2 weeks  Home Health: N/A Equipment/Devices: N/A Consultations: Cardiology Discharge Condition: Stable CODE STATUS: Full Diet Recommendation: Heart Healthy   Diet Order             Diet Heart Room service appropriate? Yes; Fluid consistency: Thin  Diet effective now                    Chief Complaint  Patient presents with   Chest Pain   Loss of Consciousness     Brief history of present illness from the day of admission and additional interim summary    Joe Arroyo is a 66 y.o. male with medical history significant for CAD s/p DES to RPL 10/07/2021, HTN, HLD, thoracic ascending aortic aneurysm, OSA not using CPAP who presented to the ED for evaluation of chest pain.   Patient states on 10/23 he was taking a class which time he was sitting for about 4 hours with intermittent 15-minute breaks.  After his class he was walking back to his car when he suddenly felt lightheaded and as if his body was "floating."  He felt himself falling to the ground before he lost consciousness.  He did hit his face when he fell resulting in abrasions to his mid lower forehead, tip of the nose, and lips/chin.   He says since then he has been having pressure-like discomfort across his chest which has been occurring with minimal exertion as well as at rest.  Chest pressure has been on and off but sometimes last for prolonged period of time.  He also has been having a "quivering" sensation throughout his body at times.     He reports chronic shortness of breath which has been  ongoing for 2 years and not changed from his baseline.  He has been seen by pulmonology and found to have mild restrictive defect but no clear etiology of his dyspnea.  He has been taking Advair and Lasix with no change in the symptoms.                                                                     Hospital Course    Chest pain in patient with recent DES CTA done yesterday was negative for any pulmonary embolus. Patient was seen by cardiology today who are recommending stress test. Tress test done today was normal. Echocardiogram done today shows normal EF 65% with no RWMA, RV function is also normal.  He does have mild to moderate AR with dilation of aortic root at 41 mm.  Thoracic  aneurysm seen CT is stable. Continue aspirin, Plavix and rosuvastatin   Syncope Suspect this is more likely vasovagal syncope given prodrome and his admonition to himself that he should not hit his face. Echocardiogram without any AS, however s he does have AR Of note, both clonidine and tamsulosin can be associated with syncope.  Patient to discuss possible med changes with his PCP  Chronic shortness of breath Is likely secondary to deconditioning Patient states he has been seen by both a pulmonologist and a cardiologist both of which have told him "there is nothing wrong with your heart and there is nothing wrong with your lungs".  Patient has been working with cardiopulmonary rehab however progress has been very slow.  Patient admits he is not very motivated to exercise but does not like being short of breath all the time.  Recommended starting with very slow exercise and increasing a little bit of every week as he can tolerate.  HTN As noted above, can consider changing clonidine to beta-blocker upon discharge Continue amlodipine   BPH Continue tamsulosin   OSA And urged to start using CPAP     Discharge diagnosis     Principal Problem:   Unstable angina ([place]) Active Problems:    Coronary artery disease   Syncope   Essential hypertension, benign   Hyperlipidemia   OSA (obstructive sleep apnea)   Thoracic ascending aortic aneurysm [facility])    Discharge instructions    Discharge Instructions     Discharge instructions   Complete by: As directed    Remember to tell your PCP that you are stress test was normal, your echocardiogram was normal and that your lung CT was also normal.  As we discussed, you can start exercising a little bit at a time and increase as you can.  This should help you with your exercise tolerance and difficulty breathing.  The heart doctors are going to put a patch on you so they can monitor your heart rate for the next couple of weeks in case you feel like you are going to faint again, that way they can tell what your heart rhythm is doing if you do faint again.  They will see you in follow-up and they will make that appointment for you.   Increase activity slowly   Complete by: As directed        Discharge Medications   Allergies as of 02/06/2022       Reactions   Bystolic [nebivolol Hcl] Other (See Comments)   bradycardia   Carvedilol Other (See Comments)   bradycardia   Pravastatin Other (See Comments)   "made my joints hurt"   Hctz [hydrochlorothiazide] Rash   Hydralazine Rash   Penicillins Rash        Medication List     TAKE these medications    amLODipine 5 MG tablet Commonly known as: NORVASC Take 1 tablet (5 mg total) by mouth daily.   aspirin EC 81 MG tablet Take 1 tablet (81 mg total) by mouth daily. Swallow whole.   CENTRUM SILVER ULTRA MENS PO Take 1 tablet by mouth daily.   cloNIDine 0.3 MG tablet Commonly known as: CATAPRES Take 1 tablet (0.3 mg total) by mouth daily.   clopidogrel 75 MG tablet Commonly known as: Plavix Take 1 tablet (75 mg total) by mouth daily.   ezetimibe 10 MG tablet Commonly known as: ZETIA Take 1 tablet (10 mg total) by mouth daily.   finasteride 5 MG tablet Commonly  known as:  PROSCAR Take 5 mg by mouth daily.   FISH OIL PO Take 1 tablet by mouth 2 (two) times daily.   fluticasone-salmeterol 230-21 MCG/ACT inhaler Commonly known as: Advair HFA Inhale 2 puffs into the lungs 2 (two) times daily.   furosemide 40 MG tablet Commonly known as: LASIX Take 1 tablet (40 mg total) by mouth 2 (two) times daily.   ibuprofen 200 MG tablet Commonly known as: ADVIL Take 400 mg by mouth every 6 (six) hours as needed for headache.   meloxicam 15 MG tablet Commonly known as: MOBIC Take 1 tablet (15 mg total) by mouth daily as needed. What changed: reasons to take this   pantoprazole 40 MG tablet Commonly known as: PROTONIX   rosuvastatin 40 MG tablet Commonly known as: Crestor Take 1 tablet (40 mg total) by mouth daily.   tamsulosin 0.4 MG Caps capsule Commonly known as: FLOMAX Take 0.4 mg by mouth daily.          Major procedures and Radiology Reports - PLEASE review detailed and final reports thoroughly  -      ECHOCARDIOGRAM COMPLETE  Result Date: 02/06/2022    ECHOCARDIOGRAM REPORT   Patient Name:   RAHMIR BEEVER Date of Exam: 02/06/2022 Medical Rec #:  [id number]     Height:       72.0 in Accession #:    [id number]    Weight:       204.0 lb Date of Birth:  05-Aug-1955     BSA:          2.149 m Patient Age:    66 years      BP:           139/85 mmHg Patient Gender: M             HR:           84 bpm. Exam Location:  Inpatient Procedure: 2D Echo Indications:    Chest pain. syncope  History:        Patient has prior history of Echocardiogram examinations, most                 recent 09/24/2021. CAD, Abnormal ECG, Signs/Symptoms:Chest Pain                 and Syncope; Risk Factors:Hypertension, Dyslipidemia and Sleep                 Apnea.  Sonographer:    Johny Chess RDCS Referring Phys: [id number] [place]  Sonographer Comments: Suboptimal subcostal window. IMPRESSIONS  1. Left ventricular ejection fraction, by estimation, is 60 to 65%. The  left ventricle has normal function. The left ventricle has no regional wall motion abnormalities. Left ventricular diastolic parameters are indeterminate.  2. Right ventricular systolic function is normal. The right ventricular size is normal. Tricuspid regurgitation signal is inadequate for assessing PA pressure.  3. The mitral valve is normal in structure. Trivial mitral valve regurgitation. No evidence of mitral stenosis.  4. The aortic valve is normal in structure. Aortic valve regurgitation is mild to moderate. No aortic stenosis is present.  5. There is dilatation of the ascending aorta, measuring 41 mm.  6. The inferior vena cava is normal in size with greater than 50% respiratory variability, suggesting right atrial pressure of 3 mmHg. FINDINGS  Left Ventricle: Left ventricular ejection fraction, by estimation, is 60 to 65%. The left ventricle has normal function. The left ventricle has no regional wall motion abnormalities. The left ventricular  internal cavity size was normal in size. There is  no left ventricular hypertrophy. Left ventricular diastolic parameters are indeterminate. Right Ventricle: The right ventricular size is normal. No increase in right ventricular wall thickness. Right ventricular systolic function is normal. Tricuspid regurgitation signal is inadequate for assessing PA pressure. Left Atrium: Left atrial size was normal in size. Right Atrium: Right atrial size was normal in size. Pericardium: There is no evidence of pericardial effusion. Presence of epicardial fat layer. Mitral Valve: The mitral valve is normal in structure. Trivial mitral valve regurgitation. No evidence of mitral valve stenosis. Tricuspid Valve: The tricuspid valve is normal in structure. Tricuspid valve regurgitation is trivial. No evidence of tricuspid stenosis. Aortic Valve: The aortic valve is normal in structure. Aortic valve regurgitation is mild to moderate. Aortic regurgitation PHT measures 427 msec. No  aortic stenosis is present. Pulmonic Valve: The pulmonic valve was normal in structure. Pulmonic valve regurgitation is not visualized. No evidence of pulmonic stenosis. Aorta: There is dilatation of the ascending aorta, measuring 41 mm. Venous: The inferior vena cava is normal in size with greater than 50% respiratory variability, suggesting right atrial pressure of 3 mmHg. IAS/Shunts: No atrial level shunt detected by color flow Doppler.  LEFT VENTRICLE PLAX 2D LVIDd:         4.50 cm   Diastology LVIDs:         2.50 cm   LV e' medial:    6.20 cm/s LV PW:         0.90 cm   LV E/e' medial:  12.0 LV IVS:        1.00 cm   LV e' lateral:   9.14 cm/s LVOT diam:     1.90 cm   LV E/e' lateral: 8.1 LV SV:         72 LV SV Index:   33 LVOT Area:     2.84 cm  RIGHT VENTRICLE RV S prime:     16.50 cm/s TAPSE (M-mode): 2.7 cm LEFT ATRIUM             Index        RIGHT ATRIUM           Index LA diam:        3.80 cm 1.77 cm/m   RA Area:     14.30 cm LA Vol (A2C):   41.0 ml 19.08 ml/m  RA Volume:   34.30 ml  15.96 ml/m LA Vol (A4C):   53.0 ml 24.67 ml/m LA Biplane Vol: 48.1 ml 22.39 ml/m  AORTIC VALVE LVOT Vmax:   138.00 cm/s LVOT Vmean:  90.300 cm/s LVOT VTI:    0.253 m AI PHT:      427 msec  AORTA Ao Root diam: 3.60 cm Ao Asc diam:  4.20 cm MITRAL VALVE MV Area (PHT): 3.77 cm     SHUNTS MV Decel Time: 201 msec     Systemic VTI:  0.25 m MV E velocity: 74.40 cm/s   Systemic Diam: 1.90 cm MV A velocity: 124.00 cm/s MV E/A ratio:  0.60 Kardie Tobb DO Electronically signed by Berniece Salines DO Signature Date/Time: 02/06/2022/12:33:35 PM    Final    CT Head Wo Contrast  Result Date: 02/05/2022 CLINICAL DATA:  Minor head trauma.  Loss of consciousness. EXAM: CT HEAD WITHOUT CONTRAST TECHNIQUE: Contiguous axial images were obtained from the base of the skull through the vertex without intravenous contrast. RADIATION DOSE REDUCTION: This exam was performed according to the departmental dose-optimization  program which includes  automated exposure control, adjustment of the mA and/or kV according to patient size and/or use of iterative reconstruction technique. COMPARISON:  MRI brain 02/14/2020 FINDINGS: Brain: The ventricles are normal in size and configuration. The basilar cisterns are patent. No mass, mass effect, or midline shift. No acute intracranial hemorrhage is seen. No abnormal extra-axial fluid collection. Minimal periventricular white matter hypodensities, likely chronic ischemic white matter changes and similar to the FLAIR signal on prior MRI. Preservation of the normal cortical gray-white interface without CT evidence of an acute major vascular territorial cortical based infarction. Vascular: No hyperdense vessel or unexpected calcification. Skull: Normal. Negative for fracture or focal lesion. Sinuses/Orbits: The visualized orbits are unremarkable. The visualized paranasal sinuses and mastoid air cells are clear. Other: None. IMPRESSION: 1. No acute intracranial hemorrhage or calvarial fracture. 2. Minimal periventricular white matter hypodensities, likely chronic ischemic white matter changes. Electronically Signed   By: Yvonne Kendall M.D.   On: 02/05/2022 19:42   CT Angio Chest Pulmonary Embolism (PE) W or WO Contrast  Result Date: 02/05/2022 CLINICAL DATA:  Chest pain and shortness of breath, loss of consciousness EXAM: CT ANGIOGRAPHY CHEST WITH CONTRAST TECHNIQUE: Multidetector CT imaging of the chest was performed using the standard protocol during bolus administration of intravenous contrast. Multiplanar CT image reconstructions and MIPs were obtained to evaluate the vascular anatomy. RADIATION DOSE REDUCTION: This exam was performed according to the departmental dose-optimization program which includes automated exposure control, adjustment of the mA and/or kV according to patient size and/or use of iterative reconstruction technique. CONTRAST:  58m OMNIPAQUE IOHEXOL 350 MG/ML SOLN COMPARISON:  08/29/2021  FINDINGS: Cardiovascular: This is a technically adequate evaluation of the pulmonary vasculature. No filling defects or pulmonary emboli. The heart is unremarkable without pericardial effusion. 4.6 cm ascending thoracic aortic aneurysm. No evidence of dissection. Mediastinum/Nodes: No enlarged mediastinal, hilar, or axillary lymph nodes. Thyroid gland, trachea, and esophagus demonstrate no significant findings. Stable hiatal hernia. Lungs/Pleura: No acute airspace disease, effusion, or pneumothorax. Subsegmental atelectasis at the lung bases. Central airways are patent. The sub 5 mm right middle and right lower lobe pulmonary nodules reported on prior study are not well visualized on this exam. There are no new pulmonary nodules or masses. Upper Abdomen: No acute abnormality. Musculoskeletal: No acute or destructive bony lesions. Reconstructed images demonstrate no additional findings. Review of the MIP images confirms the above findings. IMPRESSION: 1. No evidence of pulmonary embolus. 2. 4.6 cm ascending thoracic aortic aneurysm, previously measuring 4.4 cm. No evidence of dissection. Recommend semi-annual imaging followup by CTA or MRA and referral to cardiothoracic surgery if not already obtained. This recommendation follows 2010 ACCF/AHA/AATS/ACR/ASA/SCA/SCAI/SIR/STS/SVM Guidelines for the Diagnosis and Management of Patients With Thoracic Aortic Disease. Circulation. 2010; 121:: [id number] Aortic aneurysm NOS (ICD10-I71.9) 3. No acute intrathoracic process. 4. Stable hiatal hernia. Electronically Signed   By: MRanda NgoM.D.   On: 02/05/2022 19:40   DG Chest 2 View  Result Date: 02/05/2022 CLINICAL DATA:  Chest pain and syncope EXAM: CHEST - 2 VIEW COMPARISON:  01/25/2021 FINDINGS: Heart size is normal. Chronic aortic tortuosity. Chronic scarring at the lung bases. No consolidation or lobar collapse. No effusion. No significant bone finding. IMPRESSION: No active disease. Chronic scarring at the lung  bases. Aortic tortuosity. Electronically Signed   By: MNelson ChimesM.D.   On: 02/05/2022 13:38    Micro Results    No results found for this or any previous visit (from the past 240 hour(s)).  Today  Subjective    Joe Arroyo feels much improved since admission.  Feels ready to go home.  Denies chest pain, shortness of breath or abdominal pain.  Feels they can take care of themselves with the resources they have at home.  Objective   Blood pressure (!) 143/92, pulse 79, temperature 99.2 F (37.3 C), temperature source Oral, resp. rate 19, SpO2 100 %.   Intake/Output Summary (Last 24 hours) at 02/06/2022 1826 Last data filed at 02/06/2022 0113 Gross per 24 hour  Intake 11.54 ml  Output --  Net 11.54 ml    Exam General: Patient appears well and in good spirits sitting up in bed in no acute distress.  Eyes: sclera anicteric, conjuctiva mild injection bilaterally CVS: S1-S2, regular  Respiratory:  decreased air entry bilaterally secondary to decreased inspiratory effort, rales at bases  GI: NABS, soft, NT  LE: No edema.  Neuro: A/O x 3, Moving all extremities equally with normal strength, CN 3-12 intact, grossly nonfocal.  Psych: patient is logical and coherent, judgement and insight appear normal, mood and affect appropriate to situation.    Data Review   CBC w Diff:  Lab Results  Component Value Date   WBC 11.6 (H) 02/06/2022   HGB 13.4 02/06/2022   HGB 14.6 04/17/2021   HCT 40.7 02/06/2022   HCT 44.5 04/17/2021   PLT 210 02/06/2022   PLT 239 04/17/2021   LYMPHOPCT 14.6 06/04/2020   MONOPCT 12.0 06/04/2020   EOSPCT 1.7 06/04/2020   BASOPCT 0.3 06/04/2020    CMP:  Lab Results  Component Value Date   NA 138 02/06/2022   NA 141 11/14/2021   K 4.0 02/06/2022   CL 104 02/06/2022   CO2 22 02/06/2022   BUN 10 02/06/2022   BUN 12 11/14/2021   CREATININE 1.09 02/06/2022   PROT 7.3 04/17/2021   ALBUMIN 4.7 04/17/2021   BILITOT 0.5 04/17/2021   ALKPHOS 137  (H) 04/17/2021   AST 38 04/17/2021   ALT 32 04/17/2021  .   Total Time in preparing paper work, data evaluation and todays exam - 35 minutes  Vashti Hey M.D on 02/06/2022 at 6:26 PM  [facility]

## 2022-02-06 NOTE — ED Notes (Addendum)
Pt transported to echo 

## 2022-02-06 NOTE — Progress Notes (Signed)
      Joe Arroyo presented for a nuclear stress test today.  He had some chest discomfort that was similar to the past 2 years, that was slightly worsened, but felt not severe enough to need Nitro or Morphine. VS had remained stable. No immediate complications.  Stress imaging is pending at this time.  Preliminary EKG findings may be listed in the chart, but the stress test result will not be finalized until perfusion imaging is complete.  1 day study, 1 to read.  Margie Billet, AGNP-BC 02/06/2022, 3:24 PM

## 2022-02-06 NOTE — Progress Notes (Deleted)
PROGRESS NOTE    Joe Arroyo  UJW:[id number]  DOB: [birth year]  DOA: 02/05/2022 PCP: [name], MD Outpatient Specialists:   Hospital course:  66 year old with CAD s/p recent DES 4 months ago, OSA was admitted yesterday for pressure-like chest pain occurring at rest and exertion.  Work-up included CTA which was negative for PE.  ROS is significant for syncope 2 days TIA.  Subjective:  Patient states that prior to his syncope he felt "a floating sensation and I was telling myself do not fall on your face, do not fall on your face" prior to falling.  Patient states he did have LOC but was not confused when he woke up.  He knew where he was and he knew that he had fainted but did not know why.  Patient denies any associated chest pain prior to syncope.    Objective: Vitals:   02/06/22 0745 02/06/22 0900 02/06/22 1030 02/06/22 1100  BP: 112/80 125/78 139/85 (!) 144/96  Pulse: 87 71 77 97  Resp: '20 18 17 '$ (!) 24  Temp:    99.2 F (37.3 C)  TempSrc:    Oral  SpO2: 97% 99% 100% 97%    Intake/Output Summary (Last 24 hours) at 02/06/2022 1320 Last data filed at 02/06/2022 0113 Gross per 24 hour  Intake 11.54 ml  Output --  Net 11.54 ml   There were no vitals filed for this visit.   Exam:  General: Patient lying in bed with attentive father at bedside in NAD Eyes: sclera anicteric, conjuctiva mild injection bilaterally CVS: S1-S2, regular  Respiratory:  decreased air entry bilaterally secondary to decreased inspiratory effort, rales at bases  GI: NABS, soft, NT  LE: No edema.  Neuro: A/O x 3, Moving all extremities equally with normal strength, CN 3-12 intact, grossly nonfocal.  Psych: patient is logical and coherent, judgement and insight appear normal, mood and affect appropriate to situation.   Assessment & Plan:   Chest pain in patient with recent DES CTA done yesterday was negative for any pulmonary embolus. Patient was seen by cardiology today who are  recommending stress test. Echocardiogram done today shows normal EF 65% with no RWMA, RV function is also normal.  He does have mild to moderate AR with dilation of aortic root at 41 mm.  Thoracic aneurysm seen CT is stable. Patient is presently chest pain free. Of note patient is not on a beta-blocker on home meds, can consider starting 1 instead of clonidine or amlodipine for cardioprotection. Continue aspirin, Plavix and rosuvastatin  Syncope Suspect this is more likely vasovagal syncope given prodrome and his admonition to himself that he should not hit his face. I have requested orthostatic vital signs Echocardiogram without any AS, however s he does have AR It is also possible that he is being overtreated for hypertension Of note, both clonidine and tamsulosin can be associated with syncope.  Can consider changing clonidine to beta-blocker prior to discharge if warranted.  HTN As noted above, can consider changing clonidine to beta-blocker upon discharge Continue amlodipine  BPH Continue tamsulosin  OSA And urged to start using CPAP   DVT prophylaxis: Lovenox Code Status: DNR Family Communication: Father was at bedside throughout Disposition Plan:   Patient is from: Home  Anticipated Discharge Location: Home  Barriers to Discharge: Awaiting stress test  Is patient medically stable for Discharge: After stress test   Scheduled Meds:  aspirin EC  81 mg Oral Daily   clopidogrel  75 mg Oral Daily   [  START ON 02/07/2022] enoxaparin (LOVENOX) injection  40 mg Subcutaneous Q24H   ezetimibe  10 mg Oral Daily   rosuvastatin  40 mg Oral Daily   sodium chloride flush  3 mL Intravenous Q12H   Continuous Infusions:  Data Reviewed:  Basic Metabolic Panel: Recent Labs  Lab 02/05/22 1311 02/06/22 0340  NA 138 138  K 3.8 4.0  CL 103 104  CO2 21* 22  GLUCOSE 91 101*  BUN 11 10  CREATININE 1.27* 1.09  CALCIUM 9.7 9.2    CBC: Recent Labs  Lab 02/05/22 1311  02/06/22 0333  WBC 8.7 11.6*  HGB 14.9 13.4  HCT 44.4 40.7  MCV 83.6 87.0  PLT 263 210    Studies: ECHOCARDIOGRAM COMPLETE  Result Date: 02/06/2022    ECHOCARDIOGRAM REPORT   Patient Name:   Joe Arroyo Date of Exam: 02/06/2022 Medical Rec #:  [id number]     Height:       72.0 in Accession #:    [id number]    Weight:       204.0 lb Date of Birth:  01/02/1956     BSA:          2.149 m Patient Age:    66 years      BP:           139/85 mmHg Patient Gender: M             HR:           84 bpm. Exam Location:  Inpatient Procedure: 2D Echo Indications:    Chest pain. syncope  History:        Patient has prior history of Echocardiogram examinations, most                 recent 09/24/2021. CAD, Abnormal ECG, Signs/Symptoms:Chest Pain                 and Syncope; Risk Factors:Hypertension, Dyslipidemia and Sleep                 Apnea.  Sonographer:    Johny Chess RDCS Referring Phys: [id number] [place]  Sonographer Comments: Suboptimal subcostal window. IMPRESSIONS  1. Left ventricular ejection fraction, by estimation, is 60 to 65%. The left ventricle has normal function. The left ventricle has no regional wall motion abnormalities. Left ventricular diastolic parameters are indeterminate.  2. Right ventricular systolic function is normal. The right ventricular size is normal. Tricuspid regurgitation signal is inadequate for assessing PA pressure.  3. The mitral valve is normal in structure. Trivial mitral valve regurgitation. No evidence of mitral stenosis.  4. The aortic valve is normal in structure. Aortic valve regurgitation is mild to moderate. No aortic stenosis is present.  5. There is dilatation of the ascending aorta, measuring 41 mm.  6. The inferior vena cava is normal in size with greater than 50% respiratory variability, suggesting right atrial pressure of 3 mmHg. FINDINGS  Left Ventricle: Left ventricular ejection fraction, by estimation, is 60 to 65%. The left ventricle has normal  function. The left ventricle has no regional wall motion abnormalities. The left ventricular internal cavity size was normal in size. There is  no left ventricular hypertrophy. Left ventricular diastolic parameters are indeterminate. Right Ventricle: The right ventricular size is normal. No increase in right ventricular wall thickness. Right ventricular systolic function is normal. Tricuspid regurgitation signal is inadequate for assessing PA pressure. Left Atrium: Left atrial size was normal in size. Right Atrium: Right atrial size  was normal in size. Pericardium: There is no evidence of pericardial effusion. Presence of epicardial fat layer. Mitral Valve: The mitral valve is normal in structure. Trivial mitral valve regurgitation. No evidence of mitral valve stenosis. Tricuspid Valve: The tricuspid valve is normal in structure. Tricuspid valve regurgitation is trivial. No evidence of tricuspid stenosis. Aortic Valve: The aortic valve is normal in structure. Aortic valve regurgitation is mild to moderate. Aortic regurgitation PHT measures 427 msec. No aortic stenosis is present. Pulmonic Valve: The pulmonic valve was normal in structure. Pulmonic valve regurgitation is not visualized. No evidence of pulmonic stenosis. Aorta: There is dilatation of the ascending aorta, measuring 41 mm. Venous: The inferior vena cava is normal in size with greater than 50% respiratory variability, suggesting right atrial pressure of 3 mmHg. IAS/Shunts: No atrial level shunt detected by color flow Doppler.  LEFT VENTRICLE PLAX 2D LVIDd:         4.50 cm   Diastology LVIDs:         2.50 cm   LV e' medial:    6.20 cm/s LV PW:         0.90 cm   LV E/e' medial:  12.0 LV IVS:        1.00 cm   LV e' lateral:   9.14 cm/s LVOT diam:     1.90 cm   LV E/e' lateral: 8.1 LV SV:         72 LV SV Index:   33 LVOT Area:     2.84 cm  RIGHT VENTRICLE RV S prime:     16.50 cm/s TAPSE (M-mode): 2.7 cm LEFT ATRIUM             Index        RIGHT ATRIUM            Index LA diam:        3.80 cm 1.77 cm/m   RA Area:     14.30 cm LA Vol (A2C):   41.0 ml 19.08 ml/m  RA Volume:   34.30 ml  15.96 ml/m LA Vol (A4C):   53.0 ml 24.67 ml/m LA Biplane Vol: 48.1 ml 22.39 ml/m  AORTIC VALVE LVOT Vmax:   138.00 cm/s LVOT Vmean:  90.300 cm/s LVOT VTI:    0.253 m AI PHT:      427 msec  AORTA Ao Root diam: 3.60 cm Ao Asc diam:  4.20 cm MITRAL VALVE MV Area (PHT): 3.77 cm     SHUNTS MV Decel Time: 201 msec     Systemic VTI:  0.25 m MV E velocity: 74.40 cm/s   Systemic Diam: 1.90 cm MV A velocity: 124.00 cm/s MV E/A ratio:  0.60 Kardie Tobb DO Electronically signed by Berniece Salines DO Signature Date/Time: 02/06/2022/12:33:35 PM    Final    CT Head Wo Contrast  Result Date: 02/05/2022 CLINICAL DATA:  Minor head trauma.  Loss of consciousness. EXAM: CT HEAD WITHOUT CONTRAST TECHNIQUE: Contiguous axial images were obtained from the base of the skull through the vertex without intravenous contrast. RADIATION DOSE REDUCTION: This exam was performed according to the departmental dose-optimization program which includes automated exposure control, adjustment of the mA and/or kV according to patient size and/or use of iterative reconstruction technique. COMPARISON:  MRI brain 02/14/2020 FINDINGS: Brain: The ventricles are normal in size and configuration. The basilar cisterns are patent. No mass, mass effect, or midline shift. No acute intracranial hemorrhage is seen. No abnormal extra-axial fluid collection. Minimal periventricular white matter  hypodensities, likely chronic ischemic white matter changes and similar to the FLAIR signal on prior MRI. Preservation of the normal cortical gray-white interface without CT evidence of an acute major vascular territorial cortical based infarction. Vascular: No hyperdense vessel or unexpected calcification. Skull: Normal. Negative for fracture or focal lesion. Sinuses/Orbits: The visualized orbits are unremarkable. The visualized paranasal  sinuses and mastoid air cells are clear. Other: None. IMPRESSION: 1. No acute intracranial hemorrhage or calvarial fracture. 2. Minimal periventricular white matter hypodensities, likely chronic ischemic white matter changes. Electronically Signed   By: Yvonne Kendall M.D.   On: 02/05/2022 19:42   CT Angio Chest Pulmonary Embolism (PE) W or WO Contrast  Result Date: 02/05/2022 CLINICAL DATA:  Chest pain and shortness of breath, loss of consciousness EXAM: CT ANGIOGRAPHY CHEST WITH CONTRAST TECHNIQUE: Multidetector CT imaging of the chest was performed using the standard protocol during bolus administration of intravenous contrast. Multiplanar CT image reconstructions and MIPs were obtained to evaluate the vascular anatomy. RADIATION DOSE REDUCTION: This exam was performed according to the departmental dose-optimization program which includes automated exposure control, adjustment of the mA and/or kV according to patient size and/or use of iterative reconstruction technique. CONTRAST:  57m OMNIPAQUE IOHEXOL 350 MG/ML SOLN COMPARISON:  08/29/2021 FINDINGS: Cardiovascular: This is a technically adequate evaluation of the pulmonary vasculature. No filling defects or pulmonary emboli. The heart is unremarkable without pericardial effusion. 4.6 cm ascending thoracic aortic aneurysm. No evidence of dissection. Mediastinum/Nodes: No enlarged mediastinal, hilar, or axillary lymph nodes. Thyroid gland, trachea, and esophagus demonstrate no significant findings. Stable hiatal hernia. Lungs/Pleura: No acute airspace disease, effusion, or pneumothorax. Subsegmental atelectasis at the lung bases. Central airways are patent. The sub 5 mm right middle and right lower lobe pulmonary nodules reported on prior study are not well visualized on this exam. There are no new pulmonary nodules or masses. Upper Abdomen: No acute abnormality. Musculoskeletal: No acute or destructive bony lesions. Reconstructed images demonstrate no  additional findings. Review of the MIP images confirms the above findings. IMPRESSION: 1. No evidence of pulmonary embolus. 2. 4.6 cm ascending thoracic aortic aneurysm, previously measuring 4.4 cm. No evidence of dissection. Recommend semi-annual imaging followup by CTA or MRA and referral to cardiothoracic surgery if not already obtained. This recommendation follows 2010 ACCF/AHA/AATS/ACR/ASA/SCA/SCAI/SIR/STS/SVM Guidelines for the Diagnosis and Management of Patients With Thoracic Aortic Disease. Circulation. 2010; 121:: [id number] Aortic aneurysm NOS (ICD10-I71.9) 3. No acute intrathoracic process. 4. Stable hiatal hernia. Electronically Signed   By: MRanda NgoM.D.   On: 02/05/2022 19:40   DG Chest 2 View  Result Date: 02/05/2022 CLINICAL DATA:  Chest pain and syncope EXAM: CHEST - 2 VIEW COMPARISON:  01/25/2021 FINDINGS: Heart size is normal. Chronic aortic tortuosity. Chronic scarring at the lung bases. No consolidation or lobar collapse. No effusion. No significant bone finding. IMPRESSION: No active disease. Chronic scarring at the lung bases. Aortic tortuosity. Electronically Signed   By: MNelson ChimesM.D.   On: 02/05/2022 13:38    Principal Problem:   Unstable angina ([facility] Active Problems:   Coronary artery disease   Syncope   Essential hypertension, benign   Hyperlipidemia   OSA (obstructive sleep apnea)   Thoracic ascending [place]     Syriah Delisi TDerek Jack [facility]  If 7PM-7AM, please contact night-coverage [email or web address]   LOS: 0 days

## 2022-02-06 NOTE — Progress Notes (Signed)
Tolerated Stress test with min.  discomfort

## 2022-02-06 NOTE — Progress Notes (Signed)
  Echocardiogram 2D Echocardiogram has been performed.  Johny Chess 02/06/2022, 11:52 AM

## 2022-02-06 NOTE — Progress Notes (Signed)
Zio monitor ordered per Dr Debara Pickett request

## 2022-02-06 NOTE — Progress Notes (Unsigned)
Enrolled for [employer] to mail a ZIO XT long term holter monitor to the patients address on file.  

## 2022-02-07 ENCOUNTER — Telehealth: Payer: Self-pay | Admitting: Cardiology

## 2022-02-07 LAB — NM MYOCAR MULTI W/SPECT W/WALL MOTION / EF
Base ST Depression (mm): 0 mm
Estimated workload: 1
Exercise duration (min): 7 min
MPHR: 152 {beats}/min
Peak HR: 114 {beats}/min
Percent HR: 75 %
Rest HR: 83 {beats}/min
ST Depression (mm): 0 mm

## 2022-02-07 NOTE — Telephone Encounter (Signed)
LVM for pt to call and schedule appt with Dr. Marc Morgans 02/07/22

## 2022-02-07 NOTE — Telephone Encounter (Signed)
-----   Message from Margie Billet, NP sent at 02/06/2022  5:02 PM EDT ----- Regarding: appt Please arrange follow up in 2 weeks for him with Dr Bettina Gavia thanks

## 2022-02-09 NOTE — Progress Notes (Unsigned)
Cardiology Office Note:    Date:  02/10/2022   ID:  Joe Arroyo, DOB Dec 14, 1955, MRN [id number]  PCP:  Joe Brome, MD  Cardiologist:  Joe More, MD    Referring MD: Joe Brome, MD    ASSESSMENT:    1. Syncope and collapse   2. Coronary artery disease involving native coronary artery of native heart with other form of angina pectoris ([place])   3. Resistant hypertension   4. Aneurysm of ascending aorta without rupture ([place])   5. Nonrheumatic aortic valve insufficiency   6. Mixed hyperlipidemia    PLAN:    In order of problems listed above:  In retrospect his episode was due to tamsulosin he will stop..  I will ask him to check his blood pressure at home record sitting and standing daily Stable CAD no recent acute coronary syndrome and perfusion study normal If anything I am concerned about symptomatic orthostatic hypotension I checked his blood pressure 158/70 sitting 128/80 standing.  We will stop his alpha-blocker and I am also can ask him to stop taking amlodipine. Stable aneurysm and aortic regurgitation Once recovered I think he should have a cardiopulmonary exercise test to sort out of his shortness of breath is cardiac pulmonary or other mechanism symptoms like deconditioning.  I will go ahead and schedule it for 4 weeks from now   Next appointment: 6 weeks   Medication Adjustments/Labs and Tests Ordered: Current medicines are reviewed at length with the patient today.  Concerns regarding medicines are outlined above.  No orders of the defined types were placed in this encounter.  No orders of the defined types were placed in this encounter.   Chief Complaint  Patient presents with   Follow-up   Loss of Consciousness    History of Present Illness:    Joe Arroyo is a 66 y.o. male with a hx of CAD with PCI and stent posterior lateral branch of right coronary artery 10/07/2021, resistant hypertension ascending aortic aneurysm and obstructive sleep apnea  last seen 10/29/2021.  Despite resolution of angina he continues to have exertional shortness of breath.  Initially underwent a right heart catheterization that was normal except for mildly reduced cardiac index.   He was seen by pulmonary in January 2023.  Compliance with diet, lifestyle and medications: Yes  He started taking an alpha-blocker tamsulosin a few days before he fainted In my office today he has about a 40 mm drop in blood pressure when he stands He is had acute respiratory symptoms headache fever to 100.1 GI symptoms and thinks he may have influenza.   He was admitted to [facility] 1025 discharged 1026 after a syncopal episode and with persistent nonanginal chest pain.  Serial cardiac enzymes were normal no relief with nitroglycerin CTA of the chest showed no pulmonary embolism or acute aortopathy but did show 4.6 cm ascending thoracic aortic aneurysm stable. He underwent a myocardial perfusion study performed 02/06/2022 showing EF of 65% normal LV function no ischemia or pattern of infarction.  It was normal. He underwent echocardiogram 02/06/2022 left ventricle normal in size systolic function EF 60 to 65% normal right ventricular size and function. Visualized ascending aorta is mildly enlarged 41 mm.  Mild to moderate aortic regurgitation pressure half-time 427. Past Medical History:  Diagnosis Date   Atypical chest pain 01/07/2021   Chronic idiopathic constipation 01/07/2021   Cough 09/05/2019   Dyspnea    Essential hypertension, benign 05/14/2018   GERD (gastroesophageal reflux disease) 09/28/2020  Hiatal hernia 09/28/2020   History of hiatal hernia    History of kidney stones    Hyperlipidemia 05/14/2018   Malaise 09/05/2019   Non-seasonal allergic rhinitis due to pollen 08/16/2019   OSA (obstructive sleep apnea) 09/28/2020   Other headache syndrome 08/16/2019   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   Shortness of breath 09/05/2019   Sinus bradycardia 05/14/2018    Syncope 09/05/2019   Thoracic ascending aortic aneurysm (HCC)    ascending TAA 4.6 x 4.4 cm 10/03/20 CTA chest    Past Surgical History:  Procedure Laterality Date   COLONOSCOPY     CORONARY STENT INTERVENTION N/A 10/07/2021   Procedure: CORONARY STENT INTERVENTION;  Surgeon: Joe Man, MD;  Location: [place] [facility];  Service: Cardiovascular;  Laterality: N/A;   ESOPHAGOGASTRODUODENOSCOPY N/A 12/05/2020   Procedure: ESOPHAGOGASTRODUODENOSCOPY (EGD);  Surgeon: Joe Matte, MD;  Location: [facility] OR;  Service: Thoracic;  Laterality: N/A;   LAMINECTOMY     MOLE REMOVAL     PROSTATE BIOPSY     RIGHT/LEFT HEART CATH AND CORONARY ANGIOGRAPHY N/A 10/07/2021   Procedure: RIGHT/LEFT HEART CATH AND CORONARY ANGIOGRAPHY;  Surgeon: Joe Man, MD;  Location: [place] [facility];  Service: Cardiovascular;  Laterality: N/A;   TONSILLECTOMY AND ADENOIDECTOMY     UPPER GASTROINTESTINAL ENDOSCOPY     XI ROBOTIC ASSISTED PARAESOPHAGEAL HERNIA REPAIR N/A 12/05/2020   Procedure: XI ROBOTIC ASSISTED PARAESOPHAGEAL HERNIA REPAIR AND FUNDOPLICATION;  Surgeon: Joe Matte, MD;  Location: [facility];  Service: Thoracic;  Laterality: N/A;    Current Medications: Current Meds  Medication Sig   amLODipine (NORVASC) 5 MG tablet Take 1 tablet (5 mg total) by mouth daily.   aspirin EC 81 MG tablet Take 1 tablet (81 mg total) by mouth daily. Swallow whole.   cloNIDine (CATAPRES) 0.3 MG tablet Take 1 tablet (0.3 mg total) by mouth daily.   clopidogrel (PLAVIX) 75 MG tablet Take 1 tablet (75 mg total) by mouth daily.   ezetimibe (ZETIA) 10 MG tablet Take 1 tablet (10 mg total) by mouth daily.   finasteride (PROSCAR) 5 MG tablet Take 5 mg by mouth daily.   fluticasone-salmeterol (ADVAIR HFA) 230-21 MCG/ACT inhaler Inhale 2 puffs into the lungs 2 (two) times daily.   furosemide (LASIX) 40 MG tablet Take 1 tablet (40 mg total) by mouth 2 (two) times daily.   ibuprofen (ADVIL) 200 MG tablet Take 400 mg  by mouth every 6 (six) hours as needed for headache.   meloxicam (MOBIC) 15 MG tablet Take 1 tablet (15 mg total) by mouth daily as needed. (Patient taking differently: Take 15 mg by mouth daily as needed for pain.)   Multiple Vitamins-Minerals (CENTRUM SILVER ULTRA MENS PO) Take 1 tablet by mouth daily.   Omega-3 Fatty Acids (FISH OIL PO) Take 1 tablet by mouth 2 (two) times daily.   pantoprazole (PROTONIX) 40 MG tablet    rosuvastatin (CRESTOR) 40 MG tablet Take 1 tablet (40 mg total) by mouth daily.   tamsulosin (FLOMAX) 0.4 MG CAPS capsule Take 0.4 mg by mouth daily.     Allergies:   Nitroglycerin, Beta adrenergic blockers, Bystolic [nebivolol hcl], Carvedilol, Pravastatin, Hctz [hydrochlorothiazide], Hydralazine, and Penicillins   Social History   Socioeconomic History   Marital status: Single    Spouse name: Not on file   Number of children: Not on file   Years of education: Not on file   Highest education level: Not on file  Occupational History   Not  on file  Tobacco Use   Smoking status: Never   Smokeless tobacco: Never  Vaping Use   Vaping Use: Never used  Substance and Sexual Activity   Alcohol use: Yes    Comment: occasional   Drug use: Never   Sexual activity: Not on file  Other Topics Concern   Not on file  Social History Narrative   Not on file   Social Determinants of Health   Financial Resource Strain: Low Risk  (09/12/2021)   Overall Financial Resource Strain (CARDIA)    Difficulty of Paying Living Expenses: Not hard at all  Food Insecurity: No Food Insecurity (02/06/2022)   Hunger Vital Sign    Worried About Running Out of Food in the Last Year: Never true    Ran Out of Food in the Last Year: Never true  Transportation Needs: No Transportation Needs (09/12/2021)   PRAPARE - Hydrologist (Medical): No    Lack of Transportation (Non-Medical): No  Physical Activity: Unknown (09/12/2021)   Exercise Vital Sign    Days of Exercise  per Week: 0 days    Minutes of Exercise per Session: Not on file  Stress: No Stress Concern Present (09/12/2021)   [place]    Feeling of Stress : Only a little  Social Connections: Socially Isolated (09/12/2021)   Social Connection and Isolation Panel [NHANES]    Frequency of Communication with Friends and Family: Ar[place]o than three times a week    Frequency of Social Gatherings with Friends and Family: Once a week    Attends Religious Services: Never    Marine scientist or Organizations: No    Attends Music therapist: Never    Marital Status: Never married     Family History: The patient's family history includes Alzheimer's disease in his father; Aortic stenosis in his mother; CAD in his maternal grandmother; Cancer in his mother; Cancer - Colon in his mother; Cerebrovascular Accident in his mother; Colon cancer in his mother; Congestive Heart Failure in his maternal grandmother; Coronary artery disease in his maternal grandmother; Diabetes in his father and mother; Diabetes Mellitus II in his father and mother; Esophageal cancer in his paternal uncle; Heart attack in his maternal grandmother; Heart failure in his maternal grandmother; Hyperlipidemia in his father, maternal grandmother, and mother; Hypertension in his mother; Stroke in his mother; Valvular heart disease in his mother. ROS:   Please see the history of present illness.    All other systems reviewed and are negative.  EKGs/Labs/Other Studies Reviewed:    The following studies were reviewed today:  EKG:  EKG ordered today and personally reviewed.  The ekg ordered today demonstrates sinus tachycardia right atrial enlargement pulmonary disease pattern  Recent Labs: 04/17/2021: ALT 32 11/14/2021: NT-Pro BNP 67 02/06/2022: BUN 10; Creatinine, Ser 1.09; Hemoglobin 13.4; Platelets 210; Potassium 4.0; Sodium 138  Recent Lipid Panel    Component  Value Date/Time   CHOL 141 04/17/2021 0944   TRIG 99 04/17/2021 0944   HDL 57 04/17/2021 0944   CHOLHDL 2.5 04/17/2021 0944   LDLCALC 66 04/17/2021 0944    Physical Exam:    VS:  BP (!) 129/91 (BP Location: Right Arm, Patient Position: Sitting, Cuff Size: Normal)   Pulse (!) 118   Ht 6' (1.829 m)   Wt 196 lb 6.4 oz (89.1 kg)   SpO2 96%   BMI 26.64 kg/m     Wt  Readings from Last 3 Encounters:  02/10/22 196 lb 6.4 oz (89.1 kg)  10/29/21 204 lb (92.5 kg)  10/07/21 195 lb (88.5 kg)     GEN:  Well nourished, well developed in no acute distress HEENT: Normal NECK: No JVD; No carotid bruits LYMPHATICS: No lymphadenopathy CARDIAC: RRR, no murmurs, rubs, gallops RESPIRATORY:  Clear to auscultation without rales, wheezing or rhonchi  ABDOMEN: Soft, non-tender, non-distended MUSCULOSKELETAL:  No edema; No deformity  SKIN: Warm and dry NEUROLOGIC:  Alert and oriented x 3 PSYCHIATRIC:  Normal affect    Signed, Joe More, MD  02/10/2022 3:28 PM     [facility]

## 2022-02-10 ENCOUNTER — Encounter: Payer: Self-pay | Admitting: Cardiology

## 2022-02-10 ENCOUNTER — Ambulatory Visit: Payer: Medicare HMO | Attending: Cardiology | Admitting: Cardiology

## 2022-02-10 VITALS — BP 129/91 | HR 118 | Ht 72.0 in | Wt 196.4 lb

## 2022-02-10 DIAGNOSIS — I351 Nonrheumatic aortic (valve) insufficiency: Secondary | ICD-10-CM

## 2022-02-10 DIAGNOSIS — E782 Mixed hyperlipidemia: Secondary | ICD-10-CM | POA: Diagnosis not present

## 2022-02-10 DIAGNOSIS — I7121 Aneurysm of the ascending aorta, without rupture: Secondary | ICD-10-CM

## 2022-02-10 DIAGNOSIS — I1A0 Resistant hypertension: Secondary | ICD-10-CM

## 2022-02-10 DIAGNOSIS — R059 Cough, unspecified: Secondary | ICD-10-CM | POA: Diagnosis not present

## 2022-02-10 DIAGNOSIS — R0981 Nasal congestion: Secondary | ICD-10-CM | POA: Diagnosis not present

## 2022-02-10 DIAGNOSIS — I25118 Atherosclerotic heart disease of native coronary artery with other forms of angina pectoris: Secondary | ICD-10-CM | POA: Diagnosis not present

## 2022-02-10 DIAGNOSIS — R55 Syncope and collapse: Secondary | ICD-10-CM

## 2022-02-10 NOTE — Addendum Note (Signed)
Addended by: Jerl Santos R on: 02/10/2022 03:41 PM   Modules accepted: Orders

## 2022-02-10 NOTE — Patient Instructions (Signed)
Medication Instructions:  Your physician has recommended you make the following change in your medication:  Stop Tamsulosin Stop Amlodipine  *If you need a refill on your cardiac medications before your next appointment, please call your pharmacy*   Lab Work: NONE If you have labs (blood work) drawn today and your tests are completely normal, you will receive your results only by: [place] (if you have MyChart) OR A paper copy in the mail If you have any lab test that is abnormal or we need to change your treatment, we will call you to review the results.   Testing/Procedures: Cardio Pulmonary Exercise Test   Follow-Up: At [facility], you and your health needs are our priority.  As part of our continuing mission to provide you with exceptional heart care, we have created designated Provider Care Teams.  These Care Teams include your primary Cardiologist (physician) and Advanced Practice Providers (APPs -  Physician Assistants and Nurse Practitioners) who all work together to provide you with the care you need, when you need it.  We recommend signing up for the patient portal called "MyChart".  Sign up information is provided on this After Visit Summary.  MyChart is used to connect with patients for Virtual Visits (Telemedicine).  Patients are able to view lab/test results, encounter notes, upcoming appointments, etc.  Non-urgent messages can be sent to your provider as well.   To learn more about what you can do with MyChart, go to [email or web address].    Your next appointment:   6 week(s)  The format for your next appointment:   In Person  Provider:   Shirlee More, MD    Other Instructions Check BP at home twice daily siting and standing and record results  Important Information About Sugar

## 2022-02-19 DIAGNOSIS — R55 Syncope and collapse: Secondary | ICD-10-CM | POA: Diagnosis not present

## 2022-03-10 DIAGNOSIS — R55 Syncope and collapse: Secondary | ICD-10-CM | POA: Diagnosis not present

## 2022-03-11 ENCOUNTER — Other Ambulatory Visit: Payer: Self-pay

## 2022-03-12 MED ORDER — CLONIDINE HCL 0.3 MG PO TABS: 0.3000 mg | ORAL_TABLET | Freq: Every day | ORAL | 1 refills | Status: DC

## 2022-03-17 ENCOUNTER — Ambulatory Visit (INDEPENDENT_AMBULATORY_CARE_PROVIDER_SITE_OTHER): Payer: Medicare HMO | Admitting: Family Medicine

## 2022-03-17 VITALS — BP 120/80 | HR 62 | Temp 97.1°F | Resp 16 | Ht 72.0 in | Wt 201.0 lb

## 2022-03-17 DIAGNOSIS — I119 Hypertensive heart disease without heart failure: Secondary | ICD-10-CM

## 2022-03-17 DIAGNOSIS — R0602 Shortness of breath: Secondary | ICD-10-CM | POA: Diagnosis not present

## 2022-03-17 DIAGNOSIS — I10 Essential (primary) hypertension: Secondary | ICD-10-CM

## 2022-03-17 DIAGNOSIS — I25110 Atherosclerotic heart disease of native coronary artery with unstable angina pectoris: Secondary | ICD-10-CM

## 2022-03-17 DIAGNOSIS — E782 Mixed hyperlipidemia: Secondary | ICD-10-CM

## 2022-03-17 DIAGNOSIS — R5383 Other fatigue: Secondary | ICD-10-CM

## 2022-03-17 DIAGNOSIS — Z23 Encounter for immunization: Secondary | ICD-10-CM | POA: Diagnosis not present

## 2022-03-17 DIAGNOSIS — K219 Gastro-esophageal reflux disease without esophagitis: Secondary | ICD-10-CM

## 2022-03-17 HISTORY — DX: Other fatigue: R53.83

## 2022-03-17 NOTE — Progress Notes (Unsigned)
Subjective:  Patient ID: Joe Arroyo, male    DOB: Sep 02, 1955  Age: 66 y.o. MRN: [id number]  No chief complaint on file.   HPI Patient has hypertension. Dr. Bettina Gavia stopped tamsulosin at visit on 02/10/2022 after being on it one week. The patient had passed out. Dr. Bettina Gavia also stopped amlodipine 5 mg once daily.  Keeps a low grade headache. Occasional chest pain. Dyspnea has gradually worsened. On advair 2 puffs twice daily and does not help.  ON lasix 40 mg twice daily.   Ranges of bp: sitting: 113-130/70-80s, standing incrased to 150s/70-80s CORONARY ARTERY DISEASE: on plavix, aspirin, zetia, fish oil 1 tablet twice daily.  BPH: on proscar. Tamsulosin held.  HYPERTENSION: on clonidine 0.3 mg once daily,      Current Outpatient Medications on File Prior to Visit  Medication Sig Dispense Refill   aspirin EC 81 MG tablet Take 1 tablet (81 mg total) by mouth daily. Swallow whole. 90 tablet 3   cloNIDine (CATAPRES) 0.3 MG tablet Take 1 tablet (0.3 mg total) by mouth daily. 90 tablet 1   clopidogrel (PLAVIX) 75 MG tablet Take 1 tablet (75 mg total) by mouth daily. 90 tablet 1   ezetimibe (ZETIA) 10 MG tablet Take 1 tablet (10 mg total) by mouth daily. 90 tablet 1   finasteride (PROSCAR) 5 MG tablet Take 5 mg by mouth daily.     fluticasone-salmeterol (ADVAIR HFA) 230-21 MCG/ACT inhaler Inhale 2 puffs into the lungs 2 (two) times daily. 1 each 3   furosemide (LASIX) 40 MG tablet Take 1 tablet (40 mg total) by mouth 2 (two) times daily. 180 tablet 3   ibuprofen (ADVIL) 200 MG tablet Take 400 mg by mouth every 6 (six) hours as needed for headache.     meloxicam (MOBIC) 15 MG tablet Take 1 tablet (15 mg total) by mouth daily as needed. (Patient taking differently: Take 15 mg by mouth daily as needed for pain.) 90 tablet 0   Multiple Vitamins-Minerals (CENTRUM SILVER ULTRA MENS PO) Take 1 tablet by mouth daily.     Omega-3 Fatty Acids (FISH OIL PO) Take 1 tablet by mouth 2 (two) times daily.      pantoprazole (PROTONIX) 40 MG tablet      rosuvastatin (CRESTOR) 40 MG tablet Take 1 tablet (40 mg total) by mouth daily. 90 tablet 0   No current facility-administered medications on file prior to visit.   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   Shortness of breath 09/05/2019   Sinus bradycardia 05/14/2018   Syncope 09/05/2019   Thoracic ascending aortic aneurysm (HCC)    ascending TAA 4.6 x 4.4 cm 10/03/20 CTA chest   Past Surgical History:  Procedure Laterality Date   COLONOSCOPY     CORONARY STENT INTERVENTION N/A 10/07/2021   Procedure: CORONARY STENT INTERVENTION;  Surgeon: Leonie Man, MD;  Location: [place] [facility];  Service: Cardiovascular;  Laterality: N/A;   ESOPHAGOGASTRODUODENOSCOPY N/A 12/05/2020   Procedure: ESOPHAGOGASTRODUODENOSCOPY (EGD);  Surgeon:  Lajuana Matte, MD;  Location: [facility];  Service: Thoracic;  Laterality: N/A;   LAMINECTOMY     MOLE REMOVAL     PROSTATE BIOPSY     RIGHT/LEFT HEART CATH AND CORONARY ANGIOGRAPHY N/A 10/07/2021   Procedure: RIGHT/LEFT HEART CATH AND CORONARY ANGIOGRAPHY;  Surgeon: Leonie Man, MD;  Location: [place] [facility];  Service: Cardiovascular;  Laterality: N/A;   TONSILLECTOMY AND ADENOIDECTOMY     UPPER GASTROINTESTINAL ENDOSCOPY     XI ROBOTIC ASSISTED PARAESOPHAGEAL HERNIA REPAIR N/A 12/05/2020   Procedure: XI ROBOTIC ASSISTED PARAESOPHAGEAL HERNIA REPAIR AND FUNDOPLICATION;  Surgeon: Lajuana Matte, MD;   Location: [place] [facility];  Service: Thoracic;  Laterality: N/A;    Family History  Problem Relation Age of Onset   Hyperlipidemia Mother    Aortic stenosis Mother    Valvular heart disease Mother    Colon cancer Mother    Diabetes Mellitus II Mother    Cancer - Colon Mother    Cancer Mother    Stroke Mother    Cerebrovascular Accident Mother    Diabetes Mother    Hypertension Mother    Hyperlipidemia Father    Diabetes Mellitus II Father    Diabetes Father    Alzheimer's disease Father    Esophageal cancer Paternal Uncle    CAD Maternal Grandmother    Congestive Heart Failure Maternal Grandmother    Heart attack Maternal Grandmother    Heart failure Maternal Grandmother    Coronary artery disease Maternal Grandmother    Hyperlipidemia Maternal Grandmother    Social History   Socioeconomic History   Marital status: Single    Spouse name: Not on file   Number of children: Not on file   Years of education: Not on file   Highest education level: Not on file  Occupational History   Not on file  Tobacco Use   Smoking status: Never   Smokeless tobacco: Never  Vaping Use   Vaping Use: Never used  Substance and Sexual Activity   Alcohol use: Yes    Comment: occasional   Drug use: Never   Sexual activity: Not on file  Other Topics Concern   Not on file  Social History Narrative   Not on file   Social Determinants of Health   Financial Resource Strain: Low Risk  (09/12/2021)   Overall Financial Resource Strain (CARDIA)    Difficulty of Paying Living Expenses: Not hard at all  Food Insecurity: No Food Insecurity (02/06/2022)   Hunger Vital Sign    Worried About Running Out of Food in the Last Year: Never true    Ran Out of Food in the Last Year: Never true  Transportation Needs: No Transportation Needs (09/12/2021)   PRAPARE - Hydrologist (Medical): No    Lack of Transportation (Non-Medical): No  Physical Activity: Unknown (09/12/2021)   Exercise  Vital Sign    Days of Exercise per Week: 0 days    Minutes of Exercise per Session: Not on file  Stress: No Stress Concern Present (09/12/2021)   [place]    Feeling of Stress : Only a little  Social Connections: Socially Isolated (09/12/2021)   Social Connection and Isolation Panel [NHANES]    Frequency of Communication with Friends and Family: More than three times a week    Frequency of Social Gatherings with Friends and Family: Once a week    Attends Religious Services: Never  Active Member of Clubs or Organizations: No    Attends Archivist Meetings: Never    Marital Status: Never married    Review of Systems  Constitutional:  Positive for fatigue. Negative for chills and fever.  HENT:  Negative for congestion, ear pain and sore throat.   Respiratory:  Negative for cough and shortness of breath.   Gastrointestinal:  Positive for constipation. Negative for abdominal pain, diarrhea, nausea and vomiting.  Endocrine: Negative for polydipsia, polyphagia and polyuria.  Genitourinary:  Negative for dysuria, frequency and urgency.  Musculoskeletal:  Negative for arthralgias and back pain.  Neurological:  Positive for dizziness. Negative for headaches.  Psychiatric/Behavioral:  Negative for dysphoric mood. The patient is not nervous/anxious.      Objective:  There were no vitals taken for this visit.     02/10/2022    3:20 PM 02/10/2022    3:19 PM 02/06/2022    4:56 PM  BP/Weight  Systolic BP 185 631 497  Diastolic BP 91 026 92  Wt. (Lbs)  196.4   BMI  26.64 kg/m2     Physical Exam  Diabetic Foot Exam - Simple   No data filed      Lab Results  Component Value Date   WBC 11.6 (H) 02/06/2022   HGB 13.4 02/06/2022   HCT 40.7 02/06/2022   PLT 210 02/06/2022   GLUCOSE 101 (H) 02/06/2022   CHOL 141 04/17/2021   TRIG 99 04/17/2021   HDL 57 04/17/2021   LDLCALC 66 04/17/2021   ALT 32  04/17/2021   AST 38 04/17/2021   NA 138 02/06/2022   K 4.0 02/06/2022   CL 104 02/06/2022   CREATININE 1.09 02/06/2022   BUN 10 02/06/2022   CO2 22 02/06/2022   TSH 3.150 09/25/2020   INR 1.0 12/03/2020      Assessment & Plan:   Problem List Items Addressed This Visit   None .  No orders of the defined types were placed in this encounter.   No orders of the defined types were placed in this encounter.    Follow-up: No follow-ups on file.  An After Visit Summary was printed and given to the patient.  Joe Brome, MD [facility] [phone]

## 2022-03-18 ENCOUNTER — Telehealth: Payer: Self-pay

## 2022-03-18 NOTE — Telephone Encounter (Signed)
Called patient and informed him that his heart monitor results were normal per Dr. Bettina Gavia. Patient was appreciative for the call and had no further questions at this time.

## 2022-03-18 NOTE — Telephone Encounter (Signed)
-----   Message from Richardo Priest, MD sent at 03/16/2022 11:07 AM EST ----- Event monitor reviewed and is normal.

## 2022-03-19 ENCOUNTER — Encounter: Payer: Self-pay | Admitting: Family Medicine

## 2022-03-19 NOTE — Assessment & Plan Note (Signed)
Well controlled.  ?No changes to medicines.  ?Continue to work on eating a healthy diet and exercise.  ?Labs drawn today.  ?

## 2022-03-19 NOTE — Assessment & Plan Note (Signed)
Stented. The current medical regimen is effective;  continue present plan and medications.

## 2022-03-19 NOTE — Assessment & Plan Note (Signed)
Unclear etiology.

## 2022-03-19 NOTE — Assessment & Plan Note (Signed)
Continue protonix 40mg daily.  

## 2022-03-19 NOTE — Assessment & Plan Note (Signed)
Continue aspirin, zetia, fish oil, and crestor. Management per specialist.  Follow up with Dr. Bettina Gavia.

## 2022-03-19 NOTE — Assessment & Plan Note (Addendum)
Unclear etiology. Very thorough work up.  Refer to immunology.

## 2022-03-25 DIAGNOSIS — Z0100 Encounter for examination of eyes and vision without abnormal findings: Secondary | ICD-10-CM | POA: Diagnosis not present

## 2022-03-27 DIAGNOSIS — I10 Essential (primary) hypertension: Secondary | ICD-10-CM | POA: Insufficient documentation

## 2022-03-28 ENCOUNTER — Other Ambulatory Visit: Payer: Self-pay

## 2022-03-29 NOTE — Progress Notes (Unsigned)
Cardiology Office Note:    Date:  03/31/2022   ID:  Joe Arroyo, DOB 04/10/1956, MRN [id number]  PCP:  [name], MD  Cardiologist:  Shirlee More, MD    Referring MD: [name], MD    ASSESSMENT:    1. Sinus tachycardia   2. Shortness of breath   3. Chest pain, unspecified type   4. Coronary artery disease involving native coronary artery of native heart with other form of angina pectoris ([place])   5. Resistant hypertension   6. Nonrheumatic aortic valve insufficiency   7. Mixed hyperlipidemia    PLAN:    In order of problems listed above:  He has become a complex visit with unexplained sinus tachycardia worsen hypertension shortness of breath at rest and no obvious precipitant except a COVID-19 infection in the first week of November.  He also had a CTA of the chest 02/05/2022 with normal pulmonary contour and no evidence of pulmonary embolism.  I think he is best served by going to the emergency room he needs to have a troponin checked proBNP level at least a chest x-ray and routine labs looking for any precipitant to his tachycardia and shortness of breath.  proBNP level was low 67 4 months ago. Continue current cardiac medication including his dual antiplatelet and will likely need a beta-blocker for hypertension and tachycardia His aortic regurgitation is not severe We will continue his statin   Next appointment: 6 weeks   Medication Adjustments/Labs and Tests Ordered: Current medicines are reviewed at length with the patient today.  Concerns regarding medicines are outlined above.  No orders of the defined types were placed in this encounter.  No orders of the defined types were placed in this encounter.   Chief complaint this began as a routine visit for follow-up after syncope with an alpha-blocker   History of Present Illness:    Joe Arroyo is a 66 y.o. male with a hx of CAD with PCI and stent posterior lateral branch of right coronary artery  10/07/2021 hypertension with resistant hypertension ascending aortic aneurysm mild aortic regurgitation and obstructive sleep apnea last seen 02/10/2022 following a syncopal episode associated with tamsulosin therapy orthostatic drop in blood pressure 40 mmHg documented during my office visit..  Compliance with diet, lifestyle and medications: Yes  This began as a routine visit and is evolved into an urgent visit He has been feeling worse in the last weeks more short of breath any activity wheezing heart racing and chest tightness.  Today it is worse than typical rounds in my office and sinus tachycardia at 133 bpm even sitting and resting his heart rate remains in the mid 120s. No obvious precipitant he has had no bleeding he is taking no medications to cause hypertension or tachycardia and he has not stopped taking clonidine. Around the time of his last visit COVID-19 infection since that time he has been increasingly short of breath Last visit he had a syncopal episode associated with an alpha-blocker.  Since that time his home blood pressures run 120-150/70-80.   He had a 2-week event monitor reported 03/16/2022 showing rare ventricular ectopy rare supraventricular ectopy brief runs of APCs longest 7 complexes duration and no bradycardia. He was referred for cardiopulmonary exercise test that has not been performed.  He had extensive testing performed including an echocardiogram 02/06/2022 with normal ejection fraction 60 to 65% normal right ventricular size and function mild to moderate aortic regurgitation half-time 427 ms  Left and right heart catheterization June  2023 resulted in PCI and stent right posterolateral branch of the right coronary artery otherwise normal CAD normal left ventricular ejection fraction right heart catheterization showed normal pulmonary pressure mean 15 mmHg normal wedge pressure 11 mmHg. 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   Shortness of breath 09/05/2019   Sinus bradycardia 05/14/2018   Syncope 09/05/2019   Thoracic ascending aortic aneurysm (HCC)    ascending TAA 4.6 x 4.4 cm 10/03/20 CTA chest    Past Surgical History:  Procedure Laterality Date   COLONOSCOPY     CORONARY STENT INTERVENTION N/A 10/07/2021   Procedure: CORONARY STENT INTERVENTION;  Surgeon: Leonie Man, MD;  Location: [facility];  Service: Cardiovascular;  Laterality: N/A;   ESOPHAGOGASTRODUODENOSCOPY N/A 12/05/2020   Procedure: ESOPHAGOGASTRODUODENOSCOPY (EGD);  Surgeon: Lajuana Matte, MD;  Location: [facility] OR;  Service: Thoracic;  Laterality: N/A;   LAMINECTOMY     MOLE REMOVAL     PROSTATE BIOPSY     RIGHT/LEFT HEART CATH AND CORONARY ANGIOGRAPHY N/A 10/07/2021   Procedure: RIGHT/LEFT HEART CATH AND CORONARY ANGIOGRAPHY;  Surgeon: Leonie Man, MD;  Location: [place] [facility];  Service: Cardiovascular;  Laterality: N/A;   TONSILLECTOMY AND ADENOIDECTOMY     UPPER GASTROINTESTINAL ENDOSCOPY     XI ROBOTIC ASSISTED PARAESOPHAGEAL HERNIA REPAIR N/A 12/05/2020   Procedure: XI ROBOTIC ASSISTED PARAESOPHAGEAL HERNIA REPAIR AND FUNDOPLICATION;  Surgeon: Lajuana Matte, MD;  Location: [facility] OR;  Service: Thoracic;  Laterality: N/A;    Current Medications: Current Meds  Medication Sig   aspirin EC 81 MG tablet Take 1 tablet (81 mg total) by mouth daily. Swallow whole.   cloNIDine (CATAPRES) 0.3 MG tablet Take 1 tablet (0.3 mg total) by mouth daily.    clopidogrel (PLAVIX) 75 MG tablet Take 1 tablet (75 mg total) by mouth daily.   esomeprazole (NEXIUM) 10 MG packet Take 10 mg by mouth daily before breakfast.   ezetimibe (ZETIA) 10 MG tablet Take 1 tablet (10 mg total) by mouth daily.   finasteride (PROSCAR) 5 MG tablet Take 5 mg by mouth daily.   fluticasone-salmeterol (ADVAIR HFA) 230-21 MCG/ACT inhaler Inhale 2 puffs into the lungs 2 (two) times daily.   furosemide (LASIX) 40 MG tablet Take 1 tablet (40 mg total) by mouth 2 (two) times daily.   ibuprofen (ADVIL) 200 MG tablet Take 400 mg by mouth every 6 (six) hours as needed for headache.   meloxicam (MOBIC) 15 MG tablet Take 1 tablet (15 mg total) by mouth daily as needed. (Patient taking differently: Take 15 mg by mouth daily as needed for pain.)   Multiple Vitamins-Minerals (CENTRUM SILVER ULTRA MENS PO) Take 1 tablet by mouth daily.   Omega-3 Fatty Acids (FISH OIL PO) Take 1 tablet by mouth 2 (two) times daily.   pantoprazole (PROTONIX) 40 MG tablet    rosuvastatin (CRESTOR) 40 MG tablet Take 1 tablet (40 mg total) by mouth daily.     Allergies:   Nitroglycerin, Beta  adrenergic blockers, Bystolic [nebivolol hcl], Carvedilol, Pravastatin, Tamsulosin, Hctz [hydrochlorothiazide], Hydralazine, and Penicillins   Social History   Socioeconomic History   Marital status: Single    Spouse name: Not on file   Number of children: Not on file   Years of education: Not on file   Highest education level: Not on file  Occupational History   Not on file  Tobacco Use   Smoking status: Never   Smokeless tobacco: Never  Vaping Use   Vaping Use: Never used  Substance and Sexual Activity   Alcohol use: Yes    Comment: occasional   Drug use: Never   Sexual activity: Not on file  Other Topics Concern   Not on file  Social History Narrative   Not on file   Social Determinants of Health   Financial Resource Strain: Low Risk  (09/12/2021)   Overall Financial Resource Strain (CARDIA)     Difficulty of Paying Living Expenses: Not hard at all  Food Insecurity: No Food Insecurity (02/06/2022)   Hunger Vital Sign    Worried About Running Out of Food in the Last Year: Never true    Ran Out of Food in the Last Year: Never true  Transportation Needs: No Transportation Needs (09/12/2021)   PRAPARE - Hydrologist (Medical): No    Lack of Transportation (Non-Medical): No  Physical Activity: Unknown (09/12/2021)   Exercise Vital Sign    Days of Exercise per Week: 0 days    Minutes of Exercise per Session: Not on file  Stress: No Stress Concern Present (09/12/2021)   [place]    Feeling of Stress : Only a little  Social Connections: Socially Isolated (09/12/2021)   Social Connection and Isolation Panel [NHANES]    Frequency of Communication with Friends and Family: More than three times a week    Frequency of Social Gatherings with Friends and Family: Once a week    Attends Religious Services: Never    Marine scientist or Organizations: No    Attends Music therapist: Never    Marital Status: Never married     Family History: The patient's family history includes Alzheimer's disease in his father; Aortic stenosis in his mother; CAD in his maternal grandmother; Cancer in his mother; Cancer - Colon in his mother; Cerebrovascular Accident in his mother; Colon cancer in his mother; Congestive Heart Failure in his maternal grandmother; Coronary artery disease in his maternal grandmother; Diabetes in his father and mother; Diabetes Mellitus II in his father and mother; Esophageal cancer in his paternal uncle; Heart attack in his maternal grandmother; Heart failure in his maternal grandmother; Hyperlipidemia in his father, maternal grandmother, and mother; Hypertension in his mother; Stroke in his mother; Valvular heart disease in his mother. ROS:   Please see the history of present  illness.    All other systems reviewed and are negative.  EKGs/Labs/Other Studies Reviewed:    The following studies were reviewed today:  EKG:  EKG ordered today and personally reviewed.  The ekg ordered today demonstrates sinus tachycardia and nonspecific ST changes  Recent Labs: 04/17/2021: ALT 32 11/14/2021: NT-Pro BNP 67 02/06/2022: BUN 10; Creatinine, Ser 1.09; Hemoglobin 13.4; Platelets 210; Potassium 4.0; Sodium 138  Recent Lipid Panel    Component Value Date/Time   CHOL 141 04/17/2021 0944   TRIG 99 04/17/2021 0944   HDL 57 04/17/2021 0944   CHOLHDL 2.5 04/17/2021 0944  Platinum 66 04/17/2021 0944    Physical Exam:    VS:  BP (!) 182/100 (BP Location: Right Arm, Patient Position: Sitting)   Pulse (!) 131   Ht 6' (1.829 m)   Wt 205 lb (93 kg)   SpO2 95%   BMI 27.80 kg/m     Wt Readings from Last 3 Encounters:  03/31/22 205 lb (93 kg)  03/17/22 201 lb (91.2 kg)  02/10/22 196 lb 6.4 oz (89.1 kg)     GEN: He is breathless at rest well nourished, well developed in no acute distress HEENT: Normal NECK: No JVD; No carotid bruits LYMPHATICS: No lymphadenopathy CARDIAC: RRR, no murmurs, rubs, gallops RESPIRATORY:  Clear to auscultation without rales, wheezing or rhonchi  ABDOMEN: Soft, non-tender, non-distended MUSCULOSKELETAL:  No edema; No deformity  SKIN: Warm and dry NEUROLOGIC:  Alert and oriented x 3 PSYCHIATRIC:  Normal affect    Signed, Shirlee More, MD  03/31/2022 1:24 PM    [facility]

## 2022-03-31 ENCOUNTER — Ambulatory Visit: Payer: Medicare HMO | Attending: Cardiology | Admitting: Cardiology

## 2022-03-31 ENCOUNTER — Encounter: Payer: Self-pay | Admitting: Cardiology

## 2022-03-31 VITALS — BP 182/100 | HR 131 | Ht 72.0 in | Wt 205.0 lb

## 2022-03-31 DIAGNOSIS — I25118 Atherosclerotic heart disease of native coronary artery with other forms of angina pectoris: Secondary | ICD-10-CM | POA: Diagnosis not present

## 2022-03-31 DIAGNOSIS — N400 Enlarged prostate without lower urinary tract symptoms: Secondary | ICD-10-CM | POA: Diagnosis not present

## 2022-03-31 DIAGNOSIS — J684 Chronic respiratory conditions due to chemicals, gases, fumes and vapors: Secondary | ICD-10-CM | POA: Diagnosis not present

## 2022-03-31 DIAGNOSIS — E860 Dehydration: Secondary | ICD-10-CM | POA: Diagnosis not present

## 2022-03-31 DIAGNOSIS — I2510 Atherosclerotic heart disease of native coronary artery without angina pectoris: Secondary | ICD-10-CM | POA: Diagnosis not present

## 2022-03-31 DIAGNOSIS — R9431 Abnormal electrocardiogram [ECG] [EKG]: Secondary | ICD-10-CM | POA: Diagnosis not present

## 2022-03-31 DIAGNOSIS — F419 Anxiety disorder, unspecified: Secondary | ICD-10-CM | POA: Diagnosis not present

## 2022-03-31 DIAGNOSIS — N170 Acute kidney failure with tubular necrosis: Secondary | ICD-10-CM | POA: Diagnosis not present

## 2022-03-31 DIAGNOSIS — E8729 Other acidosis: Secondary | ICD-10-CM | POA: Diagnosis not present

## 2022-03-31 DIAGNOSIS — R0602 Shortness of breath: Secondary | ICD-10-CM | POA: Diagnosis not present

## 2022-03-31 DIAGNOSIS — R0789 Other chest pain: Secondary | ICD-10-CM | POA: Diagnosis not present

## 2022-03-31 DIAGNOSIS — R079 Chest pain, unspecified: Secondary | ICD-10-CM

## 2022-03-31 DIAGNOSIS — J432 Centrilobular emphysema: Secondary | ICD-10-CM | POA: Diagnosis not present

## 2022-03-31 DIAGNOSIS — E782 Mixed hyperlipidemia: Secondary | ICD-10-CM

## 2022-03-31 DIAGNOSIS — R000 Tachycardia, unspecified: Secondary | ICD-10-CM | POA: Diagnosis not present

## 2022-03-31 DIAGNOSIS — Z888 Allergy status to other drugs, medicaments and biological substances status: Secondary | ICD-10-CM | POA: Diagnosis not present

## 2022-03-31 DIAGNOSIS — Z87442 Personal history of urinary calculi: Secondary | ICD-10-CM | POA: Diagnosis not present

## 2022-03-31 DIAGNOSIS — K219 Gastro-esophageal reflux disease without esophagitis: Secondary | ICD-10-CM | POA: Diagnosis not present

## 2022-03-31 DIAGNOSIS — Z7902 Long term (current) use of antithrombotics/antiplatelets: Secondary | ICD-10-CM | POA: Diagnosis not present

## 2022-03-31 DIAGNOSIS — U071 COVID-19: Secondary | ICD-10-CM | POA: Diagnosis not present

## 2022-03-31 DIAGNOSIS — I10 Essential (primary) hypertension: Secondary | ICD-10-CM | POA: Diagnosis not present

## 2022-03-31 DIAGNOSIS — Z79899 Other long term (current) drug therapy: Secondary | ICD-10-CM | POA: Diagnosis not present

## 2022-03-31 DIAGNOSIS — I1A0 Resistant hypertension: Secondary | ICD-10-CM | POA: Diagnosis not present

## 2022-03-31 DIAGNOSIS — T592X1A Toxic effect of formaldehyde, accidental (unintentional), initial encounter: Secondary | ICD-10-CM | POA: Diagnosis not present

## 2022-03-31 DIAGNOSIS — I351 Nonrheumatic aortic (valve) insufficiency: Secondary | ICD-10-CM

## 2022-03-31 DIAGNOSIS — Z880 Allergy status to penicillin: Secondary | ICD-10-CM | POA: Diagnosis not present

## 2022-04-01 DIAGNOSIS — N170 Acute kidney failure with tubular necrosis: Secondary | ICD-10-CM | POA: Diagnosis not present

## 2022-04-01 DIAGNOSIS — E8729 Other acidosis: Secondary | ICD-10-CM | POA: Diagnosis not present

## 2022-04-01 DIAGNOSIS — R0789 Other chest pain: Secondary | ICD-10-CM | POA: Diagnosis not present

## 2022-04-01 DIAGNOSIS — I10 Essential (primary) hypertension: Secondary | ICD-10-CM | POA: Diagnosis not present

## 2022-04-02 DIAGNOSIS — E8729 Other acidosis: Secondary | ICD-10-CM | POA: Diagnosis not present

## 2022-04-02 DIAGNOSIS — N170 Acute kidney failure with tubular necrosis: Secondary | ICD-10-CM | POA: Diagnosis not present

## 2022-04-02 DIAGNOSIS — I10 Essential (primary) hypertension: Secondary | ICD-10-CM | POA: Diagnosis not present

## 2022-04-03 ENCOUNTER — Encounter: Payer: Self-pay | Admitting: *Deleted

## 2022-04-03 ENCOUNTER — Telehealth: Payer: Self-pay | Admitting: *Deleted

## 2022-04-03 NOTE — Patient Outreach (Signed)
Care Coordination TOC Note Transition Care Management Follow-up Telephone Call Date of discharge and from where: Wednesday, 04/02/22 [facility]; SVT, shortness of breath, HTN How have you been since you were released from the hospital? "I am doing fine right now this minute-- but I woke up today with terrible pain and am pretty sure I am trying to pass a kidney stone-- it feels the same way all of my other kidney stones have felt.  Right now the pain has subsided, but in the past, it tends to come and go.  I do have a urologist, I will consider calling him if it gets worse.  At the hospital, I was told that they got my blood pressure and my heart rate under control.  They have made a few changes to my medicines and I have followed their instructions-- I don't want to get up right now, but I understand everything they changed and have made those changes.  Please have someone at [facility] call me to schedule the hospital follow up visit" Any questions or concerns? Yes 1) patient believes he may be passing kidney stone today- advised to contact urology provider asap if pain return or becomes unmanageable 2) no hospital follow up visit scheduled with PCP or cardiology provider- messaged [facility] ADMIN pool to facilitate scheduling of post-hospital follow up PCP office visit; provided education around importance of prompt hospital discharge follow up appointments with both PCP and cardiology providers  Items Reviewed: Did the pt receive and understand the discharge instructions provided? Yes  Medications obtained and verified? No - patient declined medication review, as above; tells me he is aware of all medication changes pot-hospital visit and confirms he has made these changes Other? No  Any new allergies since your discharge? Yes  Dietary orders reviewed? Yes Do you have support at home? Yes  temporarily residing with  his father, post hospital discharge 04/02/22; patient reports he is independent in  all aspects of self-care  Home Care and Equipment/Supplies: Were home health services ordered? no If so, what is the name of the agency? N/A  Has the agency set up a time to come to the patient's home? not applicable Were any new equipment or medical supplies ordered?  No What is the name of the medical supply agency? N/A Were you able to get the supplies/equipment? not applicable Do you have any questions related to the use of the equipment or supplies? No N/A  Functional Questionnaire: (I = Independent and D = Dependent) ADLs: I  Bathing/Dressing- I  Meal Prep- I  Eating- I  Maintaining continence- I  Transferring/Ambulation- I  Managing Meds- I  Follow up appointments reviewed:  PCP Hospital f/u appt confirmed? No  Scheduled to see - on - @ - as above, sent message to [facility] pool to request scheduling of prompt post-hospital discharge follow up appointment [facility] f/u appt confirmed? No  Scheduled to see - on - @ - Are transportation arrangements needed? No  If their condition worsens, is the pt aware to call PCP or go to the Emergency Dept.? Yes Was the patient provided with contact information for the PCP's office or ED? No- patient declined; reports already has contact information for all care providers Was to pt encouraged to call back with questions or concerns? Yes  SDOH assessments and interventions completed:   Yes SDOH Interventions Today    Flowsheet Row Most Recent Value  SDOH Interventions   Food Insecurity Interventions Intervention Not Indicated  Transportation Interventions Intervention Not Indicated  [drives self,  father assists as/ if indicated]      Care Coordination Interventions:  PCP follow up appointment requested Provided education/ discussion around importance of scheduling prompt post-hospital discharge appointments with PCP and cardiology providers; pain assessment completed    Encounter Outcome:  Pt. Visit Completed     Oneta Rack, RN, BSN, CCRN Alumnus RN CM Care Coordination/ Transition of [place] Management [phone]: direct office

## 2022-04-22 ENCOUNTER — Other Ambulatory Visit: Payer: Self-pay | Admitting: Cardiology

## 2022-04-22 NOTE — Telephone Encounter (Signed)
Clopidogrel 75 mg # 90 x 1 refill sent to [facility], [place], [place]

## 2022-04-29 ENCOUNTER — Other Ambulatory Visit: Payer: Self-pay

## 2022-04-29 DIAGNOSIS — E782 Mixed hyperlipidemia: Secondary | ICD-10-CM

## 2022-04-29 MED ORDER — ROSUVASTATIN CALCIUM 40 MG PO TABS: 40.0000 mg | ORAL_TABLET | Freq: Every day | ORAL | 0 refills | Status: DC

## 2022-05-02 ENCOUNTER — Encounter: Payer: Self-pay | Admitting: Internal Medicine

## 2022-05-02 ENCOUNTER — Telehealth: Payer: Self-pay

## 2022-05-02 ENCOUNTER — Ambulatory Visit: Payer: Medicare HMO | Admitting: Internal Medicine

## 2022-05-02 VITALS — BP 136/82 | HR 105 | Resp 16 | Ht 72.0 in | Wt 203.8 lb

## 2022-05-02 DIAGNOSIS — K449 Diaphragmatic hernia without obstruction or gangrene: Secondary | ICD-10-CM | POA: Diagnosis not present

## 2022-05-02 DIAGNOSIS — J328 Other chronic sinusitis: Secondary | ICD-10-CM

## 2022-05-02 DIAGNOSIS — I119 Hypertensive heart disease without heart failure: Secondary | ICD-10-CM

## 2022-05-02 DIAGNOSIS — K219 Gastro-esophageal reflux disease without esophagitis: Secondary | ICD-10-CM | POA: Diagnosis not present

## 2022-05-02 DIAGNOSIS — G4733 Obstructive sleep apnea (adult) (pediatric): Secondary | ICD-10-CM | POA: Diagnosis not present

## 2022-05-02 DIAGNOSIS — I2510 Atherosclerotic heart disease of native coronary artery without angina pectoris: Secondary | ICD-10-CM | POA: Diagnosis not present

## 2022-05-02 DIAGNOSIS — I7121 Aneurysm of the ascending aorta, without rupture: Secondary | ICD-10-CM

## 2022-05-02 DIAGNOSIS — J302 Other seasonal allergic rhinitis: Secondary | ICD-10-CM

## 2022-05-02 DIAGNOSIS — R0609 Other forms of dyspnea: Secondary | ICD-10-CM | POA: Diagnosis not present

## 2022-05-02 DIAGNOSIS — R0602 Shortness of breath: Secondary | ICD-10-CM | POA: Diagnosis not present

## 2022-05-02 DIAGNOSIS — J310 Chronic rhinitis: Secondary | ICD-10-CM

## 2022-05-02 HISTORY — DX: Other chronic sinusitis: J32.8

## 2022-05-02 HISTORY — DX: Other seasonal allergic rhinitis: J30.2

## 2022-05-02 MED ORDER — LEVOCETIRIZINE DIHYDROCHLORIDE 5 MG PO TABS: 5.0000 mg | ORAL_TABLET | Freq: Every evening | ORAL | 5 refills | Status: DC

## 2022-05-02 MED ORDER — AZELASTINE HCL 0.1 % NA SOLN: NASAL | 5 refills | Status: AC

## 2022-05-02 MED ORDER — MONTELUKAST SODIUM 10 MG PO TABS: 10.0000 mg | ORAL_TABLET | Freq: Every day | ORAL | 5 refills | Status: DC

## 2022-05-02 MED ORDER — IPRATROPIUM BROMIDE 0.06 % NA SOLN: NASAL | 5 refills | Status: AC

## 2022-05-02 MED ORDER — FLUTICASONE PROPIONATE 50 MCG/ACT NA SUSP: NASAL | 5 refills | Status: AC

## 2022-05-02 NOTE — Progress Notes (Signed)
New Patient Note  RE: Joe Arroyo MRN: [id number] DOB: 1956/01/14 Date of Office Visit: 05/02/2022  Consult requested by: [name], MD Primary care provider: [name], MD  Chief Complaint: Shortness of Breath and Allergies  History of Present Illness: I had the pleasure of seeing Joe Arroyo for initial evaluation at the [facility] on 05/02/2022. He is a 67 y.o. male, who is referred here by [name], MD for the evaluation of dyspnea .  History obtained from patient, chart review.  Started after having influenza in December 2021 he has had chest pressure, shortness of breath, coughing and wheezing.  He does have nocturnal coughing.   He has been treated with breo, breztri and advair without good response.  He has not had a rescue inhaler.  Exercise and bending over triggers symptoms.  He is having daily and nightly symptoms.   He does have heartburn which is controlled with nexium  Associated with chronic headache, sinus pressure, nasal congestion.  He has been treating the symptoms with Afrin every other day. He has never tried other nasal sprays, prednisone, antihistamines or been evaluated for chronic sinusitis.   He has had a full pulmonary and cardiology workup summarized below.  He has completed pulmonary rehab without good effect.    Chest imaging: HRCT Chest 03/03/21 Mediastinum/Nodes: No enlarged mediastinal, hilar, or axillary lymph nodes. Small hiatal hernia. Thyroid gland, trachea, and esophagus demonstrate no significant findings.  Lungs/Pleura: No evidence of fibrotic interstitial lung disease. Similar appearance of mild, bland appearing bandlike scarring of the bilateral lung bases. Mild, lobular air trapping on expiratory phase imaging. No pleural effusion or pneumothorax.    CTA Chest 10/03/20 Minimal, bland appearing scarring or partial atelectasis in bilateral lung bases, including compressive atelectasis of the left  lung base secondary to hiatal hernia.  - Ascending thoracic aortic aneurysm, 4.6 x 4.4cm   DG Sniff Test 08/13/20 No evidence for hemidiaphragmatic paralysis. Normal bilateral hemidiaphragmatic excursion. Hiatal hernia again noted.    HRCT Chest 06/21/20 - No evidence of interstitial lung disease. Mild air trapping is indicative of small airways disease. -  Large hiatal hernia.  Cardiac Studies:  He had left and right heart catheterization 10/07/2021 he had PCI and drug-eluting stent to the posterior lateral branch right coronary artery otherwise angiographically normal coronaries.  Right heart catheterization was normal except for mildly reduced cardiac index.   Echo 05/11/20:  1. Left ventricular ejection fraction, by estimation, is 60 to 65%. The  left ventricle has normal function. The left ventricle has no regional  wall motion abnormalities. There is mild left ventricular hypertrophy.  Left ventricular diastolic parameters  are consistent with Grade I diastolic dysfunction (impaired relaxation).   2. Right ventricular systolic function is normal. The right ventricular  size is normal.   3. The mitral valve is normal in structure. No evidence of mitral valve  regurgitation. No evidence of mitral stenosis.   4. The aortic valve is normal in structure. There is mild calcification  of the aortic valve. There is mild thickening of the aortic valve. Aortic  valve regurgitation is mild. No aortic stenosis is present.   5. There is moderate dilatation of the ascending aorta, measuring 45 mm.   6. The inferior vena cava is normal in size with greater than 50%  respiratory variability, suggesting right atrial pressure of 3 mmHg.   Assessment and Plan: Joe Arroyo is a 67 y.o. male with: Dyspnea on exertion - Plan: Spirometry  with Graph  Other seasonal allergic rhinitis - Plan: Allergy Test, Interdermal Allergy Test  Other chronic sinusitis - Plan: Allergy Test, Interdermal Allergy  Test  Hypertensive heart disease without heart failure  Coronary artery disease involving native coronary artery of native heart without angina pectoris  Aneurysm of ascending aorta without rupture (HCC)  OSA (obstructive sleep apnea)  Hiatal hernia  Gastroesophageal reflux disease without esophagitis Plan: Patient Instructions  Breathing tests today showed mild restrictive pattern with no significant postbronchodilator response Allergy testing today showed positive to grass pollen and weed pollen   I think your dyspnea is multifactorial.  I would continue to follow with with cardiology and pulmonary for work up and management of other pulmonary and cardiac causes for dyspnea.   Consider stopping inhalers as they have not helped and this is no evidence of inflammation in your lungs  At this point some of your symptoms may be referred from your upper airway (nose and sinuses) and we will address these today.   Seasonal and  Rhinitis not well controlled: - STOP  Afrin use, this can cause worsening of any nose symptoms  - We will get sinus CT to evaluate for chronic sinus disease  - consider allergy shots as long term control of your symptoms by teaching your immune system to be more tolerant of your allergy triggers - Start Nasal Steroid Spray: Options include Flonase (fluticasone), Nasocort (triamcinolone), Nasonex (mometasome) 1- 2 sprays in each nostril daily (can buy over-the-counter if not covered by insurance)  Best results if used daily. - Start Astelin (Azelastine) 1-2 sprays in each nostril twice a day as needed.  You may use this as needed for nasal congestion/itchy ears/itchy nose if desired - Start Atrovent (Ipratropium Bromide) 1-2 sprays in each nostril up to 3 times a day as needed for runny nose/post nasal drip/drainage.  Use less frequently if airway gets too dry. - Start Singulair (Montelukast) '10mg'$  nightly. - Start over the counter antihistamine daily or daily as  needed.   -Your options include Zyrtec (Cetirizine) '10mg'$ , Claritin (Loratadine) '10mg'$ , Allegra (Fexofenadine) '180mg'$ , or Xyzal (Levocetirinze) '5mg'$   Follow up: 4 weeks   Thank you so much for letting me partake in your care today.  Don't hesitate to reach out if you have any additional concerns!  Roney Marion, MD  [facility] [place] [place]  Reducing Pollen Exposure  The American Academy of Allergy, Asthma and Immunology suggests the following steps to reduce your exposure to pollen during allergy seasons.    Do not hang sheets or clothing out to dry; pollen may collect on these items. Do not mow lawns or spend time around freshly cut grass; mowing stirs up pollen. Keep windows closed at night.  Keep car windows closed while driving. Minimize morning activities outdoors, a time when pollen counts are usually at their highest. Stay indoors as much as possible when pollen counts or humidity is high and on windy days when pollen tends to remain in the air longer. Use air conditioning when possible.  Many air conditioners have filters that trap the pollen spores. Use a HEPA room air filter to remove pollen form the indoor air you breathe.    Meds ordered this encounter  Medications   fluticasone (FLONASE) 50 MCG/ACT nasal spray    Sig: 1-2 sprays in each nostril daily    Dispense:  16 g    Refill:  5   azelastine (ASTELIN) 0.1 % nasal spray    Sig: 1-2  sprays in each nostril twice a day as needed    Dispense:  30 mL    Refill:  5   ipratropium (ATROVENT) 0.06 % nasal spray    Sig: 1-2 sprays in each nostril up to 3 times a day as needed for runny nose/post nasal drip/drainage    Dispense:  15 mL    Refill:  5   montelukast (SINGULAIR) 10 MG tablet    Sig: Take 1 tablet (10 mg total) by mouth at bedtime.    Dispense:  30 tablet    Refill:  5   levocetirizine (XYZAL) 5 MG tablet    Sig: Take 1 tablet (5 mg total) by mouth every evening.    Dispense:  30 tablet     Refill:  5   Lab Orders  No laboratory test(s) ordered today    Other allergy screening: Asthma: no Rhino conjunctivitis: yes Food allergy: no Medication allergy: no Hymenoptera allergy: no Urticaria: no Eczema:no History of recurrent infections suggestive of immunodeficency: no  Diagnostics: Spirometry:  Tracings reviewed. His effort: Good reproducible efforts. FVC: 3.36L FEV1: 2.88L, 82%% predicted FEV1/FVC ratio: 83% Interpretation: Spirometry consistent with normal pattern. After 4 puffs of albuterol FEV1 decreased by 60 cc and 2%, FVC increased by 23 seconds and 0.5% Please see scanned spirometry results for details.  Skin Testing: Environmental allergy panel. Epicutaneous testing was positive to grass pollen and weed pollen Intradermal testing was negative Results interpreted by myself and discussed with patient/family.  Airborne Adult Perc - 05/02/22 1018     Time Antigen Placed 1018    Allergen Manufacturer Greer    Location Back    Number of Test 59    Panel 1 Select    1. Control-Buffer 50% Glycerol Negative    2. Control-Histamine 1 mg/ml 4+    3. Albumin saline Negative    4. [place] 4+    5. [place] 2+    6. Johnson 4+    7. Hayden 4+    8. Meadow Fescue 4+    9. Perennial Rye 4+    10. Sweet Vernal 4+    11. Timothy 4+    12. Cocklebur Negative    13. Burweed Marshelder 3+    14. Ragweed, short Negative    15. Ragweed, Giant Negative    16. Plantain,  English 3+    17. Lamb's Quarters 3+    18. Sheep Sorrell Negative    19. Rough Pigweed Negative    20. Marsh Elder, Rough Negative    21. Mugwort, Common 3+    22. Ash mix Negative    23. Birch mix Negative    24. Beech American Negative    25. Box, Elder Negative    26. Cedar, red Negative    27. Cottonwood, [place] Negative    28. Elm mix Negative    29. Hickory Negative    30. Maple mix Negative    31. Oak, [place] mix Negative    32. Pecan Pollen Negative    33. Pine mix  Negative    34. Sycamore Eastern Negative    35. Ashburn, Black Pollen Negative    36. Alternaria alternata Negative    37. Cladosporium Herbarum Negative    38. Aspergillus mix Negative    39. Penicillium mix Negative    40. Bipolaris sorokiniana (Helminthosporium) Negative    41. Drechslera spicifera (Curvularia) Negative    42. Mucor plumbeus Negative    43. Fusarium moniliforme Negative  44. Aureobasidium pullulans (pullulara) Negative    45. Rhizopus oryzae Negative    46. Botrytis cinera Negative    47. Epicoccum nigrum Negative    48. Phoma betae Negative    49. Candida Albicans Negative    50. Trichophyton mentagrophytes Negative    51. Mite, D Farinae  5,000 AU/ml Negative    52. Mite, D Pteronyssinus  5,000 AU/ml Negative    53. Cat Hair 10,000 BAU/ml Negative    54.  Dog Epithelia Negative    55. Mixed Feathers Negative    56. Horse Epithelia Negative    57. Cockroach, German Negative    58. Mouse Negative    59. Tobacco Leaf Negative             Food Perc - 05/02/22 1018       Test Information   Time Antigen Placed 1018    Allergen Manufacturer Lavella Hammock    Location Back    Number of allergen test [place]   1. Peanut Negative    2. Soybean food Negative    3. Wheat, whole Negative    4. Sesame Negative    5. Milk, cow Negative    6. Egg White, chicken Negative    7. Casein Negative    8. Shellfish mix Negative    9. Fish mix Negative    10. Cashew Negative             Intradermal - 05/02/22 1052     Time Antigen Placed 1052    Allergen Manufacturer Greer    Location Arm    Number of Test 11    Intradermal Select    Control Negative    Ragweed mix 2+    Tree mix Negative    Mold 1 Negative    Mold 2 Negative    Mold 3 Negative    Mold 4 Negative    Cat Negative    Dog Negative    Cockroach Negative    Mite mix Negative             Past Medical History: Patient Active Problem List   Diagnosis Date Noted    Other chronic sinusitis 05/02/2022   Other seasonal allergic rhinitis 05/02/2022   Essential hypertension, benign 03/27/2022   Other fatigue 03/17/2022   Coronary artery disease    Abnormal cardiac CT angiography    History of hiatal hernia 04/24/2021   History of kidney stones 04/24/2021   Thoracic ascending aortic aneurysm ([place]) 04/24/2021   Chronic idiopathic constipation 01/07/2021   Atypical chest pain 01/07/2021   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   GERD (gastroesophageal reflux disease) 09/28/2020   OSA (obstructive sleep apnea) 09/28/2020   Hiatal hernia 09/28/2020   Syncope 09/05/2019   Dyspnea on exertion 09/05/2019   Non-seasonal allergic rhinitis due to pollen 08/16/2019   Hypertensive heart disease 05/14/2018   Hyperlipidemia 05/14/2018   Sinus bradycardia 05/14/2018   Past Medical History:  Diagnosis Date   Atypical chest pain 01/07/2021   Chronic idiopathic constipation 01/07/2021   Cough 09/05/2019   Dyspnea    Essential hypertension, benign 05/14/2018   GERD (gastroesophageal reflux disease) 09/28/2020   Hiatal hernia 09/28/2020   History of hiatal hernia    History of kidney stones    Hyperlipidemia 05/14/2018   Malaise 09/05/2019   Non-seasonal allergic rhinitis due to pollen 08/16/2019   OSA (obstructive sleep apnea) 09/28/2020   Other headache syndrome 08/16/2019  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   Shortness of breath 09/05/2019   Sinus bradycardia 05/14/2018   Syncope 09/05/2019   Thoracic ascending aortic aneurysm (HCC)    ascending TAA 4.6 x 4.4 cm 10/03/20 CTA chest   Past Surgical History: Past Surgical History:  Procedure Laterality Date   COLONOSCOPY     CORONARY STENT INTERVENTION N/A 10/07/2021   Procedure: CORONARY STENT INTERVENTION;  Surgeon: Leonie Man, MD;  Location: [facility];  Service: Cardiovascular;  Laterality: N/A;   ESOPHAGOGASTRODUODENOSCOPY N/A 12/05/2020   Procedure: ESOPHAGOGASTRODUODENOSCOPY (EGD);  Surgeon: Lajuana Matte, MD;  Location: [facility] OR;  Service: Thoracic;  Laterality: N/A;   LAMINECTOMY     MOLE REMOVAL     PROSTATE BIOPSY     RIGHT/LEFT HEART CATH AND CORONARY ANGIOGRAPHY N/A 10/07/2021   Procedure: RIGHT/LEFT HEART CATH AND CORONARY ANGIOGRAPHY;  Surgeon: Leonie Man, MD;  Location: [facility];  Service: Cardiovascular;  Laterality: N/A;   TONSILLECTOMY AND ADENOIDECTOMY     UPPER GASTROINTESTINAL ENDOSCOPY     XI ROBOTIC ASSISTED PARAESOPHAGEAL HERNIA REPAIR N/A 12/05/2020   Procedure: XI ROBOTIC ASSISTED PARAESOPHAGEAL HERNIA REPAIR AND FUNDOPLICATION;  Surgeon: Lajuana Matte, MD;  Location: [facility] OR;  Service: Thoracic;  Laterality: N/A;   Medication List:  Current Outpatient Medications  Medication Sig Dispense Refill   amLODipine (NORVASC) 2.5 MG tablet Take 2.5 mg by mouth daily.     azelastine (ASTELIN) 0.1 % nasal spray 1-2 sprays in each nostril twice a day as needed 30 mL 5   BREO ELLIPTA 100-25 MCG/ACT AEPB Inhale 1 puff into the lungs daily.     cloNIDine (CATAPRES) 0.3 MG tablet Take 1 tablet (0.3 mg total) by mouth daily. 90 tablet 1   clopidogrel (PLAVIX) 75 MG tablet TAKE ONE TABLET BY MOUTH DAILY 90 tablet 1   esomeprazole (NEXIUM) 10 MG packet Take 10 mg by mouth daily before breakfast.     ezetimibe (ZETIA) 10 MG tablet Take 1 tablet (10 mg total) by mouth daily. 90 tablet 1   finasteride (PROSCAR) 5 MG tablet Take 5 mg by mouth daily.     fluticasone (FLONASE) 50 MCG/ACT nasal spray 1-2 sprays in each nostril daily 16 g 5   furosemide (LASIX) 40 MG tablet Take 1 tablet (40 mg total) by mouth 2 (two) times daily. 180 tablet 3   ipratropium (ATROVENT) 0.06 % nasal spray 1-2 sprays in each nostril up to 3 times a day as needed for runny nose/post nasal drip/drainage 15 mL 5   levocetirizine (XYZAL) 5 MG tablet Take 1 tablet (5 mg total) by mouth every evening. 30 tablet 5   meloxicam (MOBIC) 15 MG tablet Take 1 tablet (15 mg total) by mouth daily as  needed. (Patient taking differently: Take 15 mg by mouth daily as needed for pain.) 90 tablet 0   montelukast (SINGULAIR) 10 MG tablet Take 1 tablet (10 mg total) by mouth at bedtime. 30 tablet 5   Multiple Vitamins-Minerals (CENTRUM SILVER ULTRA MENS PO) Take 1 tablet by mouth daily.     Omega-3 Fatty Acids (FISH OIL PO) Take 1 tablet by mouth 2 (two) times daily.     rosuvastatin (CRESTOR) 40 MG tablet Take 1 tablet (40 mg total) by mouth daily. 90 tablet 0   No current facility-administered medications for this visit.   Allergies: Allergies  Allergen Reactions   Nitroglycerin Other (See Comments)    Severe hypotension, caused 100 point drop in systolic BP  Beta Adrenergic Blockers Other (See Comments)    bradycardia   Bystolic [Nebivolol Hcl] Other (See Comments)    bradycardia   Carvedilol Other (See Comments)    bradycardia   Pravastatin Other (See Comments)    "made my joints hurt"   Tamsulosin Other (See Comments)    Syncope   Hctz [Hydrochlorothiazide] Rash   Hydralazine Rash   Penicillins Rash   Social History: Social History   Socioeconomic History   Marital status: Single    Spouse name: Not on file   Number of children: Not on file   Years of education: Not on file   Highest education level: Not on file  Occupational History   Not on file  Tobacco Use   Smoking status: Never   Smokeless tobacco: Never  Vaping Use   Vaping Use: Never used  Substance and Sexual Activity   Alcohol use: Yes    Comment: occasional   Drug use: Never   Sexual activity: Not on file  Other Topics Concern   Not on file  Social History Narrative   Not on file   Social Determinants of Health   Financial Resource Strain: Low Risk  (09/12/2021)   Overall Financial Resource Strain (CARDIA)    Difficulty of Paying Living Expenses: Not hard at all  Food Insecurity: No Food Insecurity (04/03/2022)   Hunger Vital Sign    Worried About Running Out of Food in the Last Year: Never  true    Ran Out of Food in the Last Year: Never true  Transportation Needs: No Transportation Needs (04/03/2022)   PRAPARE - Hydrologist (Medical): No    Lack of Transportation (Non-Medical): No  Physical Activity: Unknown (09/12/2021)   Exercise Vital Sign    Days of Exercise per Week: 0 days    Minutes of Exercise per Session: Not on file  Stress: No Stress Concern Present (09/12/2021)   [place]    Feeling of Stress : Only a little  Social Connections: Socially Isolated (09/12/2021)   Social Connection and Isolation Panel [NHANES]    Frequency of Communication with Friends and Family: More than three times a week    Frequency of Social Gatherings with Friends and Family: Once a week    Attends Religious Services: Never    Marine scientist or Organizations: No    Attends Archivist Meetings: Never    Marital Status: Never married   Lives in a single-family home is 67 years old.  There are no roaches in the house ability. 4.  No dust mite precautions on bed or pillows.  He had been exposed to chemicals and fumes at his job is retired Nurse, learning disability.  He does not have a HEPA filter in the home and home is not near an interstate industrial area no exposure. Smoking: no exposure. Occupation: Retired Cabin crew History: Environmental education officer in the house: no Charity fundraiser in the family room: yes Carpet in the bedroom: yes Heating: heat pump Cooling: heat pump Pet: no  Family History: Family History  Problem Relation Age of Onset   Hyperlipidemia Mother    Aortic stenosis Mother    Valvular heart disease Mother    Colon cancer Mother    Diabetes Mellitus II Mother    Cancer - Colon Mother    Cancer Mother    Stroke Mother    Cerebrovascular Accident Mother    Diabetes  Mother    Hypertension Mother    Hyperlipidemia Father    Diabetes Mellitus II Father     Diabetes Father    Alzheimer's disease Father    Esophageal cancer Paternal Uncle    CAD Maternal Grandmother    Congestive Heart Failure Maternal Grandmother    Heart attack Maternal Grandmother    Heart failure Maternal Grandmother    Coronary artery disease Maternal Grandmother    Hyperlipidemia Maternal Grandmother      ROS: All others negative except as noted per HPI.   Objective: BP 136/82   Pulse (!) 105   Resp 16   Ht 6' (1.829 m)   Wt 203 lb 12.8 oz (92.4 kg)   SpO2 98%   BMI 27.64 kg/m  Body mass index is 27.64 kg/m.  General Appearance:  Alert, cooperative, no distress, appears stated age  Head:  Normocephalic, without obvious abnormality, atraumatic  Eyes:  Conjunctiva clear, EOM's intact  Nose: Nares normal,  erythematous nasal mucosa, hypertrophic turbinates, no visible anterior polyps, and septum midline  Throat: Lips, tongue normal; teeth and gums normal, + cobblestoning  Neck: Supple, symmetrical  Lungs:   Rhonchi heard bilaterally, good air movement, no wheezing or rails , Respirations unlabored, intermittent dry coughing  Heart:  regular rate and rhythm and no murmur, Appears well perfused  Extremities: No edema  Skin: Skin color, texture, turgor normal, no rashes or lesions on visualized portions of skin  Neurologic: No gross deficits   The plan was reviewed with the patient/family, and all questions/concerned were addressed.  It was my pleasure to see Joe Arroyo today and participate in his care. Please feel free to contact me with any questions or concerns.  Sincerely,  Roney Marion, MD Allergy & Immunology  [facility] office: [phone] [facility] office: [phone]

## 2022-05-02 NOTE — Patient Instructions (Addendum)
Breathing tests today showed mild restrictive pattern with no significant postbronchodilator response Allergy testing today showed positive to grass pollen and weed pollen   I think your dyspnea is multifactorial.  I would continue to follow with with cardiology and pulmonary for work up and management of other pulmonary and cardiac causes for dyspnea.   Consider stopping inhalers as they have not helped and this is no evidence of inflammation in your lungs  At this point some of your symptoms may be referred from your upper airway (nose and sinuses) and we will address these today.   Seasonal and  Rhinitis not well controlled: - STOP  Afrin use, this can cause worsening of any nose symptoms  - We will get sinus CT to evaluate for chronic sinus disease  - consider allergy shots as long term control of your symptoms by teaching your immune system to be more tolerant of your allergy triggers - Start Nasal Steroid Spray: Options include Flonase (fluticasone), Nasocort (triamcinolone), Nasonex (mometasome) 1- 2 sprays in each nostril daily (can buy over-the-counter if not covered by insurance)  Best results if used daily. - Start Astelin (Azelastine) 1-2 sprays in each nostril twice a day as needed.  You may use this as needed for nasal congestion/itchy ears/itchy nose if desired - Start Atrovent (Ipratropium Bromide) 1-2 sprays in each nostril up to 3 times a day as needed for runny nose/post nasal drip/drainage.  Use less frequently if airway gets too dry. - Start Singulair (Montelukast) '10mg'$  nightly. - Start over the counter antihistamine daily or daily as needed.   -Your options include Zyrtec (Cetirizine) '10mg'$ , Claritin (Loratadine) '10mg'$ , Allegra (Fexofenadine) '180mg'$ , or Xyzal (Levocetirinze) '5mg'$   Follow up: 4 weeks   Thank you so much for letting me partake in your care today.  Don't hesitate to reach out if you have any additional concerns!  Roney Marion, MD  [facility] [place], [place]  Reducing Pollen Exposure  The American Academy of Allergy, Asthma and Immunology suggests the following steps to reduce your exposure to pollen during allergy seasons.    Do not hang sheets or clothing out to dry; pollen may collect on these items. Do not mow lawns or spend time around freshly cut grass; mowing stirs up pollen. Keep windows closed at night.  Keep car windows closed while driving. Minimize morning activities outdoors, a time when pollen counts are usually at their highest. Stay indoors as much as possible when pollen counts or humidity is high and on windy days when pollen tends to remain in the air longer. Use air conditioning when possible.  Many air conditioners have filters that trap the pollen spores. Use a HEPA room air filter to remove pollen form the indoor air you breathe.

## 2022-05-02 NOTE — Telephone Encounter (Signed)
Sinus CT has been scheduled at:  [facility]  Appointment Date: 05/09/2022   Please Arrive at 10:15 for registration  Appointment Time: 10: 70   Prior Authorization approved  Authorization # [id number]  Exp date: 10/29/2022   Patient informed.

## 2022-05-09 DIAGNOSIS — J3489 Other specified disorders of nose and nasal sinuses: Secondary | ICD-10-CM | POA: Diagnosis not present

## 2022-05-09 DIAGNOSIS — J324 Chronic pansinusitis: Secondary | ICD-10-CM | POA: Diagnosis not present

## 2022-05-09 DIAGNOSIS — J342 Deviated nasal septum: Secondary | ICD-10-CM | POA: Diagnosis not present

## 2022-05-16 ENCOUNTER — Other Ambulatory Visit: Payer: Self-pay | Admitting: Family Medicine

## 2022-05-16 ENCOUNTER — Encounter: Payer: Self-pay | Admitting: *Deleted

## 2022-05-16 ENCOUNTER — Other Ambulatory Visit: Payer: Self-pay

## 2022-05-16 ENCOUNTER — Other Ambulatory Visit: Payer: Self-pay | Admitting: Internal Medicine

## 2022-05-16 MED ORDER — PREDNISONE 20 MG PO TABS: ORAL_TABLET | ORAL | 0 refills | Status: DC

## 2022-05-16 MED ORDER — DOXYCYCLINE MONOHYDRATE 100 MG PO TABS: 100.0000 mg | ORAL_TABLET | Freq: Two times a day (BID) | ORAL | 0 refills | Status: AC

## 2022-05-28 NOTE — Progress Notes (Unsigned)
Cardiology Office Note:    Date:  05/29/2022   ID:  Joe Arroyo, DOB Feb 02, 1956, MRN [id number]  PCP:  [name], MD  Cardiologist:  Shirlee More, MD    Referring MD: [name], MD    ASSESSMENT:    1. Hypertensive heart disease without heart failure   2. Sinus tachycardia   3. Coronary artery disease involving native coronary artery of native heart with other form of angina pectoris ([place])   4. Mixed hyperlipidemia   5. Ascending aorta enlargement (HCC)    PLAN:    In order of problems listed above:  Is a very difficult case where his predominant problem is shortness of breath CT scan showed emphysema showed scarring in the lungs he is seeing 2 of pulmonary physicians tells me that he is on improved significantly with bronchodilators he is seeing allergy tomorrow and by physical exam this seems to be an upper airway I think he would benefit from seeing ENT wants to discuss this with allergy tomorrow I reviewed the note from the pulmonary physician [facility] who recommended ENT evaluation when seen last summer. From the ENT consultation: Does have flattening on his inspiratory flow volume loop, he will see ENT locally though referral is in place if he wants to see ENT here.  Tachycardia is likely influenced by the shortness of breath bronchodilators and amlodipine therapy.  I do not think he requires beta-blocker and continue his calcium channel CAD he has had PCI and stent is not having typical angina at this time I would not refer him for repeat coronary angiography Continue his statin He will need a follow-up CT in about 6 months   Next appointment: 3 months   Medication Adjustments/Labs and Tests Ordered: Current medicines are reviewed at length with the patient today.  Concerns regarding medicines are outlined above.  No orders of the defined types were placed in this encounter.  No orders of the defined types were placed in this encounter.   Chief  Complaint  Patient presents with   Follow-up   Tachycardia   Hypertension    History of Present Illness:    Joe Arroyo is a 67 y.o. male with a hx of CAD with PCI and stent posterolateral branch right coronary artery 10/07/2021 resistant hypertension aneurysm ascending aorta mild aortic regurgitation and obstructive sleep apnea last seen 08/29/2021 with unexplained sinus tachycardia following COVID-19 infection.  He was admitted to [facility] 03/31/2022 discharged 04/02/2022 his admission diagnosis was malignant hypertension.  Blood pressure was recorded at 179 113 heart rate of 123 bpm on admission but not having evidence of endorgan damage.  Chest CT showed central lobular pneumonia with scarring at both lung bases.  In the hospital he was given IV hydration.  Notation his blood pressure improved he was given a new prescription for clonidine 0.2 mg in the morning and in the evening and amlodipine was added to his medical regimen.  Laboratory studies with a hemoglobin of 12.1 creatinine 1.0 potassium 4.7 troponin was normal no evidence of acute coronary syndrome cholesterol 118 with an LDL of 41.8 stable enlargement of the ascending aorta he did not have a proBNP level performed.  His EKG showed sinus rhythm and was normal the ascending aorta was 43 mm maximum diameter.  Compliance with diet, lifestyle and medications: Yes  He was unaware that he had emphysema on his CT scan His predominant complaint is shortness of breath with any activity wheezing but he has not used his  bronchodilator in 2 days He is seeing allergy tomorrow I asked him if he has had any ENT exam he has not he will discuss with the allergy physician tomorrow I think he would benefit His home blood pressures have been improved most common readings that he gets at home 138/84. He is now hands resting tachycardia at 110 bpm Every week or 2 he has an episode of nonexertional chest pain and can be very prolonged on  relieved with rest 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   Shortness of breath 09/05/2019   Sinus bradycardia 05/14/2018   Syncope 09/05/2019   Thoracic ascending aortic aneurysm (HCC)    ascending TAA 4.6 x 4.4 cm 10/03/20 CTA chest    Past Surgical History:  Procedure Laterality Date   COLONOSCOPY     CORONARY STENT INTERVENTION N/A 10/07/2021   Procedure: CORONARY STENT INTERVENTION;  Surgeon: Leonie Man, MD;  Location: [place] [facility];  Service: Cardiovascular;  Laterality: N/A;   ESOPHAGOGASTRODUODENOSCOPY N/A 12/05/2020   Procedure: ESOPHAGOGASTRODUODENOSCOPY (EGD);  Surgeon: Lajuana Matte, MD;  Location: [facility] OR;  Service: Thoracic;  Laterality: N/A;   LAMINECTOMY     MOLE REMOVAL     PROSTATE BIOPSY     RIGHT/LEFT HEART CATH AND CORONARY ANGIOGRAPHY N/A 10/07/2021   Procedure: RIGHT/LEFT HEART CATH AND CORONARY ANGIOGRAPHY;  Surgeon: Leonie Man, MD;  Location: [place] [facility];  Service: Cardiovascular;  Laterality: N/A;   TONSILLECTOMY AND ADENOIDECTOMY     UPPER GASTROINTESTINAL ENDOSCOPY     XI ROBOTIC ASSISTED PARAESOPHAGEAL HERNIA REPAIR N/A 12/05/2020   Procedure: XI ROBOTIC ASSISTED PARAESOPHAGEAL HERNIA REPAIR AND FUNDOPLICATION;  Surgeon: Lajuana Matte, MD;  Location: [place];  Service: Thoracic;  Laterality: N/A;    Current Medications: Current Meds  Medication Sig   amLODipine (NORVASC) 2.5 MG tablet TAKE ONE TABLET BY MOUTH EVERY DAY FOR BLOOD  PRESSURE   azelastine (ASTELIN) 0.1 % nasal spray 1-2 sprays in each nostril twice a day as needed   BREO ELLIPTA 100-25 MCG/ACT AEPB Inhale 1 puff into the lungs daily.   cloNIDine (CATAPRES) 0.3 MG tablet Take 1 tablet (0.3 mg total) by mouth daily.   clopidogrel (PLAVIX) 75 MG tablet TAKE ONE TABLET BY MOUTH DAILY   doxycycline (ADOXA) 100 MG tablet Take 1 tablet (100 mg total) by mouth 2 (two) times daily for 21 days.   esomeprazole (NEXIUM) 10 MG packet Take 10 mg by mouth daily before breakfast.   ezetimibe (ZETIA) 10 MG tablet Take 1 tablet (10 mg total) by mouth daily.   finasteride (PROSCAR) 5 MG tablet Take 5 mg by mouth daily.   fluticasone (FLONASE) 50 MCG/ACT nasal spray 1-2 sprays in each nostril daily   furosemide (LASIX) 40 MG tablet Take 1 tablet (40 mg total) by mouth 2 (two) times daily.   ipratropium (ATROVENT) 0.06 % nasal spray 1-2 sprays in each nostril up to 3 times a day as needed for runny nose/post nasal drip/drainage  levocetirizine (XYZAL) 5 MG tablet Take 1 tablet (5 mg total) by mouth every evening.   meloxicam (MOBIC) 15 MG tablet Take 1 tablet (15 mg total) by mouth daily as needed. (Patient taking differently: Take 15 mg by mouth daily as needed for pain.)   montelukast (SINGULAIR) 10 MG tablet Take 1 tablet (10 mg total) by mouth at bedtime.   Multiple Vitamins-Minerals (CENTRUM SILVER ULTRA MENS PO) Take 1 tablet by mouth daily.   Omega-3 Fatty Acids (FISH OIL PO) Take 1 tablet by mouth 2 (two) times daily.   predniSONE (DELTASONE) 20 MG tablet Take prednisone 20 mg twice a day by mouth for 5 days, then take prednisone 20 mg by mouth once a day for 5 days then stop   rosuvastatin (CRESTOR) 40 MG tablet Take 1 tablet (40 mg total) by mouth daily.     Allergies:   Nitroglycerin, Beta adrenergic blockers, Bystolic [nebivolol hcl], Carvedilol, Pravastatin, Tamsulosin, Hctz [hydrochlorothiazide], Hydralazine, and Penicillins   Social History   Socioeconomic  History   Marital status: Single    Spouse name: Not on file   Number of children: Not on file   Years of education: Not on file   Highest education level: Not on file  Occupational History   Not on file  Tobacco Use   Smoking status: Never   Smokeless tobacco: Never  Vaping Use   Vaping Use: Never used  Substance and Sexual Activity   Alcohol use: Yes    Comment: occasional   Drug use: Never   Sexual activity: Not on file  Other Topics Concern   Not on file  Social History Narrative   Not on file   Social Determinants of Health   Financial Resource Strain: Low Risk  (09/12/2021)   Overall Financial Resource Strain (CARDIA)    Difficulty of Paying Living Expenses: Not hard at all  Food Insecurity: No Food Insecurity (04/03/2022)   Hunger Vital Sign    Worried About Running Out of Food in the Last Year: Never true    Ran Out of Food in the Last Year: Never true  Transportation Needs: No Transportation Needs (04/03/2022)   PRAPARE - Hydrologist (Medical): No    Lack of Transportation (Non-Medical): No  Physical Activity: Unknown (09/12/2021)   Exercise Vital Sign    Days of Exercise per Week: 0 days    Minutes of Exercise per Session: Not on file  Stress: No Stress Concern Present (09/12/2021)   [place]    Feeling of Stress : Only a little  Social Connections: Socially Isolated (09/12/2021)   Social Connection and Isolation Panel [NHANES]    Frequency of Communication with Friends and Family: More than three times a week    Frequency of Social Gatherings with Friends and Family: Once a week    Attends Religious Services: Never    Marine scientist or Organizations: No    Attends Music therapist: Never    Marital Status: Never married     Family History: The patient's family history includes Alzheimer's disease in his father; Aortic stenosis in his mother;  CAD in his maternal grandmother; Cancer in his mother; Cancer - Colon in his mother; Cerebrovascular Accident in his mother; Colon cancer in his mother; Congestive Heart Failure in his maternal grandmother; Coronary artery disease in his maternal grandmother; Diabetes in his father and mother; Diabetes Mellitus II in his father and  mother; Esophageal cancer in his paternal uncle; Heart attack in his maternal grandmother; Heart failure in his maternal grandmother; Hyperlipidemia in his father, maternal grandmother, and mother; Hypertension in his mother; Stroke in his mother; Valvular heart disease in his mother. ROS:   Please see the history of present illness.    All other systems reviewed and are negative.  EKGs/Labs/Other Studies Reviewed:    The following studies were reviewed today:  EKG:  EKG ordered today and personally reviewed.  The ekg ordered today demonstrates sinus rhythm rightward axis resting heart rate 110 bpm.  Recent Labs: 11/14/2021: NT-Pro BNP 67 02/06/2022: BUN 10; Creatinine, Ser 1.09; Hemoglobin 13.4; Platelets 210; Potassium 4.0; Sodium 138  Recent Lipid Panel    Component Value Date/Time   CHOL 141 04/17/2021 0944   TRIG 99 04/17/2021 0944   HDL 57 04/17/2021 0944   CHOLHDL 2.5 04/17/2021 0944   LDLCALC 66 04/17/2021 0944    Physical Exam:    VS:  BP 134/82 (BP Location: Right Arm, Patient Position: Sitting)   Pulse (!) 110   Ht 6' (1.829 m)   Wt 202 lb (91.6 kg)   SpO2 97%   BMI 27.40 kg/m     Wt Readings from Last 3 Encounters:  05/29/22 202 lb (91.6 kg)  05/02/22 203 lb 12.8 oz (92.4 kg)  03/31/22 205 lb (93 kg)     GEN:  Well nourished, well developed in no acute distress HEENT: Normal NECK: No JVD; No carotid bruits LYMPHATICS: No lymphadenopathy CARDIAC: RRR, no murmurs, rubs, gallops RESPIRATORY:  Clear to auscultation without rales, wheezing or rhonchi he has wheezing which seems to be upper airway ABDOMEN: Soft, non-tender,  non-distended MUSCULOSKELETAL:  No edema; No deformity  SKIN: Warm and dry NEUROLOGIC:  Alert and oriented x 3 PSYCHIATRIC:  Normal affect   Seen with Glade Lloyd exercise stress technologist chaperone    Signed, Shirlee More, MD  05/29/2022 9:45 AM    [place]

## 2022-05-29 ENCOUNTER — Ambulatory Visit: Payer: Medicare HMO | Attending: Cardiology | Admitting: Cardiology

## 2022-05-29 ENCOUNTER — Encounter: Payer: Self-pay | Admitting: Cardiology

## 2022-05-29 VITALS — BP 134/82 | HR 110 | Ht 72.0 in | Wt 202.0 lb

## 2022-05-29 DIAGNOSIS — I119 Hypertensive heart disease without heart failure: Secondary | ICD-10-CM | POA: Diagnosis not present

## 2022-05-29 DIAGNOSIS — E782 Mixed hyperlipidemia: Secondary | ICD-10-CM

## 2022-05-29 DIAGNOSIS — R000 Tachycardia, unspecified: Secondary | ICD-10-CM

## 2022-05-29 DIAGNOSIS — I7789 Other specified disorders of arteries and arterioles: Secondary | ICD-10-CM

## 2022-05-29 DIAGNOSIS — I25118 Atherosclerotic heart disease of native coronary artery with other forms of angina pectoris: Secondary | ICD-10-CM | POA: Diagnosis not present

## 2022-05-29 NOTE — Patient Instructions (Signed)
Medication Instructions:  Your physician recommends that you continue on your current medications as directed. Please refer to the Current Medication list given to you today.  *If you need a refill on your cardiac medications before your next appointment, please call your pharmacy*   Lab Work: None If you have labs (blood work) drawn today and your tests are completely normal, you will receive your results only by: [place] (if you have MyChart) OR A paper copy in the mail If you have any lab test that is abnormal or we need to change your treatment, we will call you to review the results.   Testing/Procedures: None   Follow-Up: At [facility], you and your health needs are our priority.  As part of our continuing mission to provide you with exceptional heart care, we have created designated Provider Care Teams.  These Care Teams include your primary Cardiologist (physician) and Advanced Practice Providers (APPs -  Physician Assistants and Nurse Practitioners) who all work together to provide you with the care you need, when you need it.  We recommend signing up for the patient portal called "MyChart".  Sign up information is provided on this After Visit Summary.  MyChart is used to connect with patients for Virtual Visits (Telemedicine).  Patients are able to view lab/test results, encounter notes, upcoming appointments, etc.  Non-urgent messages can be sent to your provider as well.   To learn more about what you can do with MyChart, go to [email or web address].    Your next appointment:   3 month(s)  Provider:   Shirlee More, MD    Other Instructions None  This visit was accompanied by Glade Lloyd

## 2022-05-29 NOTE — Addendum Note (Signed)
Addended by: Edwyna Shell I on: 05/29/2022 10:14 AM   Modules accepted: Orders

## 2022-05-30 ENCOUNTER — Ambulatory Visit: Payer: Medicare HMO | Admitting: Internal Medicine

## 2022-05-30 ENCOUNTER — Encounter: Payer: Self-pay | Admitting: Internal Medicine

## 2022-05-30 VITALS — BP 110/82 | HR 110 | Resp 18

## 2022-05-30 DIAGNOSIS — I119 Hypertensive heart disease without heart failure: Secondary | ICD-10-CM | POA: Diagnosis not present

## 2022-05-30 DIAGNOSIS — R0609 Other forms of dyspnea: Secondary | ICD-10-CM | POA: Diagnosis not present

## 2022-05-30 DIAGNOSIS — K219 Gastro-esophageal reflux disease without esophagitis: Secondary | ICD-10-CM | POA: Diagnosis not present

## 2022-05-30 DIAGNOSIS — I7121 Aneurysm of the ascending aorta, without rupture: Secondary | ICD-10-CM

## 2022-05-30 DIAGNOSIS — J3089 Other allergic rhinitis: Secondary | ICD-10-CM

## 2022-05-30 DIAGNOSIS — J328 Other chronic sinusitis: Secondary | ICD-10-CM | POA: Diagnosis not present

## 2022-05-30 DIAGNOSIS — I2510 Atherosclerotic heart disease of native coronary artery without angina pectoris: Secondary | ICD-10-CM | POA: Diagnosis not present

## 2022-05-30 DIAGNOSIS — G4733 Obstructive sleep apnea (adult) (pediatric): Secondary | ICD-10-CM | POA: Diagnosis not present

## 2022-05-30 DIAGNOSIS — K449 Diaphragmatic hernia without obstruction or gangrene: Secondary | ICD-10-CM | POA: Diagnosis not present

## 2022-05-30 NOTE — Patient Instructions (Addendum)
Breathing tests previously showed mild restrictive pattern with no significant postbronchodilator response Allergy testing previously showed positive to grass pollen and weed pollen  CT Scan : mild CRS and deviated septum   Continue to follow with pulmonary and cardiology   Seasonal allergic Rhinitis with chronic sinusitis   - continue antibiotics as prescribed for chronic sinusitis  - restart allergy meds as below  - Continue allergy avoidance measures to pollen   - Start Nasal Steroid Spray: Options include Flonase (fluticasone), Nasocort (triamcinolone), Nasonex (mometasome) 1- 2 sprays in each nostril daily (can buy over-the-counter if not covered by insurance)  Best results if used daily. - Start Astelin (Azelastine) 1-2 sprays in each nostril twice a day as needed.  You may use this as needed for nasal congestion/itchy ears/itchy nose if desired - Start Atrovent (Ipratropium Bromide) 1-2 sprays in each nostril up to 3 times a day as needed for runny nose/post nasal drip/drainage.  Use less frequently if airway gets too dry. - Start Singulair (Montelukast) 30m nightly. - Start over the counter antihistamine daily or daily as needed.   -Your options include Zyrtec (Cetirizine) 174m Claritin (Loratadine) 1021mAllegra (Fexofenadine) 180m38mr Xyzal (Levocetirinze) 5mg 65mllow up: 3 months  Thank you so much for letting me partake in your care today.  Don't hesitate to reach out if you have any additional concerns!  [facility]

## 2022-05-30 NOTE — Progress Notes (Signed)
Follow Up Note  RE: Joe Arroyo MRN: [id number] DOB: 1956-01-22 Date of Office Visit: 05/30/2022  Referring provider: [name], MD Primary care provider: [name], MD  Chief Complaint: Allergies, Shortness of Breath, and Gastroesophageal Reflux  History of Present Illness: I had the pleasure of seeing Joe Arroyo for a follow up visit at the [facility] on 05/30/2022. He is a 67 y.o. male with complicated cardiopulmonary history who is being followed for CRS, allergic rhinitis. His previous allergy office visit was on 05/02/22 with Dr. Edison Pace. Today is a regular follow up visit.  History obtained from patient, chart review.  Last visit patient was seen to family PT which did show mild CRS and s shaped septal deviation.  He was treated with 10 days of prednisone and 21 days of doxycycline.  Today he reports overall improvement in cough, postnasal drip, wheezing.  He has not fully completed his antibiotics yet.  He has stopped all of his inhalers and did not notice any worsening of his symptoms.  Due to concern of drug drug interaction he also stopped all of his allergy nasal sprays and medications for unclear reasons.  He is willing to restart it.  He had appointment with cardiologist yesterday who requested copy of the sinus CT.  Overall he is happy with the results.  Please see previous note from 04/21/2022 for full summary of cardiac and pulmonary workup.  Assessment and Plan: Keiran is a 67 y.o. male with: Other chronic sinusitis  Other allergic rhinitis  Dyspnea on exertion  Hypertensive heart disease without heart failure  Coronary artery disease involving native coronary artery of native heart without angina pectoris  Aneurysm of ascending aorta without rupture (HCC)  OSA (obstructive sleep apnea)  Gastroesophageal reflux disease without esophagitis  Hiatal hernia   Plan: Patient Instructions  Breathing tests previously showed mild  restrictive pattern with no significant postbronchodilator response Allergy testing previously showed positive to grass pollen and weed pollen  CT Scan : mild CRS and deviated septum   Continue to follow with pulmonary and cardiology   Seasonal allergic Rhinitis with chronic sinusitis   - continue antibiotics as prescribed for chronic sinusitis  - restart allergy meds as below  - Continue allergy avoidance measures to pollen   - Start Nasal Steroid Spray: Options include Flonase (fluticasone), Nasocort (triamcinolone), Nasonex (mometasome) 1- 2 sprays in each nostril daily (can buy over-the-counter if not covered by insurance)  Best results if used daily. - Start Astelin (Azelastine) 1-2 sprays in each nostril twice a day as needed.  You may use this as needed for nasal congestion/itchy ears/itchy nose if desired - Start Atrovent (Ipratropium Bromide) 1-2 sprays in each nostril up to 3 times a day as needed for runny nose/post nasal drip/drainage.  Use less frequently if airway gets too dry. - Start Singulair (Montelukast) 17m nightly. - Start over the counter antihistamine daily or daily as needed.   -Your options include Zyrtec (Cetirizine) 137m Claritin (Loratadine) 1081mAllegra (Fexofenadine) 180m51mr Xyzal (Levocetirinze) 5mg 13mllow up: 3 months  Thank you so much for letting me partake in your care today.  Don't hesitate to reach out if you have any additional concerns!  Ev[facility]Roney [facility] [place] [place]    No orders of the defined types were placed in this encounter.   Lab Orders  No laboratory test(s) ordered today   Diagnostics: None   Medication List:  Current Outpatient  Medications  Medication Sig Dispense Refill   amLODipine (NORVASC) 2.5 MG tablet TAKE ONE TABLET BY MOUTH EVERY DAY FOR BLOOD PRESSURE 90 tablet 1   azelastine (ASTELIN) 0.1 % nasal spray 1-2 sprays in each nostril twice a day as needed 30 mL 5   BREO ELLIPTA  100-25 MCG/ACT AEPB Inhale 1 puff into the lungs daily.     cloNIDine (CATAPRES) 0.3 MG tablet Take 1 tablet (0.3 mg total) by mouth daily. 90 tablet 1   clopidogrel (PLAVIX) 75 MG tablet TAKE ONE TABLET BY MOUTH DAILY 90 tablet 1   doxycycline (ADOXA) 100 MG tablet Take 1 tablet (100 mg total) by mouth 2 (two) times daily for 21 days. 42 tablet 0   esomeprazole (NEXIUM) 10 MG packet Take 10 mg by mouth daily before breakfast.     ezetimibe (ZETIA) 10 MG tablet Take 1 tablet (10 mg total) by mouth daily. 90 tablet 1   finasteride (PROSCAR) 5 MG tablet Take 5 mg by mouth daily.     fluticasone (FLONASE) 50 MCG/ACT nasal spray 1-2 sprays in each nostril daily 16 g 5   furosemide (LASIX) 40 MG tablet Take 1 tablet (40 mg total) by mouth 2 (two) times daily. 180 tablet 3   ipratropium (ATROVENT) 0.06 % nasal spray 1-2 sprays in each nostril up to 3 times a day as needed for runny nose/post nasal drip/drainage 15 mL 5   levocetirizine (XYZAL) 5 MG tablet Take 1 tablet (5 mg total) by mouth every evening. 30 tablet 5   meloxicam (MOBIC) 15 MG tablet Take 1 tablet (15 mg total) by mouth daily as needed. (Patient taking differently: Take 15 mg by mouth daily as needed for pain.) 90 tablet 0   montelukast (SINGULAIR) 10 MG tablet Take 1 tablet (10 mg total) by mouth at bedtime. 30 tablet 5   Multiple Vitamins-Minerals (CENTRUM SILVER ULTRA MENS PO) Take 1 tablet by mouth daily.     Omega-3 Fatty Acids (FISH OIL PO) Take 1 tablet by mouth 2 (two) times daily.     rosuvastatin (CRESTOR) 40 MG tablet Take 1 tablet (40 mg total) by mouth daily. 90 tablet 0   No current facility-administered medications for this visit.   Allergies: Allergies  Allergen Reactions   Nitroglycerin Other (See Comments)    Severe hypotension, caused 100 point drop in systolic BP   Beta Adrenergic Blockers Other (See Comments)    bradycardia   Bystolic [Nebivolol Hcl] Other (See Comments)    bradycardia   Carvedilol Other  (See Comments)    bradycardia   Pravastatin Other (See Comments)    "made my joints hurt"   Tamsulosin Other (See Comments)    Syncope   Hctz [Hydrochlorothiazide] Rash   Hydralazine Rash   Penicillins Rash   I reviewed his past medical history, social history, family history, and environmental history and no significant changes have been reported from his previous visit.  ROS: All others negative except as noted per HPI.   Objective: BP 110/82   Pulse (!) 110   Resp 18   SpO2 93%  There is no height or weight on file to calculate BMI. General Appearance:  Alert, cooperative, no distress, appears stated age  Head:  Normocephalic, without obvious abnormality, atraumatic  Eyes:  Conjunctiva clear, EOM's intact  Nose: Nares normal, normal mucosa, no visible anterior polyps, and septum midline  Throat: Lips, tongue normal; teeth and gums normal, normal posterior oropharynx  Neck: Supple, symmetrical  Lungs:  clear to auscultation bilaterally, Respirations unlabored, no coughing  Heart:  regular rate and rhythm and no murmur, Appears well perfused  Extremities: No edema  Skin: Skin color, texture, turgor normal, no rashes or lesions on visualized portions of skin  Neurologic: No gross deficits   Previous notes and tests were reviewed. The plan was reviewed with the patient/family, and all questions/concerned were addressed.  It was my pleasure to see Trelyn today and participate in his care. Please feel free to contact me with any questions or concerns.  Sincerely,  Roney Marion, MD  Allergy & Immunology  [facility] [place] [facility] Office: [phone]

## 2022-06-03 DIAGNOSIS — R339 Retention of urine, unspecified: Secondary | ICD-10-CM | POA: Diagnosis not present

## 2022-06-03 DIAGNOSIS — C61 Malignant neoplasm of prostate: Secondary | ICD-10-CM | POA: Diagnosis not present

## 2022-06-03 DIAGNOSIS — Z87442 Personal history of urinary calculi: Secondary | ICD-10-CM | POA: Diagnosis not present

## 2022-06-05 ENCOUNTER — Other Ambulatory Visit: Payer: Self-pay

## 2022-06-05 MED ORDER — EZETIMIBE 10 MG PO TABS: 10.0000 mg | ORAL_TABLET | Freq: Every day | ORAL | 1 refills | Status: DC

## 2022-06-19 DIAGNOSIS — R0600 Dyspnea, unspecified: Secondary | ICD-10-CM | POA: Diagnosis not present

## 2022-06-19 DIAGNOSIS — R09A2 Foreign body sensation, throat: Secondary | ICD-10-CM | POA: Diagnosis not present

## 2022-06-19 DIAGNOSIS — J342 Deviated nasal septum: Secondary | ICD-10-CM | POA: Diagnosis not present

## 2022-06-19 DIAGNOSIS — J45909 Unspecified asthma, uncomplicated: Secondary | ICD-10-CM | POA: Diagnosis not present

## 2022-06-19 DIAGNOSIS — R079 Chest pain, unspecified: Secondary | ICD-10-CM | POA: Diagnosis not present

## 2022-06-19 DIAGNOSIS — I2510 Atherosclerotic heart disease of native coronary artery without angina pectoris: Secondary | ICD-10-CM | POA: Diagnosis not present

## 2022-06-30 ENCOUNTER — Other Ambulatory Visit: Payer: Self-pay

## 2022-06-30 MED ORDER — CLONIDINE HCL 0.3 MG PO TABS: 0.3000 mg | ORAL_TABLET | Freq: Every day | ORAL | 1 refills | Status: DC

## 2022-08-07 ENCOUNTER — Other Ambulatory Visit: Payer: Self-pay

## 2022-08-07 DIAGNOSIS — E782 Mixed hyperlipidemia: Secondary | ICD-10-CM

## 2022-08-07 MED ORDER — ROSUVASTATIN CALCIUM 40 MG PO TABS: 40.0000 mg | ORAL_TABLET | Freq: Every day | ORAL | 0 refills | Status: DC

## 2022-08-25 ENCOUNTER — Other Ambulatory Visit: Payer: Self-pay

## 2022-09-02 NOTE — Progress Notes (Unsigned)
Cardiology Office Note:    Date:  09/03/2022   ID:  Joe Arroyo, DOB [birth year], MRN [id number]  PCP:  Blane Ohara, MD  Cardiologist:  Norman Herrlich, MD    Referring MD: Blane Ohara, MD    ASSESSMENT:    1. Shortness of breath   2. Coronary artery disease involving native coronary artery of native heart with other form of angina pectoris (HCC)   3. Hypertensive heart disease without heart failure   4. Sinus tachycardia   5. Mixed hyperlipidemia   6. Ascending aorta enlargement (HCC)    PLAN:    In order of problems listed above:  He will have a cardiopulmonary stress test performed to sort out deconditioning cardiac or pulmonary etiology I think this is pulmonary Stable CAD continue medical therapy Compensated continue his current loop diuretic continue current to antihypertensive Continue with statin Follow up CTA later this year   Next appointment: 6 months   Medication Adjustments/Labs and Tests Ordered: Current medicines are reviewed at length with the patient today.  Concerns regarding medicines are outlined above.  Orders Placed This Encounter  Procedures   Comp Met (CMET)   Lipid Profile   Pro b natriuretic peptide (BNP)   6 minute walk   No orders of the defined types were placed in this encounter.   Chief complaint I continue to be short of breath   History of Present Illness:    Joe Arroyo is a 67 y.o. 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  The primary problem is shortness of breath he has emphysema on CT scan abnormal PFTs with flattening of his inspiratory flow volume loop and was referred to ENT for evaluation of his upper airway.He was seen by ENT and  flexible fiberoptic laryngoscopy described as unremarkable with no answer for his ongoing persistent disproportional shortness of breath.  Compliance with diet, lifestyle and medications: Yes  He is frustrated he is on improvement He has a variable pattern of shortness of breath some days good other days worse he has cough sputum production and wheezes Unimproved with bronchodilator Having chest pain He had extensive evaluation with 2 pulmonologist as well as an ENT evaluation. Previously we discussed a cardiopulmonary stress test to try to sort out the etiology of his shortness of breath I think his pulmonary deconditioning however he has gotten different opinions from pulmonary specialist Past Medical History:  Diagnosis Date   Abnormal cardiac CT angiography    Atypical chest pain 01/07/2021   Chronic idiopathic constipation 01/07/2021   Coronary artery disease    Dyspnea on exertion 09/05/2019   Essential hypertension, benign 05/14/2018   GERD (gastroesophageal reflux disease) 09/28/2020   Hiatal hernia 09/28/2020   History of hiatal hernia    History of kidney stones    Hyperlipidemia 05/14/2018   Hypertensive heart disease 05/14/2018   09/17/2021: SUMMARY  Severe single-vessel disease as indicated by Coronary CTA with RPL 2 having tandem 90% and 80% stenoses (CT FFR positive)-this vessel is at least the same size as the LAD  Successful DES PCI of RPL 2 covering both lesions with a Synergy DES 2.25 mm x 20 mm postdilated in tapered fashion from 2.5 to 2.3 mm.)  Otherwise minimal CAD with relatively small caliber left coronary syst  Non-seasonal allergic rhinitis due to pollen 08/16/2019   OSA (obstructive sleep apnea) 09/28/2020   Other chronic sinusitis 05/02/2022   Other fatigue 03/17/2022   Other seasonal allergic rhinitis 05/02/2022   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 OR;  Service: Thoracic;  Laterality: N/A;   LAMINECTOMY     MOLE REMOVAL     PROSTATE BIOPSY     RIGHT/LEFT HEART CATH AND CORONARY ANGIOGRAPHY N/A 10/07/2021   Procedure: RIGHT/LEFT HEART CATH AND CORONARY ANGIOGRAPHY;  Surgeon: Marykay Lex, MD;  Location: [facility] INVASIVE CV LAB;  Service: Cardiovascular;  Laterality: N/A;   TONSILLECTOMY AND ADENOIDECTOMY     UPPER GASTROINTESTINAL ENDOSCOPY     XI ROBOTIC ASSISTED PARAESOPHAGEAL HERNIA REPAIR N/A 12/05/2020   Procedure: XI ROBOTIC ASSISTED PARAESOPHAGEAL HERNIA REPAIR AND FUNDOPLICATION;  Surgeon: Corliss Skains, MD;  Location: [facility] OR;  Service: Thoracic;  Laterality: N/A;    Current Medications: Current Meds  Medication Sig   amLODipine (NORVASC) 2.5 MG tablet TAKE ONE TABLET BY MOUTH EVERY DAY FOR BLOOD PRESSURE   azelastine (ASTELIN) 0.1 % nasal spray 1-2 sprays in each nostril twice a day as needed   BREO ELLIPTA 100-25 MCG/ACT AEPB Inhale 1 puff into the lungs daily.   cloNIDine (CATAPRES) 0.3 MG tablet Take 1 tablet (0.3 mg total) by mouth daily.   clopidogrel (PLAVIX) 75 MG tablet TAKE ONE TABLET BY MOUTH DAILY   esomeprazole (NEXIUM) 10 MG packet Take 10 mg by mouth daily before breakfast.   ezetimibe (ZETIA) 10 MG tablet Take 1 tablet (10 mg total) by mouth daily.   finasteride (PROSCAR) 5 MG tablet Take 5 mg by mouth daily.   fluticasone (FLONASE) 50 MCG/ACT nasal spray 1-2 sprays in each nostril daily   furosemide (LASIX) 40 MG tablet Take 1 tablet (40  mg total) by mouth 2 (two) times daily.   ipratropium (ATROVENT) 0.06 % nasal spray 1-2 sprays in each nostril up to 3 times a day as needed for runny nose/post nasal drip/drainage   levocetirizine (XYZAL) 5 MG tablet Take 1 tablet (5 mg total) by mouth every evening.   magnesium oxide (MAG-OX) 400 (240 Mg) MG tablet Take 400 mg by mouth daily.   meloxicam (MOBIC) 15 MG tablet Take 1 tablet (15 mg total) by mouth daily as needed.   montelukast (SINGULAIR) 10 MG tablet Take 1 tablet (10 mg total) by mouth at bedtime.   Multiple Vitamins-Minerals (CENTRUM SILVER ULTRA MENS PO) Take 1 tablet by mouth daily.   Omega-3 Fatty Acids (FISH OIL PO) Take 1 tablet by mouth 2 (two) times daily.   rosuvastatin (CRESTOR) 40 MG tablet Take 1 tablet (40 mg total) by mouth daily.   zinc gluconate 50 MG tablet Take 50 mg by mouth daily.     Allergies:   Nitroglycerin, Beta adrenergic blockers, Bystolic [nebivolol hcl], Carvedilol, Pravastatin, Tamsulosin, Hctz [hydrochlorothiazide], Hydralazine, and Penicillins   Social History   Socioeconomic History  Marital status: Single    Spouse name: Not on file   Number of children: Not on file   Years of education: Not on file   Highest education level: Not on file  Occupational History   Not on file  Tobacco Use   Smoking status: Never   Smokeless tobacco: Never  Vaping Use   Vaping Use: Never used  Substance and Sexual Activity   Alcohol use: Yes    Comment: occasional   Drug use: Never   Sexual activity: Not on file  Other Topics Concern   Not on file  Social History Narrative   Not on file   Social Determinants of Health   Financial Resource Strain: Low Risk  (09/12/2021)   Overall Financial Resource Strain (CARDIA)    Difficulty of Paying Living Expenses: Not hard at all  Food Insecurity: No Food Insecurity (04/03/2022)   Hunger Vital Sign    Worried About Running Out of Food in the Last Year: Never true    Ran Out of Food in the Last Year:  Never true  Transportation Needs: No Transportation Needs (04/03/2022)   PRAPARE - Administrator, Civil Service (Medical): No    Lack of Transportation (Non-Medical): No  Physical Activity: Unknown (09/12/2021)   Exercise Vital Sign    Days of Exercise per Week: 0 days    Minutes of Exercise per Session: Not on file  Stress: No Stress Concern Present (09/12/2021)   [employer] of Occupational Health - Occupational Stress Questionnaire    Feeling of Stress : Only a little  Social Connections: Socially Isolated (09/12/2021)   Social Connection and Isolation Panel [NHANES]    Frequency of Communication with Friends and Family: More than three times a week    Frequency of Social Gatherings with Friends and Family: Once a week    Attends Religious Services: Never    Database administrator or Organizations: No    Attends Engineer, structural: Never    Marital Status: Never married     Family History: The patient's family history includes Alzheimer's disease in his father; Aortic stenosis in his mother; CAD in his maternal grandmother; Cancer in his mother; Cancer - Colon in his mother; Cerebrovascular Accident in his mother; Colon cancer in his mother; Congestive Heart Failure in his maternal grandmother; Coronary artery disease in his maternal grandmother; Diabetes in his father and mother; Diabetes Mellitus II in his father and mother; Esophageal cancer in his paternal uncle; Heart attack in his maternal grandmother; Heart failure in his maternal grandmother; Hyperlipidemia in his father, maternal grandmother, and mother; Hypertension in his mother; Stroke in his mother; Valvular heart disease in his mother. ROS:   Please see the history of present illness.    All other systems reviewed and are negative.  EKGs/Labs/Other Studies Reviewed:    The following studies were reviewed today:  Cardiac Studies & Procedures   CARDIAC CATHETERIZATION  CARDIAC CATHETERIZATION  10/07/2021  Narrative   CULPRIT LESION SEGMENT: 2nd RPL-1 lesion is 90% stenosed.  2nd RPL-2 lesion is 80% stenosed.  (FFR ct positive)   2nd RPL-2 lesion is 80% stenosed.   A drug-eluting stent was successfully placed covering both lesions using a SYNERGY XD 2.25X24.  Deployed to 2.3 mm postdilated proximally to 2.5 mm (tapered from 2.5 to 2.3 mm distally.)   Post intervention, there is a 0% residual stenosis across the entire segment..   Otherwise angiographically normal coronaries; Right Dominant System  There is no aortic valve stenosis.   Relatively normal RHC numbers   --------------------------------------------------------------  SUMMARY Severe single-vessel disease as indicated by Coronary CTA with RPL 2 having tandem 90% and 80% stenoses (CT FFR positive)-this vessel is at least the same size as the LAD Successful DES PCI of RPL 2 covering both lesions with a Synergy DES 2.25 mm x 20 mm postdilated in tapered fashion from 2.5 to 2.3 mm.) Otherwise minimal CAD with relatively small caliber left coronary system and large dominant RCA. Normal LVEF by Echo with mildly elevated LVEDP 18 mmHg Right Heart Cath Numbers: Mean RAP 5 mmHg, RVP-880 P 22/1-6 mmHg; PAP-main 23/8-15 mmHg, PCWP 11 mmHg; LVP-EDP 110/5-18 mmHg, AoP-MAP 109/58-82 mmHg; AO Sat 99%, PA Sat 70%--Fick CO-CI 4.91-2.33 (mildly reduced)  RECOMMENDATIONS Same-day discharge following RPL 2 PCI with DES Monitor closely for blood pressure-in future would recommend avoiding nitrates-had significant drop in blood pressure response IC NTG Not on beta-blocker because of bradycardia-heart rate in the 50s to low 60s.  Continue amlodipine. Increase rosuvastatin to 40 mg daily DAPT x6 months (ASA 81 mg/Plavix 75 mg); for next 6 months we will continue Plavix monotherapy without aspirin to complete 1 year.    Bryan Lemma, MD  Findings Coronary Findings Diagnostic  Dominance: Right  Left Main Vessel was injected. Vessel is  normal in caliber and large. Vessel is angiographically normal.  Left Anterior Descending Vessel was injected. Vessel is normal in caliber. Vessel is angiographically normal.  First Diagonal Branch Vessel is moderate in size.  First Septal Branch Vessel is small in size.  Second Septal Branch Vessel is small in size.  Left Circumflex Vessel was injected. Vessel is normal in caliber. Vessel is angiographically normal.  Right Coronary Artery Vessel was injected. Vessel is normal in caliber and large. Vessel is angiographically normal.  Acute Marginal Branch Vessel is small in size.  Right Ventricular Branch Vessel is small in size.  First Right Posterolateral Branch Vessel is moderate in size.  Second Right Posterolateral Branch Vessel is large in size. 2nd RPL-1 lesion is 90% stenosed. The lesion is distal to major branch and concentric. 2nd RPL-2 lesion is 80% stenosed.  Intervention  2nd RPL-1 lesion Stent (Also treats lesions: 2nd RPL-2) Lesion length:  22 mm. CATH LAUNCHER [id number] guide catheter was inserted. Lesion crossed with guidewire using a WIRE ASAHI PROWATER 180CM. Pre-stent angioplasty was performed using a BALLN SAPPHIRE 2.0X15. Maximum pressure:  10 atm. Inflation time:  2 sec. 2 inflations of each lesion A drug-eluting stent was successfully placed using a SYNERGY XD 2.25X24. Maximum pressure: 14 atm. Inflation time: 30 sec. Stent strut is well apposed. Deployed 2.3 mm and with proximal 2/3 of the stent postdilated 2.5 mm Post-stent angioplasty was performed using a BALL SAPPHIRE NC24 2.50X15. Maximum pressure:  16 atm. Inflation time:  20 sec. Proximal 2/3 of stent Tapered postdilation from 2.5 to 2.3 mm. Post-Intervention Lesion Assessment The intervention was successful. Pre-interventional TIMI flow is 3. Post-intervention TIMI flow is 3. Treated lesion length:  24 mm. No complications occurred at this lesion. There is a 0% residual stenosis post  intervention.  2nd RPL-2 lesion Stent (Also treats lesions: 2nd RPL-1) See details in 2nd RPL-1 lesion. Post-Intervention Lesion Assessment The intervention was successful. Pre-interventional TIMI flow is 3. Post-intervention TIMI flow is 3. Treated lesion length:  24 mm. No complications occurred at this lesion. There is a 0% residual stenosis post intervention.   STRESS TESTS  NM MYOCAR MULTI W/SPECT W  02/07/2022  Narrative CLINICAL DATA:  Chest pain, nonspecific anginal equivalent, history of prior revascularization  EXAM: MYOCARDIAL IMAGING WITH SPECT (REST AND PHARMACOLOGIC-STRESS)  GATED LEFT VENTRICULAR WALL MOTION STUDY  LEFT VENTRICULAR EJECTION FRACTION  TECHNIQUE: Standard myocardial SPECT imaging was performed after resting intravenous injection of 10 mCi Tc-73m tetrofosmin. Subsequently, intravenous infusion of Lexiscan was performed under the supervision of the Cardiology staff. At peak effect of the drug, 30 mCi Tc-19m tetrofosmin was injected intravenously and standard myocardial SPECT imaging was performed. Quantitative gated imaging was also performed to evaluate left ventricular wall motion, and estimate left ventricular ejection fraction.  COMPARISON:  CTA chest February 05, 2022.  FINDINGS: Perfusion: No decreased activity in the left ventricle on stress imaging to suggest reversible ischemia or infarction.  Wall Motion: Normal left ventricular wall motion. No left ventricular dilation.  Left Ventricular Ejection Fraction: 65 %  End diastolic volume 93 ml  End systolic volume 31 ml  IMPRESSION: 1. No reversible ischemia or infarction.  2. Normal left ventricular wall motion.  3. Left ventricular ejection fraction 65%  4. Non invasive risk stratification*: Low  *2012 Appropriate Use Criteria for Coronary Revascularization Focused Update: J Am Coll Cardiol.  2012;59(9):857-881. http://content.dementiazones.com.aspx?articleid=1201161   Electronically Signed By: Maudry Mayhew M.D. On: 02/07/2022 09:19   ECHOCARDIOGRAM  ECHOCARDIOGRAM COMPLETE 02/06/2022  Narrative ECHOCARDIOGRAM REPORT    Patient Name:   WEBB BART Date of Exam: 02/06/2022 Medical Rec #:  [id number]     Height:       72.0 in Accession #:    [id number]    Weight:       204.0 lb Date of Birth:  1955-11-12     BSA:          2.149 m Patient Age:    66 years      BP:           139/85 mmHg Patient Gender: M             HR:           84 bpm. Exam Location:  Inpatient  Procedure: 2D Echo  Indications:    Chest pain. syncope  History:        Patient has prior history of Echocardiogram examinations, most recent 09/24/2021. CAD, Abnormal ECG, Signs/Symptoms:Chest Pain and Syncope; Risk Factors:Hypertension, Dyslipidemia and Sleep Apnea.  Sonographer:    Delcie Roch RDCS Referring Phys: [id number] [facility]   Sonographer Comments: Suboptimal subcostal window. IMPRESSIONS   1. Left ventricular ejection fraction, by estimation, is 60 to 65%. The left ventricle has normal function. The left ventricle has no regional wall motion abnormalities. Left ventricular diastolic parameters are indeterminate. 2. Right ventricular systolic function is normal. The right ventricular size is normal. Tricuspid regurgitation signal is inadequate for assessing PA pressure. 3. The mitral valve is normal in structure. Trivial mitral valve regurgitation. No evidence of mitral stenosis. 4. The aortic valve is normal in structure. Aortic valve regurgitation is mild to moderate. No aortic stenosis is present. 5. There is dilatation of the ascending aorta, measuring 41 mm. 6. The inferior vena cava is normal in size with greater than 50% respiratory variability, suggesting right atrial pressure of 3 mmHg.  FINDINGS Left Ventricle: Left ventricular ejection fraction, by  estimation, is 60 to 65%. The left ventricle has normal function. The left ventricle has no regional wall motion abnormalities. The left ventricular internal cavity size was normal in size. There is no left ventricular hypertrophy. Left ventricular diastolic  parameters are indeterminate.  Right Ventricle: The right ventricular size is normal. No increase in right ventricular wall thickness. Right ventricular systolic function is normal. Tricuspid regurgitation signal is inadequate for assessing PA pressure.  Left Atrium: Left atrial size was normal in size.  Right Atrium: Right atrial size was normal in size.  Pericardium: There is no evidence of pericardial effusion. Presence of epicardial fat layer.  Mitral Valve: The mitral valve is normal in structure. Trivial mitral valve regurgitation. No evidence of mitral valve stenosis.  Tricuspid Valve: The tricuspid valve is normal in structure. Tricuspid valve regurgitation is trivial. No evidence of tricuspid stenosis.  Aortic Valve: The aortic valve is normal in structure. Aortic valve regurgitation is mild to moderate. Aortic regurgitation PHT measures 427 msec. No aortic stenosis is present.  Pulmonic Valve: The pulmonic valve was normal in structure. Pulmonic valve regurgitation is not visualized. No evidence of pulmonic stenosis.  Aorta: There is dilatation of the ascending aorta, measuring 41 mm.  Venous: The inferior vena cava is normal in size with greater than 50% respiratory variability, suggesting right atrial pressure of 3 mmHg.  IAS/Shunts: No atrial level shunt detected by color flow Doppler.   LEFT VENTRICLE PLAX 2D LVIDd:         4.50 cm   Diastology LVIDs:         2.50 cm   LV e' medial:    6.20 cm/s LV PW:         0.90 cm   LV E/e' medial:  12.0 LV IVS:        1.00 cm   LV e' lateral:   9.14 cm/s LVOT diam:     1.90 cm   LV E/e' lateral: 8.1 LV SV:         72 LV SV Index:   33 LVOT Area:     2.84 cm   RIGHT  VENTRICLE RV S prime:     16.50 cm/s TAPSE (M-mode): 2.7 cm  LEFT ATRIUM             Index        RIGHT ATRIUM           Index LA diam:        3.80 cm 1.77 cm/m   RA Area:     14.30 cm LA Vol (A2C):   41.0 ml 19.08 ml/m  RA Volume:   34.30 ml  15.96 ml/m LA Vol (A4C):   53.0 ml 24.67 ml/m LA Biplane Vol: 48.1 ml 22.39 ml/m AORTIC VALVE LVOT Vmax:   138.00 cm/s LVOT Vmean:  90.300 cm/s LVOT VTI:    0.253 m AI PHT:      427 msec  AORTA Ao Root diam: 3.60 cm Ao Asc diam:  4.20 cm  MITRAL VALVE MV Area (PHT): 3.77 cm     SHUNTS MV Decel Time: 201 msec     Systemic VTI:  0.25 m MV E velocity: 74.40 cm/s   Systemic Diam: 1.90 cm MV A velocity: 124.00 cm/s MV E/A ratio:  0.60  Kardie Tobb DO Electronically signed by Thomasene Ripple DO Signature Date/Time: 02/06/2022/12:33:35 PM    Final    MONITORS  LONG TERM MONITOR (3-14 DAYS) 03/16/2022  Narrative Patch Wear Time:  13 days and 22 hours (2023-11-08T14:00:06-498 to 2023-11-22T12:51:19-0500)  Patient had a min HR of 45 bpm, max HR of 162 bpm, and avg HR of 66 bpm. Predominant underlying rhythm was Sinus Rhythm.  There were 2 triggered events both  sinus rhythm without ectopy.  There were no pauses of 3 seconds or greater and no episodes of second or third-degree AV block.  8 paroxysmal atrial tachycardia runs occurred, the run with the fastest interval lasting 4 beats with a max rate of 162 bpm, the longest lasting 7 beats with an avg rate of 123 bpm. Isolated SVEs were rare (<1.0%), SVE Couplets were rare (<1.0%), and SVE Triplets were rare (<1.0%).  Isolated VEs were rare (<1.0%), VE Couplets were rare (<1.0%), and no VE Triplets were present.   CT SCANS  CT CORONARY MORPH W/CTA COR W/SCORE 08/29/2021  Addendum 08/29/2021  4:14 PM ADDENDUM REPORT: 08/29/2021 16:12  EXAM: OVER-READ INTERPRETATION  CT CHEST  The following report is a limited chest CT over-read performed by radiologist Dr. Jeronimo Greaves of  [facility] on 08/29/2021. This over-read does not include interpretation of cardiac or coronary anatomy or pathology the coronary CTA interpretation by the cardiologist is attached.  COMPARISON:  01/25/2021 chest radiograph. Most recent chest CT 02/21/2021 from [facility].  FINDINGS: Vascular: Ascending aortic dilatation at 4.4 cm on 19/2, similar to 4.6 cm on the prior exam. Aortic atherosclerosis. No central pulmonary embolism, on this non-dedicated study. Mild dilatation of a pulmonary artery branch within the left lower lobe on 53/12 has been present back to a CTA abdomen of 12/01/2017.  Mediastinum/Nodes: No imaged thoracic adenopathy. Small hiatal hernia.  Lungs/Pleura: No pleural fluid. Right middle lobe 3 mm pulmonary nodule on 37/13 is similar to on the prior exam and present back to 06/21/2020, considered benign.  a a 4 mm subpleural right lower lobe pulmonary nodule on 39/13 is also present on the remote CT and can be presumed a benign subpleural lymph node.  Left base scarring.  Developing 4 mm left lower lobe pulmonary nodule on 63/13 is most likely a subpleural lymph node but not readily apparent on the prior.  Upper Abdomen: Normal imaged portions of the liver, spleen, gallbladder.  Musculoskeletal: No acute osseous abnormality.  IMPRESSION: 1.  No acute findings in the imaged extracardiac chest. 2. Ascending aortic aneurysm, similar at 4.4 cm. Recommend annual imaging followup by CTA or MRA. This recommendation follows 2010 ACCF/AHA/AATS/ACR/ASA/SCA/SCAI/SIR/STS/SVM Guidelines for the Diagnosis and Management of Patients with Thoracic Aortic Disease. Circulation. 2010; 121: U272-Z366. Aortic aneurysm NOS (ICD10-I71.9) 3. Pulmonary nodules, including a developing 4 mm probable subpleural lymph node in the left lower lobe. No follow-up needed if patient is low-risk. Non-contrast chest CT can be considered in 12 months if patient is  high-risk. This recommendation follows the consensus statement: Guidelines for Management of Incidental Pulmonary Nodules Detected on CT Images: From the Fleischner Society 2017; Radiology 2017; 284:228-243. 4. Hiatal hernia.   Electronically Signed By: Jeronimo Greaves M.D. On: 08/29/2021 16:12  Narrative CLINICAL DATA:  67 Year-old White Male  EXAM: Cardiac/Coronary  CTA  TECHNIQUE: The patient was scanned on a [employer].  FINDINGS: Scan was triggered in the descending thoracic aorta. Axial non-contrast 3 mm slices were carried out through the heart. The data set was analyzed on a dedicated work station and scored using the Agatson method. Gantry rotation speed was 250 msecs and collimation was .6 mm. 0.8 mg of sl NTG was given. The 3D data set was reconstructed in 5% intervals of the 67-82 % of the R-R cycle. Diastolic phases were analyzed on a dedicated work station using MPR, MIP and VRT modes. The patient received 100 cc of contrast.  Aorta: Moderate ascending aortic aneurysm:  45 mm. No calcifications. No dissection.  Main Pulmonary Artery: Normal size of the pulmonary artery.  Aortic Valve:  Tri-leaflet.  Aortic Valve calcium score 35.  Mitral Valve: Mild mitral annular calcification.  Coronary Arteries:  Normal coronary origin.  Right dominance.  Coronary Calcium Score:  Left main: 0  Left anterior descending artery: 0  Left circumflex artery: 3  Right coronary artery: 26  Total: 29  Percentile: 33rd for age, sex, and race matched control.  RCA is a large dominant artery that gives rise to PDA and PLA. There are plaques in the R-PDA: Mild calcified lesion, distal to this there is a severe mixed plaque RCA lesion.  Left main is a large artery that gives rise to LAD and LCX arteries. There is no significant plaque.  LAD is a large vessel that gives rise to two diagonal branches. There is no significant plaque.  LCX is a non-dominant  artery that gives rise to two OM branch. Minimal calcified plaque OM1.  Other findings:  Normal variant pulmonary vein drainage into the left atrium, presence of a right middle pulmonary vein.  Normal left atrial appendage without a thrombus.  Extra-cardiac findings: See attached radiology report for non-cardiac structures.  IMPRESSION: 1. Coronary calcium score of 29. This was 33rd percentile for age, sex, and race matched control.  2. Normal coronary origin with right dominance.  3. Moderate ascending aortic aneurysm: 45 mm.  4. CAD-RADS 4 Severe stenosis. (70-99% or > 50% left main). Cardiac catheterization or CT FFR is recommended. Consider symptom-guided anti-ischemic pharmacotherapy as well as risk factor modification per guideline directed care.  RECOMMENDATIONS:  Coronary artery calcium (CAC) score is a strong predictor of incident coronary heart disease (CHD) and provides predictive information beyond traditional risk factors. CAC scoring is reasonable to use in the decision to withhold, postpone, or initiate statin therapy in intermediate-risk or selected borderline-risk asymptomatic adults (age 62-75 years and LDL-C >=70 to <190 mg/dL) who do not have diabetes or established atherosclerotic cardiovascular disease (ASCVD).* In intermediate-risk (10-year ASCVD risk >=7.5% to <20%) adults or selected borderline-risk (10-year ASCVD risk >=5% to <7.5%) adults in whom a CAC score is measured for the purpose of making a treatment decision the following recommendations have been made:  If CAC = 0, it is reasonable to withhold statin therapy and reassess in 5 to 10 years, as long as higher risk conditions are absent (diabetes mellitus, family history of premature CHD in first degree relatives (males <55 years; females <65 years), cigarette smoking, LDL >=190 mg/dL or other independent risk factors).  If CAC is 1 to 99, it is reasonable to initiate statin therapy  for patients >=74 years of age.  If CAC is >=100 or >=75th percentile, it is reasonable to initiate statin therapy at any age.  Cardiology referral should be considered for patients with CAC scores =400 or >=75th percentile.  *2018 AHA/ACC/AACVPR/AAPA/ABC/ACPM/ADA/AGS/APhA/ASPC/NLA/PCNA Guideline on the Management of Blood Cholesterol: A Report of the American College of Cardiology/American Heart Association Task Force on Clinical Practice Guidelines. J Am Coll Cardiol. 2019;73(24):3168-3209.  Riley Lam, MD  Electronically Signed: By: Riley Lam M.D. On: 08/29/2021 14:53            Recent Labs: 11/14/2021: NT-Pro BNP 67 02/06/2022: BUN 10; Creatinine, Ser 1.09; Hemoglobin 13.4; Platelets 210; Potassium 4.0; Sodium 138  Recent Lipid Panel    Component Value Date/Time   CHOL 141 04/17/2021 0944   TRIG 99 04/17/2021 0944   HDL 57 04/17/2021 0944  CHOLHDL 2.5 04/17/2021 0944   LDLCALC 66 04/17/2021 0944    Physical Exam:    VS:  BP 134/74   Pulse 80   Ht 6' (1.829 m)   Wt 204 lb 3.2 oz (92.6 kg)   SpO2 96%   BMI 27.69 kg/m     Wt Readings from Last 3 Encounters:  09/03/22 204 lb 3.2 oz (92.6 kg)  05/29/22 202 lb (91.6 kg)  05/02/22 203 lb 12.8 oz (92.4 kg)     GEN:  Well nourished, well developed in no acute distress HEENT: Normal NECK: No JVD; No carotid bruits LYMPHATICS: No lymphadenopathy CARDIAC: RRR, no murmurs, rubs, gallops RESPIRATORY:  Clear to auscultation without rales, wheezing or rhonchi  ABDOMEN: Soft, non-tender, non-distended MUSCULOSKELETAL:  No edema; No deformity  SKIN: Warm and dry NEUROLOGIC:  Alert and oriented x 3 PSYCHIATRIC:  Normal affect    Signed, Norman Herrlich, MD  09/03/2022 9:43 AM    [facility]

## 2022-09-03 ENCOUNTER — Encounter: Payer: Self-pay | Admitting: Cardiology

## 2022-09-03 ENCOUNTER — Ambulatory Visit: Payer: Medicare HMO | Attending: Cardiology | Admitting: Cardiology

## 2022-09-03 VITALS — BP 134/74 | HR 80 | Ht 72.0 in | Wt 204.2 lb

## 2022-09-03 DIAGNOSIS — R0602 Shortness of breath: Secondary | ICD-10-CM

## 2022-09-03 DIAGNOSIS — E782 Mixed hyperlipidemia: Secondary | ICD-10-CM

## 2022-09-03 DIAGNOSIS — R000 Tachycardia, unspecified: Secondary | ICD-10-CM | POA: Diagnosis not present

## 2022-09-03 DIAGNOSIS — I119 Hypertensive heart disease without heart failure: Secondary | ICD-10-CM

## 2022-09-03 DIAGNOSIS — I25118 Atherosclerotic heart disease of native coronary artery with other forms of angina pectoris: Secondary | ICD-10-CM

## 2022-09-03 DIAGNOSIS — I7789 Other specified disorders of arteries and arterioles: Secondary | ICD-10-CM | POA: Diagnosis not present

## 2022-09-03 NOTE — Addendum Note (Signed)
Addended by: Roxanne Mins I on: 09/03/2022 01:55 PM   Modules accepted: Orders

## 2022-09-03 NOTE — Patient Instructions (Addendum)
Medication Instructions:  Your physician recommends that you continue on your current medications as directed. Please refer to the Current Medication list given to you today.  *If you need a refill on your cardiac medications before your next appointment, please call your pharmacy*   Lab Work: Your physician recommends that you return for lab work in:   Labs today: CMP, Lipids, Pro BNP  If you have labs (blood work) drawn today and your tests are completely normal, you will receive your results only by: MyChart Message (if you have MyChart) OR A paper copy in the mail If you have any lab test that is abnormal or we need to change your treatment, we will call you to review the results.   Testing/Procedures: Cardiopulmonary Exercise Test   Follow-Up: At [facility], you and your health needs are our priority.  As part of our continuing mission to provide you with exceptional heart care, we have created designated Provider Care Teams.  These Care Teams include your primary Cardiologist (physician) and Advanced Practice Providers (APPs -  Physician Assistants and Nurse Practitioners) who all work together to provide you with the care you need, when you need it.  We recommend signing up for the patient portal called "MyChart".  Sign up information is provided on this After Visit Summary.  MyChart is used to connect with patients for Virtual Visits (Telemedicine).  Patients are able to view lab/test results, encounter notes, upcoming appointments, etc.  Non-urgent messages can be sent to your provider as well.   To learn more about what you can do with MyChart, go to [email or web address].    Your next appointment:   6 month(s)  Provider:   Norman Herrlich, MD    Other Instructions None

## 2022-09-05 ENCOUNTER — Ambulatory Visit: Payer: Medicare HMO | Admitting: Internal Medicine

## 2022-09-05 ENCOUNTER — Encounter: Payer: Self-pay | Admitting: Internal Medicine

## 2022-09-05 VITALS — BP 140/80 | HR 87 | Resp 16

## 2022-09-05 DIAGNOSIS — G4733 Obstructive sleep apnea (adult) (pediatric): Secondary | ICD-10-CM | POA: Diagnosis not present

## 2022-09-05 DIAGNOSIS — J3089 Other allergic rhinitis: Secondary | ICD-10-CM

## 2022-09-05 DIAGNOSIS — R0609 Other forms of dyspnea: Secondary | ICD-10-CM

## 2022-09-05 DIAGNOSIS — J328 Other chronic sinusitis: Secondary | ICD-10-CM

## 2022-09-05 DIAGNOSIS — K219 Gastro-esophageal reflux disease without esophagitis: Secondary | ICD-10-CM | POA: Diagnosis not present

## 2022-09-05 LAB — COMPREHENSIVE METABOLIC PANEL
ALT: 20 IU/L (ref 0–44)
AST: 32 IU/L (ref 0–40)
Albumin/Globulin Ratio: 1.6 (ref 1.2–2.2)
Albumin: 4.5 g/dL (ref 3.9–4.9)
Alkaline Phosphatase: 129 IU/L — ABNORMAL HIGH (ref 44–121)
BUN/Creatinine Ratio: 10 (ref 10–24)
BUN: 14 mg/dL (ref 8–27)
Bilirubin Total: 0.4 mg/dL (ref 0.0–1.2)
CO2: 24 mmol/L (ref 20–29)
Calcium: 9.2 mg/dL (ref 8.6–10.2)
Chloride: 98 mmol/L (ref 96–106)
Creatinine, Ser: 1.36 mg/dL — ABNORMAL HIGH (ref 0.76–1.27)
Globulin, Total: 2.8 g/dL (ref 1.5–4.5)
Glucose: 88 mg/dL (ref 70–99)
Potassium: 3.5 mmol/L (ref 3.5–5.2)
Sodium: 139 mmol/L (ref 134–144)
Total Protein: 7.3 g/dL (ref 6.0–8.5)
eGFR: 57 mL/min/{1.73_m2} — ABNORMAL LOW (ref >59)

## 2022-09-05 LAB — LIPID PANEL
Chol/HDL Ratio: 2.8 ratio (ref 0.0–5.0)
Cholesterol, Total: 117 mg/dL (ref 100–199)
HDL: 42 mg/dL (ref >39)
LDL Chol Calc (NIH): 48 mg/dL (ref 0–99)
Triglycerides: 161 mg/dL — ABNORMAL HIGH (ref 0–149)
VLDL Cholesterol Cal: 27 mg/dL (ref 5–40)

## 2022-09-05 LAB — PRO B NATRIURETIC PEPTIDE: NT-Pro BNP: 118 pg/mL (ref 0–376)

## 2022-09-05 NOTE — Progress Notes (Unsigned)
Follow Up Note  RE: Joe Arroyo MRN: [id number] DOB: 26-Mar-1956 Date of Office Visit: 09/05/2022  Referring provider: Blane Ohara, MD Primary care provider: Blane Ohara, MD  Chief Complaint: Shortness of Breath and Allergies  History of Present Illness: I had the pleasure of seeing Joe Arroyo for a follow up visit at the [facility] on 09/09/2022. He is a 67 y.o. male with complicated cardiopulmonary history who is being followed for CRS, allergic rhinitis. His previous allergy office visit was on 05/30/22 with Dr. Marlynn Arroyo. Today is a regular follow up visit.  History obtained from patient, chart review.  Last visit patient was restarted on allergy medications and reported improvement in cough, PND.    Today he reports postnasal drip and cough have improved since treatment for chronic rhinosinusitis restarting Flonase, Astelin, Atrovent nasal sprays and Singulair 10 mg daily.[name]  However he still has persistent dyspnea on exertion.  He saw his cardiologist this past week and they plan to do a CPEX study.  He has not noticed any worsening in his dyspnea since stopping his inhalers.  He is open to a referral to [facility] for pulmonary as his dyspnea is severely interfering with his quality of life and he has yet to find a diagnosis.  Please see previous note from 04/21/2022 for full summary of cardiac and pulmonary workup.  Assessment and Plan: [name] is a 66 y.o. male with: Other allergic rhinitis  Other chronic sinusitis  Gastroesophageal reflux disease without esophagitis  OSA (obstructive sleep apnea)  Dyspnea on exertion   Plan: Patient Instructions  Breathing tests previously showed mild restrictive pattern with no significant postbronchodilator response Allergy testing previously showed positive to grass pollen and weed pollen  CT Scan : mild CRS and deviated septum   Copy given today   Get CPEX study ordered by cardiology   We will  try to find an academic pulmonologist for you to see   Seasonal allergic Rhinitis with chronic sinusitis   - Continue allergy avoidance measures to pollen   - Continue Nasal Steroid Spray: Options include Flonase (fluticasone), Nasocort (triamcinolone), Nasonex (mometasome) 1- 2 sprays in each nostril daily (can buy over-the-counter if not covered by insurance)  Best results if used daily. - Continue Astelin (Azelastine) 1-2 sprays in each nostril twice a day as needed.  You may use this as needed for nasal congestion/itchy ears/itchy nose if desired - Continue Atrovent (Ipratropium Bromide) 1-2 sprays in each nostril up to 3 times a day as needed for runny nose/post nasal drip/drainage.  Use less frequently if airway gets too dry. - Continue Singulair (Montelukast) 10mg  nightly. - Continue over the counter antihistamine daily or daily as needed.   -Your options include Zyrtec (Cetirizine) 10mg , Claritin (Loratadine) 10mg , Allegra (Fexofenadine) 180mg , or Xyzal (Levocetirinze) 5mg   Follow up: 6 months  Thank you so much for letting me partake in your care today.  Don't hesitate to reach out if you have any additional concerns!  Joe Luz, MD  [facility] [place] [place]   No orders of the defined types were placed in this encounter.   Lab Orders  No laboratory test(s) ordered today   Diagnostics: None   Medication List:  Current Outpatient Medications  Medication Sig Dispense Refill   amLODipine (NORVASC) 2.5 MG tablet TAKE ONE TABLET BY MOUTH EVERY DAY FOR BLOOD PRESSURE 90 tablet 1   azelastine (ASTELIN) 0.1 % nasal spray 1-2 sprays in each nostril twice  a day as needed 30 mL 5   BREO ELLIPTA 100-25 MCG/ACT AEPB Inhale 1 puff into the lungs daily.     cloNIDine (CATAPRES) 0.3 MG tablet Take 1 tablet (0.3 mg total) by mouth daily. 90 tablet 1   clopidogrel (PLAVIX) 75 MG tablet TAKE ONE TABLET BY MOUTH DAILY 90 tablet 1   esomeprazole (NEXIUM) 10 MG packet  Take 10 mg by mouth daily before breakfast.     ezetimibe (ZETIA) 10 MG tablet Take 1 tablet (10 mg total) by mouth daily. 90 tablet 1   finasteride (PROSCAR) 5 MG tablet Take 5 mg by mouth daily.     fluticasone (FLONASE) 50 MCG/ACT nasal spray 1-2 sprays in each nostril daily 16 g 5   furosemide (LASIX) 40 MG tablet Take 1 tablet (40 mg total) by mouth 2 (two) times daily. 180 tablet 3   ipratropium (ATROVENT) 0.06 % nasal spray 1-2 sprays in each nostril up to 3 times a day as needed for runny nose/post nasal drip/drainage 15 mL 5   levocetirizine (XYZAL) 5 MG tablet Take 1 tablet (5 mg total) by mouth every evening. 30 tablet 5   magnesium oxide (MAG-OX) 400 (240 Mg) MG tablet Take 400 mg by mouth daily.     meloxicam (MOBIC) 15 MG tablet Take 1 tablet (15 mg total) by mouth daily as needed. 90 tablet 0   montelukast (SINGULAIR) 10 MG tablet Take 1 tablet (10 mg total) by mouth at bedtime. 30 tablet 5   Multiple Vitamins-Minerals (CENTRUM SILVER ULTRA MENS PO) Take 1 tablet by mouth daily.     Omega-3 Fatty Acids (FISH OIL PO) Take 1 tablet by mouth 2 (two) times daily.     rosuvastatin (CRESTOR) 40 MG tablet Take 1 tablet (40 mg total) by mouth daily. 90 tablet 0   zinc gluconate 50 MG tablet Take 50 mg by mouth daily.     No current facility-administered medications for this visit.   Allergies: Allergies  Allergen Reactions   Nitroglycerin Other (See Comments)    Severe hypotension, caused 100 point drop in systolic BP   Beta Adrenergic Blockers Other (See Comments)    bradycardia   Bystolic [Nebivolol Hcl] Other (See Comments)    bradycardia   Carvedilol Other (See Comments)    bradycardia   Pravastatin Other (See Comments)    "made my joints hurt"   Tamsulosin Other (See Comments)    Syncope   Hctz [Hydrochlorothiazide] Rash   Hydralazine Rash   Penicillins Rash   I reviewed his past medical history, social history, family history, and environmental history and no  significant changes have been reported from his previous visit.  ROS: All others negative except as noted per HPI.   Objective: BP (!) 140/80   Pulse 87   Resp 16   SpO2 97%  There is no height or weight on file to calculate BMI. General Appearance:  Alert, cooperative, no distress, appears stated age  Head:  Normocephalic, without obvious abnormality, atraumatic  Eyes:  Conjunctiva clear, EOM's intact  Nose: Nares normal, normal mucosa, no visible anterior polyps, and septum midline  Throat: Lips, tongue normal; teeth and gums normal, normal posterior oropharynx  Neck: Supple, symmetrical  Lungs:   clear to auscultation bilaterally, Respirations unlabored, no coughing  Heart:  regular rate and rhythm and no murmur, Appears well perfused  Extremities: No edema  Skin: Skin color, texture, turgor normal, no rashes or lesions on visualized portions of skin  Neurologic: No  gross deficits   Previous notes and tests were reviewed. The plan was reviewed with the patient/family, and all questions/concerned were addressed.  It was my pleasure to see Joe Arroyo today and participate in his care. Please feel free to contact me with any questions or concerns.  Sincerely,  Joe Luz, MD  Allergy & Immunology  [facility] Office: [phone]

## 2022-09-05 NOTE — Patient Instructions (Signed)
Breathing tests previously showed mild restrictive pattern with no significant postbronchodilator response Allergy testing previously showed positive to grass pollen and weed pollen  CT Scan : mild CRS and deviated septum   Copy given today   Get CPEX study ordered by cardiology   We will try to find an academic pulmonologist for you to see   Seasonal allergic Rhinitis with chronic sinusitis   - Continue allergy avoidance measures to pollen   - Continue Nasal Steroid Spray: Options include Flonase (fluticasone), Nasocort (triamcinolone), Nasonex (mometasome) 1- 2 sprays in each nostril daily (can buy over-the-counter if not covered by insurance)  Best results if used daily. - Continue Astelin (Azelastine) 1-2 sprays in each nostril twice a day as needed.  You may use this as needed for nasal congestion/itchy ears/itchy nose if desired - Continue Atrovent (Ipratropium Bromide) 1-2 sprays in each nostril up to 3 times a day as needed for runny nose/post nasal drip/drainage.  Use less frequently if airway gets too dry. - Continue Singulair (Montelukast) 10mg  nightly. - Continue over the counter antihistamine daily or daily as needed.   -Your options include Zyrtec (Cetirizine) 10mg , Claritin (Loratadine) 10mg , Allegra (Fexofenadine) 180mg , or Xyzal (Levocetirinze) 5mg   Follow up: 6 months  Thank you so much for letting me partake in your care today.  Don't hesitate to reach out if you have any additional concerns!  [name], MD  [facility] [place] [place]

## 2022-09-15 NOTE — Progress Notes (Signed)
Subjective:  Patient ID: Joe Arroyo, male    DOB: 1955-08-27  Age: 67 y.o. MRN: [id number]  Chief Complaint  Patient presents with   Medical Management of Chronic Issues    HPI  CORONARY ARTERY DISEASE: on plavix, aspirin, crestor, zetia, fish oil 1 tablet twice daily.   BPH: on proscar.   HYPERTENSION:  on clonidine 0.3 mg once daily and Norvasc 2.5 mg daily.    GERD: On protonix 40 mg daily.   Hyperlipidemia: Fish oil 1 tablet twice daily. Zetia 10 mg daily.   OSA: Mild. Patient has refused cpap.      09/16/2022    9:23 AM 09/12/2021    8:45 AM 08/01/2021    2:16 PM 06/13/2021   10:39 AM 01/03/2021    9:39 AM  Depression screen PHQ 2/9  Decreased Interest 0 0 0 0 0  Down, Depressed, Hopeless 0 0 0 0 0  PHQ - 2 Score 0 0 0 0 0  Altered sleeping 0      Tired, decreased energy 1      Change in appetite 0      Feeling bad or failure about yourself  0      Trouble concentrating 0      Moving slowly or fidgety/restless 0      Suicidal thoughts 0      PHQ-9 Score 1      Difficult doing work/chores Not difficult at all            09/16/2022    9:22 AM  Fall Risk   Falls in the past year? 0  Number falls in past yr: 0  Injury with Fall? 0  Risk for fall due to : No Fall Risks  Follow up Falls evaluation completed;Falls prevention discussed    Patient Care Team: Blane Ohara, MD as PCP - General (Internal Medicine) Baldo Daub, MD as PCP - Cardiology (Cardiology) Blane Ohara, MD as Referring Physician (Internal Medicine) Baldo Daub, MD as Consulting Physician (Cardiology) Martina Sinner, MD as Consulting Physician (Pulmonary Disease) Debroah Baller, MD as Consulting Physician (Urology)   Review of Systems  Constitutional:  Negative for chills and fever.  HENT:  Positive for congestion. Negative for rhinorrhea and sore throat.   Respiratory:  Positive for shortness of breath. Negative for cough.   Cardiovascular:  Positive for chest pain. Negative for  palpitations.  Gastrointestinal:  Negative for abdominal pain, constipation, diarrhea, nausea and vomiting.  Genitourinary:  Negative for dysuria and urgency.  Musculoskeletal:  Positive for arthralgias. Negative for back pain and myalgias.  Neurological:  Positive for headaches. Negative for dizziness.  Psychiatric/Behavioral:  Negative for dysphoric mood. The patient is not nervous/anxious.     Current Outpatient Medications on File Prior to Visit  Medication Sig Dispense Refill   amLODipine (NORVASC) 2.5 MG tablet TAKE ONE TABLET BY MOUTH EVERY DAY FOR BLOOD PRESSURE 90 tablet 1   azelastine (ASTELIN) 0.1 % nasal spray 1-2 sprays in each nostril twice a day as needed 30 mL 5   BREO ELLIPTA 100-25 MCG/ACT AEPB Inhale 1 puff into the lungs daily.     cloNIDine (CATAPRES) 0.3 MG tablet Take 1 tablet (0.3 mg total) by mouth daily. 90 tablet 1   clopidogrel (PLAVIX) 75 MG tablet TAKE ONE TABLET BY MOUTH DAILY 90 tablet 1   esomeprazole (NEXIUM) 10 MG packet Take 10 mg by mouth daily before breakfast.     ezetimibe (ZETIA) 10 MG tablet Take 1  tablet (10 mg total) by mouth daily. 90 tablet 1   finasteride (PROSCAR) 5 MG tablet Take 5 mg by mouth daily.     fluticasone (FLONASE) 50 MCG/ACT nasal spray 1-2 sprays in each nostril daily 16 g 5   furosemide (LASIX) 40 MG tablet Take 1 tablet (40 mg total) by mouth 2 (two) times daily. 180 tablet 3   ipratropium (ATROVENT) 0.06 % nasal spray 1-2 sprays in each nostril up to 3 times a day as needed for runny nose/post nasal drip/drainage 15 mL 5   levocetirizine (XYZAL) 5 MG tablet Take 1 tablet (5 mg total) by mouth every evening. 30 tablet 5   magnesium oxide (MAG-OX) 400 (240 Mg) MG tablet Take 400 mg by mouth daily.     montelukast (SINGULAIR) 10 MG tablet Take 1 tablet (10 mg total) by mouth at bedtime. 30 tablet 5   Multiple Vitamins-Minerals (CENTRUM SILVER ULTRA MENS PO) Take 1 tablet by mouth daily.     Omega-3 Fatty Acids (FISH OIL PO) Take 1  tablet by mouth 2 (two) times daily.     rosuvastatin (CRESTOR) 40 MG tablet Take 1 tablet (40 mg total) by mouth daily. 90 tablet 0   zinc gluconate 50 MG tablet Take 50 mg by mouth daily.     No current facility-administered medications on file prior to visit.   Past Medical History:  Diagnosis Date   Abnormal cardiac CT angiography    Atypical chest pain 01/07/2021   Chronic idiopathic constipation 01/07/2021   Coronary artery disease    Dyspnea on exertion 09/05/2019   Essential hypertension, benign 05/14/2018   GERD (gastroesophageal reflux disease) 09/28/2020   Hiatal hernia 09/28/2020   History of hiatal hernia    History of kidney stones    Hyperlipidemia 05/14/2018   Hypertensive heart disease 05/14/2018   09/17/2021: SUMMARY  Severe single-vessel disease as indicated by Coronary CTA with RPL 2 having tandem 90% and 80% stenoses (CT FFR positive)-this vessel is at least the same size as the LAD  Successful DES PCI of RPL 2 covering both lesions with a Synergy DES 2.25 mm x 20 mm postdilated in tapered fashion from 2.5 to 2.3 mm.)  Otherwise minimal CAD with relatively small caliber left coronary syst   Non-seasonal allergic rhinitis due to pollen 08/16/2019   OSA (obstructive sleep apnea) 09/28/2020   Other chronic sinusitis 05/02/2022   Other fatigue 03/17/2022   Other seasonal allergic rhinitis 05/02/2022   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   Shortness of breath 09/05/2019   Sinus bradycardia 05/14/2018   Syncope 09/05/2019   Thoracic ascending aortic aneurysm (HCC)    ascending TAA 4.6 x 4.4 cm 10/03/20 CTA chest   Past Surgical History:  Procedure Laterality Date   COLONOSCOPY     CORONARY STENT INTERVENTION N/A 10/07/2021   Procedure: CORONARY STENT INTERVENTION;  Surgeon: Marykay Lex, MD;  Location[facility] INVASIVE CV LAB;  Service: Cardiovascular;  Laterality: N/A;   ESOPHAGOGASTRODUODENOSCOPY N/A 12/05/2020   Procedure: ESOPHAGOGASTRODUODENOSCOPY (EGD);  Surgeon:  Corliss Skains, MD;  Location: [facility] OR;  Service: Thoracic;  Laterality: N/A;   LAMINECTOMY     MOLE REMOVAL     PROSTATE BIOPSY     RIGHT/LEFT HEART CATH AND CORONARY ANGIOGRAPHY N/A 10/07/2021   Procedure: RIGHT/LEFT HEART CATH AND CORONARY ANGIOGRAPHY;  Surgeon: Marykay Lex, MD;  Location: [facility] INVASIVE CV LAB;  Service: Cardiovascular;  Laterality: N/A;   TONSILLECTOMY AND ADENOIDECTOMY     UPPER GASTROINTESTINAL ENDOSCOPY  XI ROBOTIC ASSISTED PARAESOPHAGEAL HERNIA REPAIR N/A 12/05/2020   Procedure: XI ROBOTIC ASSISTED PARAESOPHAGEAL HERNIA REPAIR AND FUNDOPLICATION;  Surgeon: Corliss Skains, MD;  Location: [facility] OR;  Service: Thoracic;  Laterality: N/A;    Family History  Problem Relation Age of Onset   Hyperlipidemia Mother    Aortic stenosis Mother    Valvular heart disease Mother    Colon cancer Mother    Diabetes Mellitus II Mother    Cancer - Colon Mother    Cancer Mother    Stroke Mother    Cerebrovascular Accident Mother    Diabetes Mother    Hypertension Mother    Hyperlipidemia Father    Diabetes Mellitus II Father    Diabetes Father    Alzheimer's disease Father    Esophageal cancer Paternal Uncle    CAD Maternal Grandmother    Congestive Heart Failure Maternal Grandmother    Heart attack Maternal Grandmother    Heart failure Maternal Grandmother    Coronary artery disease Maternal Grandmother    Hyperlipidemia Maternal Grandmother    Social History   Socioeconomic History   Marital status: Single    Spouse name: Not on file   Number of children: Not on file   Years of education: Not on file   Highest education level: Not on file  Occupational History   Not on file  Tobacco Use   Smoking status: Never   Smokeless tobacco: Never  Vaping Use   Vaping Use: Never used  Substance and Sexual Activity   Alcohol use: Yes    Comment: occasional   Drug use: Never   Sexual activity: Not on file  Other Topics Concern   Not on file  Social  History Narrative   Not on file   Social Determinants of Health   Financial Resource Strain: Low Risk  (09/12/2021)   Overall Financial Resource Strain (CARDIA)    Difficulty of Paying Living Expenses: Not hard at all  Food Insecurity: No Food Insecurity (04/03/2022)   Hunger Vital Sign    Worried About Running Out of Food in the Last Year: Never true    Ran Out of Food in the Last Year: Never true  Transportation Needs: No Transportation Needs (04/03/2022)   PRAPARE - Administrator, Civil Service (Medical): No    Lack of Transportation (Non-Medical): No  Physical Activity: Unknown (09/12/2021)   Exercise Vital Sign    Days of Exercise per Week: 0 days    Minutes of Exercise per Session: Not on file  Stress: No Stress Concern Present (09/12/2021)   [employer] of Occupational Health - Occupational Stress Questionnaire    Feeling of Stress : Only a little  Social Connections: Socially Isolated (09/12/2021)   Social Connection and Isolation Panel [NHANES]    Frequency of Communication with Friends and Family: More than three times a week    Frequency of Social Gatherings with Friends and Family: Once a week    Attends Religious Services: Never    Database administrator or Organizations: No    Attends Engineer, structural: Never    Marital Status: Never married    Objective:  BP 130/72   Pulse 68   Temp (!) 97.3 F (36.3 C)   Resp 16   Ht 6' (1.829 m)   Wt 203 lb (92.1 kg)   BMI 27.53 kg/m      09/16/2022    9:26 AM 09/16/2022    9:17 AM 09/05/2022  10:37 AM  BP/Weight  Systolic BP 130 152 140  Diastolic BP 72 76 80  Wt. (Lbs)  203   BMI  27.53 kg/m2     Physical Exam Vitals reviewed.  Constitutional:      Appearance: Normal appearance.  Neck:     Vascular: No carotid bruit.  Cardiovascular:     Rate and Rhythm: Normal rate and regular rhythm.     Pulses: Normal pulses.     Heart sounds: Normal heart sounds.  Pulmonary:     Effort:  Pulmonary effort is normal.     Breath sounds: Normal breath sounds. No wheezing, rhonchi or rales.  Abdominal:     General: Bowel sounds are normal.     Palpations: Abdomen is soft.     Tenderness: There is no abdominal tenderness.  Neurological:     Mental Status: He is alert.  Psychiatric:        Mood and Affect: Mood normal.        Behavior: Behavior normal.     Diabetic Foot Exam - Simple   No data filed      Lab Results  Component Value Date   WBC 6.4 09/16/2022   HGB 13.2 09/16/2022   HCT 42.4 09/16/2022   PLT 232 09/16/2022   GLUCOSE 85 09/16/2022   CHOL 117 09/03/2022   TRIG 161 (H) 09/03/2022   HDL 42 09/03/2022   LDLCALC 48 09/03/2022   ALT 16 09/16/2022   AST 28 09/16/2022   NA 142 09/16/2022   K 4.2 09/16/2022   CL 102 09/16/2022   CREATININE 1.34 (H) 09/16/2022   BUN 12 09/16/2022   CO2 23 09/16/2022   TSH 3.150 09/25/2020   INR 1.0 12/03/2020      Assessment & Plan:    Hypertensive heart disease without heart failure Assessment & Plan: Management per specialist.  Follow up with Dr. Dulce Sellar.    Orders: -     CBC with Differential/Platelet -     Comprehensive metabolic panel  Gastroesophageal reflux disease without esophagitis Assessment & Plan: Continue protonix 40 mg daily.    Mixed hyperlipidemia Assessment & Plan: Well controlled.  No changes to medicines.  Continue to work on eating a healthy diet and exercise.  Labs drawn today.     BMI 27.0-27.9,adult  Dyspnea on exertion Assessment & Plan: Review possibility of stridor vs wheezing.  Review specialists notes.    Other orders -     Meloxicam; Take 1 tablet (15 mg total) by mouth daily as needed.  Dispense: 90 tablet; Refill: 0 -     [facility] CKD [facility]     Meds ordered this encounter  Medications   meloxicam (MOBIC) 15 MG tablet    Sig: Take 1 tablet (15 mg total) by mouth daily as needed.    Dispense:  90 tablet    Refill:  0    Orders Placed This  Encounter  Procedures   CBC with Differential/Platelet   Comprehensive metabolic panel   [facility] CKD [facility]     Follow-up: Return in about 3 months (around 12/17/2022) for chronic fasting.   I,Marla I Leal-Borjas,acting as a scribe for Blane Ohara, MD.,have documented all relevant documentation on the behalf of Blane Ohara, MD,as directed by  Blane Ohara, MD while in the presence of Blane Ohara, MD.   An After Visit Summary was printed and given to the patient.  Blane Ohara, MD [facility] [phone]

## 2022-09-15 NOTE — Assessment & Plan Note (Signed)
Continue protonix 40mg daily.  

## 2022-09-15 NOTE — Assessment & Plan Note (Signed)
Well controlled.  ?No changes to medicines.  ?Continue to work on eating a healthy diet and exercise.  ?Labs drawn today.  ?

## 2022-09-15 NOTE — Assessment & Plan Note (Signed)
Management per specialist.  Follow up with Dr. Dulce Sellar.

## 2022-09-16 ENCOUNTER — Ambulatory Visit (INDEPENDENT_AMBULATORY_CARE_PROVIDER_SITE_OTHER): Payer: Medicare HMO | Admitting: Family Medicine

## 2022-09-16 VITALS — BP 130/72 | HR 68 | Temp 97.3°F | Resp 16 | Ht 72.0 in | Wt 203.0 lb

## 2022-09-16 DIAGNOSIS — I119 Hypertensive heart disease without heart failure: Secondary | ICD-10-CM | POA: Diagnosis not present

## 2022-09-16 DIAGNOSIS — R0609 Other forms of dyspnea: Secondary | ICD-10-CM

## 2022-09-16 DIAGNOSIS — Z6827 Body mass index (BMI) 27.0-27.9, adult: Secondary | ICD-10-CM | POA: Diagnosis not present

## 2022-09-16 DIAGNOSIS — K219 Gastro-esophageal reflux disease without esophagitis: Secondary | ICD-10-CM

## 2022-09-16 DIAGNOSIS — E782 Mixed hyperlipidemia: Secondary | ICD-10-CM | POA: Diagnosis not present

## 2022-09-16 MED ORDER — MELOXICAM 15 MG PO TABS: 15.0000 mg | ORAL_TABLET | Freq: Every day | ORAL | 0 refills | Status: DC | PRN

## 2022-09-16 NOTE — Assessment & Plan Note (Signed)
Review possibility of stridor vs wheezing.  Review specialists notes.

## 2022-09-17 LAB — COMPREHENSIVE METABOLIC PANEL
ALT: 16 IU/L (ref 0–44)
AST: 28 IU/L (ref 0–40)
Albumin/Globulin Ratio: 2 (ref 1.2–2.2)
Albumin: 4.5 g/dL (ref 3.9–4.9)
Alkaline Phosphatase: 124 IU/L — ABNORMAL HIGH (ref 44–121)
BUN/Creatinine Ratio: 9 — ABNORMAL LOW (ref 10–24)
BUN: 12 mg/dL (ref 8–27)
Bilirubin Total: 0.4 mg/dL (ref 0.0–1.2)
CO2: 23 mmol/L (ref 20–29)
Calcium: 9.7 mg/dL (ref 8.6–10.2)
Chloride: 102 mmol/L (ref 96–106)
Creatinine, Ser: 1.34 mg/dL — ABNORMAL HIGH (ref 0.76–1.27)
Globulin, Total: 2.3 g/dL (ref 1.5–4.5)
Glucose: 85 mg/dL (ref 70–99)
Potassium: 4.2 mmol/L (ref 3.5–5.2)
Sodium: 142 mmol/L (ref 134–144)
Total Protein: 6.8 g/dL (ref 6.0–8.5)
eGFR: 58 mL/min/{1.73_m2} — ABNORMAL LOW (ref >59)

## 2022-09-17 LAB — CBC WITH DIFFERENTIAL/PLATELET
Basophils Absolute: 0 10*3/uL (ref 0.0–0.2)
Basos: 1 %
EOS (ABSOLUTE): 0.1 10*3/uL (ref 0.0–0.4)
Eos: 2 %
Hematocrit: 42.4 % (ref 37.5–51.0)
Hemoglobin: 13.2 g/dL (ref 13.0–17.7)
Immature Grans (Abs): 0 10*3/uL (ref 0.0–0.1)
Immature Granulocytes: 0 %
Lymphocytes Absolute: 1.4 10*3/uL (ref 0.7–3.1)
Lymphs: 21 %
MCH: 26.1 pg — ABNORMAL LOW (ref 26.6–33.0)
MCHC: 31.1 g/dL — ABNORMAL LOW (ref 31.5–35.7)
MCV: 84 fL (ref 79–97)
Monocytes Absolute: 0.8 10*3/uL (ref 0.1–0.9)
Monocytes: 13 %
Neutrophils Absolute: 4 10*3/uL (ref 1.4–7.0)
Neutrophils: 63 %
Platelets: 232 10*3/uL (ref 150–450)
RBC: 5.05 x10E6/uL (ref 4.14–5.80)
RDW: 16.1 % — ABNORMAL HIGH (ref 11.6–15.4)
WBC: 6.4 10*3/uL (ref 3.4–10.8)

## 2022-09-17 LAB — LITHOLINK CKD PROGRAM

## 2022-09-19 ENCOUNTER — Encounter: Payer: Self-pay | Admitting: Family Medicine

## 2022-09-23 ENCOUNTER — Telehealth: Payer: Self-pay

## 2022-09-23 NOTE — Telephone Encounter (Signed)
-----   Message from [name], MD sent at 09/09/2022 12:58 PM EDT ----- Can we facilitate a referral to Dr. Sheffield Slider and pulmonary at [facility] or Dr. Vira Browns and pulmonary at [facility] for progressive dyspnea on exertion.

## 2022-09-23 NOTE — Telephone Encounter (Signed)
Paltient is scheduled to see Dr Vira Browns on Friday July 26th @ 3:00 PM. Patient states he has to care for his dad around that time due to knee surgery. He is going to call their office to get reschedule. I am placing a letter in the mail with their address and contact information.  Referral has been faxed to the number below.  Fax#[phone]  [facility] [address] [place], [place] [address] [phone]

## 2022-10-13 ENCOUNTER — Other Ambulatory Visit: Payer: Self-pay | Admitting: Cardiology

## 2022-11-07 ENCOUNTER — Other Ambulatory Visit: Payer: Self-pay

## 2022-11-07 MED ORDER — AMLODIPINE BESYLATE 2.5 MG PO TABS: 2.5000 mg | ORAL_TABLET | Freq: Every day | ORAL | 1 refills | Status: DC

## 2022-11-11 ENCOUNTER — Ambulatory Visit (INDEPENDENT_AMBULATORY_CARE_PROVIDER_SITE_OTHER): Payer: Medicare HMO

## 2022-11-11 VITALS — Ht 73.83 in | Wt 200.0 lb

## 2022-11-11 DIAGNOSIS — Z0000 Encounter for general adult medical examination without abnormal findings: Secondary | ICD-10-CM

## 2022-11-11 NOTE — Progress Notes (Signed)
Subjective:   Joe Arroyo is a 67 y.o. male who presents for Medicare Annual/Subsequent preventive examination.  Visit Complete: Virtual  I connected with  [name] on 11/11/22 by a audio enabled telemedicine application and verified that I am speaking with the correct person using two identifiers.  Patient Location: Home  Provider Location: Home Office  I discussed the limitations of evaluation and management by telemedicine. The patient expressed understanding and agreed to proceed.  Patient Medicare AWV questionnaire was completed by the patient on ; I have confirmed that all information answered by patient is correct and no changes since this date.  Review of Systems    Vital Signs: Unable to obtain new vitals due to this being a telehealth visit.  Cardiac Risk Factors include: advanced age (>41men, >74 women);hypertension;male gender     Objective:    Today's Vitals   11/11/22 1438  Weight: 200 lb (90.7 kg)  Height: 6' 1.83" (1.875 m)   Body mass index is 25.8 kg/m.     11/11/2022    2:45 PM 02/06/2022    4:26 PM 02/06/2022    1:10 AM 10/07/2021    8:24 AM 09/12/2021    8:48 AM 06/13/2021   10:38 AM 12/03/2020    2:23 PM  Advanced Directives  Does Patient Have a Medical Advance Directive? Yes No No Yes Yes Yes Yes  Type of Estate agent of [place];Living will   Healthcare Power of [place];Living will Healthcare Power of [employer] of [place];Living will Healthcare Power of [place];Living will  Does patient want to make changes to medical advance directive? No - Patient declined   No - Patient declined  No - Patient declined No - Patient declined  Copy of Healthcare Power of Attorney in Chart? Yes - validated most recent copy scanned in chart (See row information)     Yes - validated most recent copy scanned in chart (See row information) No - copy requested  Would patient like information on creating a medical advance  directive?  No - Patient declined         Current Medications (verified) Outpatient Encounter Medications as of 11/11/2022  Medication Sig   amLODipine (NORVASC) 2.5 MG tablet Take 1 tablet (2.5 mg total) by mouth daily. for blood pressure   azelastine (ASTELIN) 0.1 % nasal spray 1-2 sprays in each nostril twice a day as needed   BREO ELLIPTA 100-25 MCG/ACT AEPB Inhale 1 puff into the lungs daily.   cloNIDine (CATAPRES) 0.3 MG tablet Take 1 tablet (0.3 mg total) by mouth daily.   clopidogrel (PLAVIX) 75 MG tablet TAKE ONE TABLET BY MOUTH DAILY   esomeprazole (NEXIUM) 10 MG packet Take 10 mg by mouth daily before breakfast.   ezetimibe (ZETIA) 10 MG tablet Take 1 tablet (10 mg total) by mouth daily.   finasteride (PROSCAR) 5 MG tablet Take 5 mg by mouth daily.   fluticasone (FLONASE) 50 MCG/ACT nasal spray 1-2 sprays in each nostril daily   furosemide (LASIX) 40 MG tablet Take 1 tablet (40 mg total) by mouth 2 (two) times daily.   ipratropium (ATROVENT) 0.06 % nasal spray 1-2 sprays in each nostril up to 3 times a day as needed for runny nose/post nasal drip/drainage   levocetirizine (XYZAL) 5 MG tablet Take 1 tablet (5 mg total) by mouth every evening.   magnesium oxide (MAG-OX) 400 (240 Mg) MG tablet Take 400 mg by mouth daily.   montelukast (SINGULAIR) 10 MG tablet Take 1  tablet (10 mg total) by mouth at bedtime.   Multiple Vitamins-Minerals (CENTRUM SILVER ULTRA MENS PO) Take 1 tablet by mouth daily.   Omega-3 Fatty Acids (FISH OIL PO) Take 1 tablet by mouth 2 (two) times daily.   rosuvastatin (CRESTOR) 40 MG tablet Take 1 tablet (40 mg total) by mouth daily.   zinc gluconate 50 MG tablet Take 50 mg by mouth daily.   No facility-administered encounter medications on file as of 11/11/2022.    Allergies (verified) Nitroglycerin, Beta adrenergic blockers, Bystolic [nebivolol hcl], Carvedilol, Pravastatin, Tamsulosin, Hctz [hydrochlorothiazide], Hydralazine, and Penicillins    History: Past Medical History:  Diagnosis Date   Abnormal cardiac CT angiography    Atypical chest pain 01/07/2021   Chronic idiopathic constipation 01/07/2021   Coronary artery disease    Dyspnea on exertion 09/05/2019   Essential hypertension, benign 05/14/2018   GERD (gastroesophageal reflux disease) 09/28/2020   Hiatal hernia 09/28/2020   History of hiatal hernia    History of kidney stones    Hyperlipidemia 05/14/2018   Hypertensive heart disease 05/14/2018   09/17/2021: SUMMARY  Severe single-vessel disease as indicated by Coronary CTA with RPL 2 having tandem 90% and 80% stenoses (CT FFR positive)-this vessel is at least the same size as the LAD  Successful DES PCI of RPL 2 covering both lesions with a Synergy DES 2.25 mm x 20 mm postdilated in tapered fashion from 2.5 to 2.3 mm.)  Otherwise minimal CAD with relatively small caliber left coronary syst   Non-seasonal allergic rhinitis due to pollen 08/16/2019   OSA (obstructive sleep apnea) 09/28/2020   Other chronic sinusitis 05/02/2022   Other fatigue 03/17/2022   Other seasonal allergic rhinitis 05/02/2022   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   Shortness of breath 09/05/2019   Sinus bradycardia 05/14/2018   Syncope 09/05/2019   Thoracic ascending aortic aneurysm (HCC)    ascending TAA 4.6 x 4.4 cm 10/03/20 CTA chest   Past Surgical History:  Procedure Laterality Date   COLONOSCOPY     CORONARY STENT INTERVENTION N/A 10/07/2021   Procedure: CORONARY STENT INTERVENTION;  Surgeon: Marykay Lex, MD;  Location: [facility] INVASIVE CV LAB;  Service: Cardiovascular;  Laterality: N/A;   ESOPHAGOGASTRODUODENOSCOPY N/A 12/05/2020   Procedure: ESOPHAGOGASTRODUODENOSCOPY (EGD);  Surgeon: Corliss Skains, MD;  Location: [facility] OR;  Service: Thoracic;  Laterality: N/A;   LAMINECTOMY     MOLE REMOVAL     PROSTATE BIOPSY     RIGHT/LEFT HEART CATH AND CORONARY ANGIOGRAPHY N/A 10/07/2021   Procedure: RIGHT/LEFT HEART CATH AND CORONARY  ANGIOGRAPHY;  Surgeon: Marykay Lex, MD;  Location: [facility] INVASIVE CV LAB;  Service: Cardiovascular;  Laterality: N/A;   TONSILLECTOMY AND ADENOIDECTOMY     UPPER GASTROINTESTINAL ENDOSCOPY     XI ROBOTIC ASSISTED PARAESOPHAGEAL HERNIA REPAIR N/A 12/05/2020   Procedure: XI ROBOTIC ASSISTED PARAESOPHAGEAL HERNIA REPAIR AND FUNDOPLICATION;  Surgeon: Corliss Skains, MD;  Location: [facility] OR;  Service: Thoracic;  Laterality: N/A;   Family History  Problem Relation Age of Onset   Hyperlipidemia Mother    Aortic stenosis Mother    Valvular heart disease Mother    Colon cancer Mother    Diabetes Mellitus II Mother    Cancer - Colon Mother    Cancer Mother    Stroke Mother    Cerebrovascular Accident Mother    Diabetes Mother    Hypertension Mother    Hyperlipidemia Father    Diabetes Mellitus II Father    Diabetes Father  Alzheimer's disease Father    Esophageal cancer Paternal Uncle    CAD Maternal Grandmother    Congestive Heart Failure Maternal Grandmother    Heart attack Maternal Grandmother    Heart failure Maternal Grandmother    Coronary artery disease Maternal Grandmother    Hyperlipidemia Maternal Grandmother    Social History   Socioeconomic History   Marital status: Single    Spouse name: Not on file   Number of children: Not on file   Years of education: Not on file   Highest education level: Not on file  Occupational History   Not on file  Tobacco Use   Smoking status: Never   Smokeless tobacco: Never  Vaping Use   Vaping status: Never Used  Substance and Sexual Activity   Alcohol use: Yes    Comment: occasional   Drug use: Never   Sexual activity: Not on file  Other Topics Concern   Not on file  Social History Narrative   Not on file   Social Determinants of Health   Financial Resource Strain: Low Risk  (11/11/2022)   Overall Financial Resource Strain (CARDIA)    Difficulty of Paying Living Expenses: Not hard at all  Food Insecurity: No Food  Insecurity (11/11/2022)   Hunger Vital Sign    Worried About Running Out of Food in the Last Year: Never true    Ran Out of Food in the Last Year: Never true  Transportation Needs: No Transportation Needs (11/11/2022)   PRAPARE - Administrator, Civil Service (Medical): No    Lack of Transportation (Non-Medical): No  Physical Activity: Sufficiently Active (11/11/2022)   Exercise Vital Sign    Days of Exercise per Week: 5 days    Minutes of Exercise per Session: 30 min  Stress: No Stress Concern Present (11/11/2022)   [employer] of Occupational Health - Occupational Stress Questionnaire    Feeling of Stress : Not at all  Social Connections: Socially Isolated (11/11/2022)   Social Connection and Isolation Panel [NHANES]    Frequency of Communication with Friends and Family: More than three times a week    Frequency of Social Gatherings with Friends and Family: More than three times a week    Attends Religious Services: Never    Database administrator or Organizations: No    Attends Engineer, structural: Never    Marital Status: Never married    Tobacco Counseling Counseling given: Not Answered   Clinical Intake:  Pre-visit preparation completed: No  Pain : No/denies pain     BMI - recorded: 25.8 Nutritional Status: BMI 25 -29 Overweight Nutritional Risks: None Diabetes: No  How often do you need to have someone help you when you read instructions, pamphlets, or other written materials from your doctor or pharmacy?: 1 - Never  Interpreter Needed?: No  Information entered by :: Theresa Mulligan LPN   Activities of Daily Living    11/11/2022    2:44 PM 02/06/2022    4:26 PM  In your present state of health, do you have any difficulty performing the following activities:  Hearing? 0 0  Vision? 0 0  Difficulty concentrating or making decisions? 0 0  Walking or climbing stairs? 0 0  Dressing or bathing? 0 0  Doing errands, shopping? 0 0   Preparing Food and eating ? N   Using the Toilet? N   In the past six months, have you accidently leaked urine? N   Do you  have problems with loss of bowel control? N   Managing your Medications? N   Managing your Finances? N   Housekeeping or managing your Housekeeping? N     Patient Care Team: Blane Ohara, MD as PCP - General (Internal Medicine) Dulce Sellar Iline Oven, MD as PCP - Cardiology (Cardiology) Blane Ohara, MD as Referring Physician (Internal Medicine) Baldo Daub, MD as Consulting Physician (Cardiology) Martina Sinner, MD as Consulting Physician (Pulmonary Disease) Debroah Baller, MD as Consulting Physician (Urology)  Indicate any recent Medical Services you may have received from other than [facility] providers in the past year (date may be approximate).     Assessment:   This is a routine wellness examination for Zam.  Hearing/Vision screen Hearing Screening - Comments:: Denies hearing difficulties   Vision Screening - Comments:: Wears rx glasses - up to date with routine eye exams with  Dr Su Hilt  Dietary issues and exercise activities discussed:     Goals Addressed               This Visit's Progress     Stay Healthy (pt-stated)         Depression Screen    11/11/2022    2:44 PM 09/16/2022    9:23 AM 09/12/2021    8:45 AM 08/01/2021    2:16 PM 06/13/2021   10:39 AM 01/03/2021    9:39 AM 06/19/2020    9:23 AM  PHQ 2/9 Scores  PHQ - 2 Score 0 0 0 0 0 0 0  PHQ- 9 Score 0 1     1    Fall Risk    11/11/2022    2:45 PM 09/16/2022    9:22 AM 03/17/2022    9:13 AM 09/12/2021    8:21 AM 08/01/2021    2:16 PM  Fall Risk   Falls in the past year? 0 0 0 0 0  Number falls in past yr: 0 0 0 0 0  Injury with Fall? 0 0 0 0 0  Risk for fall due to : No Fall Risks No Fall Risks No Fall Risks No Fall Risks   Follow up Falls prevention discussed Falls evaluation completed;Falls prevention discussed Falls evaluation completed Falls evaluation completed Falls  evaluation completed    MEDICARE RISK AT HOME:  Medicare Risk at Home - 11/11/22 1448     Any stairs in or around the home? No    If so, are there any without handrails? No    Home free of loose throw rugs in walkways, pet beds, electrical cords, etc? Yes    Adequate lighting in your home to reduce risk of falls? Yes    Life alert? No    Use of a cane, walker or w/c? No    Grab bars in the bathroom? No    Shower chair or bench in shower? No    Elevated toilet seat or a handicapped toilet? Yes             TIMED UP AND GO:  Was the test performed?  No    Cognitive Function:        11/11/2022    2:45 PM 09/12/2021    8:46 AM 06/13/2021   10:40 AM  6CIT Screen  What Year? 0 points 0 points 0 points  What month? 0 points 0 points 0 points  What time? 0 points 0 points 0 points  Count back from 20 0 points 0 points 0 points  Months in reverse 0  points 0 points 0 points  Repeat phrase 0 points 0 points 0 points  Total Score 0 points 0 points 0 points    Immunizations Immunization History  Administered Date(s) Administered   Fluad Quad(high Dose 65+) 12/27/2019, 01/03/2021, 03/17/2022   Influenza,inj,Quad PF,6+ Mos 12/22/2017   Moderna Covid-19 Vaccine Bivalent Booster 69yrs & up 04/01/2021   Moderna Sars-Covid-2 Vaccination 05/23/2019, 06/20/2019, 04/27/2020   PNEUMOCOCCAL CONJUGATE-20 09/12/2021   Pneumococcal Polysaccharide-23 09/03/2010   Tdap 12/22/2017   Zoster Recombinant(Shingrix) 06/29/2018, 09/25/2020    TDAP status: Up to date  Flu Vaccine status: Up to date  Pneumococcal vaccine status: Up to date  Covid-19 vaccine status: Completed vaccines  Qualifies for Shingles Vaccine? Yes   Zostavax completed Yes   Shingrix Completed?: Yes  Screening Tests Health Maintenance  Topic Date Due   Hepatitis C Screening  Never done   COVID-19 Vaccine (5 - 2023-24 season) 12/13/2021   Colonoscopy  02/10/2023   INFLUENZA VACCINE  11/13/2022   Medicare Annual  Wellness (AWV)  11/11/2023   DTaP/Tdap/Td (2 - Td or Tdap) 12/23/2027   Pneumonia Vaccine 80+ Years old  Completed   Zoster Vaccines- Shingrix  Completed   HPV VACCINES  Aged Out    Health Maintenance  Health Maintenance Due  Topic Date Due   Hepatitis C Screening  Never done   COVID-19 Vaccine (5 - 2023-24 season) 12/13/2021   Colonoscopy  02/10/2023    Colorectal cancer screening: Referral to GI placed Patient deferred. Pt aware the office will call re: appt.  Lung Cancer Screening: (Low Dose CT Chest recommended if Age 60-80 years, 20 pack-year currently smoking OR have quit w/in 15years.) does not qualify.     Additional Screening:  Hepatitis C Screening: does qualify;  Deferred  Vision Screening: Recommended annual ophthalmology exams for early detection of glaucoma and other disorders of the eye. Is the patient up to date with their annual eye exam?  Yes  Who is the provider or what is the name of the office in which the patient attends annual eye exams? Dr Su Hilt If pt is not established with a provider, would they like to be referred to a provider to establish care? No .   Dental Screening: Recommended annual dental exams for proper oral hygiene    Community Resource Referral / Chronic Care Management:  CRR required this visit?  No   CCM required this visit?  No     Plan:     I have personally reviewed and noted the following in the patient's chart:   Medical and social history Use of alcohol, tobacco or illicit drugs  Current medications and supplements including opioid prescriptions. Patient is not currently taking opioid prescriptions. Functional ability and status Nutritional status Physical activity Advanced directives List of other physicians Hospitalizations, surgeries, and ER visits in previous 12 months Vitals Screenings to include cognitive, depression, and falls Referrals and appointments  In addition, I have reviewed and discussed with  patient certain preventive protocols, quality metrics, and best practice recommendations. A written personalized care plan for preventive services as well as general preventive health recommendations were provided to patient.     Tillie Rung, LPN   07/21/8117   After Visit Summary: (MyChart) Due to this being a telephonic visit, the after visit summary with patients personalized plan was offered to patient via MyChart   Nurse Notes: Patient due Hep-C Screening

## 2022-11-11 NOTE — Patient Instructions (Addendum)
[name] , Thank you for taking time to come for your Medicare Wellness Visit. I appreciate your ongoing commitment to your health goals. Please review the following plan we discussed and let me know if I can assist you in the future.   Referrals/Orders/Follow-Ups/Clinician Recommendations:   This is a list of the screening recommended for you and due dates:  Health Maintenance  Topic Date Due   Hepatitis C Screening  Never done   COVID-19 Vaccine (5 - 2023-24 season) 12/13/2021   Colon Cancer Screening  02/10/2023   Flu Shot  11/13/2022   Medicare Annual Wellness Visit  11/11/2023   DTaP/Tdap/Td vaccine (2 - Td or Tdap) 12/23/2027   Pneumonia Vaccine  Completed   Zoster (Shingles) Vaccine  Completed   HPV Vaccine  Aged Out    Advanced directives: (In Chart) A copy of your advanced directives are scanned into your chart should your provider ever need it.  Next Medicare Annual Wellness Visit scheduled for next year: Yes  Preventive Care 85 Years and Older, Male  Preventive care refers to lifestyle choices and visits with your health care provider that can promote health and wellness. What does preventive care include? A yearly physical exam. This is also called an annual well check. Dental exams once or twice a year. Routine eye exams. Ask your health care provider how often you should have your eyes checked. Personal lifestyle choices, including: Daily care of your teeth and gums. Regular physical activity. Eating a healthy diet. Avoiding tobacco and drug use. Limiting alcohol use. Practicing safe sex. Taking low doses of aspirin every day. Taking vitamin and mineral supplements as recommended by your health care provider. What happens during an annual well check? The services and screenings done by your health care provider during your annual well check will depend on your age, overall health, lifestyle risk factors, and family history of disease. Counseling  Your health  care provider may ask you questions about your: Alcohol use. Tobacco use. Drug use. Emotional well-being. Home and relationship well-being. Sexual activity. Eating habits. History of falls. Memory and ability to understand (cognition). Work and work Astronomer. Screening  You may have the following tests or measurements: Height, weight, and BMI. Blood pressure. Lipid and cholesterol levels. These may be checked every 5 years, or more frequently if you are over 45 years old. Skin check. Lung cancer screening. You may have this screening every year starting at age 26 if you have a 30-pack-year history of smoking and currently smoke or have quit within the past 15 years. Fecal occult blood test (FOBT) of the stool. You may have this test every year starting at age 61. Flexible sigmoidoscopy or colonoscopy. You may have a sigmoidoscopy every 5 years or a colonoscopy every 10 years starting at age 44. Prostate cancer screening. Recommendations will vary depending on your family history and other risks. Hepatitis C blood test. Hepatitis B blood test. Sexually transmitted disease (STD) testing. Diabetes screening. This is done by checking your blood sugar (glucose) after you have not eaten for a while (fasting). You may have this done every 1-3 years. Abdominal aortic aneurysm (AAA) screening. You may need this if you are a current or former smoker. Osteoporosis. You may be screened starting at age 74 if you are at high risk. Talk with your health care provider about your test results, treatment options, and if necessary, the need for more tests. Vaccines  Your health care provider may recommend certain vaccines, such as: Influenza  vaccine. This is recommended every year. Tetanus, diphtheria, and acellular pertussis (Tdap, Td) vaccine. You may need a Td booster every 10 years. Zoster vaccine. You may need this after age 29. Pneumococcal 13-valent conjugate (PCV13) vaccine. One dose is  recommended after age 105. Pneumococcal polysaccharide (PPSV23) vaccine. One dose is recommended after age 93. Talk to your health care provider about which screenings and vaccines you need and how often you need them. This information is not intended to replace advice given to you by your health care provider. Make sure you discuss any questions you have with your health care provider. Document Released: 04/27/2015 Document Revised: 12/19/2015 Document Reviewed: 01/30/2015 Elsevier Interactive Patient Education  2017 [employer].  Fall Prevention in the Home Falls can cause injuries. They can happen to people of all ages. There are many things you can do to make your home safe and to help prevent falls. What can I do on the outside of my home? Regularly fix the edges of walkways and driveways and fix any cracks. Remove anything that might make you trip as you walk through a door, such as a raised step or threshold. Trim any bushes or trees on the path to your home. Use bright outdoor lighting. Clear any walking paths of anything that might make someone trip, such as rocks or tools. Regularly check to see if handrails are loose or broken. Make sure that both sides of any steps have handrails. Any raised decks and porches should have guardrails on the edges. Have any leaves, snow, or ice cleared regularly. Use sand or salt on walking paths during winter. Clean up any spills in your garage right away. This includes oil or grease spills. What can I do in the bathroom? Use night lights. Install grab bars by the toilet and in the tub and shower. Do not use towel bars as grab bars. Use non-skid mats or decals in the tub or shower. If you need to sit down in the shower, use a plastic, non-slip stool. Keep the floor dry. Clean up any water that spills on the floor as soon as it happens. Remove soap buildup in the tub or shower regularly. Attach bath mats securely with double-sided non-slip rug  tape. Do not have throw rugs and other things on the floor that can make you trip. What can I do in the bedroom? Use night lights. Make sure that you have a light by your bed that is easy to reach. Do not use any sheets or blankets that are too big for your bed. They should not hang down onto the floor. Have a firm chair that has side arms. You can use this for support while you get dressed. Do not have throw rugs and other things on the floor that can make you trip. What can I do in the kitchen? Clean up any spills right away. Avoid walking on wet floors. Keep items that you use a lot in easy-to-reach places. If you need to reach something above you, use a strong step stool that has a grab bar. Keep electrical cords out of the way. Do not use floor polish or wax that makes floors slippery. If you must use wax, use non-skid floor wax. Do not have throw rugs and other things on the floor that can make you trip. What can I do with my stairs? Do not leave any items on the stairs. Make sure that there are handrails on both sides of the stairs and use them. Fix  handrails that are broken or loose. Make sure that handrails are as long as the stairways. Check any carpeting to make sure that it is firmly attached to the stairs. Fix any carpet that is loose or worn. Avoid having throw rugs at the top or bottom of the stairs. If you do have throw rugs, attach them to the floor with carpet tape. Make sure that you have a light switch at the top of the stairs and the bottom of the stairs. If you do not have them, ask someone to add them for you. What else can I do to help prevent falls? Wear shoes that: Do not have high heels. Have rubber bottoms. Are comfortable and fit you well. Are closed at the toe. Do not wear sandals. If you use a stepladder: Make sure that it is fully opened. Do not climb a closed stepladder. Make sure that both sides of the stepladder are locked into place. Ask someone to  hold it for you, if possible. Clearly mark and make sure that you can see: Any grab bars or handrails. First and last steps. Where the edge of each step is. Use tools that help you move around (mobility aids) if they are needed. These include: Canes. Walkers. Scooters. Crutches. Turn on the lights when you go into a dark area. Replace any light bulbs as soon as they burn out. Set up your furniture so you have a clear path. Avoid moving your furniture around. If any of your floors are uneven, fix them. If there are any pets around you, be aware of where they are. Review your medicines with your doctor. Some medicines can make you feel dizzy. This can increase your chance of falling. Ask your doctor what other things that you can do to help prevent falls. This information is not intended to replace advice given to you by your health care provider. Make sure you discuss any questions you have with your health care provider. Document Released: 01/25/2009 Document Revised: 09/06/2015 Document Reviewed: 05/05/2014 Elsevier Interactive Patient Education  2017 [employer].

## 2022-11-17 ENCOUNTER — Telehealth: Payer: Self-pay

## 2022-11-17 ENCOUNTER — Other Ambulatory Visit: Payer: Self-pay

## 2022-11-17 DIAGNOSIS — E782 Mixed hyperlipidemia: Secondary | ICD-10-CM

## 2022-11-17 MED ORDER — ROSUVASTATIN CALCIUM 40 MG PO TABS: 40.0000 mg | ORAL_TABLET | Freq: Every day | ORAL | 0 refills | Status: DC

## 2022-11-17 MED ORDER — AMLODIPINE BESYLATE 2.5 MG PO TABS: 2.5000 mg | ORAL_TABLET | Freq: Every day | ORAL | 1 refills | Status: DC

## 2022-11-17 NOTE — Telephone Encounter (Signed)
Prescription Request  11/17/2022   What is the name of the medication or equipment? amLODipine (NORVASC) 2.5 MG tablet rosuvastatin (CRESTOR) 40 MG tablet  Have you contacted your pharmacy to request a refill? No   Which pharmacy would you like this sent to?  [facility] [place] - [place] - [address] [place] [address] Phone: [phone] Fax: [phone]     Patient notified that their request is being sent to the clinical staff for review and that they should receive a response within 2 business days.   Please advise at [facility] [phone]

## 2022-12-09 DIAGNOSIS — R339 Retention of urine, unspecified: Secondary | ICD-10-CM | POA: Diagnosis not present

## 2022-12-09 DIAGNOSIS — Z87442 Personal history of urinary calculi: Secondary | ICD-10-CM | POA: Diagnosis not present

## 2022-12-09 DIAGNOSIS — C61 Malignant neoplasm of prostate: Secondary | ICD-10-CM | POA: Diagnosis not present

## 2022-12-22 ENCOUNTER — Other Ambulatory Visit: Payer: Self-pay | Admitting: Cardiology

## 2022-12-23 ENCOUNTER — Encounter: Payer: Self-pay | Admitting: Family Medicine

## 2022-12-23 ENCOUNTER — Ambulatory Visit (INDEPENDENT_AMBULATORY_CARE_PROVIDER_SITE_OTHER): Payer: Medicare HMO | Admitting: Family Medicine

## 2022-12-23 VITALS — BP 134/80 | HR 90 | Temp 97.2°F | Ht 72.0 in | Wt 201.0 lb

## 2022-12-23 DIAGNOSIS — M791 Myalgia, unspecified site: Secondary | ICD-10-CM

## 2022-12-23 DIAGNOSIS — Z23 Encounter for immunization: Secondary | ICD-10-CM

## 2022-12-23 DIAGNOSIS — N1831 Chronic kidney disease, stage 3a: Secondary | ICD-10-CM | POA: Diagnosis not present

## 2022-12-23 DIAGNOSIS — R0609 Other forms of dyspnea: Secondary | ICD-10-CM

## 2022-12-23 DIAGNOSIS — I119 Hypertensive heart disease without heart failure: Secondary | ICD-10-CM | POA: Diagnosis not present

## 2022-12-23 DIAGNOSIS — E782 Mixed hyperlipidemia: Secondary | ICD-10-CM | POA: Diagnosis not present

## 2022-12-23 DIAGNOSIS — R0789 Other chest pain: Secondary | ICD-10-CM | POA: Diagnosis not present

## 2022-12-23 DIAGNOSIS — K219 Gastro-esophageal reflux disease without esophagitis: Secondary | ICD-10-CM | POA: Diagnosis not present

## 2022-12-23 NOTE — Patient Instructions (Addendum)
Increase amlodipine to 5 mg daily.  Bring bp log in 2 weeks.

## 2022-12-23 NOTE — Progress Notes (Unsigned)
Subjective:  Patient ID: Joe Arroyo, male    DOB: 1955-05-11  Age: 67 y.o. MRN: [id number]  Chief Complaint  Patient presents with   Medical Management of Chronic Issues    HPI CORONARY ARTERY DISEASE: on plavix, aspirin, crestor, zetia, fish oil 1 tablet twice daily.    BPH: on proscar.    HYPERTENSION:  on clonidine 0.3 mg once daily and Norvasc 2.5 mg daily.     GERD: On protonix 40 mg daily.    Hyperlipidemia: Fish oil 1 tablet twice daily. Zetia 10 mg daily.    OSA: Mild. Patient has refused cpap.   Patient states his shob is worsening. SpO2 is 99% States this has been ongoing for 2 years will have BL arm pain and chest pain due to the shob. Has seen 2 pulmonologist. Has appt with one is [place] 01/2023. SHORTNESS OF BREATH through out the day. Thorough pulmonary work up. Has been on numerous inhalers, none of which have helped. LAD stent did not resolve symptoms. ECHo and holtor monitors do not show a source of dypsnea.   Body aches worsening and now is constant. Much worsened in 2 months. Worsens towards the end of the day.      12/23/2022   11:37 AM 11/11/2022    2:44 PM 09/16/2022    9:23 AM 09/12/2021    8:45 AM 08/01/2021    2:16 PM  Depression screen PHQ 2/9  Decreased Interest 1 0 0 0 0  Down, Depressed, Hopeless 0 0 0 0 0  PHQ - 2 Score 1 0 0 0 0  Altered sleeping 0 0 0    Tired, decreased energy 1 0 1    Change in appetite 0 0 0    Feeling bad or failure about yourself  0 0 0    Trouble concentrating 0 0 0    Moving slowly or fidgety/restless 0 0 0    Suicidal thoughts 0 0 0    PHQ-9 Score 2 0 1    Difficult doing work/chores Somewhat difficult Not difficult at all Not difficult at all          11/11/2022    2:45 PM  Fall Risk   Falls in the past year? 0  Number falls in past yr: 0  Injury with Fall? 0  Risk for fall due to : No Fall Risks  Follow up Falls prevention discussed    Patient Care Team: Blane Ohara, MD as PCP - General (Internal  Medicine) Baldo Daub, MD as PCP - Cardiology (Cardiology) Blane Ohara, MD as Referring Physician (Internal Medicine) Baldo Daub, MD as Consulting Physician (Cardiology) Martina Sinner, MD as Consulting Physician (Pulmonary Disease) Debroah Baller, MD as Consulting Physician (Urology)   Review of Systems  Constitutional:  Negative for chills, diaphoresis, fatigue and fever.  HENT:  Negative for congestion, ear pain and sore throat.   Respiratory:  Positive for shortness of breath. Negative for cough.   Cardiovascular:  Positive for chest pain. Negative for leg swelling.  Gastrointestinal:  Negative for abdominal pain, constipation, diarrhea, nausea and vomiting.  Genitourinary:  Negative for dysuria and urgency.  Musculoskeletal:  Negative for arthralgias and myalgias.  Neurological:  Negative for dizziness and headaches.  Psychiatric/Behavioral:  Negative for dysphoric mood.     Current Outpatient Medications on File Prior to Visit  Medication Sig Dispense Refill   amLODipine (NORVASC) 2.5 MG tablet Take 1 tablet (2.5 mg total) by mouth daily. for blood pressure  90 tablet 1   azelastine (ASTELIN) 0.1 % nasal spray 1-2 sprays in each nostril twice a day as needed 30 mL 5   BREO ELLIPTA 100-25 MCG/ACT AEPB Inhale 1 puff into the lungs daily.     cloNIDine (CATAPRES) 0.3 MG tablet Take 1 tablet (0.3 mg total) by mouth daily. 90 tablet 1   clopidogrel (PLAVIX) 75 MG tablet TAKE ONE TABLET BY MOUTH DAILY 90 tablet 1   esomeprazole (NEXIUM) 10 MG packet Take 10 mg by mouth daily before breakfast.     ezetimibe (ZETIA) 10 MG tablet Take 1 tablet (10 mg total) by mouth daily. 90 tablet 1   finasteride (PROSCAR) 5 MG tablet Take 5 mg by mouth daily.     fluticasone (FLONASE) 50 MCG/ACT nasal spray 1-2 sprays in each nostril daily 16 g 5   furosemide (LASIX) 40 MG tablet Take 1 tablet (40 mg total) by mouth 2 (two) times daily. 180 tablet 1   ipratropium (ATROVENT) 0.06 % nasal  spray 1-2 sprays in each nostril up to 3 times a day as needed for runny nose/post nasal drip/drainage 15 mL 5   levocetirizine (XYZAL) 5 MG tablet Take 1 tablet (5 mg total) by mouth every evening. 30 tablet 5   magnesium oxide (MAG-OX) 400 (240 Mg) MG tablet Take 400 mg by mouth daily.     montelukast (SINGULAIR) 10 MG tablet Take 1 tablet (10 mg total) by mouth at bedtime. 30 tablet 5   Multiple Vitamins-Minerals (CENTRUM SILVER ULTRA MENS PO) Take 1 tablet by mouth daily.     Omega-3 Fatty Acids (FISH OIL PO) Take 1 tablet by mouth 2 (two) times daily.     rosuvastatin (CRESTOR) 40 MG tablet Take 1 tablet (40 mg total) by mouth daily. 90 tablet 0   zinc gluconate 50 MG tablet Take 50 mg by mouth daily.     No current facility-administered medications on file prior to visit.   Past Medical History:  Diagnosis Date   Abnormal cardiac CT angiography    Atypical chest pain 01/07/2021   Chronic idiopathic constipation 01/07/2021   Coronary artery disease    Dyspnea on exertion 09/05/2019   Essential hypertension, benign 05/14/2018   GERD (gastroesophageal reflux disease) 09/28/2020   Hiatal hernia 09/28/2020   History of hiatal hernia    History of kidney stones    Hyperlipidemia 05/14/2018   Hypertensive heart disease 05/14/2018   09/17/2021: SUMMARY  Severe single-vessel disease as indicated by Coronary CTA with RPL 2 having tandem 90% and 80% stenoses (CT FFR positive)-this vessel is at least the same size as the LAD  Successful DES PCI of RPL 2 covering both lesions with a Synergy DES 2.25 mm x 20 mm postdilated in tapered fashion from 2.5 to 2.3 mm.)  Otherwise minimal CAD with relatively small caliber left coronary syst   Non-seasonal allergic rhinitis due to pollen 08/16/2019   OSA (obstructive sleep apnea) 09/28/2020   Other chronic sinusitis 05/02/2022   Other fatigue 03/17/2022   Other seasonal allergic rhinitis 05/02/2022   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   Shortness of  breath 09/05/2019   Sinus bradycardia 05/14/2018   Syncope 09/05/2019   Thoracic ascending aortic aneurysm (HCC)    ascending TAA 4.6 x 4.4 cm 10/03/20 CTA chest   Past Surgical History:  Procedure Laterality Date   COLONOSCOPY     CORONARY STENT INTERVENTION N/A 10/07/2021   Procedure: CORONARY STENT INTERVENTION;  Surgeon: Marykay Lex, MD;  Location: [facility] INVASIVE  CV LAB;  Service: Cardiovascular;  Laterality: N/A;   ESOPHAGOGASTRODUODENOSCOPY N/A 12/05/2020   Procedure: ESOPHAGOGASTRODUODENOSCOPY (EGD);  Surgeon: Corliss Skains, MD;  Location: [facility] OR;  Service: Thoracic;  Laterality: N/A;   LAMINECTOMY     MOLE REMOVAL     PROSTATE BIOPSY     RIGHT/LEFT HEART CATH AND CORONARY ANGIOGRAPHY N/A 10/07/2021   Procedure: RIGHT/LEFT HEART CATH AND CORONARY ANGIOGRAPHY;  Surgeon: Marykay Lex, MD;  Location: [facility] INVASIVE CV LAB;  Service: Cardiovascular;  Laterality: N/A;   TONSILLECTOMY AND ADENOIDECTOMY     UPPER GASTROINTESTINAL ENDOSCOPY     XI ROBOTIC ASSISTED PARAESOPHAGEAL HERNIA REPAIR N/A 12/05/2020   Procedure: XI ROBOTIC ASSISTED PARAESOPHAGEAL HERNIA REPAIR AND FUNDOPLICATION;  Surgeon: Corliss Skains, MD;  Location: [facility] OR;  Service: Thoracic;  Laterality: N/A;    Family History  Problem Relation Age of Onset   Hyperlipidemia Mother    Aortic stenosis Mother    Valvular heart disease Mother    Colon cancer Mother    Diabetes Mellitus II Mother    Cancer - Colon Mother    Cancer Mother    Stroke Mother    Cerebrovascular Accident Mother    Diabetes Mother    Hypertension Mother    Hyperlipidemia Father    Diabetes Mellitus II Father    Diabetes Father    Alzheimer's disease Father    Esophageal cancer Paternal Uncle    CAD Maternal Grandmother    Congestive Heart Failure Maternal Grandmother    Heart attack Maternal Grandmother    Heart failure Maternal Grandmother    Coronary artery disease Maternal Grandmother    Hyperlipidemia Maternal Grandmother     Social History   Socioeconomic History   Marital status: Single    Spouse name: Not on file   Number of children: Not on file   Years of education: Not on file   Highest education level: Not on file  Occupational History   Not on file  Tobacco Use   Smoking status: Never   Smokeless tobacco: Never  Vaping Use   Vaping status: Never Used  Substance and Sexual Activity   Alcohol use: Yes    Comment: occasional   Drug use: Never   Sexual activity: Not on file  Other Topics Concern   Not on file  Social History Narrative   Not on file   Social Determinants of Health   Financial Resource Strain: Low Risk  (11/11/2022)   Overall Financial Resource Strain (CARDIA)    Difficulty of Paying Living Expenses: Not hard at all  Food Insecurity: No Food Insecurity (11/11/2022)   Hunger Vital Sign    Worried About Running Out of Food in the Last Year: Never true    Ran Out of Food in the Last Year: Never true  Transportation Needs: No Transportation Needs (11/11/2022)   PRAPARE - Administrator, Civil Service (Medical): No    Lack of Transportation (Non-Medical): No  Physical Activity: Sufficiently Active (11/11/2022)   Exercise Vital Sign    Days of Exercise per Week: 5 days    Minutes of Exercise per Session: 30 min  Stress: No Stress Concern Present (11/11/2022)   [employer] of Occupational Health - Occupational Stress Questionnaire    Feeling of Stress : Not at all  Social Connections: Socially Isolated (11/11/2022)   Social Connection and Isolation Panel [NHANES]    Frequency of Communication with Friends and Family: More than three times a week  Frequency of Social Gatherings with Friends and Family: More than three times a week    Attends Religious Services: Never    Diplomatic Services operational officer: No    Attends Engineer, structural: Never    Marital Status: Never married    Objective:  BP 134/80   Pulse 90   Temp (!) 97.2 F (36.2  C)   Ht 6' (1.829 m)   Wt 201 lb (91.2 kg)   SpO2 99%   BMI 27.26 kg/m      12/23/2022   11:35 AM 11/11/2022    2:38 PM 09/16/2022    9:26 AM  BP/Weight  Systolic BP 134 -- 130  Diastolic BP 80 -- 72  Wt. (Lbs) 201 200   BMI 27.26 kg/m2 25.8 kg/m2     Physical Exam Vitals reviewed.  Constitutional:      Appearance: Normal appearance. He is normal weight.  HENT:     Mouth/Throat:     Comments: No stridor when stethoscope placed over trachea although sounded stridorous when I initially came in to exam room. Cardiovascular:     Rate and Rhythm: Normal rate and regular rhythm.     Heart sounds: No murmur heard. Pulmonary:     Effort: Pulmonary effort is normal.     Breath sounds: Normal breath sounds.  Abdominal:     General: Abdomen is flat. Bowel sounds are normal.     Palpations: Abdomen is soft.     Tenderness: There is no abdominal tenderness.  Musculoskeletal:        General: No swelling, tenderness (no fibromyalgia tender points.) or deformity. Normal range of motion.  Neurological:     Mental Status: He is alert and oriented to person, place, and time.  Psychiatric:        Mood and Affect: Mood normal.        Behavior: Behavior normal.     Diabetic Foot Exam - Simple   No data filed      Lab Results  Component Value Date   WBC 6.4 09/16/2022   HGB 13.2 09/16/2022   HCT 42.4 09/16/2022   PLT 232 09/16/2022   GLUCOSE 85 09/16/2022   CHOL 117 09/03/2022   TRIG 161 (H) 09/03/2022   HDL 42 09/03/2022   LDLCALC 48 09/03/2022   ALT 16 09/16/2022   AST 28 09/16/2022   NA 142 09/16/2022   K 4.2 09/16/2022   CL 102 09/16/2022   CREATININE 1.34 (H) 09/16/2022   BUN 12 09/16/2022   CO2 23 09/16/2022   TSH 3.150 09/25/2020   INR 1.0 12/03/2020      Assessment & Plan:    Dyspnea on exertion Assessment & Plan: Check labs. Plan to decrease and discontinue clonidine due to risk of pulmonary fibrosis.  Rule out wegener's and sarcoidosis.   Orders: -      Sedimentation rate -     C-reactive protein -     ANCA Profile (RDL) -     Angiotensin converting enzyme  Other chest pain Assessment & Plan: Noncardiac. Related to dypsnea.    Hypertensive heart disease without heart failure Assessment & Plan: Increase amlodipine to 5 mg daily.  Bring bp log in 2 weeks.  Orders: -     CBC with Differential/Platelet -     Comprehensive metabolic panel  Mixed hyperlipidemia Assessment & Plan: Well controlled.  No changes to medicines. Fish oil 1 tablet twice daily. Zetia 10 mg daily.  Continue to  work on eating a healthy diet and exercise.  Labs drawn today.    Orders: -     Lipid panel -     TSH  Stage 3a chronic kidney disease (HCC) Assessment & Plan: Stable. Rule out wegener's.   Muscle pain Assessment & Plan: Check labs  Orders: -     CK  Gastroesophageal reflux disease without esophagitis Assessment & Plan: Continue protonix 40 mg daily.    Immunization due -     Flu Vaccine Trivalent High Dose (Fluad)     No orders of the defined types were placed in this encounter.   Orders Placed This Encounter  Procedures   Flu Vaccine Trivalent High Dose (Fluad)   CBC with Differential/Platelet   Comprehensive metabolic panel   Lipid panel   TSH   Sedimentation rate   C-reactive protein   ANCA Profile (RDL)   Angiotensin converting enzyme   CK     Follow-up: Return in about 6 weeks (around 02/03/2023) for chronic follow up.  Total time spent on today's visit was greater than 40 minutes, including both face-to-face time and nonface-to-face time personally spent on review of chart (labs and imaging), discussing labs and goals, discussing further work-up, treatment options, referrals to specialist if needed, reviewing outside records of pertinent, answering patient's questions, and coordinating care.  I,Katherina A Bramblett,acting as a scribe for Blane Ohara, MD.,have documented all relevant documentation on the  behalf of Blane Ohara, MD,as directed by  Blane Ohara, MD while in the presence of Blane Ohara, MD.   Clayborn Bigness I Leal-Borjas,acting as a scribe for Blane Ohara, MD.,have documented all relevant documentation on the behalf of Blane Ohara, MD,as directed by  Blane Ohara, MD while in the presence of Blane Ohara, MD.    An After Visit Summary was printed and given to the patient.  Blane Ohara, MD [facility] [phone]

## 2022-12-27 DIAGNOSIS — N1831 Chronic kidney disease, stage 3a: Secondary | ICD-10-CM

## 2022-12-27 DIAGNOSIS — M791 Myalgia, unspecified site: Secondary | ICD-10-CM | POA: Insufficient documentation

## 2022-12-27 HISTORY — DX: Chronic kidney disease, stage 3a: N18.31

## 2022-12-27 NOTE — Assessment & Plan Note (Signed)
Check labs 

## 2022-12-27 NOTE — Assessment & Plan Note (Signed)
Increase amlodipine to 5 mg daily.  Bring bp log in 2 weeks.

## 2022-12-27 NOTE — Assessment & Plan Note (Addendum)
Well controlled.  No changes to medicines. Fish oil 1 tablet twice daily. Zetia 10 mg daily.  Continue to work on eating a healthy diet and exercise.  Labs drawn today.

## 2022-12-27 NOTE — Assessment & Plan Note (Signed)
Continue protonix 40mg  daily.

## 2022-12-28 DIAGNOSIS — Z23 Encounter for immunization: Secondary | ICD-10-CM | POA: Insufficient documentation

## 2022-12-28 HISTORY — DX: Encounter for immunization: Z23

## 2022-12-28 NOTE — Assessment & Plan Note (Signed)
Stable. Rule out wegener's.

## 2022-12-28 NOTE — Assessment & Plan Note (Signed)
Noncardiac. Related to dypsnea.

## 2022-12-30 NOTE — Progress Notes (Unsigned)
CPET ordered

## 2022-12-31 ENCOUNTER — Other Ambulatory Visit: Payer: Self-pay

## 2022-12-31 ENCOUNTER — Encounter: Payer: Self-pay | Admitting: Cardiology

## 2022-12-31 ENCOUNTER — Ambulatory Visit: Payer: Medicare HMO | Attending: Cardiology | Admitting: Cardiology

## 2022-12-31 VITALS — BP 128/80 | HR 89 | Ht 72.0 in | Wt 203.8 lb

## 2022-12-31 DIAGNOSIS — R079 Chest pain, unspecified: Secondary | ICD-10-CM | POA: Diagnosis not present

## 2022-12-31 DIAGNOSIS — I119 Hypertensive heart disease without heart failure: Secondary | ICD-10-CM | POA: Diagnosis not present

## 2022-12-31 DIAGNOSIS — R0602 Shortness of breath: Secondary | ICD-10-CM | POA: Diagnosis not present

## 2022-12-31 DIAGNOSIS — I25118 Atherosclerotic heart disease of native coronary artery with other forms of angina pectoris: Secondary | ICD-10-CM

## 2022-12-31 MED ORDER — EZETIMIBE 10 MG PO TABS: 10.0000 mg | ORAL_TABLET | Freq: Every day | ORAL | 1 refills | Status: DC

## 2022-12-31 MED ORDER — CLONIDINE HCL 0.3 MG PO TABS: 0.3000 mg | ORAL_TABLET | Freq: Every day | ORAL | 1 refills | Status: DC

## 2022-12-31 NOTE — Patient Instructions (Signed)
Medication Instructions:  Your physician recommends that you continue on your current medications as directed. Please refer to the Current Medication list given to you today.  *If you need a refill on your cardiac medications before your next appointment, please call your pharmacy*   Lab Work: None If you have labs (blood work) drawn today and your tests are completely normal, you will receive your results only by: MyChart Message (if you have MyChart) OR A paper copy in the mail If you have any lab test that is abnormal or we need to change your treatment, we will call you to review the results.   Testing/Procedures: None   Follow-Up: At [facility], you and your health needs are our priority.  As part of our continuing mission to provide you with exceptional heart care, we have created designated Provider Care Teams.  These Care Teams include your primary Cardiologist (physician) and Advanced Practice Providers (APPs -  Physician Assistants and Nurse Practitioners) who all work together to provide you with the care you need, when you need it.  We recommend signing up for the patient portal called "MyChart".  Sign up information is provided on this After Visit Summary.  MyChart is used to connect with patients for Virtual Visits (Telemedicine).  Patients are able to view lab/test results, encounter notes, upcoming appointments, etc.  Non-urgent messages can be sent to your provider as well.   To learn more about what you can do with MyChart, go to [email or web address].    Your next appointment:   6 month(s)  Provider:   Brian Munley, MD    Other Instructions None  

## 2023-01-02 LAB — CBC WITH DIFFERENTIAL/PLATELET
Basophils Absolute: 0.1 10*3/uL (ref 0.0–0.2)
Basos: 1 %
EOS (ABSOLUTE): 0.1 10*3/uL (ref 0.0–0.4)
Eos: 2 %
Hematocrit: 43.2 % (ref 37.5–51.0)
Hemoglobin: 13.9 g/dL (ref 13.0–17.7)
Immature Grans (Abs): 0 10*3/uL (ref 0.0–0.1)
Immature Granulocytes: 0 %
Lymphocytes Absolute: 2.3 10*3/uL (ref 0.7–3.1)
Lymphs: 25 %
MCH: 25.1 pg — ABNORMAL LOW (ref 26.6–33.0)
MCHC: 32.2 g/dL (ref 31.5–35.7)
MCV: 78 fL — ABNORMAL LOW (ref 79–97)
Monocytes Absolute: 1.1 10*3/uL — ABNORMAL HIGH (ref 0.1–0.9)
Monocytes: 12 %
Neutrophils Absolute: 5.6 10*3/uL (ref 1.4–7.0)
Neutrophils: 60 %
Platelets: 295 10*3/uL (ref 150–450)
RBC: 5.53 x10E6/uL (ref 4.14–5.80)
RDW: 16.5 % — ABNORMAL HIGH (ref 11.6–15.4)
WBC: 9.2 10*3/uL (ref 3.4–10.8)

## 2023-01-02 LAB — LIPID PANEL
Chol/HDL Ratio: 2.8 ratio (ref 0.0–5.0)
Cholesterol, Total: 111 mg/dL (ref 100–199)
HDL: 40 mg/dL (ref >39)
LDL Chol Calc (NIH): 42 mg/dL (ref 0–99)
Triglycerides: 172 mg/dL — ABNORMAL HIGH (ref 0–149)
VLDL Cholesterol Cal: 29 mg/dL (ref 5–40)

## 2023-01-02 LAB — COMPREHENSIVE METABOLIC PANEL
ALT: 18 IU/L (ref 0–44)
AST: 29 IU/L (ref 0–40)
Albumin: 4.7 g/dL (ref 3.9–4.9)
Alkaline Phosphatase: 141 IU/L — ABNORMAL HIGH (ref 44–121)
BUN/Creatinine Ratio: 9 — ABNORMAL LOW (ref 10–24)
BUN: 11 mg/dL (ref 8–27)
Bilirubin Total: 0.4 mg/dL (ref 0.0–1.2)
CO2: 23 mmol/L (ref 20–29)
Calcium: 10.2 mg/dL (ref 8.6–10.2)
Chloride: 100 mmol/L (ref 96–106)
Creatinine, Ser: 1.29 mg/dL — ABNORMAL HIGH (ref 0.76–1.27)
Globulin, Total: 2.7 g/dL (ref 1.5–4.5)
Glucose: 77 mg/dL (ref 70–99)
Potassium: 3.8 mmol/L (ref 3.5–5.2)
Sodium: 142 mmol/L (ref 134–144)
Total Protein: 7.4 g/dL (ref 6.0–8.5)
eGFR: 60 mL/min/{1.73_m2} (ref >59)

## 2023-01-02 LAB — TSH: TSH: 1.84 u[IU]/mL (ref 0.450–4.500)

## 2023-01-02 LAB — C-REACTIVE PROTEIN: CRP: 1 mg/L (ref 0–10)

## 2023-01-02 LAB — ANGIOTENSIN CONVERTING ENZYME: Angio Convert Enzyme: 37 U/L (ref 14–82)

## 2023-01-02 LAB — ANCA PROFILE (RDL)
ANCA by IFA (RDL): NEGATIVE
Anti-MPO Ab (RDL): <20 Units (ref <20)
Anti-PR-3 Ab (RDL): <20 Units (ref <20)

## 2023-01-02 LAB — SEDIMENTATION RATE: Sed Rate: 63 mm/hr — ABNORMAL HIGH (ref 0–30)

## 2023-01-05 ENCOUNTER — Other Ambulatory Visit: Payer: Self-pay

## 2023-01-05 ENCOUNTER — Ambulatory Visit (HOSPITAL_COMMUNITY): Payer: Medicare HMO | Attending: Cardiology

## 2023-01-05 ENCOUNTER — Telehealth: Payer: Self-pay | Admitting: Pulmonary Disease

## 2023-01-05 DIAGNOSIS — R0602 Shortness of breath: Secondary | ICD-10-CM | POA: Insufficient documentation

## 2023-01-05 DIAGNOSIS — I119 Hypertensive heart disease without heart failure: Secondary | ICD-10-CM | POA: Insufficient documentation

## 2023-01-05 DIAGNOSIS — J984 Other disorders of lung: Secondary | ICD-10-CM | POA: Insufficient documentation

## 2023-01-05 DIAGNOSIS — I25118 Atherosclerotic heart disease of native coronary artery with other forms of angina pectoris: Secondary | ICD-10-CM | POA: Diagnosis not present

## 2023-01-05 DIAGNOSIS — R0600 Dyspnea, unspecified: Secondary | ICD-10-CM | POA: Diagnosis not present

## 2023-01-05 MED ORDER — PREDNISONE 20 MG PO TABS: 20.0000 mg | ORAL_TABLET | Freq: Every day | ORAL | 0 refills | Status: DC

## 2023-01-05 NOTE — Telephone Encounter (Signed)
Left msg for pt to call and make first avail appt w/NP per Dr. Francine Graven. Let Dr. Ashley Mariner we have no 1-4 week ap[pts avail/. As of now it would be a 9 week appt/.

## 2023-01-05 NOTE — Telephone Encounter (Signed)
Please schedule patient with APP in the next 1-4 weeks as his primary care physician requested be seen in the near future for progressive shortness of breath.  Thanks, JD

## 2023-01-06 ENCOUNTER — Other Ambulatory Visit: Payer: Self-pay | Admitting: Family Medicine

## 2023-01-06 DIAGNOSIS — R0609 Other forms of dyspnea: Secondary | ICD-10-CM

## 2023-01-07 NOTE — Telephone Encounter (Signed)
He is sched. NFN

## 2023-01-08 ENCOUNTER — Other Ambulatory Visit: Payer: Self-pay | Admitting: Family Medicine

## 2023-01-08 DIAGNOSIS — R0609 Other forms of dyspnea: Secondary | ICD-10-CM

## 2023-01-13 DIAGNOSIS — I700 Atherosclerosis of aorta: Secondary | ICD-10-CM | POA: Diagnosis not present

## 2023-01-13 DIAGNOSIS — K449 Diaphragmatic hernia without obstruction or gangrene: Secondary | ICD-10-CM | POA: Diagnosis not present

## 2023-01-13 DIAGNOSIS — N200 Calculus of kidney: Secondary | ICD-10-CM | POA: Diagnosis not present

## 2023-01-13 DIAGNOSIS — I7121 Aneurysm of the ascending aorta, without rupture: Secondary | ICD-10-CM | POA: Diagnosis not present

## 2023-01-13 DIAGNOSIS — R0602 Shortness of breath: Secondary | ICD-10-CM | POA: Diagnosis not present

## 2023-01-13 DIAGNOSIS — R0609 Other forms of dyspnea: Secondary | ICD-10-CM | POA: Diagnosis not present

## 2023-01-27 ENCOUNTER — Encounter: Payer: Self-pay | Admitting: Family Medicine

## 2023-02-02 ENCOUNTER — Ambulatory Visit (INDEPENDENT_AMBULATORY_CARE_PROVIDER_SITE_OTHER): Payer: Medicare HMO | Admitting: Family Medicine

## 2023-02-02 VITALS — BP 134/76 | HR 72 | Temp 96.9°F | Resp 18 | Ht 72.0 in | Wt 200.6 lb

## 2023-02-02 DIAGNOSIS — R0609 Other forms of dyspnea: Secondary | ICD-10-CM

## 2023-02-02 DIAGNOSIS — I119 Hypertensive heart disease without heart failure: Secondary | ICD-10-CM | POA: Diagnosis not present

## 2023-02-02 DIAGNOSIS — I7121 Aneurysm of the ascending aorta, without rupture: Secondary | ICD-10-CM | POA: Diagnosis not present

## 2023-02-02 DIAGNOSIS — K219 Gastro-esophageal reflux disease without esophagitis: Secondary | ICD-10-CM | POA: Diagnosis not present

## 2023-02-02 NOTE — Patient Instructions (Signed)
Recommend increase amlodipine to 2.5 mg twice daily.   Refer to Dr. Cliffton Asters for monitoring thoracic aneurysm.

## 2023-02-02 NOTE — Progress Notes (Unsigned)
Subjective:  Patient ID: Joe Arroyo, male    DOB: [birth year]  Age: 67 y.o. MRN: [id number]  No chief complaint on file.   HPI HYPERTENSION:  on clonidine 0.3 mg once daily and Norvasc 2.5 mg daily.  Bps in am range from 120/70-150/96 and pm 116/67-170/96. Bps have improved in the last week, but still up.   Patient states his shob is worsening. SpO2 is 99% States this has been ongoing for 2 years will have BL arm pain and chest pain due to the shob. Has seen 2 pulmonologist. Has appt with one is [place] 01/2023. SHORTNESS OF BREATH through out the day. Thorough pulmonary work up. Has been on numerous inhalers, none of which have helped. LAD stent did not resolve symptoms. ECHo and holtor monitors do not show a source of dypsnea.      12/23/2022   11:37 AM 11/11/2022    2:44 PM 09/16/2022    9:23 AM 09/12/2021    8:45 AM 08/01/2021    2:16 PM  Depression screen PHQ 2/9  Decreased Interest 1 0 0 0 0  Down, Depressed, Hopeless 0 0 0 0 0  PHQ - 2 Score 1 0 0 0 0  Altered sleeping 0 0 0    Tired, decreased energy 1 0 1    Change in appetite 0 0 0    Feeling bad or failure about yourself  0 0 0    Trouble concentrating 0 0 0    Moving slowly or fidgety/restless 0 0 0    Suicidal thoughts 0 0 0    PHQ-9 Score 2 0 1    Difficult doing work/chores Somewhat difficult Not difficult at all Not difficult at all          11/11/2022    2:45 PM  Fall Risk   Falls in the past year? 0  Number falls in past yr: 0  Injury with Fall? 0  Risk for fall due to : No Fall Risks  Follow up Falls prevention discussed    Patient Care Team: Blane Ohara, MD as PCP - General (Internal Medicine) Baldo Daub, MD as PCP - Cardiology (Cardiology) Blane Ohara, MD as Referring Physician (Internal Medicine) Baldo Daub, MD as Consulting Physician (Cardiology) Martina Sinner, MD as Consulting Physician (Pulmonary Disease) Debroah Baller, MD as Consulting Physician (Urology)   Review of  Systems  Current Outpatient Medications on File Prior to Visit  Medication Sig Dispense Refill   amLODipine (NORVASC) 2.5 MG tablet Take 1 tablet (2.5 mg total) by mouth daily. for blood pressure 90 tablet 1   azelastine (ASTELIN) 0.1 % nasal spray 1-2 sprays in each nostril twice a day as needed 30 mL 5   BREO ELLIPTA 100-25 MCG/ACT AEPB Inhale 1 puff into the lungs daily.     cloNIDine (CATAPRES) 0.3 MG tablet Take 1 tablet (0.3 mg total) by mouth daily. 90 tablet 1   clopidogrel (PLAVIX) 75 MG tablet TAKE ONE TABLET BY MOUTH DAILY 90 tablet 1   esomeprazole (NEXIUM) 10 MG packet Take 10 mg by mouth daily before breakfast.     ezetimibe (ZETIA) 10 MG tablet Take 1 tablet (10 mg total) by mouth daily. 90 tablet 1   finasteride (PROSCAR) 5 MG tablet Take 5 mg by mouth daily.     fluticasone (FLONASE) 50 MCG/ACT nasal spray 1-2 sprays in each nostril daily 16 g 5   furosemide (LASIX) 40 MG tablet Take 1 tablet (40 mg total) by  mouth 2 (two) times daily. 180 tablet 1   ipratropium (ATROVENT) 0.06 % nasal spray 1-2 sprays in each nostril up to 3 times a day as needed for runny nose/post nasal drip/drainage 15 mL 5   levocetirizine (XYZAL) 5 MG tablet Take 1 tablet (5 mg total) by mouth every evening. 30 tablet 5   magnesium oxide (MAG-OX) 400 (240 Mg) MG tablet Take 400 mg by mouth daily.     montelukast (SINGULAIR) 10 MG tablet Take 1 tablet (10 mg total) by mouth at bedtime. 30 tablet 5   Multiple Vitamins-Minerals (CENTRUM SILVER ULTRA MENS PO) Take 1 tablet by mouth daily.     Omega-3 Fatty Acids (FISH OIL PO) Take 1 tablet by mouth 2 (two) times daily.     predniSONE (DELTASONE) 20 MG tablet Take 1 tablet (20 mg total) by mouth daily with breakfast. 30 tablet 0   rosuvastatin (CRESTOR) 40 MG tablet Take 1 tablet (40 mg total) by mouth daily. 90 tablet 0   zinc gluconate 50 MG tablet Take 50 mg by mouth daily.     No current facility-administered medications on file prior to visit.   Past  Medical History:  Diagnosis Date   Abnormal cardiac CT angiography    Atypical chest pain 01/07/2021   Chronic idiopathic constipation 01/07/2021   Coronary artery disease    Dyspnea on exertion 09/05/2019   Essential hypertension, benign 05/14/2018   GERD (gastroesophageal reflux disease) 09/28/2020   Hiatal hernia 09/28/2020   History of hiatal hernia    History of kidney stones    Hyperlipidemia 05/14/2018   Hypertensive heart disease 05/14/2018   09/17/2021: SUMMARY  Severe single-vessel disease as indicated by Coronary CTA with RPL 2 having tandem 90% and 80% stenoses (CT FFR positive)-this vessel is at least the same size as the LAD  Successful DES PCI of RPL 2 covering both lesions with a Synergy DES 2.25 mm x 20 mm postdilated in tapered fashion from 2.5 to 2.3 mm.)  Otherwise minimal CAD with relatively small caliber left coronary syst   Non-seasonal allergic rhinitis due to pollen 08/16/2019   OSA (obstructive sleep apnea) 09/28/2020   Other chronic sinusitis 05/02/2022   Other fatigue 03/17/2022   Other seasonal allergic rhinitis 05/02/2022   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   Shortness of breath 09/05/2019   Sinus bradycardia 05/14/2018   Syncope 09/05/2019   Thoracic ascending aortic aneurysm (HCC)    ascending TAA 4.6 x 4.4 cm 10/03/20 CTA chest   Past Surgical History:  Procedure Laterality Date   COLONOSCOPY     CORONARY STENT INTERVENTION N/A 10/07/2021   Procedure: CORONARY STENT INTERVENTION;  Surgeon: Marykay Lex, MD;  [facility] INVASIVE CV LAB;  Service: Cardiovascular;  Laterality: N/A;   ESOPHAGOGASTRODUODENOSCOPY N/A 12/05/2020   Procedure: ESOPHAGOGASTRODUODENOSCOPY (EGD);  Surgeon: Corliss Skains, MD;  Location: [facility] OR;  Service: Thoracic;  Laterality: N/A;   LAMINECTOMY     MOLE REMOVAL     PROSTATE BIOPSY     RIGHT/LEFT HEART CATH AND CORONARY ANGIOGRAPHY N/A 10/07/2021   Procedure: RIGHT/LEFT HEART CATH AND CORONARY ANGIOGRAPHY;  Surgeon:  Marykay Lex, MD;  Location: [facility] INVASIVE CV LAB;  Service: Cardiovascular;  Laterality: N/A;   TONSILLECTOMY AND ADENOIDECTOMY     UPPER GASTROINTESTINAL ENDOSCOPY     XI ROBOTIC ASSISTED PARAESOPHAGEAL HERNIA REPAIR N/A 12/05/2020   Procedure: XI ROBOTIC ASSISTED PARAESOPHAGEAL HERNIA REPAIR AND FUNDOPLICATION;  Surgeon: Corliss Skains, MD;  Location: [facility] OR;  Service: Thoracic;  Laterality: N/A;    Family History  Problem Relation Age of Onset   Hyperlipidemia Mother    Aortic stenosis Mother    Valvular heart disease Mother    Colon cancer Mother    Diabetes Mellitus II Mother    Cancer - Colon Mother    Cancer Mother    Stroke Mother    Cerebrovascular Accident Mother    Diabetes Mother    Hypertension Mother    Hyperlipidemia Father    Diabetes Mellitus II Father    Diabetes Father    Alzheimer's disease Father    Esophageal cancer Paternal Uncle    CAD Maternal Grandmother    Congestive Heart Failure Maternal Grandmother    Heart attack Maternal Grandmother    Heart failure Maternal Grandmother    Coronary artery disease Maternal Grandmother    Hyperlipidemia Maternal Grandmother    Social History   Socioeconomic History   Marital status: Single    Spouse name: Not on file   Number of children: Not on file   Years of education: Not on file   Highest education level: Not on file  Occupational History   Not on file  Tobacco Use   Smoking status: Never   Smokeless tobacco: Never  Vaping Use   Vaping status: Never Used  Substance and Sexual Activity   Alcohol use: Yes    Comment: occasional   Drug use: Never   Sexual activity: Not on file  Other Topics Concern   Not on file  Social History Narrative   Not on file   Social Determinants of Health   Financial Resource Strain: Low Risk  (11/11/2022)   Overall Financial Resource Strain (CARDIA)    Difficulty of Paying Living Expenses: Not hard at all  Food Insecurity: No Food Insecurity (11/11/2022)    Hunger Vital Sign    Worried About Running Out of Food in the Last Year: Never true    Ran Out of Food in the Last Year: Never true  Transportation Needs: No Transportation Needs (11/11/2022)   PRAPARE - Administrator, Civil Service (Medical): No    Lack of Transportation (Non-Medical): No  Physical Activity: Sufficiently Active (11/11/2022)   Exercise Vital Sign    Days of Exercise per Week: 5 days    Minutes of Exercise per Session: 30 min  Stress: No Stress Concern Present (11/11/2022)   [employer] of Occupational Health - Occupational Stress Questionnaire    Feeling of Stress : Not at all  Social Connections: Socially Isolated (11/11/2022)   Social Connection and Isolation Panel [NHANES]    Frequency of Communication with Friends and Family: More than three times a week    Frequency of Social Gatherings with Friends and Family: More than three times a week    Attends Religious Services: Never    Database administrator or Organizations: No    Attends Banker Meetings: Never    Marital Status: Never married    Objective:  There were no vitals taken for this visit.     12/31/2022    9:46 AM 12/23/2022   11:35 AM 11/11/2022    2:38 PM  BP/Weight  Systolic BP 128 134 --  Diastolic BP 80 80 --  Wt. (Lbs) 203.8 201 200  BMI 27.64 kg/m2 27.26 kg/m2 25.8 kg/m2    Physical Exam  Diabetic Foot Exam - Simple   No data filed      Lab Results  Component Value Date  WBC 9.2 12/23/2022   HGB 13.9 12/23/2022   HCT 43.2 12/23/2022   PLT 295 12/23/2022   GLUCOSE 77 12/23/2022   CHOL 111 12/23/2022   TRIG 172 (H) 12/23/2022   HDL 40 12/23/2022   LDLCALC 42 12/23/2022   ALT 18 12/23/2022   AST 29 12/23/2022   NA 142 12/23/2022   K 3.8 12/23/2022   CL 100 12/23/2022   CREATININE 1.29 (H) 12/23/2022   BUN 11 12/23/2022   CO2 23 12/23/2022   TSH 1.840 12/23/2022   INR 1.0 12/03/2020      Assessment & Plan:    There are no diagnoses linked  to this encounter.   No orders of the defined types were placed in this encounter.   No orders of the defined types were placed in this encounter.    Follow-up: No follow-ups on file.   I,Lacosta Hargan M Avangelina Flight,acting as a Neurosurgeon for Blane Ohara, MD.,have documented all relevant documentation on the behalf of Blane Ohara, MD,as directed by  Blane Ohara, MD while in the presence of Blane Ohara, MD.   An After Visit Summary was printed and given to the patient.  Blane Ohara, MD [facility] [phone]

## 2023-02-06 DIAGNOSIS — R0609 Other forms of dyspnea: Secondary | ICD-10-CM | POA: Diagnosis not present

## 2023-02-06 DIAGNOSIS — J383 Other diseases of vocal cords: Secondary | ICD-10-CM | POA: Diagnosis not present

## 2023-02-06 DIAGNOSIS — J4541 Moderate persistent asthma with (acute) exacerbation: Secondary | ICD-10-CM | POA: Diagnosis not present

## 2023-02-08 ENCOUNTER — Encounter: Payer: Self-pay | Admitting: Family Medicine

## 2023-02-08 NOTE — Assessment & Plan Note (Signed)
Referred to Dr. Delford Field ENT at the end of 12/2022. I left a message for Dr. Delford Field to review chart.

## 2023-02-08 NOTE — Assessment & Plan Note (Signed)
Refer to Dr. Cliffton Asters for monitoring thoracic aneurysm.

## 2023-02-08 NOTE — Assessment & Plan Note (Signed)
Continue protonix 40mg daily.  

## 2023-02-08 NOTE — Assessment & Plan Note (Signed)
Well controlled.  No changes to medicines. Continue on clonidine 0.3 mg once daily and Norvasc 2.5 mg twice daily.

## 2023-02-09 DIAGNOSIS — R0609 Other forms of dyspnea: Secondary | ICD-10-CM | POA: Diagnosis not present

## 2023-02-16 ENCOUNTER — Other Ambulatory Visit: Payer: Self-pay

## 2023-02-16 DIAGNOSIS — E782 Mixed hyperlipidemia: Secondary | ICD-10-CM

## 2023-02-16 MED ORDER — ROSUVASTATIN CALCIUM 40 MG PO TABS: 40.0000 mg | ORAL_TABLET | Freq: Every day | ORAL | 0 refills | Status: DC

## 2023-02-20 ENCOUNTER — Ambulatory Visit: Payer: Medicare HMO | Admitting: Primary Care

## 2023-03-06 ENCOUNTER — Encounter: Payer: Self-pay | Admitting: Internal Medicine

## 2023-03-06 ENCOUNTER — Ambulatory Visit: Payer: Medicare HMO | Admitting: Internal Medicine

## 2023-03-06 VITALS — BP 126/84 | HR 108 | Resp 16

## 2023-03-06 DIAGNOSIS — I119 Hypertensive heart disease without heart failure: Secondary | ICD-10-CM | POA: Diagnosis not present

## 2023-03-06 DIAGNOSIS — J3089 Other allergic rhinitis: Secondary | ICD-10-CM | POA: Diagnosis not present

## 2023-03-06 DIAGNOSIS — K219 Gastro-esophageal reflux disease without esophagitis: Secondary | ICD-10-CM | POA: Diagnosis not present

## 2023-03-06 DIAGNOSIS — R0609 Other forms of dyspnea: Secondary | ICD-10-CM

## 2023-03-06 DIAGNOSIS — J328 Other chronic sinusitis: Secondary | ICD-10-CM | POA: Diagnosis not present

## 2023-03-06 DIAGNOSIS — Z8669 Personal history of other diseases of the nervous system and sense organs: Secondary | ICD-10-CM

## 2023-03-06 DIAGNOSIS — I2510 Atherosclerotic heart disease of native coronary artery without angina pectoris: Secondary | ICD-10-CM | POA: Diagnosis not present

## 2023-03-06 NOTE — Patient Instructions (Addendum)
Allergy testing previously showed positive to grass pollen and weed pollen  CT Scan : mild CRS and deviated septum   We will refer you to neurology to evaluate for neuromuscular causes, headaches and dizziness   Reach out to Dr. Christell Constant and let her know about the reactions to kenalog and Azithromycin  Christell Constant Shona Simpson, MD  [address]  [place], [place] [address]  [phone] (Work)  [phone] (Fax)   Seasonal allergic Rhinitis with chronic sinusitis   - Continue allergy avoidance measures to pollen   - Continue Nasal Steroid Spray: Options include Flonase (fluticasone), Nasocort (triamcinolone), Nasonex (mometasome) 1- 2 sprays in each nostril daily (can buy over-the-counter if not covered by insurance)  Best results if used daily. - Continue Astelin (Azelastine) 1-2 sprays in each nostril twice a day as needed.  You may use this as needed for nasal congestion/itchy ears/itchy nose if desired - Continue Atrovent (Ipratropium Bromide) 1-2 sprays in each nostril up to 3 times a day as needed for runny nose/post nasal drip/drainage.  Use less frequently if airway gets too dry. - Continue Singulair (Montelukast) 10mg  nightly. - Continue over the counter antihistamine daily or daily as needed.   -Your options include Zyrtec (Cetirizine) 10mg , Claritin (Loratadine) 10mg , Allegra (Fexofenadine) 180mg , or Xyzal (Levocetirinze) 5mg   Follow up: 6 months  Thank you so much for letting me partake in your care today.  Don't hesitate to reach out if you have any additional concerns!  [name], MD  [facility] [place], [place]

## 2023-03-06 NOTE — Progress Notes (Signed)
Follow Up Note  RE: Joe Arroyo MRN: [id number] DOB: 1955-06-10 Date of Office Visit: 03/06/2023  Referring provider: Blane Ohara, MD Primary care provider: Blane Ohara, MD  Chief Complaint: Allergies, Sleep Apnea, Gastroesophageal Reflux, and Shortness of Breath  History of Present Illness: I had the pleasure of seeing Joe Arroyo for a follow up visit at the [facility] on 03/06/2023. He is a 67 y.o. male with complicated cardiopulmonary history who is being followed for CRS, allergic rhinitis. His previous allergy office visit was on 09/05/22 with Dr. [name]. Today is a regular follow up visit.  History obtained from patient, chart review.   Today he reports nasal symptoms are well controlled with Singulair 10 mg daily.  He has not needed any of his nose sprays since Oct, but this is likely due to ending of allergy seasone.  He was seen by Dr. Christell Constant at [facility] pulm (see below).  He had an initial response to Kenalog injection for a few days then felt like he was "going to pop."  Also reports similar symptoms with azithromycin.  He has not seena neurologist yet for current complaints.  Does complain of fatigue, decreased grip strength and muscle weakness.  He is scheduled for GI for EGD for GERD evaluation.    He had his CPEX study :01/05/2023 which showed mild to moderate functional impairment but no heart limitation. They felt the patient was primarily ventilatory limited with restrictive lung patterns and preexercise spirometry.   Please see previous note from 04/21/2022 for full summary of cardiac and pulmonary workup. Speech/ENT 02/06/23: "The vocal folds were moderately bowed, consistent with a diagnosis of age-related vocal fold atrophy. This was an incidental finding as vocal complaints were denied. While complete vocal fold abduction and adduction was observed during certain tasks (ee-sniff), the most consistent laryngeal posture during respiration was in a  partially adducted position. This remained as such for both inhalation and exhalation and was felt to reflect attempts to manage air exchange at the level of the lungs. Findings were not consistent with a diagnosis of vocal cord dysfunction (VCD)/inducible laryngeal obstruction (ILO)"   Pulm 02/06/23: "I suspect this is adult onset small airways asthma phenotype. There is no evidence for ILD with stable lung function and stable CT scans over a year and a half at least. I think that he has hyperinflation as his primary symptom of asthma. He has been treated with dry powder inhalers and they have not been to get to the small airways. His primary care provider did do a lower dose prolonged prednisone trial without any clinical benefit per the patient but I do think it is worthwhile to do a second steroid characterization using a Kenalog injection. He is amenable to this. If he does not respond to IM steroids and really do not think prescribing it inhaled steroid makes a lot of sense especially since most of his preferred products are DPI's and we would need her UltraFine aerosol such as Qvar.  I do think that he has chronic mucous plugging and it is worthwhile to try 30 days of azithromycin and a flutter device for pulmonary toilet. There is respiratory bronchiolitis that is seen in Asian smoking men that responds to macrolide therapy.  Our plan was to hold off on any inhalers at this time given the expense and to reassess where we are after the steroid trial and the macrolide trial. "  Assessment and Plan: [name] is a 67 y.o. male with:  Other allergic rhinitis  Other chronic sinusitis  Dyspnea on exertion - Plan: Spirometry with Graph  Gastroesophageal reflux disease without esophagitis  Hypertensive heart disease without heart failure  Coronary artery disease involving native coronary artery of native heart without angina pectoris  Hx of migraines   Plan: Patient Instructions  Allergy  testing previously showed positive to grass pollen and weed pollen  CT Scan : mild CRS and deviated septum   We will refer you to neurology to evaluate for neuromuscular causes, headaches and dizziness   Reach out to Dr. Christell Constant and let her know about the reactions to kenalog and Azithromycin  Christell Constant Shona Simpson, MD  [address]  [place], [place] [address]  [phone] (Work)  [phone] (Fax)   Seasonal allergic Rhinitis with chronic sinusitis   - Continue allergy avoidance measures to pollen   - Continue Nasal Steroid Spray: Options include Flonase (fluticasone), Nasocort (triamcinolone), Nasonex (mometasome) 1- 2 sprays in each nostril daily (can buy over-the-counter if not covered by insurance)  Best results if used daily. - Continue Astelin (Azelastine) 1-2 sprays in each nostril twice a day as needed.  You may use this as needed for nasal congestion/itchy ears/itchy nose if desired - Continue Atrovent (Ipratropium Bromide) 1-2 sprays in each nostril up to 3 times a day as needed for runny nose/post nasal drip/drainage.  Use less frequently if airway gets too dry. - Continue Singulair (Montelukast) 10mg  nightly. - Continue over the counter antihistamine daily or daily as needed.   -Your options include Zyrtec (Cetirizine) 10mg , Claritin (Loratadine) 10mg , Allegra (Fexofenadine) 180mg , or Xyzal (Levocetirinze) 5mg   Follow up: 6 months  Thank you so much for letting me partake in your care today.  Don't hesitate to reach out if you have any additional concerns!  [name], MD  [facility] [place] [place]   No orders of the defined types were placed in this encounter.   Lab Orders  No laboratory test(s) ordered today   Diagnostics: Spirometry:  Tracings reviewed. His effort: It was hard to get consistent efforts and there is a question as to whether this reflects a maximal maneuver. FVC: 3.10L FEV1: 2.53L, 73% predicted FEV1/FVC ratio:  82% Interpretation:  nonobstructive ratio, reduced FVC  .  Please see scanned spirometry results for details.   Medication List:  Current Outpatient Medications  Medication Sig Dispense Refill   amLODipine (NORVASC) 2.5 MG tablet Take 1 tablet (2.5 mg total) by mouth daily. for blood pressure 90 tablet 1   azelastine (ASTELIN) 0.1 % nasal spray 1-2 sprays in each nostril twice a day as needed 30 mL 5   BREO ELLIPTA 100-25 MCG/ACT AEPB Inhale 1 puff into the lungs daily.     cloNIDine (CATAPRES) 0.3 MG tablet Take 1 tablet (0.3 mg total) by mouth daily. 90 tablet 1   clopidogrel (PLAVIX) 75 MG tablet TAKE ONE TABLET BY MOUTH DAILY 90 tablet 1   esomeprazole (NEXIUM) 10 MG packet Take 10 mg by mouth daily before breakfast.     ezetimibe (ZETIA) 10 MG tablet Take 1 tablet (10 mg total) by mouth daily. 90 tablet 1   finasteride (PROSCAR) 5 MG tablet Take 5 mg by mouth daily.     fluticasone (FLONASE) 50 MCG/ACT nasal spray 1-2 sprays in each nostril daily 16 g 5   furosemide (LASIX) 40 MG tablet Take 1 tablet (40 mg total) by mouth 2 (two) times daily. 180 tablet 1   ipratropium (ATROVENT) 0.06 % nasal  spray 1-2 sprays in each nostril up to 3 times a day as needed for runny nose/post nasal drip/drainage 15 mL 5   levocetirizine (XYZAL) 5 MG tablet Take 1 tablet (5 mg total) by mouth every evening. 30 tablet 5   magnesium oxide (MAG-OX) 400 (240 Mg) MG tablet Take 400 mg by mouth daily.     montelukast (SINGULAIR) 10 MG tablet Take 1 tablet (10 mg total) by mouth at bedtime. 30 tablet 5   Multiple Vitamins-Minerals (CENTRUM SILVER ULTRA MENS PO) Take 1 tablet by mouth daily.     Omega-3 Fatty Acids (FISH OIL PO) Take 1 tablet by mouth 2 (two) times daily.     rosuvastatin (CRESTOR) 40 MG tablet Take 1 tablet (40 mg total) by mouth daily. 90 tablet 0   zinc gluconate 50 MG tablet Take 50 mg by mouth daily.     No current facility-administered medications for this visit.   Allergies: Allergies   Allergen Reactions   Nitroglycerin Other (See Comments)    Severe hypotension, caused 100 point drop in systolic BP   Beta Adrenergic Blockers Other (See Comments)    bradycardia   Bystolic [Nebivolol Hcl] Other (See Comments)    bradycardia   Carvedilol Other (See Comments)    bradycardia   Pravastatin Other (See Comments)    "made my joints hurt"   Tamsulosin Other (See Comments)    Syncope   Hctz [Hydrochlorothiazide] Rash   Hydralazine Rash   Penicillins Rash   I reviewed his past medical history, social history, family history, and environmental history and no significant changes have been reported from his previous visit.  ROS: All others negative except as noted per HPI.   Objective: BP 126/84   Pulse (!) 108   Resp 16   SpO2 98%  There is no height or weight on file to calculate BMI. General Appearance:  Alert, cooperative, no distress, appears stated age  Head:  Normocephalic, without obvious abnormality, atraumatic  Eyes:  Conjunctiva clear, EOM's intact  Nose: Nares normal, normal mucosa, no visible anterior polyps, and septum midline  Throat: Lips, tongue normal; teeth and gums normal, normal posterior oropharynx  Neck: Supple, symmetrical  Lungs:   clear to auscultation bilaterally, Respirations unlabored, no coughing  Heart:  regular rate and rhythm and no murmur, Appears well perfused  Extremities: No edema  Skin: Skin color, texture, turgor normal, no rashes or lesions on visualized portions of skin  Neurologic: No gross deficits   Previous notes and tests were reviewed. The plan was reviewed with the patient/family, and all questions/concerned were addressed.  It was my pleasure to see Carzell today and participate in his care. Please feel free to contact me with any questions or concerns.  Sincerely,  [name], MD  Allergy & Immunology  [facility] Office: [phone]

## 2023-03-17 ENCOUNTER — Ambulatory Visit: Payer: Medicare HMO | Admitting: Family Medicine

## 2023-03-17 NOTE — Progress Notes (Signed)
Subjective:  Patient ID: Joe Arroyo, male    DOB: 03-Aug-1955  Age: 67 y.o. MRN: [id number]  Chief Complaint  Patient presents with   Medical Management of Chronic Issues    HPI  Patient states his shob is worsening. SpO2 is 99% States this has been ongoing for 3 years will have BL arm pain and chest pain due to the shob. Thorough pulmonary work up. Has been on numerous inhalers, none of which have helped. LAD stent did not resolve symptoms. ECHo and holtor monitors do not show a source of dypsnea.  Patient has seen ENT as well as the [facility] in [place].  At the time of his appointment he was waiting for neurology referral from the allergist.  The concern was whether or not this was a neurological issue resulting in shortness of breath.    CORONARY ARTERY DISEASE: on plavix, aspirin, crestor, zetia, fish oil 1 tablet twice daily.    BPH: on proscar.    HYPERTENSION:  on clonidine 0.3 mg once daily and Norvasc 2.5 mg daily.     GERD: On protonix 40 mg daily.    Hyperlipidemia: Fish oil 1 tablet twice daily. Zetia 10 mg daily.    OSA: Mild. Patient has refused cpap.           12/23/2022   11:37 AM 11/11/2022    2:44 PM 09/16/2022    9:23 AM 09/12/2021    8:45 AM 08/01/2021    2:16 PM  Depression screen PHQ 2/9  Decreased Interest 1 0 0 0 0  Down, Depressed, Hopeless 0 0 0 0 0  PHQ - 2 Score 1 0 0 0 0  Altered sleeping 0 0 0    Tired, decreased energy 1 0 1    Change in appetite 0 0 0    Feeling bad or failure about yourself  0 0 0    Trouble concentrating 0 0 0    Moving slowly or fidgety/restless 0 0 0    Suicidal thoughts 0 0 0    PHQ-9 Score 2 0 1    Difficult doing work/chores Somewhat difficult Not difficult at all Not difficult at all          11/11/2022    2:45 PM  Fall Risk   Falls in the past year? 0  Number falls in past yr: 0  Injury with Fall? 0  Risk for fall due to : No Fall Risks  Follow up Falls prevention  discussed    Patient Care Team: Blane Ohara, MD as PCP - General (Internal Medicine) Baldo Daub, MD as PCP - Cardiology (Cardiology) Blane Ohara, MD as Referring Physician (Internal Medicine) Baldo Daub, MD as Consulting Physician (Cardiology) Martina Sinner, MD as Consulting Physician (Pulmonary Disease) Debroah Baller, MD as Consulting Physician (Urology)   Review of Systems  Constitutional:  Negative for chills, diaphoresis, fatigue and fever.  HENT:  Negative for congestion, ear pain and sore throat.   Respiratory:  Positive for shortness of breath. Negative for cough.   Cardiovascular:  Negative for chest pain and leg swelling.  Gastrointestinal:  Negative for abdominal pain, constipation, diarrhea, nausea and vomiting.  Genitourinary:  Negative for dysuria and urgency.  Musculoskeletal:  Negative for arthralgias and myalgias.  Neurological:  Negative for dizziness and headaches.  Psychiatric/Behavioral:  Negative for dysphoric mood.   ;  Current Outpatient Medications on File Prior to Visit  Medication Sig Dispense Refill  amLODipine (NORVASC) 2.5 MG tablet Take 1 tablet (2.5 mg total) by mouth daily. for blood pressure 90 tablet 1   azelastine (ASTELIN) 0.1 % nasal spray 1-2 sprays in each nostril twice a day as needed 30 mL 5   BREO ELLIPTA 100-25 MCG/ACT AEPB Inhale 1 puff into the lungs daily.     cloNIDine (CATAPRES) 0.3 MG tablet Take 1 tablet (0.3 mg total) by mouth daily. 90 tablet 1   clopidogrel (PLAVIX) 75 MG tablet TAKE ONE TABLET BY MOUTH DAILY 90 tablet 1   esomeprazole (NEXIUM) 10 MG packet Take 10 mg by mouth daily before breakfast.     ezetimibe (ZETIA) 10 MG tablet Take 1 tablet (10 mg total) by mouth daily. 90 tablet 1   finasteride (PROSCAR) 5 MG tablet Take 5 mg by mouth daily.     fluticasone (FLONASE) 50 MCG/ACT nasal spray 1-2 sprays in each nostril daily 16 g 5   furosemide (LASIX) 40 MG tablet Take 1 tablet (40 mg total) by mouth 2  (two) times daily. 180 tablet 1   ipratropium (ATROVENT) 0.06 % nasal spray 1-2 sprays in each nostril up to 3 times a day as needed for runny nose/post nasal drip/drainage 15 mL 5   levocetirizine (XYZAL) 5 MG tablet Take 1 tablet (5 mg total) by mouth every evening. 30 tablet 5   magnesium oxide (MAG-OX) 400 (240 Mg) MG tablet Take 400 mg by mouth daily.     montelukast (SINGULAIR) 10 MG tablet Take 1 tablet (10 mg total) by mouth at bedtime. 30 tablet 5   Multiple Vitamins-Minerals (CENTRUM SILVER ULTRA MENS PO) Take 1 tablet by mouth daily.     Omega-3 Fatty Acids (FISH OIL PO) Take 1 tablet by mouth 2 (two) times daily.     rosuvastatin (CRESTOR) 40 MG tablet Take 1 tablet (40 mg total) by mouth daily. 90 tablet 0   zinc gluconate 50 MG tablet Take 50 mg by mouth daily.     No current facility-administered medications on file prior to visit.   Past Medical History:  Diagnosis Date   Abnormal cardiac CT angiography    Atypical chest pain 01/07/2021   Chronic idiopathic constipation 01/07/2021   Coronary artery disease    Dyspnea on exertion 09/05/2019   Essential hypertension, benign 05/14/2018   GERD (gastroesophageal reflux disease) 09/28/2020   Hiatal hernia 09/28/2020   History of hiatal hernia    History of kidney stones    Hyperlipidemia 05/14/2018   Hypertensive heart disease 05/14/2018   09/17/2021: SUMMARY  Severe single-vessel disease as indicated by Coronary CTA with RPL 2 having tandem 90% and 80% stenoses (CT FFR positive)-this vessel is at least the same size as the LAD  Successful DES PCI of RPL 2 covering both lesions with a Synergy DES 2.25 mm x 20 mm postdilated in tapered fashion from 2.5 to 2.3 mm.)  Otherwise minimal CAD with relatively small caliber left coronary syst   Non-seasonal allergic rhinitis due to pollen 08/16/2019   OSA (obstructive sleep apnea) 09/28/2020   Other chronic sinusitis 05/02/2022   Other fatigue 03/17/2022   Other seasonal allergic  rhinitis 05/02/2022   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   Shortness of breath 09/05/2019   Sinus bradycardia 05/14/2018   Syncope 09/05/2019   Thoracic ascending aortic aneurysm (HCC)    ascending TAA 4.6 x 4.4 cm 10/03/20 CTA chest   Past Surgical History:  Procedure Laterality Date   COLONOSCOPY     CORONARY STENT INTERVENTION N/A  10/07/2021   Procedure: CORONARY STENT INTERVENTION;  Surgeon: Marykay Lex, MD;  Location: [facility] INVASIVE CV LAB;  Service: Cardiovascular;  Laterality: N/A;   ESOPHAGOGASTRODUODENOSCOPY N/A 12/05/2020   Procedure: ESOPHAGOGASTRODUODENOSCOPY (EGD);  Surgeon: Corliss Skains, MD;  Location: [facility] OR;  Service: Thoracic;  Laterality: N/A;   LAMINECTOMY     MOLE REMOVAL     PROSTATE BIOPSY     RIGHT/LEFT HEART CATH AND CORONARY ANGIOGRAPHY N/A 10/07/2021   Procedure: RIGHT/LEFT HEART CATH AND CORONARY ANGIOGRAPHY;  Surgeon: Marykay Lex, MD;  Location: [facility] INVASIVE CV LAB;  Service: Cardiovascular;  Laterality: N/A;   TONSILLECTOMY AND ADENOIDECTOMY     UPPER GASTROINTESTINAL ENDOSCOPY     XI ROBOTIC ASSISTED PARAESOPHAGEAL HERNIA REPAIR N/A 12/05/2020   Procedure: XI ROBOTIC ASSISTED PARAESOPHAGEAL HERNIA REPAIR AND FUNDOPLICATION;  Surgeon: Corliss Skains, MD;  Location: [facility] OR;  Service: Thoracic;  Laterality: N/A;    Family History  Problem Relation Age of Onset   Hyperlipidemia Mother    Aortic stenosis Mother    Valvular heart disease Mother    Colon cancer Mother    Diabetes Mellitus II Mother    Cancer - Colon Mother    Cancer Mother    Stroke Mother    Cerebrovascular Accident Mother    Diabetes Mother    Hypertension Mother    Hyperlipidemia Father    Diabetes Mellitus II Father    Diabetes Father    Alzheimer's disease Father    Esophageal cancer Paternal Uncle    CAD Maternal Grandmother    Congestive Heart Failure Maternal Grandmother    Heart attack Maternal Grandmother    Heart failure Maternal Grandmother    Coronary  artery disease Maternal Grandmother    Hyperlipidemia Maternal Grandmother    Social History   Socioeconomic History   Marital status: Single    Spouse name: Not on file   Number of children: Not on file   Years of education: Not on file   Highest education level: Not on file  Occupational History   Not on file  Tobacco Use   Smoking status: Never   Smokeless tobacco: Never  Vaping Use   Vaping status: Never Used  Substance and Sexual Activity   Alcohol use: Yes    Comment: occasional   Drug use: Never   Sexual activity: Not on file  Other Topics Concern   Not on file  Social History Narrative   Not on file   Social Drivers of Health   Financial Resource Strain: Low Risk  (11/11/2022)   Overall Financial Resource Strain (CARDIA)    Difficulty of Paying Living Expenses: Not hard at all  Food Insecurity: No Food Insecurity (11/11/2022)   Hunger Vital Sign    Worried About Running Out of Food in the Last Year: Never true    Ran Out of Food in the Last Year: Never true  Transportation Needs: No Transportation Needs (11/11/2022)   PRAPARE - Administrator, Civil Service (Medical): No    Lack of Transportation (Non-Medical): No  Physical Activity: Sufficiently Active (11/11/2022)   Exercise Vital Sign    Days of Exercise per Week: 5 days    Minutes of Exercise per Session: 30 min  Stress: No Stress Concern Present (11/11/2022)   [employer] of Occupational Health - Occupational Stress Questionnaire    Feeling of Stress : Not at all  Social Connections: Socially Isolated (11/11/2022)   Social Connection and Isolation Panel [NHANES]  Frequency of Communication with Friends and Family: More than three times a week    Frequency of Social Gatherings with Friends and Family: More than three times a week    Attends Religious Services: Never    Database administrator or Organizations: No    Attends Engineer, structural: Never    Marital Status: Never  married    Objective:  BP 136/72   Pulse 78   Temp 97.8 F (36.6 C)   Ht 6' (1.829 m)   Wt 198 lb (89.8 kg)   SpO2 98%   BMI 26.85 kg/m      03/18/2023    1:22 PM 03/06/2023   10:37 AM 02/02/2023    1:37 PM  BP/Weight  Systolic BP 136 126 134  Diastolic BP 72 84 76  Wt. (Lbs) 198  200.6  BMI 26.85 kg/m2  27.21 kg/m2    Physical Exam Vitals reviewed.  Constitutional:      Comments: Becomes dyspneic with speaking.  Neck:     Vascular: No carotid bruit.  Cardiovascular:     Rate and Rhythm: Normal rate and regular rhythm.     Heart sounds: Normal heart sounds.  Pulmonary:     Effort: Pulmonary effort is normal.     Breath sounds: Normal breath sounds. No wheezing, rhonchi or rales.     Comments: Becomes dyspneic with speaking and any sort of walking. Neurological:     Mental Status: He is alert and oriented to person, place, and time.     Motor: No weakness.     Coordination: Coordination normal.     Gait: Gait normal.  Psychiatric:        Mood and Affect: Mood normal.        Behavior: Behavior normal.     Diabetic Foot Exam - Simple   No data filed      Lab Results  Component Value Date   WBC 9.2 12/23/2022   HGB 13.9 12/23/2022   HCT 43.2 12/23/2022   PLT 295 12/23/2022   GLUCOSE 77 12/23/2022   CHOL 111 12/23/2022   TRIG 172 (H) 12/23/2022   HDL 40 12/23/2022   LDLCALC 42 12/23/2022   ALT 18 12/23/2022   AST 29 12/23/2022   NA 142 12/23/2022   K 3.8 12/23/2022   CL 100 12/23/2022   CREATININE 1.29 (H) 12/23/2022   BUN 11 12/23/2022   CO2 23 12/23/2022   TSH 1.840 12/23/2022   INR 1.0 12/03/2020      Assessment & Plan:    Dyspnea on exertion Assessment & Plan: This has been progressively worsening over the last 3 years.  Patient becomes dyspneic with speaking.  Patient has had a thorough cardiac, pulmonary, ENT, and allergy workup which has been negative. I agree with the referral to neurology clinic to see if there is some sort of  neuromuscular disorder that is progressing.  Allergist put in a referral for [facility] neurology clinic.   Hypertensive heart disease without heart failure Assessment & Plan: Well controlled.  No changes to medicines. Continue on clonidine 0.3 mg once daily and Norvasc 2.5 mg twice daily.       Gastroesophageal reflux disease without esophagitis Assessment & Plan: Continue protonix 40 mg daily.    Stage 3a chronic kidney disease (HCC) Assessment & Plan: Stable.    Mixed hyperlipidemia Assessment & Plan: Well controlled.  No changes to medicines. Fish oil 1 tablet twice daily. Zetia 10 mg daily.  Continue to  work on eating a healthy diet and exercise.         No orders of the defined types were placed in this encounter.   No orders of the defined types were placed in this encounter.    Follow-up: Return in about 2 months (around 05/19/2023).   I,Marla I Leal-Borjas,acting as a scribe for Blane Ohara, MD.,have documented all relevant documentation on the behalf of Blane Ohara, MD,as directed by  Blane Ohara, MD while in the presence of Blane Ohara, MD.   An After Visit Summary was printed and given to the patient.  I attest that I have reviewed this visit and agree with the plan scribed by my staff.   Blane Ohara, MD [facility] [phone]

## 2023-03-18 ENCOUNTER — Encounter: Payer: Self-pay | Admitting: Family Medicine

## 2023-03-18 ENCOUNTER — Ambulatory Visit (INDEPENDENT_AMBULATORY_CARE_PROVIDER_SITE_OTHER): Payer: Medicare HMO | Admitting: Family Medicine

## 2023-03-18 VITALS — BP 136/72 | HR 78 | Temp 97.8°F | Ht 72.0 in | Wt 198.0 lb

## 2023-03-18 DIAGNOSIS — E782 Mixed hyperlipidemia: Secondary | ICD-10-CM | POA: Diagnosis not present

## 2023-03-18 DIAGNOSIS — N1831 Chronic kidney disease, stage 3a: Secondary | ICD-10-CM | POA: Diagnosis not present

## 2023-03-18 DIAGNOSIS — R0609 Other forms of dyspnea: Secondary | ICD-10-CM | POA: Diagnosis not present

## 2023-03-18 DIAGNOSIS — K219 Gastro-esophageal reflux disease without esophagitis: Secondary | ICD-10-CM | POA: Diagnosis not present

## 2023-03-18 DIAGNOSIS — I119 Hypertensive heart disease without heart failure: Secondary | ICD-10-CM | POA: Diagnosis not present

## 2023-03-18 NOTE — Patient Instructions (Signed)
I will work on neurology referral also.

## 2023-04-02 ENCOUNTER — Telehealth: Payer: Self-pay

## 2023-04-02 NOTE — Telephone Encounter (Signed)
-----   Message from [name] sent at 03/20/2023  3:49 PM EST ----- Can we refere this patient to [facility] neurology in [facility]?  Thanks!

## 2023-04-02 NOTE — Telephone Encounter (Signed)
Patients referral has been placed to the following office:   [facility] Phone: [phone] Fax: [phone] [address] [place], [place] [address]  Patient is aware that the referral has been placed.

## 2023-04-05 DIAGNOSIS — I10 Essential (primary) hypertension: Secondary | ICD-10-CM | POA: Insufficient documentation

## 2023-04-06 NOTE — Assessment & Plan Note (Signed)
Stable

## 2023-04-06 NOTE — Assessment & Plan Note (Signed)
Well controlled.  No changes to medicines. Continue on clonidine 0.3 mg once daily and Norvasc 2.5 mg twice daily.

## 2023-04-06 NOTE — Assessment & Plan Note (Signed)
This has been progressively worsening over the last 3 years.  Patient becomes dyspneic with speaking.  Patient has had a thorough cardiac, pulmonary, ENT, and allergy workup which has been negative. I agree with the referral to neurology clinic to see if there is some sort of neuromuscular disorder that is progressing.  Allergist put in a referral for [facility] neurology clinic.

## 2023-04-06 NOTE — Assessment & Plan Note (Addendum)
Well controlled.  No changes to medicines. Fish oil 1 tablet twice daily. Zetia 10 mg daily.  Continue to work on eating a healthy diet and exercise.

## 2023-04-06 NOTE — Assessment & Plan Note (Signed)
Continue protonix 40mg daily.  

## 2023-04-13 ENCOUNTER — Other Ambulatory Visit: Payer: Self-pay | Admitting: *Deleted

## 2023-04-13 MED ORDER — LEVOCETIRIZINE DIHYDROCHLORIDE 5 MG PO TABS: 5.0000 mg | ORAL_TABLET | Freq: Every evening | ORAL | 5 refills | Status: DC

## 2023-04-24 DIAGNOSIS — J45909 Unspecified asthma, uncomplicated: Secondary | ICD-10-CM | POA: Diagnosis not present

## 2023-04-24 DIAGNOSIS — J984 Other disorders of lung: Secondary | ICD-10-CM | POA: Diagnosis not present

## 2023-04-24 DIAGNOSIS — J328 Other chronic sinusitis: Secondary | ICD-10-CM | POA: Diagnosis not present

## 2023-04-27 ENCOUNTER — Other Ambulatory Visit: Payer: Self-pay

## 2023-04-27 MED ORDER — MONTELUKAST SODIUM 10 MG PO TABS: 10.0000 mg | ORAL_TABLET | Freq: Every day | ORAL | 3 refills | Status: DC

## 2023-05-04 DIAGNOSIS — K219 Gastro-esophageal reflux disease without esophagitis: Secondary | ICD-10-CM | POA: Diagnosis not present

## 2023-05-04 DIAGNOSIS — K649 Unspecified hemorrhoids: Secondary | ICD-10-CM | POA: Diagnosis not present

## 2023-05-04 DIAGNOSIS — K5900 Constipation, unspecified: Secondary | ICD-10-CM | POA: Diagnosis not present

## 2023-05-04 DIAGNOSIS — D3A026 Benign carcinoid tumor of the rectum: Secondary | ICD-10-CM | POA: Diagnosis not present

## 2023-05-16 DIAGNOSIS — G629 Polyneuropathy, unspecified: Secondary | ICD-10-CM

## 2023-05-16 HISTORY — DX: Polyneuropathy, unspecified: G62.9

## 2023-05-25 ENCOUNTER — Other Ambulatory Visit: Payer: Self-pay

## 2023-05-25 DIAGNOSIS — E782 Mixed hyperlipidemia: Secondary | ICD-10-CM

## 2023-05-25 MED ORDER — ROSUVASTATIN CALCIUM 40 MG PO TABS
40.0000 mg | ORAL_TABLET | Freq: Every day | ORAL | 0 refills | Status: DC
Start: 2023-05-25 — End: 2023-08-14

## 2023-05-25 NOTE — Progress Notes (Signed)
 Subjective:  Patient ID: Joe Arroyo, male    DOB: 1955/06/25  Age: 68 y.o. MRN: [id number]  Chief Complaint  Patient presents with   Medical Management of Chronic Issues    HPI The patient, with a history of heart disease, high blood pressure, and prostate issues, presents with increasing chest pain. The pain is described as a feeling of pressure, as if "someone is sitting on my chest." The discomfort is primarily located in the center of the chest, over the sternum, and sometimes radiates to the back. The pain is constant and is not relieved by nitroglycerin, but a heating pad seems to help. The patient also reports shortness of breath, which is exacerbated by weather changes.   The patient has seen multiple specialists, including neurology, cardiology, three pulmonologists and two otolaryngologists, without a definitive diagnosis. 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 The patient has been trialed on various inhalers and a steroid injection, but these have not provided significant relief. The patient also reports a severe reaction to a steroid injection, which caused a feeling of "exploding" in the face and chest. He has been referred to a neurologist specializing in musculoskeletal causes of dyspnea. His appointment is not until the summer 2025.   The patient is scheduled to see a neurologist in July, as there is a suspicion that the symptoms may be related to a neurological issue. The patient also reports sinus headaches and uses nasal sprays as needed.  CORONARY ARTERY DISEASE: on plavix, aspirin, crestor, zetia, fish oil 1 tablet twice daily.   BPH: on proscar.   HYPERTENSION:  on clonidine 0.3 mg once daily and Norvasc 2.5 mg daily.    GERD: On nexium 40 mg daily.   Hyperlipidemia: Fish oil 1 tablet twice daily. Zetia 10 mg daily.   OSA: Mild. Patient has refused cpap.   Colonoscopy scheduled 2/19     05/26/2023     9:59 AM 12/23/2022   11:37 AM 11/11/2022    2:44 PM 09/16/2022    9:23 AM 09/12/2021    8:45 AM  Depression screen PHQ 2/9  Decreased Interest 0 1 0 0 0  Down, Depressed, Hopeless 0 0 0 0 0  PHQ - 2 Score 0 1 0 0 0  Altered sleeping  0 0 0   Tired, decreased energy  1 0 1   Change in appetite  0 0 0   Feeling bad or failure about yourself   0 0 0   Trouble concentrating  0 0 0   Moving slowly or fidgety/restless  0 0 0   Suicidal thoughts  0 0 0   PHQ-9 Score  2 0 1   Difficult doing work/chores  Somewhat difficult Not difficult at all Not difficult at all         11/11/2022    2:45 PM  Fall Risk   Falls in the past year? 0  Number falls in past yr: 0  Injury with Fall? 0  Risk for fall due to : No Fall Risks  Follow up Falls prevention discussed    Patient Care Team: Blane Ohara, MD as PCP - General (Internal Medicine) Baldo Daub, MD as PCP - Cardiology (Cardiology) Blane Ohara, MD as Referring Physician (Internal Medicine) Baldo Daub, MD as Consulting Physician (Cardiology) Martina Sinner, MD as Consulting Physician (Pulmonary Disease) Debroah Baller, MD as Consulting Physician (Urology)   Review of Systems  Constitutional:  Negative for  chills, diaphoresis, fatigue and fever.  HENT:  Negative for congestion, ear pain and sore throat.   Respiratory:  Negative for cough and shortness of breath.   Cardiovascular:  Negative for chest pain and leg swelling.  Gastrointestinal:  Negative for abdominal pain, constipation, diarrhea, nausea and vomiting.  Genitourinary:  Negative for dysuria and urgency.  Musculoskeletal:  Negative for arthralgias and myalgias.  Neurological:  Negative for dizziness and headaches.  Psychiatric/Behavioral:  Negative for dysphoric mood.     Current Outpatient Medications on File Prior to Visit  Medication Sig Dispense Refill   umeclidinium bromide (INCRUSE ELLIPTA) 62.5 MCG/ACT AEPB Inhale 1 puff into the lungs daily.      amLODipine (NORVASC) 2.5 MG tablet Take 1 tablet (2.5 mg total) by mouth daily. for blood pressure 90 tablet 1   azelastine (ASTELIN) 0.1 % nasal spray 1-2 sprays in each nostril twice a day as needed 30 mL 5   cloNIDine (CATAPRES) 0.3 MG tablet Take 1 tablet (0.3 mg total) by mouth daily. 90 tablet 1   clopidogrel (PLAVIX) 75 MG tablet TAKE ONE TABLET BY MOUTH DAILY 90 tablet 1   esomeprazole (NEXIUM) 10 MG packet Take 10 mg by mouth daily before breakfast.     ezetimibe (ZETIA) 10 MG tablet Take 1 tablet (10 mg total) by mouth daily. 90 tablet 1   finasteride (PROSCAR) 5 MG tablet Take 5 mg by mouth daily.     fluticasone (FLONASE) 50 MCG/ACT nasal spray 1-2 sprays in each nostril daily 16 g 5   furosemide (LASIX) 40 MG tablet Take 1 tablet (40 mg total) by mouth 2 (two) times daily. 180 tablet 1   ipratropium (ATROVENT) 0.06 % nasal spray 1-2 sprays in each nostril up to 3 times a day as needed for runny nose/post nasal drip/drainage 15 mL 5   levocetirizine (XYZAL) 5 MG tablet Take 1 tablet (5 mg total) by mouth every evening. 30 tablet 5   magnesium oxide (MAG-OX) 400 (240 Mg) MG tablet Take 400 mg by mouth daily.     montelukast (SINGULAIR) 10 MG tablet Take 1 tablet (10 mg total) by mouth at bedtime. 30 tablet 3   Multiple Vitamins-Minerals (CENTRUM SILVER ULTRA MENS PO) Take 1 tablet by mouth daily.     Omega-3 Fatty Acids (FISH OIL PO) Take 1 tablet by mouth 2 (two) times daily.     rosuvastatin (CRESTOR) 40 MG tablet Take 1 tablet (40 mg total) by mouth daily. 90 tablet 0   zinc gluconate 50 MG tablet Take 50 mg by mouth daily.     No current facility-administered medications on file prior to visit.   Past Medical History:  Diagnosis Date   Abnormal cardiac CT angiography    Atypical chest pain 01/07/2021   Chronic idiopathic constipation 01/07/2021   Coronary artery disease    Dyspnea on exertion 09/05/2019   Essential hypertension, benign 05/14/2018   GERD (gastroesophageal  reflux disease) 09/28/2020   Hiatal hernia 09/28/2020   History of hiatal hernia    History of kidney stones    Hyperlipidemia 05/14/2018   Hypertensive heart disease 05/14/2018   09/17/2021: SUMMARY  Severe single-vessel disease as indicated by Coronary CTA with RPL 2 having tandem 90% and 80% stenoses (CT FFR positive)-this vessel is at least the same size as the LAD  Successful DES PCI of RPL 2 covering both lesions with a Synergy DES 2.25 mm x 20 mm postdilated in tapered fashion from 2.5 to 2.3 mm.)  Otherwise  minimal CAD with relatively small caliber left coronary syst   Non-seasonal allergic rhinitis due to pollen 08/16/2019   OSA (obstructive sleep apnea) 09/28/2020   Other chronic sinusitis 05/02/2022   Other fatigue 03/17/2022   Other seasonal allergic rhinitis 05/02/2022   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   Shortness of breath 09/05/2019   Sinus bradycardia 05/14/2018   Syncope 09/05/2019   Thoracic ascending aortic aneurysm (HCC)    ascending TAA 4.6 x 4.4 cm 10/03/20 CTA chest   Past Surgical History:  Procedure Laterality Date   COLONOSCOPY     CORONARY STENT INTERVENTION N/A 10/07/2021   Procedure: CORONARY STENT INTERVENTION;  Surgeon: Marykay Lex, MD;  Location: [facility] INVASIVE CV LAB;  Service: Cardiovascular;  Laterality: N/A;   ESOPHAGOGASTRODUODENOSCOPY N/A 12/05/2020   Procedure: ESOPHAGOGASTRODUODENOSCOPY (EGD);  Surgeon: Corliss Skains, MD;  Location: [facility] OR;  Service: Thoracic;  Laterality: N/A;   LAMINECTOMY     MOLE REMOVAL     PROSTATE BIOPSY     RIGHT/LEFT HEART CATH AND CORONARY ANGIOGRAPHY N/A 10/07/2021   Procedure: RIGHT/LEFT HEART CATH AND CORONARY ANGIOGRAPHY;  Surgeon: Marykay Lex, MD;  Location: [facility] INVASIVE CV LAB;  Service: Cardiovascular;  Laterality: N/A;   TONSILLECTOMY AND ADENOIDECTOMY     UPPER GASTROINTESTINAL ENDOSCOPY     XI ROBOTIC ASSISTED PARAESOPHAGEAL HERNIA REPAIR N/A 12/05/2020   Procedure: XI ROBOTIC ASSISTED PARAESOPHAGEAL  HERNIA REPAIR AND FUNDOPLICATION;  Surgeon: Corliss Skains, MD;  Location: [facility] OR;  Service: Thoracic;  Laterality: N/A;    Family History  Problem Relation Age of Onset   Hyperlipidemia Mother    Aortic stenosis Mother    Valvular heart disease Mother    Colon cancer Mother    Diabetes Mellitus II Mother    Cancer - Colon Mother    Cancer Mother    Stroke Mother    Cerebrovascular Accident Mother    Diabetes Mother    Hypertension Mother    Hyperlipidemia Father    Diabetes Mellitus II Father    Diabetes Father    Alzheimer's disease Father    Esophageal cancer Paternal Uncle    CAD Maternal Grandmother    Congestive Heart Failure Maternal Grandmother    Heart attack Maternal Grandmother    Heart failure Maternal Grandmother    Coronary artery disease Maternal Grandmother    Hyperlipidemia Maternal Grandmother    Social History   Socioeconomic History   Marital status: Single    Spouse name: Not on file   Number of children: Not on file   Years of education: Not on file   Highest education level: Not on file  Occupational History   Not on file  Tobacco Use   Smoking status: Never   Smokeless tobacco: Never  Vaping Use   Vaping status: Never Used  Substance and Sexual Activity   Alcohol use: Yes    Comment: occasional   Drug use: Never   Sexual activity: Not on file  Other Topics Concern   Not on file  Social History Narrative   Not on file   Social Drivers of Health   Financial Resource Strain: Low Risk  (11/11/2022)   Overall Financial Resource Strain (CARDIA)    Difficulty of Paying Living Expenses: Not hard at all  Food Insecurity: No Food Insecurity (11/11/2022)   Hunger Vital Sign    Worried About Running Out of Food in the Last Year: Never true    Ran Out of Food in the Last Year: Never true  Transportation Needs: No Transportation Needs (11/11/2022)   PRAPARE - Administrator, Civil Service (Medical): No    Lack of Transportation  (Non-Medical): No  Physical Activity: Sufficiently Active (11/11/2022)   Exercise Vital Sign    Days of Exercise per Week: 5 days    Minutes of Exercise per Session: 30 min  Stress: No Stress Concern Present (11/11/2022)   [employer] of Occupational Health - Occupational Stress Questionnaire    Feeling of Stress : Not at all  Social Connections: Socially Isolated (11/11/2022)   Social Connection and Isolation Panel [NHANES]    Frequency of Communication with Friends and Family: More than three times a week    Frequency of Social Gatherings with Friends and Family: More than three times a week    Attends Religious Services: Never    Database administrator or Organizations: No    Attends Engineer, structural: Never    Marital Status: Never married    Objective:  BP 128/80   Pulse 88   Temp 97.6 F (36.4 C)   Ht 6' 1.8" (1.875 m)   Wt 197 lb (89.4 kg)   SpO2 98%   BMI 25.43 kg/m      05/26/2023    9:55 AM 03/18/2023    1:22 PM 03/06/2023   10:37 AM  BP/Weight  Systolic BP 128 136 126  Diastolic BP 80 72 84  Wt. (Lbs) 197 198   BMI 25.43 kg/m2 26.85 kg/m2     Physical Exam Vitals reviewed.  Constitutional:      Appearance: Normal appearance.  Neck:     Vascular: No carotid bruit.  Cardiovascular:     Rate and Rhythm: Normal rate and regular rhythm.     Heart sounds: Normal heart sounds.  Pulmonary:     Effort: Pulmonary effort is normal.     Breath sounds: Normal breath sounds. No wheezing, rhonchi or rales.  Abdominal:     General: Bowel sounds are normal.     Palpations: Abdomen is soft.     Tenderness: There is no abdominal tenderness.  Neurological:     Mental Status: He is alert and oriented to person, place, and time.  Psychiatric:        Mood and Affect: Mood normal.        Behavior: Behavior normal.     Diabetic Foot Exam - Simple   No data filed      Lab Results  Component Value Date   WBC 7.6 05/26/2023   HGB 14.5 05/26/2023    HCT 45.3 05/26/2023   PLT 265 05/26/2023   GLUCOSE 87 05/26/2023   CHOL 128 05/26/2023   TRIG 214 (H) 05/26/2023   HDL 44 05/26/2023   LDLCALC 50 05/26/2023   ALT 23 05/26/2023   AST 38 05/26/2023   NA 142 05/26/2023   K 3.1 (L) 05/26/2023   CL 98 05/26/2023   CREATININE 1.42 (H) 05/26/2023   BUN 14 05/26/2023   CO2 25 05/26/2023   TSH 2.710 05/26/2023   INR 1.0 12/03/2020      Assessment & Plan:    Hypertensive heart disease without heart failure Assessment & Plan: Well controlled.  No changes to medicines. Continue on clonidine 0.3 mg once daily and Norvasc 2.5 mg twice daily.      Orders: -     CBC with Differential/Platelet -     Comprehensive metabolic panel  Gastroesophageal reflux disease without esophagitis Assessment & Plan: Continue protonix  40 mg daily.    Stage 3a chronic kidney disease (HCC) Assessment & Plan: Check labs    Mixed hyperlipidemia Assessment & Plan: Well controlled.  No changes to medicines. Fish oil 1 tablet twice daily. Zetia 10 mg daily.  Continue to work on eating a healthy diet and exercise.     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 No improvement with Incruse inhaler and steroid injection. Continued mucus production. -Continue Incruse inhaler and use of flutter device. -Follow-up appointment with pulmonology on 06/11/2023.   Other chest pain Assessment & Plan: Increased frequency of chest pain, predominantly in the center of the chest and lower sternum, radiating to the back. Pain described as a feeling of pressure, similar to muscle soreness. Relief with heating pad. Cardiac etiology has been ruled out by cardiology. -Continue current medications and management. -Consider further evaluation by neurology (appointment scheduled for July).   Dyspnea  on exertion Assessment & Plan: Unclear etiology. Keep neurology appointment.       No orders of the defined types were placed in this encounter.   Orders Placed This Encounter  Procedures   CBC with Differential/Platelet   Comprehensive metabolic panel   Lipid panel   TSH     Follow-up: Return in about 3 months (around 08/27/2023).   I,Marla I Leal-Borjas,acting as a scribe for Blane Ohara, MD.,have documented all relevant documentation on the behalf of Blane Ohara, MD,as directed by  Blane Ohara, MD while in the presence of Blane Ohara, MD.   An After Visit Summary was printed and given to the patient.  I attest that I have reviewed this visit and agree with the plan scribed by my staff.   Blane Ohara, MD [facility] [phone]

## 2023-05-26 ENCOUNTER — Ambulatory Visit: Payer: Medicare HMO | Admitting: Family Medicine

## 2023-05-26 ENCOUNTER — Encounter: Payer: Self-pay | Admitting: Family Medicine

## 2023-05-26 VITALS — BP 128/80 | HR 88 | Temp 97.6°F | Ht 73.8 in | Wt 197.0 lb

## 2023-05-26 DIAGNOSIS — R0609 Other forms of dyspnea: Secondary | ICD-10-CM

## 2023-05-26 DIAGNOSIS — J45909 Unspecified asthma, uncomplicated: Secondary | ICD-10-CM

## 2023-05-26 DIAGNOSIS — R0789 Other chest pain: Secondary | ICD-10-CM | POA: Diagnosis not present

## 2023-05-26 DIAGNOSIS — E782 Mixed hyperlipidemia: Secondary | ICD-10-CM | POA: Diagnosis not present

## 2023-05-26 DIAGNOSIS — I119 Hypertensive heart disease without heart failure: Secondary | ICD-10-CM

## 2023-05-26 DIAGNOSIS — N1831 Chronic kidney disease, stage 3a: Secondary | ICD-10-CM

## 2023-05-26 DIAGNOSIS — K219 Gastro-esophageal reflux disease without esophagitis: Secondary | ICD-10-CM

## 2023-05-27 ENCOUNTER — Other Ambulatory Visit: Payer: Self-pay

## 2023-05-27 DIAGNOSIS — R42 Dizziness and giddiness: Secondary | ICD-10-CM | POA: Diagnosis not present

## 2023-05-27 DIAGNOSIS — G609 Hereditary and idiopathic neuropathy, unspecified: Secondary | ICD-10-CM | POA: Diagnosis not present

## 2023-05-27 DIAGNOSIS — N289 Disorder of kidney and ureter, unspecified: Secondary | ICD-10-CM

## 2023-05-27 LAB — COMPREHENSIVE METABOLIC PANEL
ALT: 23 [IU]/L (ref 0–44)
AST: 38 [IU]/L (ref 0–40)
Albumin: 4.6 g/dL (ref 3.9–4.9)
Alkaline Phosphatase: 122 [IU]/L — ABNORMAL HIGH (ref 44–121)
BUN/Creatinine Ratio: 10 (ref 10–24)
BUN: 14 mg/dL (ref 8–27)
Bilirubin Total: 0.4 mg/dL (ref 0.0–1.2)
CO2: 25 mmol/L (ref 20–29)
Calcium: 10 mg/dL (ref 8.6–10.2)
Chloride: 98 mmol/L (ref 96–106)
Creatinine, Ser: 1.42 mg/dL — ABNORMAL HIGH (ref 0.76–1.27)
Globulin, Total: 2.5 g/dL (ref 1.5–4.5)
Glucose: 87 mg/dL (ref 70–99)
Potassium: 3.1 mmol/L — ABNORMAL LOW (ref 3.5–5.2)
Sodium: 142 mmol/L (ref 134–144)
Total Protein: 7.1 g/dL (ref 6.0–8.5)
eGFR: 53 mL/min/{1.73_m2} — ABNORMAL LOW (ref >59)

## 2023-05-27 LAB — CBC WITH DIFFERENTIAL/PLATELET
Basophils Absolute: 0.1 10*3/uL (ref 0.0–0.2)
Basos: 1 %
EOS (ABSOLUTE): 0.1 10*3/uL (ref 0.0–0.4)
Eos: 1 %
Hematocrit: 45.3 % (ref 37.5–51.0)
Hemoglobin: 14.5 g/dL (ref 13.0–17.7)
Immature Grans (Abs): 0 10*3/uL (ref 0.0–0.1)
Immature Granulocytes: 0 %
Lymphocytes Absolute: 2.1 10*3/uL (ref 0.7–3.1)
Lymphs: 28 %
MCH: 27.1 pg (ref 26.6–33.0)
MCHC: 32 g/dL (ref 31.5–35.7)
MCV: 85 fL (ref 79–97)
Monocytes Absolute: 0.9 10*3/uL (ref 0.1–0.9)
Monocytes: 11 %
Neutrophils Absolute: 4.4 10*3/uL (ref 1.4–7.0)
Neutrophils: 59 %
Platelets: 265 10*3/uL (ref 150–450)
RBC: 5.36 x10E6/uL (ref 4.14–5.80)
RDW: 16.7 % — ABNORMAL HIGH (ref 11.6–15.4)
WBC: 7.6 10*3/uL (ref 3.4–10.8)

## 2023-05-27 LAB — LIPID PANEL
Chol/HDL Ratio: 2.9 {ratio} (ref 0.0–5.0)
Cholesterol, Total: 128 mg/dL (ref 100–199)
HDL: 44 mg/dL (ref >39)
LDL Chol Calc (NIH): 50 mg/dL (ref 0–99)
Triglycerides: 214 mg/dL — ABNORMAL HIGH (ref 0–149)
VLDL Cholesterol Cal: 34 mg/dL (ref 5–40)

## 2023-05-27 LAB — TSH: TSH: 2.71 u[IU]/mL (ref 0.450–4.500)

## 2023-05-27 MED ORDER — OMEGA-3-ACID ETHYL ESTERS 1 G PO CAPS
2.0000 g | ORAL_CAPSULE | Freq: Two times a day (BID) | ORAL | 2 refills | Status: DC
Start: 2023-05-27 — End: 2023-08-11

## 2023-05-27 NOTE — Progress Notes (Signed)
I sent the Rx to the pharmacy.

## 2023-05-31 DIAGNOSIS — J45909 Unspecified asthma, uncomplicated: Secondary | ICD-10-CM | POA: Insufficient documentation

## 2023-05-31 NOTE — Assessment & Plan Note (Signed)
 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 No improvement with Incruse inhaler and steroid injection. Continued mucus production. -Continue Incruse inhaler and use of flutter device. -Follow-up appointment with pulmonology on 06/11/2023.

## 2023-05-31 NOTE — Assessment & Plan Note (Signed)
 Check labs

## 2023-05-31 NOTE — Assessment & Plan Note (Signed)
 Increased frequency of chest pain, predominantly in the center of the chest and lower sternum, radiating to the back. Pain described as a feeling of pressure, similar to muscle soreness. Relief with heating pad. Cardiac etiology has been ruled out by cardiology. -Continue current medications and management. -Consider further evaluation by neurology (appointment scheduled for July).

## 2023-05-31 NOTE — Assessment & Plan Note (Signed)
Well controlled.  No changes to medicines. Continue on clonidine 0.3 mg once daily and Norvasc 2.5 mg twice daily.

## 2023-05-31 NOTE — Assessment & Plan Note (Signed)
 Unclear etiology. Keep neurology appointment.

## 2023-05-31 NOTE — Assessment & Plan Note (Signed)
 Continue protonix 40mg  daily.

## 2023-05-31 NOTE — Assessment & Plan Note (Signed)
 Well controlled.  No changes to medicines. Fish oil 1 tablet twice daily. Zetia 10 mg daily.  Continue to work on eating a healthy diet and exercise.

## 2023-06-05 DIAGNOSIS — G629 Polyneuropathy, unspecified: Secondary | ICD-10-CM | POA: Diagnosis not present

## 2023-06-10 ENCOUNTER — Ambulatory Visit: Payer: Medicare HMO

## 2023-06-11 DIAGNOSIS — Z87442 Personal history of urinary calculi: Secondary | ICD-10-CM | POA: Diagnosis not present

## 2023-06-11 DIAGNOSIS — R339 Retention of urine, unspecified: Secondary | ICD-10-CM | POA: Diagnosis not present

## 2023-06-11 DIAGNOSIS — C61 Malignant neoplasm of prostate: Secondary | ICD-10-CM | POA: Diagnosis not present

## 2023-06-12 DIAGNOSIS — R0609 Other forms of dyspnea: Secondary | ICD-10-CM | POA: Diagnosis not present

## 2023-06-12 DIAGNOSIS — J986 Disorders of diaphragm: Secondary | ICD-10-CM | POA: Diagnosis not present

## 2023-06-12 DIAGNOSIS — M6281 Muscle weakness (generalized): Secondary | ICD-10-CM | POA: Diagnosis not present

## 2023-06-13 DIAGNOSIS — R0609 Other forms of dyspnea: Secondary | ICD-10-CM | POA: Diagnosis not present

## 2023-06-15 DIAGNOSIS — M6281 Muscle weakness (generalized): Secondary | ICD-10-CM | POA: Insufficient documentation

## 2023-06-15 DIAGNOSIS — J986 Disorders of diaphragm: Secondary | ICD-10-CM | POA: Insufficient documentation

## 2023-06-15 HISTORY — DX: Muscle weakness (generalized): M62.81

## 2023-06-23 DIAGNOSIS — R42 Dizziness and giddiness: Secondary | ICD-10-CM

## 2023-06-23 HISTORY — DX: Dizziness and giddiness: R42

## 2023-06-25 DIAGNOSIS — R42 Dizziness and giddiness: Secondary | ICD-10-CM | POA: Diagnosis not present

## 2023-07-03 DIAGNOSIS — R42 Dizziness and giddiness: Secondary | ICD-10-CM | POA: Diagnosis not present

## 2023-07-05 ENCOUNTER — Other Ambulatory Visit: Payer: Self-pay | Admitting: Cardiology

## 2023-07-06 ENCOUNTER — Other Ambulatory Visit: Payer: Self-pay

## 2023-07-06 MED ORDER — EZETIMIBE 10 MG PO TABS: 10.0000 mg | ORAL_TABLET | Freq: Every day | ORAL | 1 refills | Status: DC

## 2023-07-14 DIAGNOSIS — R42 Dizziness and giddiness: Secondary | ICD-10-CM | POA: Diagnosis not present

## 2023-07-21 DIAGNOSIS — Z791 Long term (current) use of non-steroidal anti-inflammatories (NSAID): Secondary | ICD-10-CM | POA: Diagnosis not present

## 2023-07-21 DIAGNOSIS — E785 Hyperlipidemia, unspecified: Secondary | ICD-10-CM | POA: Diagnosis not present

## 2023-07-21 DIAGNOSIS — K219 Gastro-esophageal reflux disease without esophagitis: Secondary | ICD-10-CM | POA: Diagnosis not present

## 2023-07-21 DIAGNOSIS — K259 Gastric ulcer, unspecified as acute or chronic, without hemorrhage or perforation: Secondary | ICD-10-CM | POA: Diagnosis not present

## 2023-07-21 DIAGNOSIS — Z8673 Personal history of transient ischemic attack (TIA), and cerebral infarction without residual deficits: Secondary | ICD-10-CM | POA: Diagnosis not present

## 2023-07-21 DIAGNOSIS — Z800 Family history of malignant neoplasm of digestive organs: Secondary | ICD-10-CM | POA: Diagnosis not present

## 2023-07-21 DIAGNOSIS — Z860100 Personal history of colon polyps, unspecified: Secondary | ICD-10-CM | POA: Diagnosis not present

## 2023-07-21 DIAGNOSIS — Z888 Allergy status to other drugs, medicaments and biological substances status: Secondary | ICD-10-CM | POA: Diagnosis not present

## 2023-07-21 DIAGNOSIS — I10 Essential (primary) hypertension: Secondary | ICD-10-CM | POA: Diagnosis not present

## 2023-07-21 DIAGNOSIS — K648 Other hemorrhoids: Secondary | ICD-10-CM | POA: Diagnosis not present

## 2023-07-21 DIAGNOSIS — K449 Diaphragmatic hernia without obstruction or gangrene: Secondary | ICD-10-CM | POA: Diagnosis not present

## 2023-07-21 DIAGNOSIS — Z880 Allergy status to penicillin: Secondary | ICD-10-CM | POA: Diagnosis not present

## 2023-07-21 DIAGNOSIS — Z85048 Personal history of other malignant neoplasm of rectum, rectosigmoid junction, and anus: Secondary | ICD-10-CM | POA: Diagnosis not present

## 2023-07-21 DIAGNOSIS — F1090 Alcohol use, unspecified, uncomplicated: Secondary | ICD-10-CM | POA: Diagnosis not present

## 2023-07-21 DIAGNOSIS — Z79899 Other long term (current) drug therapy: Secondary | ICD-10-CM | POA: Diagnosis not present

## 2023-07-21 DIAGNOSIS — K5730 Diverticulosis of large intestine without perforation or abscess without bleeding: Secondary | ICD-10-CM | POA: Diagnosis not present

## 2023-07-21 DIAGNOSIS — Z8546 Personal history of malignant neoplasm of prostate: Secondary | ICD-10-CM | POA: Diagnosis not present

## 2023-07-24 DIAGNOSIS — R42 Dizziness and giddiness: Secondary | ICD-10-CM | POA: Diagnosis not present

## 2023-07-27 DIAGNOSIS — R42 Dizziness and giddiness: Secondary | ICD-10-CM | POA: Diagnosis not present

## 2023-07-27 DIAGNOSIS — R200 Anesthesia of skin: Secondary | ICD-10-CM | POA: Diagnosis not present

## 2023-07-27 DIAGNOSIS — G5732 Lesion of lateral popliteal nerve, left lower limb: Secondary | ICD-10-CM | POA: Diagnosis not present

## 2023-07-28 ENCOUNTER — Other Ambulatory Visit: Payer: Self-pay

## 2023-07-29 DIAGNOSIS — R42 Dizziness and giddiness: Secondary | ICD-10-CM | POA: Diagnosis not present

## 2023-07-29 MED ORDER — CLONIDINE HCL 0.3 MG PO TABS: 0.3000 mg | ORAL_TABLET | Freq: Every day | ORAL | 1 refills | Status: DC

## 2023-08-08 IMAGING — CT CT HEART MORP W/ CTA COR W/ SCORE W/ CA W/CM &/OR W/O CM
1 series · 1 of 1 positions shown, 2 images · non-contrast
Comparison: 01/25/2021 chest radiograph. Most recent chest CT
02/21/2021 from [REDACTED].

Addendum:
CLINICAL DATA: 65 Year-old White Male

EXAM:
Cardiac/Coronary  CTA
TECHNIQUE: The patient was scanned on a [employer] scanner.

[Series 364: — · 0.28mm/px · 1 of 1 slices shown, 2 images]
[im 1/1  vessel]
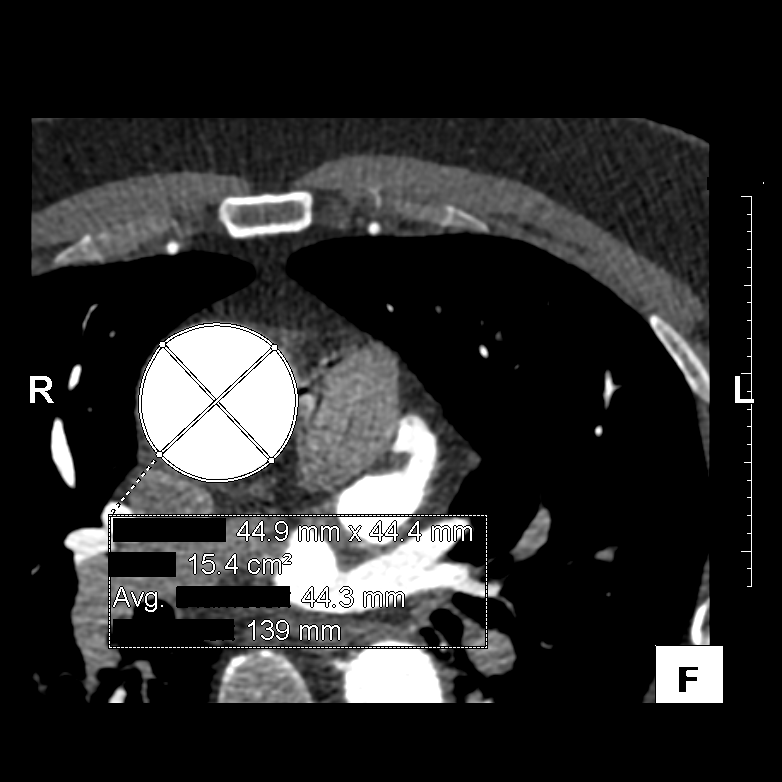
[im 1/1  lung]
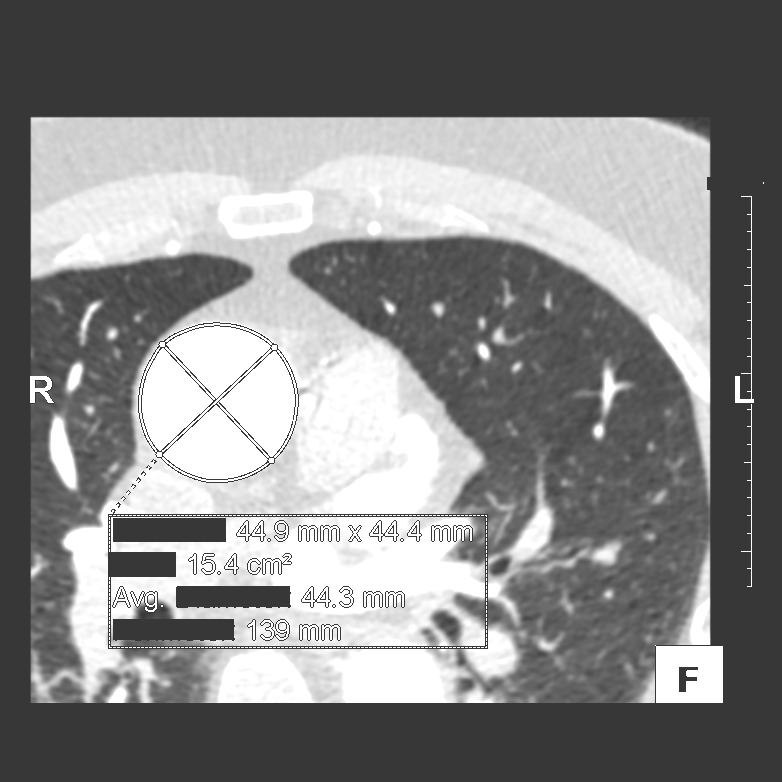

[1 of 1 positions shown; findings below may reference images not displayed]

FINDINGS: Scan was triggered in the descending thoracic aorta. Axial
non-contrast 3 mm slices were carried out through the heart. The
data set was analyzed on a dedicated work station and scored using
the Agatson method. Gantry rotation speed was 250 msecs and
collimation was .6 mm. 0.8 mg of sl NTG was given. The 3D data set
was reconstructed in 5% intervals of the 67-82 % of the R-R cycle.
Diastolic phases were analyzed on a dedicated work station using
MPR, MIP and VRT modes. The patient received 100 cc of contrast.

Aorta: Moderate ascending aortic aneurysm: 45 mm. No calcifications.
No dissection.

Main Pulmonary Artery: Normal size of the pulmonary artery.

Aortic Valve:  Tri-leaflet.  Aortic Valve calcium score 35.

Mitral Valve: Mild mitral annular calcification.

Coronary Arteries:  Normal coronary origin.  Right dominance.

Coronary Calcium Score:

Left main: 0

Left anterior descending artery: 0

Left circumflex artery: 3

Right coronary artery: 26

Total: 29

Percentile: 33rd for age, sex, and race matched control.

RCA is a large dominant artery that gives rise to PDA and PLA. There
are plaques in the R-PDA: Mild calcified lesion, distal to this
there is a severe mixed plaque RCA lesion.

Left main is a large artery that gives rise to LAD and LCX arteries.
There is no significant plaque.

LAD is a large vessel that gives rise to two diagonal branches.
There is no significant plaque.

LCX is a non-dominant artery that gives rise to two OM branch.
Minimal calcified plaque OM1.

Other findings:

Normal variant pulmonary vein drainage into the left atrium,
presence of a right middle pulmonary vein.

Normal left atrial appendage without a thrombus.

Extra-cardiac findings: See attached radiology report for
non-cardiac structures.
IMPRESSION: 1. Coronary calcium score of 29. This was 33rd percentile for age,
sex, and race matched control.

2. Normal coronary origin with right dominance.

3. Moderate ascending aortic aneurysm: 45 mm.

4. CAD-RADS 4 Severe stenosis. (70-99% or > 50% left main). Cardiac
catheterization or CT FFR is recommended. Consider symptom-guided
anti-ischemic pharmacotherapy as well as risk factor modification
per guideline directed care.

RECOMMENDATIONS:



If CAC = 0, it is reasonable to withhold statin therapy and reassess
in 5 to 10 years, as long as higher risk conditions are absent
(diabetes mellitus, family history of premature CHD in first degree
relatives (males <55 years; females <65 years), cigarette smoking,
LDL >=190 mg/dL or other independent risk factors).

If CAC is 1 to 99, it is reasonable to initiate statin therapy for
patients >=55 years of age.

If CAC is >=100 or >=75th percentile, it is reasonable to initiate
statin therapy at any age.

Cardiology referral should be considered for patients with CAC
scores =400 or >=75th percentile.

*7492 AHA/ACC/AACVPR/AAPA/ABC/SHANEICIA/CLINTON/KALALUKA/Janvier/PEDICURE/PAMELLA/CARGO
Guideline on the Management of Blood Cholesterol: A Report of the
American College of Cardiology/American Heart Association Task Force
on Clinical Practice Guidelines. J Am Coll Cardiol.
0686;73(24):1560-1518.

EXAM:
OVER-READ INTERPRETATION  CT CHEST

The following report is a limited chest CT over-read performed by
08/29/2021. This over-read does not include interpretation of cardiac
or coronary anatomy or pathology the coronary CTA interpretation by
the cardiologist is attached.
FINDINGS: Vascular: Ascending aortic dilatation at 4.4 cm on [DATE], similar to
4.6 cm on the prior exam. Aortic atherosclerosis. No central
pulmonary embolism, on this non-dedicated study. Mild dilatation of
a pulmonary artery branch within the left lower lobe on 53/12 has
been present back to a CTA abdomen of 12/01/2017.

Mediastinum/Nodes: No imaged thoracic adenopathy. Small hiatal
hernia.

Lungs/Pleura: No pleural fluid. Right middle lobe 3 mm pulmonary
nodule on 37/13 is similar to on the prior exam and present back to
06/21/2020, considered benign.

a a 4 mm subpleural right lower lobe pulmonary nodule on 39/13 is
also present on the remote CT and can be presumed a benign
subpleural lymph node.

Left base scarring.

Developing 4 mm left lower lobe pulmonary nodule on 63/13 is most
likely a subpleural lymph node but not readily apparent on the
prior.

Upper Abdomen: Normal imaged portions of the liver, spleen,
gallbladder.

Musculoskeletal: No acute osseous abnormality.
IMPRESSION: 1.  No acute findings in the imaged extracardiac chest.
2. Ascending aortic aneurysm, similar at 4.4 cm. Recommend annual
imaging followup by CTA or MRA. This recommendation follows 0414
ACCF/AHA/AATS/ACR/ASA/SCA/Cristhiano/HONAIDA/RASTER/NAZARETH Guidelines for the
Diagnosis and Management of Patients with Thoracic Aortic Disease.
Circulation. 0414; 121: E266-e369. Aortic aneurysm NOS ([id number].X)
3. Pulmonary nodules, including a developing 4 mm probable
subpleural lymph node in the left lower lobe. No follow-up needed if
patient is low-risk. Non-contrast chest CT can be considered in 12
months if patient is high-risk. This recommendation follows the
consensus statement: Guidelines for Management of Incidental
Pulmonary Nodules Detected on CT Images: From the [HOSPITAL]
4. Hiatal hernia.

*** End of Addendum ***
FINDINGS: Scan was triggered in the descending thoracic aorta. Axial
non-contrast 3 mm slices were carried out through the heart. The
data set was analyzed on a dedicated work station and scored using
the Agatson method. Gantry rotation speed was 250 msecs and
collimation was .6 mm. 0.8 mg of sl NTG was given. The 3D data set
was reconstructed in 5% intervals of the 67-82 % of the R-R cycle.
Diastolic phases were analyzed on a dedicated work station using
MPR, MIP and VRT modes. The patient received 100 cc of contrast.

Aorta: Moderate ascending aortic aneurysm: 45 mm. No calcifications.
No dissection.

Main Pulmonary Artery: Normal size of the pulmonary artery.

Aortic Valve:  Tri-leaflet.  Aortic Valve calcium score 35.

Mitral Valve: Mild mitral annular calcification.

Coronary Arteries:  Normal coronary origin.  Right dominance.

Coronary Calcium Score:

Left main: 0

Left anterior descending artery: 0

Left circumflex artery: 3

Right coronary artery: 26

Total: 29

Percentile: 33rd for age, sex, and race matched control.

RCA is a large dominant artery that gives rise to PDA and PLA. There
are plaques in the R-PDA: Mild calcified lesion, distal to this
there is a severe mixed plaque RCA lesion.

Left main is a large artery that gives rise to LAD and LCX arteries.
There is no significant plaque.

LAD is a large vessel that gives rise to two diagonal branches.
There is no significant plaque.

LCX is a non-dominant artery that gives rise to two OM branch.
Minimal calcified plaque OM1.

Other findings:

Normal variant pulmonary vein drainage into the left atrium,
presence of a right middle pulmonary vein.

Normal left atrial appendage without a thrombus.

Extra-cardiac findings: See attached radiology report for
non-cardiac structures.
IMPRESSION: 1. Coronary calcium score of 29. This was 33rd percentile for age,
sex, and race matched control.

2. Normal coronary origin with right dominance.

3. Moderate ascending aortic aneurysm: 45 mm.

4. CAD-RADS 4 Severe stenosis. (70-99% or > 50% left main). Cardiac
catheterization or CT FFR is recommended. Consider symptom-guided
anti-ischemic pharmacotherapy as well as risk factor modification
per guideline directed care.

RECOMMENDATIONS:



If CAC = 0, it is reasonable to withhold statin therapy and reassess
in 5 to 10 years, as long as higher risk conditions are absent
(diabetes mellitus, family history of premature CHD in first degree
relatives (males <55 years; females <65 years), cigarette smoking,
LDL >=190 mg/dL or other independent risk factors).

If CAC is 1 to 99, it is reasonable to initiate statin therapy for
patients >=55 years of age.

If CAC is >=100 or >=75th percentile, it is reasonable to initiate
statin therapy at any age.

Cardiology referral should be considered for patients with CAC
scores =400 or >=75th percentile.

*7492 AHA/ACC/AACVPR/AAPA/ABC/TIGER/SHANEICIA/CLINTON/K[place]AL[place]A/Janvier/PEDICURE/PAMELLA
Guideline on the Management of Blood Cholesterol: A Report of the
American College of Cardiology/American Heart Association Task Force
on Clinical Practice Guidelines. J Am Coll Cardiol.
0686;73(24):1560-1518.

## 2023-08-10 NOTE — Progress Notes (Unsigned)
 Cardiology Office Note:  .   Date:  08/11/2023  ID:  Lazarius Mckenney, DOB [birth year], MRN [id number] PCP: Mercy Stall, MD  [facility] Providers Cardiologist:  Zoe Hinds, MD    History of Present Illness: Makyle Jaskolka is a 68 y.o. male with a past medical history of CAD s/p DES to RPL 05/03/2021, hypertension, bradycardia, thoracic ascending aortic aneurysm, OSA, GERD, CKD stage IIIa, dyslipidemia  08/11/2023 cardiopulmonary exercise test mild to moderate functional limitation, appeared to be ventilatory in nature, recommendations for possible formal PFTs 01/13/2023 CT of the chest revealed mid ascending thoracic aorta measuring 4.6 cm which was previously reported at 4.3 cm 02/06/2022 monitor average heart rate 66 bpm, predominant rhythm was sinus, 8 episodes of atrial tachycardia occurred 02/06/2022 MPI no reversible ischemia or infarction, EF 65% 02/06/2022 echo EF 60 to 65%, trivial MR, dilatation of the ascending aorta 41 mm 10/07/2021 cardiac cath PCI with DES of RPL 08/29/2021 coronary CTA FFR revealed evidence of significant functional stenosis the distal RCA, ascending aortic dilatation of 4.4 cm  [facility] with [facility] and Dr. Sandee Crook in 2020 for management of his hypertension.  In 2023 he began having progressive shortness of breath, underwent a stress evaluation which at the time was normal however coronary CTA was arranged revealing possibly obstructive CAD.  He was evaluated in June 2023 by Dr. Sandee Crook for follow-up after his abnormal coronary CTA for shortness of breath along with unanticipated weight gain, he ultimately underwent a left heart cath with PCI DES to his RPL.  He underwent a cardiopulmonary test in April 2025 for evaluation of persistent shortness of breath which was disproportionate, this revealed mild to moderate functional limitation with recommendations for formal PFTs.  He was evaluated by his PCP on 05/26/2023, continued to be plagued by shortness of  breath, her note indicates he has seen multiple specialists including neurology, cardiology, 3 different pulmonologist, 2 otolaryngologist without a definitive answer and was ultimately awaiting evaluation by neurologist who specializes in musculoskeletal causes of dyspnea but this appointment was not to be till summer 2025.  Mr. Lankford presents today for follow-up of his coronary artery disease.  He is stable from that perspective.  He has been dealing with shortness of breath for several years, has seen multiple specialist as outlined above, was most recently told there are just no contributory findings for his symptoms.  His breathing is relatively debilitating for him, he is not able to exercise.  Upon reviewing his records, it appears he had a CT of his chest at [facility] in October, revealing ascending aortic aneurysm at 4.6 cm which is an increase from the previous imaging so we will plan to repeat this. He denies chest pain, palpitations, pnd, orthopnea, n, v, dizziness, syncope, edema, weight gain, or early satiety.   ROS: Review of Systems  Respiratory:  Positive for shortness of breath.   All other systems reviewed and are negative.    Studies Reviewed: .        Cardiac Studies & Procedures   ______________________________________________________________________________________________ CARDIAC CATHETERIZATION  CARDIAC CATHETERIZATION 10/07/2021  Conclusion   CULPRIT LESION SEGMENT: 2nd RPL-1 lesion is 90% stenosed.  2nd RPL-2 lesion is 80% stenosed.  (FFR ct positive)   2nd RPL-2 lesion is 80% stenosed.   A drug-eluting stent was successfully placed covering both lesions using a SYNERGY XD 2.25X24.  Deployed to 2.3 mm postdilated proximally to 2.5 mm (tapered from 2.5 to 2.3 mm distally.)   Post intervention, there  is a 0% residual stenosis across the entire segment..   Otherwise angiographically normal coronaries; Right Dominant System   There is no aortic valve stenosis.    Relatively normal RHC numbers   --------------------------------------------------------------  SUMMARY Severe single-vessel disease as indicated by Coronary CTA with RPL 2 having tandem 90% and 80% stenoses (CT FFR positive)-this vessel is at least the same size as the LAD Successful DES PCI of RPL 2 covering both lesions with a Synergy DES 2.25 mm x 20 mm postdilated in tapered fashion from 2.5 to 2.3 mm.) Otherwise minimal CAD with relatively small caliber left coronary system and large dominant RCA. Normal LVEF by Echo with mildly elevated LVEDP 18 mmHg Right Heart Cath Numbers: Mean RAP 5 mmHg, RVP-880 P 22/1-6 mmHg; PAP-main 23/8-15 mmHg, PCWP 11 mmHg; LVP-EDP 110/5-18 mmHg, AoP-MAP 109/58-82 mmHg; AO Sat 99%, PA Sat 70%--Fick CO-CI 4.91-2.33 (mildly reduced)  RECOMMENDATIONS Same-day discharge following RPL 2 PCI with DES Monitor closely for blood pressure-in future would recommend avoiding nitrates-had significant drop in blood pressure response IC NTG Not on beta-blocker because of bradycardia-heart rate in the 50s to low 60s.  Continue amlodipine . Increase rosuvastatin  to 40 mg daily DAPT x6 months (ASA 81 mg/Plavix  75 mg); for next 6 months we will continue Plavix  monotherapy without aspirin  to complete 1 year.    Randene Bustard, MD  Findings Coronary Findings Diagnostic  Dominance: Right  Left Main Vessel was injected. Vessel is normal in caliber and large. Vessel is angiographically normal.  Left Anterior Descending Vessel was injected. Vessel is normal in caliber. Vessel is angiographically normal.  First Diagonal Branch Vessel is moderate in size.  First Septal Branch Vessel is small in size.  Second Septal Branch Vessel is small in size.  Left Circumflex Vessel was injected. Vessel is normal in caliber. Vessel is angiographically normal.  Right Coronary Artery Vessel was injected. Vessel is normal in caliber and large. Vessel is angiographically  normal.  Acute Marginal Branch Vessel is small in size.  Right Ventricular Branch Vessel is small in size.  First Right Posterolateral Branch Vessel is moderate in size.  Second Right Posterolateral Branch Vessel is large in size. 2nd RPL-1 lesion is 90% stenosed. The lesion is distal to major branch and concentric. 2nd RPL-2 lesion is 80% stenosed.  Intervention  2nd RPL-1 lesion Stent (Also treats lesions: 2nd RPL-2) Lesion length:  22 mm. CATH LAUNCHER [id number] guide catheter was inserted. Lesion crossed with guidewire using a WIRE ASAHI PROWATER 180CM. Pre-stent angioplasty was performed using a BALLN SAPPHIRE 2.0X15. Maximum pressure:  10 atm. Inflation time:  2 sec. 2 inflations of each lesion A drug-eluting stent was successfully placed using a SYNERGY XD 2.25X24. Maximum pressure: 14 atm. Inflation time: 30 sec. Stent strut is well apposed. Deployed 2.3 mm and with proximal 2/3 of the stent postdilated 2.5 mm Post-stent angioplasty was performed using a BALL SAPPHIRE NC24 2.50X15. Maximum pressure:  16 atm. Inflation time:  20 sec. Proximal 2/3 of stent Tapered postdilation from 2.5 to 2.3 mm. Post-Intervention Lesion Assessment The intervention was successful. Pre-interventional TIMI flow is 3. Post-intervention TIMI flow is 3. Treated lesion length:  24 mm. No complications occurred at this lesion. There is a 0% residual stenosis post intervention.  2nd RPL-2 lesion Stent (Also treats lesions: 2nd RPL-1) See details in 2nd RPL-1 lesion. Post-Intervention Lesion Assessment The intervention was successful. Pre-interventional TIMI flow is 3. Post-intervention TIMI flow is 3. Treated lesion length:  24 mm. No complications occurred at  this lesion. There is a 0% residual stenosis post intervention.   STRESS TESTS  NM MYOCAR MULTI W/SPECT W 02/06/2022  Narrative CLINICAL DATA:  Chest pain, nonspecific anginal equivalent, history of prior  revascularization  EXAM: MYOCARDIAL IMAGING WITH SPECT (REST AND PHARMACOLOGIC-STRESS)  GATED LEFT VENTRICULAR WALL MOTION STUDY  LEFT VENTRICULAR EJECTION FRACTION  TECHNIQUE: Standard myocardial SPECT imaging was performed after resting intravenous injection of 10 mCi Tc-33m tetrofosmin . Subsequently, intravenous infusion of Lexiscan  was performed under the supervision of the Cardiology staff. At peak effect of the drug, 30 mCi Tc-10m tetrofosmin  was injected intravenously and standard myocardial SPECT imaging was performed. Quantitative gated imaging was also performed to evaluate left ventricular wall motion, and estimate left ventricular ejection fraction.  COMPARISON:  CTA chest February 05, 2022.  FINDINGS: Perfusion: No decreased activity in the left ventricle on stress imaging to suggest reversible ischemia or infarction.  Wall Motion: Normal left ventricular wall motion. No left ventricular dilation.  Left Ventricular Ejection Fraction: 65 %  End diastolic volume 93 ml  End systolic volume 31 ml  IMPRESSION: 1. No reversible ischemia or infarction.  2. Normal left ventricular wall motion.  3. Left ventricular ejection fraction 65%  4. Non invasive risk stratification*: Low  *2012 Appropriate Use Criteria for Coronary Revascularization Focused Update: J Am Coll Cardiol. 2012;59(9):857-881. http://content.dementiazones.com.aspx?articleid=1201161   Electronically Signed By: Tama Fails M.D. On: 02/07/2022 09:19   ECHOCARDIOGRAM  ECHOCARDIOGRAM COMPLETE 02/06/2022  Narrative ECHOCARDIOGRAM REPORT    Patient Name:   ESTIBEN VLAD Date of Exam: 02/06/2022 Medical Rec #:  [id number]     Height:       72.0 in Accession #:    [id number]    Weight:       204.0 lb Date of Birth:  12-18-1955     BSA:          2.149 m Patient Age:    66 years      BP:           139/85 mmHg Patient Gender: M             HR:           84 bpm. Exam Location:   Inpatient  Procedure: 2D Echo  Indications:    Chest pain. syncope  History:        Patient has prior history of Echocardiogram examinations, most recent 09/24/2021. CAD, Abnormal ECG, Signs/Symptoms:Chest Pain and Syncope; Risk Factors:Hypertension, Dyslipidemia and Sleep Apnea.  Sonographer:    Dione Franks RDCS Referring Phys: [id number] [facility]   Sonographer Comments: Suboptimal subcostal window. IMPRESSIONS   1. Left ventricular ejection fraction, by estimation, is 60 to 65%. The left ventricle has normal function. The left ventricle has no regional wall motion abnormalities. Left ventricular diastolic parameters are indeterminate. 2. Right ventricular systolic function is normal. The right ventricular size is normal. Tricuspid regurgitation signal is inadequate for assessing PA pressure. 3. The mitral valve is normal in structure. Trivial mitral valve regurgitation. No evidence of mitral stenosis. 4. The aortic valve is normal in structure. Aortic valve regurgitation is mild to moderate. No aortic stenosis is present. 5. There is dilatation of the ascending aorta, measuring 41 mm. 6. The inferior vena cava is normal in size with greater than 50% respiratory variability, suggesting right atrial pressure of 3 mmHg.  FINDINGS Left Ventricle: Left ventricular ejection fraction, by estimation, is 60 to 65%. The left ventricle has normal function. The left ventricle has no regional wall motion  abnormalities. The left ventricular internal cavity size was normal in size. There is no left ventricular hypertrophy. Left ventricular diastolic parameters are indeterminate.  Right Ventricle: The right ventricular size is normal. No increase in right ventricular wall thickness. Right ventricular systolic function is normal. Tricuspid regurgitation signal is inadequate for assessing PA pressure.  Left Atrium: Left atrial size was normal in size.  Right Atrium: Right atrial size was  normal in size.  Pericardium: There is no evidence of pericardial effusion. Presence of epicardial fat layer.  Mitral Valve: The mitral valve is normal in structure. Trivial mitral valve regurgitation. No evidence of mitral valve stenosis.  Tricuspid Valve: The tricuspid valve is normal in structure. Tricuspid valve regurgitation is trivial. No evidence of tricuspid stenosis.  Aortic Valve: The aortic valve is normal in structure. Aortic valve regurgitation is mild to moderate. Aortic regurgitation PHT measures 427 msec. No aortic stenosis is present.  Pulmonic Valve: The pulmonic valve was normal in structure. Pulmonic valve regurgitation is not visualized. No evidence of pulmonic stenosis.  Aorta: There is dilatation of the ascending aorta, measuring 41 mm.  Venous: The inferior vena cava is normal in size with greater than 50% respiratory variability, suggesting right atrial pressure of 3 mmHg.  IAS/Shunts: No atrial level shunt detected by color flow Doppler.   LEFT VENTRICLE PLAX 2D LVIDd:         4.50 cm   Diastology LVIDs:         2.50 cm   LV e' medial:    6.20 cm/s LV PW:         0.90 cm   LV E/e' medial:  12.0 LV IVS:        1.00 cm   LV e' lateral:   9.14 cm/s LVOT diam:     1.90 cm   LV E/e' lateral: 8.1 LV SV:         72 LV SV Index:   33 LVOT Area:     2.84 cm   RIGHT VENTRICLE RV S prime:     16.50 cm/s TAPSE (M-mode): 2.7 cm  LEFT ATRIUM             Index        RIGHT ATRIUM           Index LA diam:        3.80 cm 1.77 cm/m   RA Area:     14.30 cm LA Vol (A2C):   41.0 ml 19.08 ml/m  RA Volume:   34.30 ml  15.96 ml/m LA Vol (A4C):   53.0 ml 24.67 ml/m LA Biplane Vol: 48.1 ml 22.39 ml/m AORTIC VALVE LVOT Vmax:   138.00 cm/s LVOT Vmean:  90.300 cm/s LVOT VTI:    0.253 m AI PHT:      427 msec  AORTA Ao Root diam: 3.60 cm Ao Asc diam:  4.20 cm  MITRAL VALVE MV Area (PHT): 3.77 cm     SHUNTS MV Decel Time: 201 msec     Systemic VTI:  0.25 m MV E  velocity: 74.40 cm/s   Systemic Diam: 1.90 cm MV A velocity: 124.00 cm/s MV E/A ratio:  0.60  Kardie Tobb DO Electronically signed by Jerryl Morin DO Signature Date/Time: 02/06/2022/12:33:35 PM    Final    MONITORS  LONG TERM MONITOR (3-14 DAYS) 03/11/2022  Narrative Patch Wear Time:  13 days and 22 hours (2023-11-08T14:00:06-498 to 2023-11-22T12:51:19-0500)  Patient had a min HR of 45 bpm, max HR of  162 bpm, and avg HR of 66 bpm. Predominant underlying rhythm was Sinus Rhythm.  There were 2 triggered events both sinus rhythm without ectopy.  There were no pauses of 3 seconds or greater and no episodes of second or third-degree AV block.  8 paroxysmal atrial tachycardia runs occurred, the run with the fastest interval lasting 4 beats with a max rate of 162 bpm, the longest lasting 7 beats with an avg rate of 123 bpm. Isolated SVEs were rare (<1.0%), SVE Couplets were rare (<1.0%), and SVE Triplets were rare (<1.0%).  Isolated VEs were rare (<1.0%), VE Couplets were rare (<1.0%), and no VE Triplets were present.   CT SCANS  CT CORONARY FRACTIONAL FLOW RESERVE DATA PREP 08/29/2021  Narrative EXAM: CT-FFR ANALYSIS  CLINICAL DATA:  Possibly obstructive coronary lesion severe distal RCA lesion.  FINDINGS: CT-FFR analysis was performed on the original cardiac CT angiogram dataset. Diagrammatic representation of the CT-FFR analysis is provided in a separate PDF document in PACS. This dictation was created using the PDF document and an interactive 3D model of the results. 3D model is not available in the EMR/PACS. Normal FFR range is >0.80.  1. Left Main: No significant functional stenosis, CT-FFR 0.99.  2. LAD: No significant functional stenosis, CT-FFR 0.99. 3. LCX: No significant functional stenosis, CT-FFR 0.97. 4. RCA: Significant functional stenosis, CT-FFR 0.67 at distal RCA lesion.  IMPRESSION: 1. CT FFR analysis shows evidence of significant functional  stenosis distal RCA.  Gloriann Larger MD   Electronically Signed By: Gloriann Larger M.D. On: 08/29/2021 14:59   CT CORONARY MORPH W/CTA COR W/SCORE 08/29/2021  Addendum 08/29/2021  4:14 PM ADDENDUM REPORT: 08/29/2021 16:12  EXAM: OVER-READ INTERPRETATION  CT CHEST  The following report is a limited chest CT over-read performed by radiologist Dr. Lore Rode of [facility] on 08/29/2021. This over-read does not include interpretation of cardiac or coronary anatomy or pathology the coronary CTA interpretation by the cardiologist is attached.  COMPARISON:  01/25/2021 chest radiograph. Most recent chest CT 02/21/2021 from [facility]  FINDINGS: Vascular: Ascending aortic dilatation at 4.4 cm on 19/2, similar to 4.6 cm on the prior exam. Aortic atherosclerosis. No central pulmonary embolism, on this non-dedicated study. Mild dilatation of a pulmonary artery branch within the left lower lobe on 53/12 has been present back to a CTA abdomen of 12/01/2017.  Mediastinum/Nodes: No imaged thoracic adenopathy. Small hiatal hernia.  Lungs/Pleura: No pleural fluid. Right middle lobe 3 mm pulmonary nodule on 37/13 is similar to on the prior exam and present back to 06/21/2020, considered benign.  a a 4 mm subpleural right lower lobe pulmonary nodule on 39/13 is also present on the remote CT and can be presumed a benign subpleural lymph node.  Left base scarring.  Developing 4 mm left lower lobe pulmonary nodule on 63/13 is most likely a subpleural lymph node but not readily apparent on the prior.  Upper Abdomen: Normal imaged portions of the liver, spleen, gallbladder.  Musculoskeletal: No acute osseous abnormality.  IMPRESSION: 1.  No acute findings in the imaged extracardiac chest. 2. Ascending aortic aneurysm, similar at 4.4 cm. Recommend annual imaging followup by CTA or MRA. This recommendation follows  2010 ACCF/AHA/AATS/ACR/ASA/SCA/SCAI/SIR/STS/SVM Guidelines for the Diagnosis and Management of Patients with Thoracic Aortic Disease. Circulation. 2010; 121: Z610-R604. Aortic aneurysm NOS (ICD10-I71.9) 3. Pulmonary nodules, including a developing 4 mm probable subpleural lymph node in the left lower lobe. No follow-up needed if patient is low-risk. Non-contrast chest CT can  be considered in 12 months if patient is high-risk. This recommendation follows the consensus statement: Guidelines for Management of Incidental Pulmonary Nodules Detected on CT Images: From the Fleischner Society 2017; Radiology 2017; 284:228-243. 4. Hiatal hernia.   Electronically Signed By: Lore Rode M.D. On: 08/29/2021 16:12  Narrative CLINICAL DATA:  68 Year-old White Male  EXAM: Cardiac/Coronary  CTA  TECHNIQUE: The patient was scanned on a [employer].  FINDINGS: Scan was triggered in the descending thoracic aorta. Axial non-contrast 3 mm slices were carried out through the heart. The data set was analyzed on a dedicated work station and scored using the Agatson method. Gantry rotation speed was 250 msecs and collimation was .6 mm. 0.8 mg of sl NTG was given. The 3D data set was reconstructed in 5% intervals of the 67-82 % of the R-R cycle. Diastolic phases were analyzed on a dedicated work station using MPR, MIP and VRT modes. The patient received 100 cc of contrast.  Aorta: Moderate ascending aortic aneurysm: 45 mm. No calcifications. No dissection.  Main Pulmonary Artery: Normal size of the pulmonary artery.  Aortic Valve:  Tri-leaflet.  Aortic Valve calcium  score 35.  Mitral Valve: Mild mitral annular calcification.  Coronary Arteries:  Normal coronary origin.  Right dominance.  Coronary Calcium  Score:  Left main: 0  Left anterior descending artery: 0  Left circumflex artery: 3  Right coronary artery: 26  Total: 29  Percentile: 33rd for age, sex, and race  matched control.  RCA is a large dominant artery that gives rise to PDA and PLA. There are plaques in the R-PDA: Mild calcified lesion, distal to this there is a severe mixed plaque RCA lesion.  Left main is a large artery that gives rise to LAD and LCX arteries. There is no significant plaque.  LAD is a large vessel that gives rise to two diagonal branches. There is no significant plaque.  LCX is a non-dominant artery that gives rise to two OM branch. Minimal calcified plaque OM1.  Other findings:  Normal variant pulmonary vein drainage into the left atrium, presence of a right middle pulmonary vein.  Normal left atrial appendage without a thrombus.  Extra-cardiac findings: See attached radiology report for non-cardiac structures.  IMPRESSION: 1. Coronary calcium  score of 29. This was 33rd percentile for age, sex, and race matched control.  2. Normal coronary origin with right dominance.  3. Moderate ascending aortic aneurysm: 45 mm.  4. CAD-RADS 4 Severe stenosis. (70-99% or > 50% left main). Cardiac catheterization or CT FFR is recommended. Consider symptom-guided anti-ischemic pharmacotherapy as well as risk factor modification per guideline directed care.  RECOMMENDATIONS:  Coronary artery calcium  (CAC) score is a strong predictor of incident coronary heart disease (CHD) and provides predictive information beyond traditional risk factors. CAC scoring is reasonable to use in the decision to withhold, postpone, or initiate statin therapy in intermediate-risk or selected borderline-risk asymptomatic adults (age 33-75 years and LDL-C >=70 to <190 mg/dL) who do not have diabetes or established atherosclerotic cardiovascular disease (ASCVD).* In intermediate-risk (10-year ASCVD risk >=7.5% to <20%) adults or selected borderline-risk (10-year ASCVD risk >=5% to <7.5%) adults in whom a CAC score is measured for the purpose of making a treatment decision the  following recommendations have been made:  If CAC = 0, it is reasonable to withhold statin therapy and reassess in 5 to 10 years, as long as higher risk conditions are absent (diabetes mellitus, family history of premature CHD in first degree relatives (males <  55 years; females <65 years), cigarette smoking, LDL >=190 mg/dL or other independent risk factors).  If CAC is 1 to 99, it is reasonable to initiate statin therapy for patients >=53 years of age.  If CAC is >=100 or >=75th percentile, it is reasonable to initiate statin therapy at any age.  Cardiology referral should be considered for patients with CAC scores =400 or >=75th percentile.  *2018 AHA/ACC/AACVPR/AAPA/ABC/ACPM/ADA/AGS/APhA/ASPC/NLA/PCNA Guideline on the Management of Blood Cholesterol: A Report of the American College of Cardiology/American Heart Association Task Force on Clinical Practice Guidelines. J Am Coll Cardiol. 2019;73(24):3168-3209.  Gloriann Larger, MD  Electronically Signed: By: Gloriann Larger M.D. On: 08/29/2021 14:53     ______________________________________________________________________________________________      Risk Assessment/Calculations:             Physical Exam:   VS:  BP 130/86   Pulse 84   Ht 6' 1.8" (1.875 m)   Wt 200 lb (90.7 kg)   SpO2 97%   BMI 25.82 kg/m    Wt Readings from Last 3 Encounters:  08/11/23 200 lb (90.7 kg)  05/26/23 197 lb (89.4 kg)  03/18/23 198 lb (89.8 kg)    GEN: Well nourished, well developed in no acute distress NECK: No JVD; No carotid bruits CARDIAC: RRR, no murmurs, rubs, gallops RESPIRATORY:  Clear to auscultation without rales, wheezing or rhonchi  ABDOMEN: Soft, non-tender, non-distended EXTREMITIES:  No edema; No deformity   ASSESSMENT AND PLAN: .   CAD -s/p DES to RPL in 2023>> ischemic evaluation that same year revealed no evidence of ischemia.  He has not had recurrent episodes of angina. Stable with no anginal  symptoms. No indication for ischemic evaluation.  Continue Norvasc  2.5 mg daily, continue Plavix  75 mg daily, continue Zetia  10 mg daily, continue Crestor  40 mg daily.  SOB -see HPI, unknown cause, cardiac has been largely ruled out as an etiology.  Thoracic ascending aortic aneurysm-CT of his chest in 2024 revealed the largest measurement 4.6 cm, which was a an increase from his prior exam.  Will repeat a CT of his chest without contrast for surveillance.  He is a non-smoker, no family history, his blood pressure is controlled.  Hypertension-his blood pressure is controlled at 130/86, continue Norvasc  2.5 mg daily, continue clonidine  0.3 mg daily, continue.  Dyslipidemia-formally monitored by his PCP, most recent LDL was very well-controlled at 50 on 05/26/2023, continue Zetia  10 mg daily, continue Crestor  40 mg daily.        Dispo: CT of chest without contrast, follow-up in 6 months.  Signed, Terrance Ferretti, NP

## 2023-08-11 ENCOUNTER — Encounter: Payer: Self-pay | Admitting: Cardiology

## 2023-08-11 ENCOUNTER — Ambulatory Visit: Admitting: Cardiology

## 2023-08-11 ENCOUNTER — Ambulatory Visit: Attending: Cardiology | Admitting: Cardiology

## 2023-08-11 VITALS — BP 130/86 | HR 84 | Ht 73.8 in | Wt 200.0 lb

## 2023-08-11 DIAGNOSIS — I119 Hypertensive heart disease without heart failure: Secondary | ICD-10-CM

## 2023-08-11 DIAGNOSIS — R0602 Shortness of breath: Secondary | ICD-10-CM | POA: Diagnosis not present

## 2023-08-11 DIAGNOSIS — I25118 Atherosclerotic heart disease of native coronary artery with other forms of angina pectoris: Secondary | ICD-10-CM | POA: Diagnosis not present

## 2023-08-11 DIAGNOSIS — I712 Thoracic aortic aneurysm, without rupture, unspecified: Secondary | ICD-10-CM

## 2023-08-11 DIAGNOSIS — E782 Mixed hyperlipidemia: Secondary | ICD-10-CM | POA: Diagnosis not present

## 2023-08-11 DIAGNOSIS — I10 Essential (primary) hypertension: Secondary | ICD-10-CM

## 2023-08-11 NOTE — Patient Instructions (Signed)
 Medication Instructions:  Your physician recommends that you continue on your current medications as directed. Please refer to the Current Medication list given to you today.  *If you need a refill on your cardiac medications before your next appointment, please call your pharmacy*   Lab Work: None ordered If you have labs (blood work) drawn today and your tests are completely normal, you will receive your results only by: MyChart Message (if you have MyChart) OR A paper copy in the mail If you have any lab test that is abnormal or we need to change your treatment, we will call you to review the results.   Testing/Procedures: Non-Cardiac CT scanning, (CAT scanning), is a noninvasive, special x-ray that produces cross-sectional images of the body using x-rays and a computer. CT scans help physicians diagnose and treat medical conditions. For some CT exams, a contrast material is used to enhance visibility in the area of the body being studied. CT scans provide greater clarity and reveal more details than regular x-ray exams.   [facility] at [facility] [address] [place], [place] [address] [phone]  Follow-Up: At [facility], you and your health needs are our priority.  As part of our continuing mission to provide you with exceptional heart care, we have created designated Provider Care Teams.  These Care Teams include your primary Cardiologist (physician) and Advanced Practice Providers (APPs -  Physician Assistants and Nurse Practitioners) who all work together to provide you with the care you need, when you need it.  We recommend signing up for the patient portal called "MyChart".  Sign up information is provided on this After Visit Summary.  MyChart is used to connect with patients for Virtual Visits (Telemedicine).  Patients are able to view lab/test results, encounter notes, upcoming appointments, etc.  Non-urgent messages can be sent to your  provider as well.   To learn more about what you can do with MyChart, go to [email or web address].    Your next appointment:   6 month(s)  The format for your next appointment:   In Person  Provider:   Pattricia Bores, NP Georgeana Kindler)    Other Instructions none  Important Information About Sugar

## 2023-08-14 ENCOUNTER — Other Ambulatory Visit: Payer: Self-pay

## 2023-08-14 DIAGNOSIS — E782 Mixed hyperlipidemia: Secondary | ICD-10-CM

## 2023-08-14 MED ORDER — ROSUVASTATIN CALCIUM 40 MG PO TABS: 40.0000 mg | ORAL_TABLET | Freq: Every day | ORAL | 0 refills | Status: DC

## 2023-08-21 ENCOUNTER — Ambulatory Visit (INDEPENDENT_AMBULATORY_CARE_PROVIDER_SITE_OTHER)
Admission: RE | Admit: 2023-08-21 | Discharge: 2023-08-21 | Disposition: A | Discharge status: Home / Self Care | Source: Ambulatory Visit | Attending: Cardiology | Admitting: Cardiology

## 2023-08-21 ENCOUNTER — Encounter: Payer: Self-pay | Admitting: Internal Medicine

## 2023-08-21 ENCOUNTER — Ambulatory Visit: Payer: Medicare HMO | Admitting: Internal Medicine

## 2023-08-21 VITALS — BP 118/76 | HR 73 | Resp 16

## 2023-08-21 DIAGNOSIS — R942 Abnormal results of pulmonary function studies: Secondary | ICD-10-CM

## 2023-08-21 DIAGNOSIS — J3089 Other allergic rhinitis: Secondary | ICD-10-CM | POA: Diagnosis not present

## 2023-08-21 DIAGNOSIS — I712 Thoracic aortic aneurysm, without rupture, unspecified: Secondary | ICD-10-CM

## 2023-08-21 DIAGNOSIS — I7121 Aneurysm of the ascending aorta, without rupture: Secondary | ICD-10-CM

## 2023-08-21 DIAGNOSIS — G4733 Obstructive sleep apnea (adult) (pediatric): Secondary | ICD-10-CM | POA: Diagnosis not present

## 2023-08-21 DIAGNOSIS — K219 Gastro-esophageal reflux disease without esophagitis: Secondary | ICD-10-CM | POA: Diagnosis not present

## 2023-08-21 DIAGNOSIS — R0609 Other forms of dyspnea: Secondary | ICD-10-CM

## 2023-08-21 DIAGNOSIS — I700 Atherosclerosis of aorta: Secondary | ICD-10-CM | POA: Diagnosis not present

## 2023-08-21 NOTE — Patient Instructions (Addendum)
 Allergy  testing previously showed positive to grass pollen and weed pollen  CT Scan : mild CRS and deviated septum   Full PFTs with MIP/MEP 06/12/23: CONCLUSION   Grade AA study. Mild simple restrictive impairment with a mild gas transfer impairment. MIP and MEP are below lower limit of normal.   Overall it looks like the cause of your shortness of breath is due to neuromuscular weakness.  We defined this with the abnormal MIP/MEP cited above.   Let Dr. Sulema Endo know, that neurology (Dr. 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 Ask if there is someone in [facility] they recommend for work up of neuromuscular respiratory weakness.   Let Dr. 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   Daron Ellen, MD  [address]  [place], [place] [address]  [phone] (Work)  [phone] (Fax)   Seasonal allergic Rhinitis with chronic sinusitis, controlled  - Continue allergy  avoidance measures to pollen   - Continue Nasal Steroid Spray: Options include Flonase  (fluticasone ), Nasocort (triamcinolone ), Nasonex  (mometasome) 1- 2 sprays in each nostril daily (can buy over-the-counter if not covered by insurance)  Best results if used daily. - Continue Astelin  (Azelastine ) 1-2 sprays in each nostril twice a day as needed.  You may use this as needed for nasal congestion/itchy ears/itchy nose if desired - Continue Atrovent  (Ipratropium Bromide ) 1-2 sprays in each nostril up to 3 times a day as needed for runny nose/post nasal drip/drainage.  Use less frequently if airway gets too dry. - Continue Singulair  (Montelukast ) 10mg  nightly. - Continue over the counter antihistamine daily or daily as needed.   -Your options include Zyrtec (Cetirizine) 10mg , Claritin (Loratadine) 10mg , Allegra (Fexofenadine) 180mg , or Xyzal  (Levocetirinze) 5mg   Follow up: 6 months   Thank you so much for letting me partake in your care today.  Don't hesitate to reach out if you have any additional concerns!  Orelia Binet, MD  [facility] [place] [place]

## 2023-08-21 NOTE — Progress Notes (Signed)
 Follow Up Note  RE: Joe Arroyo MRN: [id number] DOB: December 09, 1955 Date of Office Visit: 08/21/2023  Referring provider: Mercy Stall, MD Primary care provider: Mercy Stall, MD  Chief Complaint: Allergies and Gastroesophageal Reflux  History of Present Illness: I had the pleasure of seeing Joe Arroyo for a follow up visit at the [facility] on 09/01/2023. He is a 68 y.o. male with complicated cardiopulmonary history who is being followed for CRS, allergic rhinitis. His previous allergy  office visit was on 09/05/22 with Dr. Jolayne Natter. Today is a regular follow up visit.  History obtained from patient, chart review.   Discussed the use of AI scribe software for clinical note transcription with the patient, who gave verbal consent to proceed.  History of Present Illness Joe Arroyo "Joe Arroyo" is a 68 year old male who presents with shortness of breath.  He has been experiencing ongoing shortness of breath and dizziness, which have been evaluated by multiple specialists including neurology, cardiology, and pulmonary. Despite extensive workup, the cause of his symptoms remains unclear. (Please see previous notes for work up summary).  Pulmonary function tests conducted in February revealed concerns for neuromuscular causes, with abnormal maximal inspiratory pressure (MIP) and maximal expiratory pressure (MEP).  He has undergone various imaging studies, including high-resolution CT scans and X-rays, which did not reveal any lung abnormalities. Changes in weather exacerbate his symptoms, but he has not experienced any new symptoms recently. No eyelid or eye weakness is reported.  The neurologist focused on his feet, diagnosing early stages of neuropathy, and did not address his breathing issues. He has been experiencing these symptoms for an unspecified duration, and they have not been resolved by previous consultations.  He did not specifically ask about neuromuscular causes of  dyspnea with neurology.    His upper airway symptoms are controlled with montelukast  and requests a refill. GERD is well controlled as well.       Full PFTs with MIP/MEP 06/12/23: CONCLUSION   Grade AA study. Mild simple restrictive impairment with a mild gas transfer impairment. MIP and MEP are below lower limit of normal.   Assessment and Plan: Abelardo is a 68 y.o. male with: Other allergic rhinitis  Dyspnea on exertion  Gastroesophageal reflux disease without esophagitis  OSA (obstructive sleep apnea)  Abnormal PFTs (pulmonary function tests)   Plan: Patient Instructions  Allergy  testing previously showed positive to grass pollen and weed pollen  CT Scan : mild CRS and deviated septum   Full PFTs with MIP/MEP 06/12/23: CONCLUSION   Grade AA study. Mild simple restrictive impairment with a mild gas transfer impairment. MIP and MEP are below lower limit of normal.   Overall it looks like the cause of your shortness of breath is due to neuromuscular weakness.  We defined this with the abnormal MIP/MEP cited above.   Let Dr. Sulema Endo know, that neurology (Dr. 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 Ask if there is someone in [facility] they recommend for work up of neuromuscular respiratory weakness.   Let Dr. 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   Daron Ellen, MD  [address]  [place], [place] [address]  [phone] (Work)  [phone] (Fax)   Seasonal allergic Rhinitis with chronic sinusitis, controlled  - Continue allergy  avoidance measures to pollen   - Continue Nasal Steroid Spray: Options include Flonase  (fluticasone ), Nasocort (  triamcinolone ), Nasonex  (mometasome) 1- 2 sprays in each nostril daily (can buy over-the-counter if not covered by insurance)  Best results if used daily. - Continue Astelin  (Azelastine )  1-2 sprays in each nostril twice a day as needed.  You may use this as needed for nasal congestion/itchy ears/itchy nose if desired - Continue Atrovent  (Ipratropium Bromide ) 1-2 sprays in each nostril up to 3 times a day as needed for runny nose/post nasal drip/drainage.  Use less frequently if airway gets too dry. - Continue Singulair  (Montelukast ) 10mg  nightly. - Continue over the counter antihistamine daily or daily as needed.   -Your options include Zyrtec (Cetirizine) 10mg , Claritin (Loratadine) 10mg , Allegra (Fexofenadine) 180mg , or Xyzal  (Levocetirinze) 5mg   Follow up: 6 months  Thank you so much for letting me partake in your care today.  Don't hesitate to reach out if you have any additional concerns!  Orelia Binet, MD  [facility] [place] [place]   Meds ordered this encounter  Medications   montelukast  (SINGULAIR ) 10 MG tablet    Sig: Take 1 tablet (10 mg total) by mouth at bedtime.    Dispense:  30 tablet    Refill:  5    Lab Orders  No laboratory test(s) ordered today   Diagnostics: Spirometry:  Tracings reviewed. His effort: It was hard to get consistent efforts and there is a question as to whether this reflects a maximal maneuver. FVC: 3.10L FEV1: 2.53L, 73% predicted FEV1/FVC ratio: 82% Interpretation: nonobstructive ratio, reduced FVC .  Please see scanned spirometry results for details.   Medication List:  Current Outpatient Medications  Medication Sig Dispense Refill   amLODipine  (NORVASC ) 2.5 MG tablet Take 1 tablet (2.5 mg total) by mouth daily. for blood pressure 90 tablet 1   azelastine  (ASTELIN ) 0.1 % nasal spray 1-2 sprays in each nostril twice a day as needed 30 mL 5   clopidogrel  (PLAVIX ) 75 MG tablet Take 1 tablet (75 mg total) by mouth daily. 90 tablet 1   esomeprazole (NEXIUM) 10 MG packet Take 10 mg by mouth daily before breakfast.     ezetimibe  (ZETIA ) 10 MG tablet Take 1 tablet (10 mg total) by mouth daily. 90 tablet 1    finasteride (PROSCAR) 5 MG tablet Take 5 mg by mouth daily.     fluticasone  (FLONASE ) 50 MCG/ACT nasal spray 1-2 sprays in each nostril daily 16 g 5   furosemide  (LASIX ) 40 MG tablet Take 1 tablet (40 mg total) by mouth 2 (two) times daily. 180 tablet 1   ipratropium (ATROVENT ) 0.06 % nasal spray 1-2 sprays in each nostril up to 3 times a day as needed for runny nose/post nasal drip/drainage 15 mL 5   levocetirizine (XYZAL ) 5 MG tablet Take 1 tablet (5 mg total) by mouth every evening. 30 tablet 5   magnesium oxide (MAG-OX) 400 (240 Mg) MG tablet Take 400 mg by mouth daily.     Multiple Vitamins-Minerals (CENTRUM SILVER ULTRA MENS PO) Take 1 tablet by mouth daily.     Omega-3 Fatty Acids (FISH OIL PO) Take 1 tablet by mouth 2 (two) times daily.     zinc gluconate 50 MG tablet Take 50 mg by mouth daily.     Albuterol-Budesonide (AIRSUPRA ) 90-80 MCG/ACT AERO Inhale 2 puffs into the lungs 4 (four) times daily as needed (shortness of breath).     budesonide-glycopyrrolate-formoterol  (BREZTRI  AEROSPHERE) 160-9-4.8 MCG/ACT AERO inhaler Inhale 2 puffs into the lungs in the morning and at bedtime.  cloNIDine  (CATAPRES ) 0.3 MG tablet Take 1 tablet (0.3 mg total) by mouth daily. 90 tablet 1   montelukast  (SINGULAIR ) 10 MG tablet Take 1 tablet (10 mg total) by mouth at bedtime. 30 tablet 5   rosuvastatin  (CRESTOR ) 40 MG tablet Take 1 tablet (40 mg total) by mouth daily. 90 tablet 0   No current facility-administered medications for this visit.   Allergies: Allergies  Allergen Reactions   Nitroglycerin  Other (See Comments)    Severe hypotension, caused 100 point drop in systolic BP   Beta Adrenergic Blockers Other (See Comments)    bradycardia   Bystolic [Nebivolol Hcl] Other (See Comments)    bradycardia   Carvedilol Other (See Comments)    bradycardia   Pravastatin Other (See Comments)    "made my joints hurt"   Tamsulosin Other (See Comments)    Syncope   Hctz [Hydrochlorothiazide] Rash    Hydralazine Rash   Penicillins Rash   I reviewed his past medical history, social history, family history, and environmental history and no significant changes have been reported from his previous visit.  ROS: All others negative except as noted per HPI.   Objective: BP 118/76   Pulse 73   Resp 16   SpO2 96%  There is no height or weight on file to calculate BMI. General Appearance:  Alert, cooperative, no distress, appears stated age  Head:  Normocephalic, without obvious abnormality, atraumatic  Eyes:  Conjunctiva clear, EOM's intact  Nose: Nares normal, normal mucosa, no visible anterior polyps, and septum midline  Throat: Lips, tongue normal; teeth and gums normal, normal posterior oropharynx  Neck: Supple, symmetrical  Lungs:   clear to auscultation bilaterally, Respirations  slightly laboredlabored, no coughing  Heart:  regular rate and rhythm and no murmur, Appears well perfused  Extremities: No edema  Skin: Skin color, texture, turgor normal, no rashes or lesions on visualized portions of skin  Neurologic: No gross deficits   Previous notes and tests were reviewed. The plan was reviewed with the patient/family, and all questions/concerned were addressed.  It was my pleasure to see Joe Arroyo today and participate in his care. Please feel free to contact me with any questions or concerns.  Sincerely,  Orelia Binet, MD  Allergy  & Immunology  [facility] [place]  [place] Office: [phone]

## 2023-08-26 NOTE — Progress Notes (Signed)
 Subjective:  Patient ID: Joe Arroyo, male    DOB: [birth year]  Age: 68 y.o. MRN: [id number]  Chief Complaint  Patient presents with   Medical Management of Chronic Issues    HPI: CORONARY ARTERY DISEASE: on plavix , aspirin , crestor , zetia , fish oil 1 tablet twice daily.   BPH: on proscar.   HYPERTENSION:  on clonidine  0.3 mg once daily and Norvasc  2.5 mg daily.    GERD: On nexium 40 mg daily.   Hyperlipidemia: Fish oil 1 tablet twice daily. Zetia  10 mg daily.   OSA: Mild. Patient has refused cpap.   Shortness of breath waxes and wanes, but still bad particularly when it rains. No true etiology found for his shortness of breath, despite an incredibly thorough work up (cardiology, neurology, pulmonology, allergy , and ENT.) Numerous of all specialist. Patient has tried several inhalers. Although he does have cardiac disease this is well managed and not felt to be the etiology of his shortness of breath/dyspnea on exertion. Recently saw neurology at [facility] [place] to rule out a neuromuscular disease process. 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      08/27/2023    9:02 AM 05/26/2023    9:59 AM 12/23/2022   11:37 AM 11/11/2022    2:44 PM 09/16/2022    9:23 AM  Depression screen PHQ 2/9  Decreased Interest 0 0 1 0 0  Down, Depressed, Hopeless 0 0 0 0 0  PHQ - 2 Score 0 0 1 0 0  Altered sleeping   0 0 0  Tired, decreased energy   1 0 1  Change in appetite   0 0 0  Feeling bad or failure about yourself    0 0 0  Trouble concentrating   0 0 0  Moving slowly or fidgety/restless   0 0 0  Suicidal thoughts   0 0 0  PHQ-9 Score   2 0 1  Difficult doing work/chores   Somewhat difficult Not difficult at all Not difficult at all        08/27/2023    9:02 AM  Fall Risk   Falls in the past year? 0  Number falls in past yr: 0  Injury with Fall? 0  Risk for fall due to : No Fall Risks  Follow up Falls evaluation completed    Patient Care  Team: Mercy Stall, MD as PCP - General (Internal Medicine) Hassan Links, MD as PCP - Cardiology (Cardiology) Mercy Stall, MD as Referring Physician (Internal Medicine) Hassan Links, MD as Consulting Physician (Cardiology) Wilfredo Hanly, MD as Consulting Physician (Pulmonary Disease) Wally Gunnels, MD as Consulting Physician (Urology)   Review of Systems  Constitutional:  Negative for chills, fatigue, fever and unexpected weight change.  HENT:  Negative for congestion, ear pain, sinus pain and sore throat.   Respiratory:  Positive for shortness of breath (DOE). Negative for cough.   Cardiovascular:  Negative for chest pain and palpitations.  Gastrointestinal:  Negative for abdominal pain, blood in stool, constipation, diarrhea, nausea and vomiting.  Endocrine: Negative for polydipsia.  Genitourinary:  Negative for dysuria.  Musculoskeletal:  Negative for back pain.  Skin:  Negative for rash.  Neurological:  Negative for headaches.    Current Outpatient Medications on File Prior to Visit  Medication Sig Dispense Refill   amLODipine  (NORVASC ) 2.5 MG tablet Take 1 tablet (2.5 mg total) by mouth daily. for blood pressure 90 tablet 1  azelastine  (ASTELIN ) 0.1 % nasal spray 1-2 sprays in each nostril twice a day as needed 30 mL 5   clopidogrel  (PLAVIX ) 75 MG tablet Take 1 tablet (75 mg total) by mouth daily. 90 tablet 1   esomeprazole (NEXIUM) 10 MG packet Take 10 mg by mouth daily before breakfast.     ezetimibe  (ZETIA ) 10 MG tablet Take 1 tablet (10 mg total) by mouth daily. 90 tablet 1   finasteride (PROSCAR) 5 MG tablet Take 5 mg by mouth daily.     fluticasone  (FLONASE ) 50 MCG/ACT nasal spray 1-2 sprays in each nostril daily 16 g 5   furosemide  (LASIX ) 40 MG tablet Take 1 tablet (40 mg total) by mouth 2 (two) times daily. 180 tablet 1   ipratropium (ATROVENT ) 0.06 % nasal spray 1-2 sprays in each nostril up to 3 times a day as needed for runny nose/post nasal drip/drainage  15 mL 5   levocetirizine (XYZAL ) 5 MG tablet Take 1 tablet (5 mg total) by mouth every evening. 30 tablet 5   magnesium oxide (MAG-OX) 400 (240 Mg) MG tablet Take 400 mg by mouth daily.     montelukast  (SINGULAIR ) 10 MG tablet Take 1 tablet (10 mg total) by mouth at bedtime. 30 tablet 3   Multiple Vitamins-Minerals (CENTRUM SILVER ULTRA MENS PO) Take 1 tablet by mouth daily.     Omega-3 Fatty Acids (FISH OIL PO) Take 1 tablet by mouth 2 (two) times daily.     zinc gluconate 50 MG tablet Take 50 mg by mouth daily.     No current facility-administered medications on file prior to visit.   Past Medical History:  Diagnosis Date   Abnormal cardiac CT angiography    Atypical chest pain 01/07/2021   Chronic idiopathic constipation 01/07/2021   Coronary artery disease    Dyspnea on exertion 09/05/2019   Essential hypertension, benign 05/14/2018   GERD (gastroesophageal reflux disease) 09/28/2020   Hiatal hernia 09/28/2020   History of hiatal hernia    History of kidney stones    Hyperlipidemia 05/14/2018   Hypertensive heart disease 05/14/2018   09/17/2021: SUMMARY  Severe single-vessel disease as indicated by Coronary CTA with RPL 2 having tandem 90% and 80% stenoses (CT FFR positive)-this vessel is at least the same size as the LAD  Successful DES PCI of RPL 2 covering both lesions with a Synergy DES 2.25 mm x 20 mm postdilated in tapered fashion from 2.5 to 2.3 mm.)  Otherwise minimal CAD with relatively small caliber left coronary syst   Non-seasonal allergic rhinitis due to pollen 08/16/2019   OSA (obstructive sleep apnea) 09/28/2020   Other chronic sinusitis 05/02/2022   Other fatigue 03/17/2022   Other seasonal allergic rhinitis 05/02/2022   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   Shortness of breath 09/05/2019   Sinus bradycardia 05/14/2018   Syncope 09/05/2019   Thoracic ascending aortic aneurysm (HCC)    ascending TAA 4.6 x 4.4 cm 10/03/20 CTA chest   Past Surgical History:   Procedure Laterality Date   COLONOSCOPY     CORONARY STENT INTERVENTION N/A 10/07/2021   Procedure: CORONARY STENT INTERVENTION;  Surgeon: Arleen Lacer, MD;  Location: [facility] INVASIVE CV LAB;  Service: Cardiovascular;  Laterality: N/A;   ESOPHAGOGASTRODUODENOSCOPY N/A 12/05/2020   Procedure: ESOPHAGOGASTRODUODENOSCOPY (EGD);  Surgeon: Hilarie Lovely, MD;  Location: [facility] OR;  Service: Thoracic;  Laterality: N/A;   LAMINECTOMY     MOLE REMOVAL     PROSTATE BIOPSY     RIGHT/LEFT HEART CATH  AND CORONARY ANGIOGRAPHY N/A 10/07/2021   Procedure: RIGHT/LEFT HEART CATH AND CORONARY ANGIOGRAPHY;  Surgeon: Arleen Lacer, MD;  Location: [facility] INVASIVE CV LAB;  Service: Cardiovascular;  Laterality: N/A;   TONSILLECTOMY AND ADENOIDECTOMY     UPPER GASTROINTESTINAL ENDOSCOPY     XI ROBOTIC ASSISTED PARAESOPHAGEAL HERNIA REPAIR N/A 12/05/2020   Procedure: XI ROBOTIC ASSISTED PARAESOPHAGEAL HERNIA REPAIR AND FUNDOPLICATION;  Surgeon: Hilarie Lovely, MD;  Location: [facility] OR;  Service: Thoracic;  Laterality: N/A;    Family History  Problem Relation Age of Onset   Hyperlipidemia Mother    Aortic stenosis Mother    Valvular heart disease Mother    Colon cancer Mother    Diabetes Mellitus II Mother    Cancer - Colon Mother    Cancer Mother    Stroke Mother    Cerebrovascular Accident Mother    Diabetes Mother    Hypertension Mother    Hyperlipidemia Father    Diabetes Mellitus II Father    Diabetes Father    Alzheimer's disease Father    Esophageal cancer Paternal Uncle    CAD Maternal Grandmother    Congestive Heart Failure Maternal Grandmother    Heart attack Maternal Grandmother    Heart failure Maternal Grandmother    Coronary artery disease Maternal Grandmother    Hyperlipidemia Maternal Grandmother    Social History   Socioeconomic History   Marital status: Single    Spouse name: Not on file   Number of children: Not on file   Years of education: Not on file   Highest education  level: Not on file  Occupational History   Not on file  Tobacco Use   Smoking status: Never   Smokeless tobacco: Never  Vaping Use   Vaping status: Never Used  Substance and Sexual Activity   Alcohol use: Yes    Comment: occasional   Drug use: Never   Sexual activity: Not on file  Other Topics Concern   Not on file  Social History Narrative   Not on file   Social Drivers of Health   Financial Resource Strain: Low Risk  (11/11/2022)   Overall Financial Resource Strain (CARDIA)    Difficulty of Paying Living Expenses: Not hard at all  Food Insecurity: No Food Insecurity (11/11/2022)   Hunger Vital Sign    Worried About Running Out of Food in the Last Year: Never true    Ran Out of Food in the Last Year: Never true  Transportation Needs: No Transportation Needs (11/11/2022)   PRAPARE - Administrator, Civil Service (Medical): No    Lack of Transportation (Non-Medical): No  Physical Activity: Sufficiently Active (11/11/2022)   Exercise Vital Sign    Days of Exercise per Week: 5 days    Minutes of Exercise per Session: 30 min  Stress: No Stress Concern Present (11/11/2022)   [employer] of Occupational Health - Occupational Stress Questionnaire    Feeling of Stress : Not at all  Social Connections: Socially Isolated (11/11/2022)   Social Connection and Isolation Panel [NHANES]    Frequency of Communication with Friends and Family: More than three times a week    Frequency of Social Gatherings with Friends and Family: More than three times a week    Attends Religious Services: Never    Database administrator or Organizations: No    Attends Banker Meetings: Never    Marital Status: Never married    Objective:  BP 136/76  Pulse 79   Temp 98.2 F (36.8 C)   Ht 6\' 1"  (1.854 m)   Wt 196 lb (88.9 kg)   SpO2 96%   BMI 25.86 kg/m      08/27/2023    8:59 AM 08/21/2023   11:00 AM 08/11/2023   10:48 AM  BP/Weight  Systolic BP 136 118 130   Diastolic BP 76 76 86  Wt. (Lbs) 196  200  BMI 25.86 kg/m2  25.82 kg/m2    Physical Exam Vitals reviewed.  Constitutional:      Appearance: Normal appearance.  Neck:     Vascular: No carotid bruit.  Cardiovascular:     Rate and Rhythm: Normal rate and regular rhythm.     Heart sounds: Normal heart sounds.  Pulmonary:     Effort: Pulmonary effort is normal.     Breath sounds: Normal breath sounds. No wheezing, rhonchi or rales.  Abdominal:     General: Bowel sounds are normal.     Palpations: Abdomen is soft.     Tenderness: There is no abdominal tenderness.  Neurological:     Mental Status: He is alert.  Psychiatric:        Mood and Affect: Mood normal.        Behavior: Behavior normal.     Diabetic Foot Exam - Simple   No data filed      Lab Results  Component Value Date   WBC 8.6 08/27/2023   HGB 13.2 08/27/2023   HCT 43.2 08/27/2023   PLT 261 08/27/2023   GLUCOSE 81 08/27/2023   CHOL 122 08/27/2023   TRIG 184 (H) 08/27/2023   HDL 42 08/27/2023   LDLCALC 50 08/27/2023   ALT 17 08/27/2023   AST 29 08/27/2023   NA 143 08/27/2023   K 3.4 (L) 08/27/2023   CL 99 08/27/2023   CREATININE 1.55 (H) 08/27/2023   BUN 10 08/27/2023   CO2 21 08/27/2023   TSH 2.710 05/26/2023   INR 1.0 12/03/2020      Assessment & Plan:  Hypertensive heart disease without heart failure Assessment & Plan: Well controlled.  No changes to medicines. Continue on clonidine  0.3 mg once daily and Norvasc  2.5 mg twice daily.      Orders: -     CBC with Differential/Platelet -     Comprehensive metabolic panel with GFR -     cloNIDine  HCl; Take 1 tablet (0.3 mg total) by mouth daily.  Dispense: 90 tablet; Refill: 1  Mixed hyperlipidemia Assessment & Plan: Well controlled.  No changes to medicines. Fish oil 1 tablet twice daily. Zetia  10 mg daily.  Continue to work on eating a healthy diet and exercise.     Orders: -     Lipid panel -     Rosuvastatin  Calcium ; Take 1  tablet (40 mg total) by mouth daily.  Dispense: 90 tablet; Refill: 0  Stage 3a chronic kidney disease (HCC) Assessment & Plan: Check labs    Gastroesophageal reflux disease without esophagitis Assessment & Plan: Continue protonix  40 mg daily.    SOB (shortness of breath) Assessment & Plan: Trial on breztri  2 puffs twice daily. Specifically start prior to a weather change and then use airsupra  2 puffs four times a day as needed shortness of breath.  Samples given .  No true etiology found for his shortness of breath, despite an incredibly thorough work up (cardiology, neurology, pulmonology, allergy , and ENT.) Numerous of all specialist.   Orders: -  Breztri  Aerosphere; Inhale 2 puffs into the lungs in the morning and at bedtime. -     Airsupra ; Inhale 2 puffs into the lungs 4 (four) times daily as needed (shortness of breath).     Meds ordered this encounter  Medications   cloNIDine  (CATAPRES ) 0.3 MG tablet    Sig: Take 1 tablet (0.3 mg total) by mouth daily.    Dispense:  90 tablet    Refill:  1   rosuvastatin  (CRESTOR ) 40 MG tablet    Sig: Take 1 tablet (40 mg total) by mouth daily.    Dispense:  90 tablet    Refill:  0    Dose increase.   budesonide-glycopyrrolate-formoterol  (BREZTRI  AEROSPHERE) 160-9-4.8 MCG/ACT AERO inhaler    Sig: Inhale 2 puffs into the lungs in the morning and at bedtime.   Albuterol-Budesonide (AIRSUPRA ) 90-80 MCG/ACT AERO    Sig: Inhale 2 puffs into the lungs 4 (four) times daily as needed (shortness of breath).    Orders Placed This Encounter  Procedures   CBC with Differential/Platelet   Comprehensive metabolic panel with GFR   Lipid panel     Follow-up: Return in about 3 months (around 11/27/2023) for chronic follow up.   I,Marla I Leal-Borjas,acting as a scribe for Mercy Stall, MD.,have documented all relevant documentation on the behalf of Mercy Stall, MD,as directed by  Mercy Stall, MD while in the presence of Mercy Stall, MD.    An After Visit Summary was printed and given to the patient.  I attest that I have reviewed this visit and agree with the plan scribed by my staff.   Mercy Stall, MD [facility] [phone]

## 2023-08-27 ENCOUNTER — Ambulatory Visit (INDEPENDENT_AMBULATORY_CARE_PROVIDER_SITE_OTHER): Payer: Medicare HMO | Admitting: Family Medicine

## 2023-08-27 ENCOUNTER — Encounter: Payer: Self-pay | Admitting: Family Medicine

## 2023-08-27 VITALS — BP 136/76 | HR 79 | Temp 98.2°F | Ht 73.0 in | Wt 196.0 lb

## 2023-08-27 DIAGNOSIS — E782 Mixed hyperlipidemia: Secondary | ICD-10-CM

## 2023-08-27 DIAGNOSIS — K219 Gastro-esophageal reflux disease without esophagitis: Secondary | ICD-10-CM

## 2023-08-27 DIAGNOSIS — R0602 Shortness of breath: Secondary | ICD-10-CM | POA: Diagnosis not present

## 2023-08-27 DIAGNOSIS — I119 Hypertensive heart disease without heart failure: Secondary | ICD-10-CM | POA: Diagnosis not present

## 2023-08-27 DIAGNOSIS — N1831 Chronic kidney disease, stage 3a: Secondary | ICD-10-CM

## 2023-08-27 MED ORDER — ROSUVASTATIN CALCIUM 40 MG PO TABS: 40.0000 mg | ORAL_TABLET | Freq: Every day | ORAL | 0 refills | Status: DC

## 2023-08-27 MED ORDER — CLONIDINE HCL 0.3 MG PO TABS
0.3000 mg | ORAL_TABLET | Freq: Every day | ORAL | 1 refills | Status: AC
Start: 2023-08-27 — End: ?

## 2023-08-27 MED ORDER — AIRSUPRA 90-80 MCG/ACT IN AERO: 2.0000 | INHALATION_SPRAY | Freq: Four times a day (QID) | RESPIRATORY_TRACT | Status: DC | PRN

## 2023-08-27 MED ORDER — BREZTRI AEROSPHERE 160-9-4.8 MCG/ACT IN AERO
2.0000 | INHALATION_SPRAY | Freq: Two times a day (BID) | RESPIRATORY_TRACT | Status: DC
Start: 2023-08-27 — End: 2023-12-09

## 2023-08-27 NOTE — Patient Instructions (Signed)
 If you anticipate worsening shortness of breath due to weather, start breztri 2 puffs twice daily and use airsuppra 2 puffs four times a day as needed shortness of breath. Rinse mouth after use of each of these.  Let me know if it helps.  Have a good summer! Dr. Reinhold Carbine

## 2023-08-28 LAB — COMPREHENSIVE METABOLIC PANEL WITH GFR
ALT: 17 IU/L (ref 0–44)
AST: 29 IU/L (ref 0–40)
Albumin: 4.6 g/dL (ref 3.9–4.9)
Alkaline Phosphatase: 122 IU/L — ABNORMAL HIGH (ref 44–121)
BUN/Creatinine Ratio: 6 — ABNORMAL LOW (ref 10–24)
BUN: 10 mg/dL (ref 8–27)
Bilirubin Total: 0.4 mg/dL (ref 0.0–1.2)
CO2: 21 mmol/L (ref 20–29)
Calcium: 10.4 mg/dL — ABNORMAL HIGH (ref 8.6–10.2)
Chloride: 99 mmol/L (ref 96–106)
Creatinine, Ser: 1.55 mg/dL — ABNORMAL HIGH (ref 0.76–1.27)
Globulin, Total: 2.4 g/dL (ref 1.5–4.5)
Glucose: 81 mg/dL (ref 70–99)
Potassium: 3.4 mmol/L — ABNORMAL LOW (ref 3.5–5.2)
Sodium: 143 mmol/L (ref 134–144)
Total Protein: 7 g/dL (ref 6.0–8.5)
eGFR: 48 mL/min/{1.73_m2} — ABNORMAL LOW (ref >59)

## 2023-08-28 LAB — LIPID PANEL
Chol/HDL Ratio: 2.9 ratio (ref 0.0–5.0)
Cholesterol, Total: 122 mg/dL (ref 100–199)
HDL: 42 mg/dL (ref >39)
LDL Chol Calc (NIH): 50 mg/dL (ref 0–99)
Triglycerides: 184 mg/dL — ABNORMAL HIGH (ref 0–149)
VLDL Cholesterol Cal: 30 mg/dL (ref 5–40)

## 2023-08-28 LAB — CBC WITH DIFFERENTIAL/PLATELET
Basophils Absolute: 0.1 10*3/uL (ref 0.0–0.2)
Basos: 1 %
EOS (ABSOLUTE): 0.1 10*3/uL (ref 0.0–0.4)
Eos: 1 %
Hematocrit: 43.2 % (ref 37.5–51.0)
Hemoglobin: 13.2 g/dL (ref 13.0–17.7)
Immature Grans (Abs): 0 10*3/uL (ref 0.0–0.1)
Immature Granulocytes: 0 %
Lymphocytes Absolute: 1.7 10*3/uL (ref 0.7–3.1)
Lymphs: 19 %
MCH: 25.4 pg — ABNORMAL LOW (ref 26.6–33.0)
MCHC: 30.6 g/dL — ABNORMAL LOW (ref 31.5–35.7)
MCV: 83 fL (ref 79–97)
Monocytes Absolute: 1 10*3/uL — ABNORMAL HIGH (ref 0.1–0.9)
Monocytes: 12 %
Neutrophils Absolute: 5.8 10*3/uL (ref 1.4–7.0)
Neutrophils: 67 %
Platelets: 261 10*3/uL (ref 150–450)
RBC: 5.2 x10E6/uL (ref 4.14–5.80)
RDW: 16.3 % — ABNORMAL HIGH (ref 11.6–15.4)
WBC: 8.6 10*3/uL (ref 3.4–10.8)

## 2023-08-30 ENCOUNTER — Ambulatory Visit: Payer: Self-pay | Admitting: Cardiology

## 2023-08-30 ENCOUNTER — Ambulatory Visit: Payer: Self-pay | Admitting: Family Medicine

## 2023-08-30 NOTE — Assessment & Plan Note (Signed)
 Check labs

## 2023-08-30 NOTE — Assessment & Plan Note (Signed)
 Well controlled.  No changes to medicines. Fish oil 1 tablet twice daily. Zetia 10 mg daily.  Continue to work on eating a healthy diet and exercise.

## 2023-08-30 NOTE — Assessment & Plan Note (Signed)
 Continue protonix 40mg  daily.

## 2023-08-30 NOTE — Assessment & Plan Note (Signed)
Well controlled.  No changes to medicines. Continue on clonidine 0.3 mg once daily and Norvasc 2.5 mg twice daily.

## 2023-08-30 NOTE — Assessment & Plan Note (Signed)
 Trial on breztri  2 puffs twice daily. Specifically start prior to a weather change and then use airsupra  2 puffs four times a day as needed shortness of breath.  Samples given .  No true etiology found for his shortness of breath, despite an incredibly thorough work up (cardiology, neurology, pulmonology, allergy , and ENT.) Numerous of all specialist.

## 2023-08-31 ENCOUNTER — Other Ambulatory Visit: Payer: Self-pay

## 2023-08-31 DIAGNOSIS — I7121 Aneurysm of the ascending aorta, without rupture: Secondary | ICD-10-CM

## 2023-08-31 NOTE — Progress Notes (Signed)
 Joe Arroyo

## 2023-09-01 MED ORDER — MONTELUKAST SODIUM 10 MG PO TABS: 10.0000 mg | ORAL_TABLET | Freq: Every day | ORAL | 5 refills | Status: DC

## 2023-09-29 ENCOUNTER — Other Ambulatory Visit: Payer: Self-pay

## 2023-10-05 ENCOUNTER — Telehealth: Payer: Self-pay

## 2023-10-05 MED ORDER — AMLODIPINE BESYLATE 2.5 MG PO TABS: 2.5000 mg | ORAL_TABLET | Freq: Every day | ORAL | 1 refills | Status: DC

## 2023-10-05 NOTE — Telephone Encounter (Signed)
 Refill sent to pharmacy.

## 2023-10-08 ENCOUNTER — Other Ambulatory Visit: Payer: Self-pay

## 2023-10-08 MED ORDER — LEVOCETIRIZINE DIHYDROCHLORIDE 5 MG PO TABS: ORAL_TABLET | ORAL | 1 refills | Status: DC

## 2023-10-13 ENCOUNTER — Other Ambulatory Visit: Payer: Self-pay

## 2023-10-13 DIAGNOSIS — E782 Mixed hyperlipidemia: Secondary | ICD-10-CM

## 2023-10-13 MED ORDER — ROSUVASTATIN CALCIUM 40 MG PO TABS: 40.0000 mg | ORAL_TABLET | Freq: Every day | ORAL | 0 refills | Status: DC

## 2023-10-14 ENCOUNTER — Telehealth: Payer: Self-pay | Admitting: Cardiology

## 2023-10-14 MED ORDER — FUROSEMIDE 40 MG PO TABS: 40.0000 mg | ORAL_TABLET | Freq: Two times a day (BID) | ORAL | 3 refills | Status: AC

## 2023-10-14 NOTE — Telephone Encounter (Signed)
RX sent and pt aware.  

## 2023-10-14 NOTE — Telephone Encounter (Signed)
 Best phone number [phone]  Please send Furosemide  to [facility]-  Patient says pharmacy has tried to contact us  3x-  I informed patient we are working through a new process with refills, thanked him for his patience and told him someone would call when it was sent in.  Thank you!

## 2023-11-12 DIAGNOSIS — R0602 Shortness of breath: Secondary | ICD-10-CM | POA: Diagnosis not present

## 2023-11-12 DIAGNOSIS — G609 Hereditary and idiopathic neuropathy, unspecified: Secondary | ICD-10-CM | POA: Diagnosis not present

## 2023-11-17 ENCOUNTER — Ambulatory Visit (INDEPENDENT_AMBULATORY_CARE_PROVIDER_SITE_OTHER): Admitting: Family Medicine

## 2023-11-17 ENCOUNTER — Encounter: Payer: Medicare HMO | Admitting: Family Medicine

## 2023-11-17 ENCOUNTER — Encounter: Payer: Self-pay | Admitting: Family Medicine

## 2023-11-17 VITALS — Ht 73.0 in | Wt 196.0 lb

## 2023-11-17 DIAGNOSIS — Z0000 Encounter for general adult medical examination without abnormal findings: Secondary | ICD-10-CM | POA: Insufficient documentation

## 2023-11-17 HISTORY — DX: Encounter for general adult medical examination without abnormal findings: Z00.00

## 2023-11-17 NOTE — Assessment & Plan Note (Addendum)
 Up to date on screenings- colonoscopy was completed on 07/21/23, Dr. Larene will fax results as soon as their fax starts working again.   Things to do to keep yourself healthy  - Exercise at least 30-45 minutes a day, 3-4 days a week.  - Eat a low-fat diet with lots of fruits and vegetables, up to 7-9 servings per day.  - Seatbelts can save your life. Wear them always.  - Smoke detectors on every level of your home, check batteries every year.  - Eye Doctor - have an eye exam every 1-2 years  - Alcohol -  If you drink, do it moderately, less than 2 drinks per day.  - Health Care Power of Attorney. Choose someone to speak for you if you are not able.  - Depression is common in our stressful world.If you're feeling down or losing interest in things you normally enjoy, please come in for a visit.  - Violence - If anyone is threatening or hurting you, please call immediately.

## 2023-11-17 NOTE — Progress Notes (Signed)
 Subjective:   Joe Arroyo is a 68 y.o. male who presents for Medicare Annual/Subsequent preventive examination.  Visit Complete: Virtual I connected with  [name] on 11/17/23 by a audio enabled telemedicine application and verified that I am speaking with the correct person using two identifiers.  Patient Location: Home  Provider Location: Office/Clinic  I discussed the limitations of evaluation and management by telemedicine. The patient expressed understanding and agreed to proceed.  Vital Signs: Because this visit was a virtual/telehealth visit, some criteria may be missing or patient reported. Any vitals not documented were not able to be obtained and vitals that have been documented are patient reported.  Patient Medicare AWV questionnaire was completed by the patient on 8/; I have confirmed that all information answered by patient is correct and no changes since this date.  Cardiac Risk Factors include: advanced age (>82men, >52 women);hypertension;male gender     Objective:    Today's Vitals   11/17/23 1538  Weight: 196 lb (88.9 kg)  Height: 6' 1 (1.854 m)   Body mass index is 25.86 kg/m.     11/11/2022    2:45 PM 02/06/2022    4:26 PM 02/06/2022    1:10 AM 10/07/2021    8:24 AM 09/12/2021    8:48 AM 06/13/2021   10:38 AM 12/03/2020    2:23 PM  Advanced Directives  Does Patient Have a Medical Advance Directive? Yes No No Yes Yes Yes Yes  Type of Estate agent of [place];Living will   Healthcare Power of [place];Living will Healthcare Power of [employer] of [place];Living will Healthcare Power of [place];Living will  Does patient want to make changes to medical advance directive? No - Patient declined   No - Patient declined  No - Patient declined No - Patient declined  Copy of Healthcare Power of Attorney in Chart? Yes - validated most recent copy scanned in chart (See row information)     Yes - validated most recent copy  scanned in chart (See row information) No - copy requested  Would patient like information on creating a medical advance directive?  No - Patient declined         Current Medications (verified) Outpatient Encounter Medications as of 11/17/2023  Medication Sig   Albuterol-Budesonide (AIRSUPRA ) 90-80 MCG/ACT AERO Inhale 2 puffs into the lungs 4 (four) times daily as needed (shortness of breath).   amLODipine  (NORVASC ) 2.5 MG tablet Take 1 tablet (2.5 mg total) by mouth daily. for blood pressure   azelastine  (ASTELIN ) 0.1 % nasal spray 1-2 sprays in each nostril twice a day as needed   budesonide-glycopyrrolate-formoterol  (BREZTRI  AEROSPHERE) 160-9-4.8 MCG/ACT AERO inhaler Inhale 2 puffs into the lungs in the morning and at bedtime.   cloNIDine  (CATAPRES ) 0.3 MG tablet Take 1 tablet (0.3 mg total) by mouth daily.   clopidogrel  (PLAVIX ) 75 MG tablet Take 1 tablet (75 mg total) by mouth daily.   cyanocobalamin (VITAMIN B12) 1000 MCG tablet Take 1,000 mcg by mouth daily.   esomeprazole (NEXIUM) 10 MG packet Take 10 mg by mouth daily before breakfast.   ezetimibe  (ZETIA ) 10 MG tablet Take 1 tablet (10 mg total) by mouth daily.   finasteride (PROSCAR) 5 MG tablet Take 5 mg by mouth daily.   fluticasone  (FLONASE ) 50 MCG/ACT nasal spray 1-2 sprays in each nostril daily   furosemide  (LASIX ) 40 MG tablet Take 1 tablet (40 mg total) by mouth 2 (two) times daily.   ipratropium (ATROVENT ) 0.06 % nasal spray  1-2 sprays in each nostril up to 3 times a day as needed for runny nose/post nasal drip/drainage   levocetirizine (XYZAL ) 5 MG tablet Can take one tablet once daily as needed.   magnesium oxide (MAG-OX) 400 (240 Mg) MG tablet Take 400 mg by mouth daily.   montelukast  (SINGULAIR ) 10 MG tablet Take 1 tablet (10 mg total) by mouth at bedtime.   Multiple Vitamins-Minerals (CENTRUM SILVER ULTRA MENS PO) Take 1 tablet by mouth daily.   Omega-3 Fatty Acids (FISH OIL PO) Take 1 tablet by mouth 2 (two) times daily.    rosuvastatin  (CRESTOR ) 40 MG tablet Take 1 tablet (40 mg total) by mouth daily.   zinc gluconate 50 MG tablet Take 50 mg by mouth daily.   No facility-administered encounter medications on file as of 11/17/2023.    Allergies (verified) Nitroglycerin , Beta adrenergic blockers, Bystolic [nebivolol hcl], Carvedilol, Pravastatin, Tamsulosin, Hctz [hydrochlorothiazide], Hydralazine, and Penicillins   History: Past Medical History:  Diagnosis Date   Abnormal cardiac CT angiography    Atypical chest pain 01/07/2021   Chronic idiopathic constipation 01/07/2021   Coronary artery disease    Dyspnea on exertion 09/05/2019   Essential hypertension, benign 05/14/2018   GERD (gastroesophageal reflux disease) 09/28/2020   Hiatal hernia 09/28/2020   History of hiatal hernia    History of kidney stones    Hyperlipidemia 05/14/2018   Hypertensive heart disease 05/14/2018   09/17/2021: SUMMARY  Severe single-vessel disease as indicated by Coronary CTA with RPL 2 having tandem 90% and 80% stenoses (CT FFR positive)-this vessel is at least the same size as the LAD  Successful DES PCI of RPL 2 covering both lesions with a Synergy DES 2.25 mm x 20 mm postdilated in tapered fashion from 2.5 to 2.3 mm.)  Otherwise minimal CAD with relatively small caliber left coronary syst   Neuropathy 05/2023   Non-seasonal allergic rhinitis due to pollen 08/16/2019   OSA (obstructive sleep apnea) 09/28/2020   Other chronic sinusitis 05/02/2022   Other fatigue 03/17/2022   Other seasonal allergic rhinitis 05/02/2022   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   Shortness of breath 09/05/2019   Sinus bradycardia 05/14/2018   Syncope 09/05/2019   Thoracic ascending aortic aneurysm (HCC)    ascending TAA 4.6 x 4.4 cm 10/03/20 CTA chest   Past Surgical History:  Procedure Laterality Date   COLONOSCOPY     CORONARY STENT INTERVENTION N/A 10/07/2021   Procedure: CORONARY STENT INTERVENTION;  Surgeon: Anner Alm ORN, MD;   Location: [facility] INVASIVE CV LAB;  Service: Cardiovascular;  Laterality: N/A;   ESOPHAGOGASTRODUODENOSCOPY N/A 12/05/2020   Procedure: ESOPHAGOGASTRODUODENOSCOPY (EGD);  Surgeon: Shyrl Linnie KIDD, MD;  Location: [facility] OR;  Service: Thoracic;  Laterality: N/A;   LAMINECTOMY     MOLE REMOVAL     PROSTATE BIOPSY     RIGHT/LEFT HEART CATH AND CORONARY ANGIOGRAPHY N/A 10/07/2021   Procedure: RIGHT/LEFT HEART CATH AND CORONARY ANGIOGRAPHY;  Surgeon: Anner Alm ORN, MD;  Location: [facility] INVASIVE CV LAB;  Service: Cardiovascular;  Laterality: N/A;   TONSILLECTOMY AND ADENOIDECTOMY     UPPER GASTROINTESTINAL ENDOSCOPY     XI ROBOTIC ASSISTED PARAESOPHAGEAL HERNIA REPAIR N/A 12/05/2020   Procedure: XI ROBOTIC ASSISTED PARAESOPHAGEAL HERNIA REPAIR AND FUNDOPLICATION;  Surgeon: Shyrl Linnie KIDD, MD;  Location: [facility] OR;  Service: Thoracic;  Laterality: N/A;   Family History  Problem Relation Age of Onset   Hyperlipidemia Mother    Aortic stenosis Mother    Valvular heart disease Mother    Colon  cancer Mother    Diabetes Mellitus II Mother    Cancer - Colon Mother    Cancer Mother    Stroke Mother    Cerebrovascular Accident Mother    Diabetes Mother    Hypertension Mother    Hyperlipidemia Father    Diabetes Mellitus II Father    Diabetes Father    Alzheimer's disease Father    Esophageal cancer Paternal Uncle    CAD Maternal Grandmother    Congestive Heart Failure Maternal Grandmother    Heart attack Maternal Grandmother    Heart failure Maternal Grandmother    Coronary artery disease Maternal Grandmother    Hyperlipidemia Maternal Grandmother    Social History   Socioeconomic History   Marital status: Single    Spouse name: Not on file   Number of children: Not on file   Years of education: Not on file   Highest education level: Not on file  Occupational History   Not on file  Tobacco Use   Smoking status: Never   Smokeless tobacco: Never  Vaping Use   Vaping status: Never Used   Substance and Sexual Activity   Alcohol use: Yes    Comment: occasional   Drug use: Never   Sexual activity: Not on file  Other Topics Concern   Not on file  Social History Narrative   Not on file   Social Drivers of Health   Financial Resource Strain: Low Risk  (11/11/2022)   Overall Financial Resource Strain (CARDIA)    Difficulty of Paying Living Expenses: Not hard at all  Food Insecurity: No Food Insecurity (11/11/2022)   Hunger Vital Sign    Worried About Running Out of Food in the Last Year: Never true    Ran Out of Food in the Last Year: Never true  Transportation Needs: No Transportation Needs (11/11/2022)   PRAPARE - Administrator, Civil Service (Medical): No    Lack of Transportation (Non-Medical): No  Physical Activity: Sufficiently Active (11/11/2022)   Exercise Vital Sign    Days of Exercise per Week: 5 days    Minutes of Exercise per Session: 30 min  Stress: No Stress Concern Present (11/11/2022)   [employer] of Occupational Health - Occupational Stress Questionnaire    Feeling of Stress : Not at all  Social Connections: Socially Isolated (11/11/2022)   Social Connection and Isolation Panel    Frequency of Communication with Friends and Family: More than three times a week    Frequency of Social Gatherings with Friends and Family: More than three times a week    Attends Religious Services: Never    Database administrator or Organizations: No    Attends Engineer, structural: Never    Marital Status: Never married    Tobacco Counseling Counseling given: Not Answered   Clinical Intake:  Pre-visit preparation completed: No  Pain : No/denies pain     BMI - recorded: 25.86 Nutritional Status: BMI 25 -29 Overweight Diabetes: No  How often do you need to have someone help you when you read instructions, pamphlets, or other written materials from your doctor or pharmacy?: 1 - Never What is the last grade level you completed in  school?: COLLEGE  Interpreter Needed?: No      Activities of Daily Living    11/17/2023    2:45 PM  In your present state of health, do you have any difficulty performing the following activities:  Hearing? 0  Vision? 0  Difficulty concentrating or making decisions? 0  Walking or climbing stairs? 1  Dressing or bathing? 0  Doing errands, shopping? 0  Preparing Food and eating ? N  Using the Toilet? N  In the past six months, have you accidently leaked urine? N  Do you have problems with loss of bowel control? N  Managing your Medications? N  Managing your Finances? N  Housekeeping or managing your Housekeeping? N    Patient Care Team: Sherre Clapper, MD as PCP - General (Internal Medicine) Monetta Redell PARAS, MD as PCP - Cardiology (Cardiology) Sherre Clapper, MD as Referring Physician (Internal Medicine) Monetta Redell PARAS, MD as Consulting Physician (Cardiology) Kara Dorn NOVAK, MD as Consulting Physician (Pulmonary Disease) Marda General, MD as Consulting Physician (Urology)  Indicate any recent Medical Services you may have received from other than [facility] providers in the past year (date may be approximate).     Assessment:   This is a routine wellness examination for Jimmie.  Hearing/Vision screen No results found.   Goals Addressed               This Visit's Progress     Patient Stated   On track     Get answers and improvement for [facility] that he has had over the past 14 months  Has seen 3 different pulmonologist in [place], [place], and [place].  He states that no one has an answer for him and that it is worse when it rains.       Stay Healthy (pt-stated)   On track     Aim to do some physical activity for 150 minutes per week. This is typically divided into 5 days per week, 30 minutes per day. The activity should be enough to get your heart rate up. Anything is better than nothing if you have time constraints.         Depression Screen     11/17/2023    2:51 PM 08/27/2023    9:02 AM 05/26/2023    9:59 AM 12/23/2022   11:37 AM 11/11/2022    2:44 PM 09/16/2022    9:23 AM 09/12/2021    8:45 AM  PHQ 2/9 Scores  PHQ - 2 Score 0 0 0 1 0 0 0  PHQ- 9 Score    2 0 1     Fall Risk    11/17/2023    2:49 PM 08/27/2023    9:02 AM 11/11/2022    2:45 PM 09/16/2022    9:22 AM 03/17/2022    9:13 AM  Fall Risk   Falls in the past year? 0 0 0 0 0  Number falls in past yr: 0 0 0 0 0  Injury with Fall? 0 0 0 0 0  Risk for fall due to : No Fall Risks No Fall Risks No Fall Risks No Fall Risks No Fall Risks  Follow up Falls evaluation completed Falls evaluation completed Falls prevention discussed Falls evaluation completed;Falls prevention discussed Falls evaluation completed      Data saved with a previous flowsheet row definition    MEDICARE RISK AT HOME: Medicare Risk at Home Any stairs in or around the home?: Yes (ONLY TWO STEPS) If so, are there any without handrails?: No Home free of loose throw rugs in walkways, pet beds, electrical cords, etc?: Yes Adequate lighting in your home to reduce risk of falls?: Yes Life alert?: No Use of a cane, walker or w/c?: No Grab bars in the bathroom?: No  Shower chair or bench in shower?: No Elevated toilet seat or a handicapped toilet?: No  TIMED UP AND GO:  Was the test performed?  No    Cognitive Function:        11/17/2023    2:52 PM 11/11/2022    2:45 PM 09/12/2021    8:46 AM 06/13/2021   10:40 AM  6CIT Screen  What Year? 0 points 0 points 0 points 0 points  What month? 0 points 0 points 0 points 0 points  What time? 0 points 0 points 0 points 0 points  Count back from 20 0 points 0 points 0 points 0 points  Months in reverse 0 points 0 points 0 points 0 points  Repeat phrase 0 points 0 points 0 points 0 points  Total Score 0 points 0 points 0 points 0 points    Immunizations Immunization History  Administered Date(s) Administered   Fluad Quad(high Dose 65+) 12/27/2019, 01/03/2021,  03/17/2022   Fluad Trivalent(High Dose 65+) 12/23/2022   Hepatitis B, ADULT 05/24/2016, 07/01/2016, 11/25/2016   Influenza,inj,Quad PF,6+ Mos 12/22/2017   Moderna Covid-19 Vaccine Bivalent Booster 93yrs & up 04/01/2021   Moderna Sars-Covid-2 Vaccination 05/23/2019, 06/20/2019, 04/27/2020   PNEUMOCOCCAL CONJUGATE-20 09/12/2021   Pneumococcal Polysaccharide-23 09/03/2010   Tdap 12/22/2017   Zoster Recombinant(Shingrix) 06/29/2018, 09/25/2020    TDAP status: Up to date  Flu Vaccine status: Due, Education has been provided regarding the importance of this vaccine. Advised may receive this vaccine at local pharmacy or Health Dept. Aware to provide a copy of the vaccination record if obtained from local pharmacy or Health Dept. Verbalized acceptance and understanding.  Pneumococcal vaccine status: Up to date  Covid-19 vaccine status: Declined, Education has been provided regarding the importance of this vaccine but patient still declined. Advised may receive this vaccine at local pharmacy or Health Dept.or vaccine clinic. Aware to provide a copy of the vaccination record if obtained from local pharmacy or Health Dept. Verbalized acceptance and understanding.  Qualifies for Shingles Vaccine? Yes   Zostavax completed No   Shingrix Completed?: Yes  Screening Tests Health Maintenance  Topic Date Due   Hepatitis C Screening  Never done   Colonoscopy  02/10/2023   INFLUENZA VACCINE  11/13/2023   Medicare Annual Wellness (AWV)  11/16/2024   DTaP/Tdap/Td (2 - Td or Tdap) 12/23/2027   Pneumococcal Vaccine: 50+ Years  Completed   Hepatitis B Vaccines  Completed   Zoster Vaccines- Shingrix  Completed   HPV VACCINES  Aged Out   Meningococcal B Vaccine  Aged Out   COVID-19 Vaccine  Discontinued    Health Maintenance  Health Maintenance Due  Topic Date Due   Hepatitis C Screening  Never done   Colonoscopy  02/10/2023   INFLUENZA VACCINE  11/13/2023    Colorectal cancer screening: Type of  screening: Colonoscopy. Completed 02/09/2018. Repeat every 05 years Need to schedule.  Lung Cancer Screening: (Low Dose CT Chest recommended if Age 59-80 years, 20 pack-year currently smoking OR have quit w/in 15years.) does not qualify.   Lung Cancer Screening Referral: N/A  Additional Screening:  Hepatitis C Screening: does qualify; Completed N/A  Vision Screening: Recommended annual ophthalmology exams for early detection of glaucoma and other disorders of the eye. Is the patient up to date with their annual eye exam?  Yes  Who is the provider or what is the name of the office in which the patient attends annual eye exams? Dr. Henry   If pt is not established  with a provider, would they like to be referred to a provider to establish care? N/A.   Dental Screening: Recommended annual dental exams for proper oral hygiene  Diabetic Foot Exam: N/A  Community Resource Referral / Chronic Care Management: CRR required this visit?  No   CCM required this visit?  No     Plan:    Encounter for Medicare annual wellness exam Assessment & Plan: Up to date on screenings- colonoscopy was completed on 07/21/23, Dr. Larene will fax results as soon as their fax starts working again.   Things to do to keep yourself healthy  - Exercise at least 30-45 minutes a day, 3-4 days a week.  - Eat a low-fat diet with lots of fruits and vegetables, up to 7-9 servings per day.  - Seatbelts can save your life. Wear them always.  - Smoke detectors on every level of your home, check batteries every year.  - Eye Doctor - have an eye exam every 1-2 years  - Alcohol -  If you drink, do it moderately, less than 2 drinks per day.  - Health Care Power of Attorney. Choose someone to speak for you if you are not able.  - Depression is common in our stressful world.If you're feeling down or losing interest in things you normally enjoy, please come in for a visit.  - Violence - If anyone is threatening or  hurting you, please call immediately.       I have personally reviewed and noted the following in the patient's chart:   Medical and social history Use of alcohol, tobacco or illicit drugs  Current medications and supplements including opioid prescriptions. Patient is not currently taking opioid prescriptions. Functional ability and status Nutritional status Physical activity Advanced directives List of other physicians Hospitalizations, surgeries, and ER visits in previous 12 months Vitals Screenings to include cognitive, depression, and falls Referrals and appointments  In addition, I have reviewed and discussed with patient certain preventive protocols, quality metrics, and best practice recommendations. A written personalized care plan for preventive services as well as general preventive health recommendations were provided to patient.    Harrie Cedar, FNP [facility] [phone]    11/17/2023   After Visit Summary: (Declined) Due to this being a telephonic visit, with patients personalized plan was offered to patient but patient Declined AVS at this time

## 2023-12-09 ENCOUNTER — Ambulatory Visit: Admitting: Family Medicine

## 2023-12-09 ENCOUNTER — Encounter: Payer: Self-pay | Admitting: Family Medicine

## 2023-12-09 VITALS — BP 112/80 | HR 92 | Temp 98.1°F | Ht 73.0 in | Wt 195.0 lb

## 2023-12-09 DIAGNOSIS — R0602 Shortness of breath: Secondary | ICD-10-CM | POA: Diagnosis not present

## 2023-12-09 DIAGNOSIS — G4733 Obstructive sleep apnea (adult) (pediatric): Secondary | ICD-10-CM | POA: Diagnosis not present

## 2023-12-09 DIAGNOSIS — I119 Hypertensive heart disease without heart failure: Secondary | ICD-10-CM

## 2023-12-09 DIAGNOSIS — E782 Mixed hyperlipidemia: Secondary | ICD-10-CM | POA: Diagnosis not present

## 2023-12-09 DIAGNOSIS — R0789 Other chest pain: Secondary | ICD-10-CM | POA: Diagnosis not present

## 2023-12-10 ENCOUNTER — Ambulatory Visit: Payer: Self-pay | Admitting: Family Medicine

## 2023-12-10 DIAGNOSIS — E876 Hypokalemia: Secondary | ICD-10-CM

## 2023-12-10 LAB — CBC WITH DIFFERENTIAL/PLATELET
Basophils Absolute: 0.1 x10E3/uL (ref 0.0–0.2)
Basos: 1 %
EOS (ABSOLUTE): 0.1 x10E3/uL (ref 0.0–0.4)
Eos: 1 %
Hematocrit: 42.1 % (ref 37.5–51.0)
Hemoglobin: 13.2 g/dL (ref 13.0–17.7)
Immature Grans (Abs): 0 x10E3/uL (ref 0.0–0.1)
Immature Granulocytes: 0 %
Lymphocytes Absolute: 1.7 x10E3/uL (ref 0.7–3.1)
Lymphs: 18 %
MCH: 26.1 pg — ABNORMAL LOW (ref 26.6–33.0)
MCHC: 31.4 g/dL — ABNORMAL LOW (ref 31.5–35.7)
MCV: 83 fL (ref 79–97)
Monocytes Absolute: 1 x10E3/uL — ABNORMAL HIGH (ref 0.1–0.9)
Monocytes: 11 %
Neutrophils Absolute: 6.3 x10E3/uL (ref 1.4–7.0)
Neutrophils: 69 %
Platelets: 270 x10E3/uL (ref 150–450)
RBC: 5.05 x10E6/uL (ref 4.14–5.80)
RDW: 16.4 % — ABNORMAL HIGH (ref 11.6–15.4)
WBC: 9.2 x10E3/uL (ref 3.4–10.8)

## 2023-12-10 LAB — COMPREHENSIVE METABOLIC PANEL WITH GFR
ALT: 20 IU/L (ref 0–44)
AST: 32 IU/L (ref 0–40)
Albumin: 4.4 g/dL (ref 3.9–4.9)
Alkaline Phosphatase: 122 IU/L — ABNORMAL HIGH (ref 44–121)
BUN/Creatinine Ratio: 9 — ABNORMAL LOW (ref 10–24)
BUN: 15 mg/dL (ref 8–27)
Bilirubin Total: 0.4 mg/dL (ref 0.0–1.2)
CO2: 22 mmol/L (ref 20–29)
Calcium: 9.7 mg/dL (ref 8.6–10.2)
Chloride: 97 mmol/L (ref 96–106)
Creatinine, Ser: 1.67 mg/dL — ABNORMAL HIGH (ref 0.76–1.27)
Globulin, Total: 2.7 g/dL (ref 1.5–4.5)
Glucose: 83 mg/dL (ref 70–99)
Potassium: 2.9 mmol/L — ABNORMAL LOW (ref 3.5–5.2)
Sodium: 141 mmol/L (ref 134–144)
Total Protein: 7.1 g/dL (ref 6.0–8.5)
eGFR: 44 mL/min/1.73 — ABNORMAL LOW (ref >59)

## 2023-12-10 LAB — LIPID PANEL
Chol/HDL Ratio: 2.8 ratio (ref 0.0–5.0)
Cholesterol, Total: 110 mg/dL (ref 100–199)
HDL: 39 mg/dL — ABNORMAL LOW (ref >39)
LDL Chol Calc (NIH): 37 mg/dL (ref 0–99)
Triglycerides: 213 mg/dL — ABNORMAL HIGH (ref 0–149)
VLDL Cholesterol Cal: 34 mg/dL (ref 5–40)

## 2023-12-10 MED ORDER — POTASSIUM CHLORIDE CRYS ER 20 MEQ PO TBCR: 20.0000 meq | EXTENDED_RELEASE_TABLET | Freq: Every day | ORAL | 0 refills | Status: DC

## 2023-12-15 DIAGNOSIS — R339 Retention of urine, unspecified: Secondary | ICD-10-CM | POA: Diagnosis not present

## 2023-12-15 DIAGNOSIS — Z87442 Personal history of urinary calculi: Secondary | ICD-10-CM | POA: Diagnosis not present

## 2023-12-15 DIAGNOSIS — C61 Malignant neoplasm of prostate: Secondary | ICD-10-CM | POA: Diagnosis not present

## 2023-12-17 ENCOUNTER — Encounter: Payer: Self-pay | Admitting: Family Medicine

## 2023-12-17 NOTE — Assessment & Plan Note (Signed)
 EKG NSR. No ST changes.  Atypical for cardiac, pulmonary, gerd, or anxiety.

## 2023-12-17 NOTE — Assessment & Plan Note (Signed)
 Well controlled.  No changes to medicines. Fish oil 1 tablet twice daily. Zetia 10 mg daily.  Continue to work on eating a healthy diet and exercise.

## 2023-12-17 NOTE — Assessment & Plan Note (Signed)
Well controlled.  No changes to medicines. Continue on clonidine 0.3 mg once daily and Norvasc 2.5 mg twice daily.

## 2023-12-17 NOTE — Assessment & Plan Note (Addendum)
 Normal 6 minute walk test in terms of oxygenation although became more dyspneic and tachycardic.  Initially thought to be due to CORONARY ARTERY DISEASE, but stent placed and symptoms continued to worsen. Then thought was due to paraesophageal hernia, but was repaired and continued and in fact worsening dyspnea.  Has had pulmonary, allergy , ent, and neurology workups all negative.  Requests [facility] PHARMACIST to review his medications to see if anything could be contributing to dyspnea.

## 2023-12-17 NOTE — Assessment & Plan Note (Signed)
 Order home sleep study to reassess oxygenation overnight.

## 2023-12-17 NOTE — Progress Notes (Signed)
 Subjective:  Patient ID: Joe Arroyo, male    DOB: May 10, 1955  Age: 68 y.o. MRN: [id number]  Chief Complaint  Patient presents with   Medical Management of Chronic Issues    C/o shob- walk test today O2 sitting 98%. During 6 minute walk test O2 went from 98-90. Never dropped below 90.    HPI: Discussed the use of AI scribe software for clinical note transcription with the patient, who gave verbal consent to proceed.  History of Present Illness   Joe Arroyo is a 68 year old male who presents with worsening shortness of breath.  Dyspnea - Worsening shortness of breath over the past two months - Symptoms unrelieved by multiple inhalers, including Breztri  and Airsupra  - Minimal relief with Airsupra  when used alone - Symptoms worsen with movement and talking, remain consistent throughout the day - Exacerbated by hot weather, slightly improved in cooler weather - Able to take deep breaths without pain, but experiences a sensation of pressure on the chest - No improvement with current medications - No significant swelling since starting Lasix  - Oxygen levels have been stable - No attribution of symptoms to anxiety or nervousness - History of mild sleep apnea diagnosed in 2021; does not use CPAP due to claustrophobia - Hospitalized two years ago for a breathing attack, possibly received oxygen without relief - Pulmonology and cardiology evaluations have not identified cardiac or pulmonary etiology - ENT and neurology evaluations diagnosed early neuropathy, but no connection to breathing issues found - CT scan in May 2025 showed annual scarring in the lungs - No EKG since last visit  Chest discomfort - Constant soreness across the chest, radiating down both arms - Soreness has worsened over the last two months - Described as 'sore' rather than numb or burning - Not relieved by any medication - Does not extend to the back  Medication intolerance and side effects - Current  medications: amlodipine , clonidine  (for blood pressure), baby aspirin  (for heart health), Xyzal  and Singulair  (for allergies), over-the-counter Nexium (for acid reflux), B12, rosuvastatin , Zetia  (for cholesterol), Lasix  (for fluid retention) - Discontinued Plavix  due to easy bruising, with cardiology approval - Does not use azelastine  or Flonase  nasal sprays as allergy  pills suffice - Sensation of 'floating' when using both Breztri  and Airsupra  inhalers together, leading to discontinuation of Breztri            11/17/2023    2:51 PM 08/27/2023    9:02 AM 05/26/2023    9:59 AM 12/23/2022   11:37 AM 11/11/2022    2:44 PM  Depression screen PHQ 2/9  Decreased Interest 0 0 0 1 0  Down, Depressed, Hopeless 0 0 0 0 0  PHQ - 2 Score 0 0 0 1 0  Altered sleeping    0 0  Tired, decreased energy    1 0  Change in appetite    0 0  Feeling bad or failure about yourself     0 0  Trouble concentrating    0 0  Moving slowly or fidgety/restless    0 0  Suicidal thoughts    0 0  PHQ-9 Score    2 0  Difficult doing work/chores    Somewhat difficult Not difficult at all        11/17/2023    2:49 PM  Fall Risk   Falls in the past year? 0  Number falls in past yr: 0  Injury with Fall? 0  Risk for fall due to : No  Fall Risks  Follow up Falls evaluation completed    Patient Care Team: Sherre Clapper, MD as PCP - General (Internal Medicine) Monetta Redell PARAS, MD as PCP - Cardiology (Cardiology) Sherre Clapper, MD as Referring Physician (Internal Medicine) Monetta Redell PARAS, MD as Consulting Physician (Cardiology) Kara Dorn NOVAK, MD as Consulting Physician (Pulmonary Disease) Marda General, MD as Consulting Physician (Urology)   Review of Systems  Constitutional:  Positive for fatigue. Negative for chills and fever.  HENT:  Negative for congestion, ear pain and sore throat.   Respiratory:  Positive for shortness of breath. Negative for cough.   Cardiovascular:  Positive for chest pain.   Gastrointestinal:  Negative for abdominal pain, constipation, diarrhea, nausea and vomiting.  Endocrine: Negative for polydipsia, polyphagia and polyuria.  Genitourinary:  Negative for dysuria and frequency.  Musculoskeletal:  Negative for arthralgias and myalgias.  Neurological:  Negative for dizziness and headaches.  Psychiatric/Behavioral:  Negative for dysphoric mood. The patient is not nervous/anxious.        No dysphoria    Current Outpatient Medications on File Prior to Visit  Medication Sig Dispense Refill   Albuterol-Budesonide (AIRSUPRA ) 90-80 MCG/ACT AERO Inhale 2 puffs into the lungs 4 (four) times daily as needed (shortness of breath).     amLODipine  (NORVASC ) 2.5 MG tablet Take 1 tablet (2.5 mg total) by mouth daily. for blood pressure 90 tablet 1   azelastine  (ASTELIN ) 0.1 % nasal spray 1-2 sprays in each nostril twice a day as needed 30 mL 5   cloNIDine  (CATAPRES ) 0.3 MG tablet Take 1 tablet (0.3 mg total) by mouth daily. 90 tablet 1   cyanocobalamin (VITAMIN B12) 1000 MCG tablet Take 1,000 mcg by mouth daily.     esomeprazole (NEXIUM) 10 MG packet Take 10 mg by mouth daily before breakfast.     ezetimibe  (ZETIA ) 10 MG tablet Take 1 tablet (10 mg total) by mouth daily. 90 tablet 1   finasteride (PROSCAR) 5 MG tablet Take 5 mg by mouth daily.     fluticasone  (FLONASE ) 50 MCG/ACT nasal spray 1-2 sprays in each nostril daily 16 g 5   furosemide  (LASIX ) 40 MG tablet Take 1 tablet (40 mg total) by mouth 2 (two) times daily. 180 tablet 3   ipratropium (ATROVENT ) 0.06 % nasal spray 1-2 sprays in each nostril up to 3 times a day as needed for runny nose/post nasal drip/drainage 15 mL 5   levocetirizine (XYZAL ) 5 MG tablet Can take one tablet once daily as needed. 90 tablet 1   magnesium oxide (MAG-OX) 400 (240 Mg) MG tablet Take 400 mg by mouth daily.     montelukast  (SINGULAIR ) 10 MG tablet Take 1 tablet (10 mg total) by mouth at bedtime. 30 tablet 5   Multiple Vitamins-Minerals  (CENTRUM SILVER ULTRA MENS PO) Take 1 tablet by mouth daily.     Omega-3 Fatty Acids (FISH OIL PO) Take 1 tablet by mouth 2 (two) times daily.     rosuvastatin  (CRESTOR ) 40 MG tablet Take 1 tablet (40 mg total) by mouth daily. 90 tablet 0   zinc gluconate 50 MG tablet Take 50 mg by mouth daily.     No current facility-administered medications on file prior to visit.   Past Medical History:  Diagnosis Date   Abnormal cardiac CT angiography    Atypical chest pain 01/07/2021   Chronic idiopathic constipation 01/07/2021   Coronary artery disease    Dyspnea on exertion 09/05/2019   Essential hypertension, benign 05/14/2018   GERD (  gastroesophageal reflux disease) 09/28/2020   Hiatal hernia 09/28/2020   History of hiatal hernia    History of kidney stones    Hyperlipidemia 05/14/2018   Hypertensive heart disease 05/14/2018   09/17/2021: SUMMARY  Severe single-vessel disease as indicated by Coronary CTA with RPL 2 having tandem 90% and 80% stenoses (CT FFR positive)-this vessel is at least the same size as the LAD  Successful DES PCI of RPL 2 covering both lesions with a Synergy DES 2.25 mm x 20 mm postdilated in tapered fashion from 2.5 to 2.3 mm.)  Otherwise minimal CAD with relatively small caliber left coronary syst   Neuropathy 05/2023   Non-seasonal allergic rhinitis due to pollen 08/16/2019   OSA (obstructive sleep apnea) 09/28/2020   Other chronic sinusitis 05/02/2022   Other fatigue 03/17/2022   Other seasonal allergic rhinitis 05/02/2022   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   Resolved with surgery   Shortness of breath 09/05/2019   Sinus bradycardia 05/14/2018   Syncope 09/05/2019   Thoracic ascending aortic aneurysm (HCC)    ascending TAA 4.6 x 4.4 cm 10/03/20 CTA chest   Past Surgical History:  Procedure Laterality Date   COLONOSCOPY     CORONARY STENT INTERVENTION N/A 10/07/2021   Procedure: CORONARY STENT INTERVENTION;  Surgeon: Anner Alm ORN, MD;  Location: [facility] INVASIVE  CV LAB;  Service: Cardiovascular;  Laterality: N/A;   ESOPHAGOGASTRODUODENOSCOPY N/A 12/05/2020   Procedure: ESOPHAGOGASTRODUODENOSCOPY (EGD);  Surgeon: Shyrl Linnie KIDD, MD;  Location: [facility] OR;  Service: Thoracic;  Laterality: N/A;   LAMINECTOMY     MOLE REMOVAL     PROSTATE BIOPSY     RIGHT/LEFT HEART CATH AND CORONARY ANGIOGRAPHY N/A 10/07/2021   Procedure: RIGHT/LEFT HEART CATH AND CORONARY ANGIOGRAPHY;  Surgeon: Anner Alm ORN, MD;  Location: [facility] INVASIVE CV LAB;  Service: Cardiovascular;  Laterality: N/A;   TONSILLECTOMY AND ADENOIDECTOMY     UPPER GASTROINTESTINAL ENDOSCOPY     XI ROBOTIC ASSISTED PARAESOPHAGEAL HERNIA REPAIR N/A 12/05/2020   Procedure: XI ROBOTIC ASSISTED PARAESOPHAGEAL HERNIA REPAIR AND FUNDOPLICATION;  Surgeon: Shyrl Linnie KIDD, MD;  Location: [facility] OR;  Service: Thoracic;  Laterality: N/A;    Family History  Problem Relation Age of Onset   Hyperlipidemia Mother    Aortic stenosis Mother    Valvular heart disease Mother    Colon cancer Mother    Diabetes Mellitus II Mother    Cancer - Colon Mother    Cancer Mother    Stroke Mother    Cerebrovascular Accident Mother    Diabetes Mother    Hypertension Mother    Hyperlipidemia Father    Diabetes Mellitus II Father    Diabetes Father    Alzheimer's disease Father    Esophageal cancer Paternal Uncle    CAD Maternal Grandmother    Congestive Heart Failure Maternal Grandmother    Heart attack Maternal Grandmother    Heart failure Maternal Grandmother    Coronary artery disease Maternal Grandmother    Hyperlipidemia Maternal Grandmother    Social History   Socioeconomic History   Marital status: Single    Spouse name: Not on file   Number of children: Not on file   Years of education: Not on file   Highest education level: Not on file  Occupational History   Not on file  Tobacco Use   Smoking status: Never   Smokeless tobacco: Never  Vaping Use   Vaping status: Never Used  Substance and  Sexual Activity   Alcohol use: Yes  Comment: occasional   Drug use: Never   Sexual activity: Not on file  Other Topics Concern   Not on file  Social History Narrative   Not on file   Social Drivers of Health   Financial Resource Strain: Low Risk  (11/11/2022)   Overall Financial Resource Strain (CARDIA)    Difficulty of Paying Living Expenses: Not hard at all  Food Insecurity: No Food Insecurity (12/09/2023)   Hunger Vital Sign    Worried About Running Out of Food in the Last Year: Never true    Ran Out of Food in the Last Year: Never true  Transportation Needs: No Transportation Needs (12/09/2023)   PRAPARE - Administrator, Civil Service (Medical): No    Lack of Transportation (Non-Medical): No  Physical Activity: Sufficiently Active (11/11/2022)   Exercise Vital Sign    Days of Exercise per Week: 5 days    Minutes of Exercise per Session: 30 min  Stress: No Stress Concern Present (11/11/2022)   [employer] of Occupational Health - Occupational Stress Questionnaire    Feeling of Stress : Not at all  Social Connections: Socially Isolated (11/11/2022)   Social Connection and Isolation Panel    Frequency of Communication with Friends and Family: More than three times a week    Frequency of Social Gatherings with Friends and Family: More than three times a week    Attends Religious Services: Never    Database administrator or Organizations: No    Attends Banker Meetings: Never    Marital Status: Never married    Objective:  BP 112/80   Pulse 92   Temp 98.1 F (36.7 C)   Ht 6' 1 (1.854 m)   Wt 195 lb (88.5 kg)   SpO2 97%   BMI 25.73 kg/m      12/09/2023   10:04 AM 11/17/2023    3:38 PM 08/27/2023    8:59 AM  BP/Weight  Systolic BP 112  863  Diastolic BP 80  76  Wt. (Lbs) 195 196 196  BMI 25.73 kg/m2 25.86 kg/m2 25.86 kg/m2    Physical Exam Vitals reviewed.  Constitutional:      General: He is in acute distress.  Neck:      Vascular: No carotid bruit.  Cardiovascular:     Rate and Rhythm: Regular rhythm. Tachycardia present.     Heart sounds: Normal heart sounds.     Comments: Tachycardia occurs with exertion. Pulmonary:     Effort: Respiratory distress (dyspnea with speech and with exertion.) present.     Breath sounds: Normal breath sounds. No wheezing, rhonchi or rales.  Abdominal:     General: Bowel sounds are normal.     Palpations: Abdomen is soft.     Tenderness: There is no abdominal tenderness.  Neurological:     Mental Status: He is alert and oriented to person, place, and time.  Psychiatric:        Mood and Affect: Mood normal.        Behavior: Behavior normal.         Lab Results  Component Value Date   WBC 9.2 12/09/2023   HGB 13.2 12/09/2023   HCT 42.1 12/09/2023   PLT 270 12/09/2023   GLUCOSE 83 12/09/2023   CHOL 110 12/09/2023   TRIG 213 (H) 12/09/2023   HDL 39 (L) 12/09/2023   LDLCALC 37 12/09/2023   ALT 20 12/09/2023   AST 32 12/09/2023   NA 141  12/09/2023   K 2.9 (L) 12/09/2023   CL 97 12/09/2023   CREATININE 1.67 (H) 12/09/2023   BUN 15 12/09/2023   CO2 22 12/09/2023   TSH 2.710 05/26/2023   INR 1.0 12/03/2020      Assessment & Plan:  SOB (shortness of breath) Assessment & Plan: Normal 6 minute walk test in terms of oxygenation although became more dyspneic and tachycardic.  Initially thought to be due to CORONARY ARTERY DISEASE, but stent placed and symptoms continued to worsen. Then thought was due to paraesophageal hernia, but was repaired and continued and in fact worsening dyspnea.  Has had pulmonary, allergy , ent, and neurology workups all negative.  Requests [facility] PHARMACIST to review his medications to see if anything could be contributing to dyspnea.    Mixed hyperlipidemia Assessment & Plan: Well controlled.  No changes to medicines. Fish oil 1 tablet twice daily. Zetia  10 mg daily.  Continue to work on eating a healthy diet and exercise.      Orders: -     Lipid panel  Hypertensive heart disease without heart failure Assessment & Plan: Well controlled.  No changes to medicines. Continue on clonidine  0.3 mg once daily and Norvasc  2.5 mg twice daily.      Orders: -     Comprehensive metabolic panel with GFR -     CBC with Differential/Platelet  Other chest pain Assessment & Plan: EKG NSR. No ST changes.  Atypical for cardiac, pulmonary, gerd, or anxiety.   Orders: -     EKG 12-Lead  OSA (obstructive sleep apnea) Assessment & Plan: Order home sleep study to reassess oxygenation overnight.   Orders: -     Home sleep test; Future    No orders of the defined types were placed in this encounter.   Orders Placed This Encounter  Procedures   Lipid panel   Comprehensive metabolic panel with GFR   CBC with Differential/Platelet   EKG 12-Lead   Home sleep test    Total time spent on today's visit was 45 minutes, including both face-to-face time and nonface-to-face time personally spent on review of chart (labs and imaging), discussing labs and goals, discussing further work-up, treatment options, referrals to specialist if needed, reviewing outside records of pertinent, answering patient's questions, and coordinating care.   Follow-up: Return in about 3 months (around 03/10/2024) for chronic follow up.  An After Visit Summary was printed and given to the patient.  Abigail Free, MD [facility] [phone]

## 2023-12-20 DIAGNOSIS — R0602 Shortness of breath: Secondary | ICD-10-CM | POA: Diagnosis not present

## 2023-12-20 DIAGNOSIS — G4733 Obstructive sleep apnea (adult) (pediatric): Secondary | ICD-10-CM | POA: Diagnosis not present

## 2023-12-22 ENCOUNTER — Telehealth: Payer: Self-pay | Admitting: Family Medicine

## 2023-12-22 NOTE — Telephone Encounter (Signed)
 Called patient about sleep study. Results showed moderate sleep apnea agreed to APAP.  Also needs continuous oxygen at 2 L.  Patient agreed and order to be sent in am to [facility].  Coming tomorrow for Potassium level at 1:30 pm.  Dr. Sherre

## 2023-12-23 ENCOUNTER — Other Ambulatory Visit

## 2023-12-23 DIAGNOSIS — E876 Hypokalemia: Secondary | ICD-10-CM | POA: Diagnosis not present

## 2023-12-24 ENCOUNTER — Ambulatory Visit: Payer: Self-pay | Admitting: Family Medicine

## 2023-12-24 LAB — COMPREHENSIVE METABOLIC PANEL WITH GFR
ALT: 18 IU/L (ref 0–44)
AST: 31 IU/L (ref 0–40)
Albumin: 4.5 g/dL (ref 3.9–4.9)
Alkaline Phosphatase: 113 IU/L (ref 44–121)
BUN/Creatinine Ratio: 6 — ABNORMAL LOW (ref 10–24)
BUN: 9 mg/dL (ref 8–27)
Bilirubin Total: 0.3 mg/dL (ref 0.0–1.2)
CO2: 22 mmol/L (ref 20–29)
Calcium: 9.3 mg/dL (ref 8.6–10.2)
Chloride: 102 mmol/L (ref 96–106)
Creatinine, Ser: 1.42 mg/dL — ABNORMAL HIGH (ref 0.76–1.27)
Globulin, Total: 2.3 g/dL (ref 1.5–4.5)
Glucose: 83 mg/dL (ref 70–99)
Potassium: 3.3 mmol/L — ABNORMAL LOW (ref 3.5–5.2)
Sodium: 142 mmol/L (ref 134–144)
Total Protein: 6.8 g/dL (ref 6.0–8.5)
eGFR: 53 mL/min/1.73 — ABNORMAL LOW (ref >59)

## 2023-12-31 ENCOUNTER — Encounter: Payer: Self-pay | Admitting: Family Medicine

## 2024-01-01 ENCOUNTER — Telehealth: Payer: Self-pay

## 2024-01-01 NOTE — Progress Notes (Signed)
 Complex Care Management Note  Care Guide Note 01/01/2024 Name: Reznor Ferrando MRN: [id number] DOB: [birth year]  Tavien Chestnut is a 68 y.o. year old male who sees Cox, Kirsten, MD for primary care. I reached out to [name] by phone today to offer complex care management services.  Mr. Colson was given information about Complex Care Management services today including:   The Complex Care Management services include support from the care team which includes your Nurse Care Manager, Clinical Social Worker, or Pharmacist.  The Complex Care Management team is here to help remove barriers to the health concerns and goals most important to you. Complex Care Management services are voluntary, and the patient may decline or stop services at any time by request to their care team member.   Complex Care Management Consent Status: Patient agreed to services and verbal consent obtained.   Follow up plan:  Telephone appointment with complex care management team member scheduled for:  01/06/24 at 9:00 a.m.   Encounter Outcome:  Patient Scheduled  [facility]  [facility], [facility] VBCI Assistant Direct Dial: [phone]  Fax: [phone]

## 2024-01-04 ENCOUNTER — Other Ambulatory Visit: Payer: Self-pay

## 2024-01-04 MED ORDER — EZETIMIBE 10 MG PO TABS: 10.0000 mg | ORAL_TABLET | Freq: Every day | ORAL | 1 refills | Status: AC

## 2024-01-06 ENCOUNTER — Other Ambulatory Visit

## 2024-01-06 DIAGNOSIS — R0602 Shortness of breath: Secondary | ICD-10-CM

## 2024-01-06 DIAGNOSIS — I119 Hypertensive heart disease without heart failure: Secondary | ICD-10-CM

## 2024-01-06 NOTE — Progress Notes (Unsigned)
 01/06/2024 Name: Joe Arroyo MRN: [id number] DOB: 1955-10-10  Chief Complaint  Patient presents with   Medication Management    Joe Arroyo is a 68 y.o. year old male who presented for a telephone visit.   They were referred to the pharmacist by their PCP for assistance in managing complex medication management.    Subjective:  Care Team: Primary Care Provider: Sherre Clapper, MD ; Next Scheduled Visit:  Future Appointments  Date Time Provider Department Center  02/05/2024 10:45 AM Joe Norris, MD [facility] None  03/16/2024 10:00 AM Cox[facility]apper, MD [facility]    Medication Access/Adherence  Current Pharmacy:  [facility] [place] [place] - [address] [place] [address] Phone: [phone] Fax: [phone]  [facility] [address] [place] [address] Phone: [phone] Fax: [phone]   Patient reports affordability concerns with their medications: No  Patient reports access/transportation concerns to their pharmacy: No  Patient reports adherence concerns with their medications:  No  keeps up with medications without having to use pill box.    Medication Management:  Current adherence strategy: keeps up with medications using bottles, systematically keeps up with AM/PM meds.   Patient reports Good adherence to medications  Patient reports the following barriers to adherence: none stated  Recent fill dates: UTD on rosuvastatin  Rx   Primary concern is ongoing SOB over past several years. Has had numerous workups without any clear insight into what might be causing the problem.    Objective:  No results found for: HGBA1C  Lab Results  Component Value Date   CREATININE 1.42 (H) 12/23/2023   BUN 9 12/23/2023   NA 142 12/23/2023   K 3.3 (L) 12/23/2023   CL 102 12/23/2023   CO2 22 12/23/2023    Lab Results  Component Value Date   CHOL 110  12/09/2023   HDL 39 (L) 12/09/2023   LDLCALC 37 12/09/2023   TRIG 213 (H) 12/09/2023   CHOLHDL 2.8 12/09/2023    Medications Reviewed Today     Reviewed by Pandora Cadet, [facility] (Pharmacist) on 01/06/24 at 1008  Med List Status: <None>   Medication Order Taking? Sig Documenting Provider Last Dose Status Informant  Albuterol-Budesonide (AIRSUPRA ) 90-80 MCG/ACT AERO [id number]  Inhale 2 puffs into the lungs 4 (four) times daily as needed (shortness of breath).  Patient not taking: Reported on 01/06/2024   CoxClapper, MD  Active   amLODipine  (NORVASC ) 2.5 MG tablet [id number]  Take 1 tablet (2.5 mg total) by mouth daily. for blood pressure Cox, Kirsten, MD  Active   azelastine  (ASTELIN ) 0.1 % nasal spray [id number]  1-2 sprays in each nostril twice a day as needed Joe Norris, MD  Active   cloNIDine  (CATAPRES ) 0.3 MG tablet [id number]  Take 1 tablet (0.3 mg total) by mouth daily. Cox, Kirsten, MD  Active   cyanocobalamin (VITAMIN B12) 1000 MCG tablet [id number]  Take 1,000 mcg by mouth daily. [provider]  Active   esomeprazole (NEXIUM) 10 MG packet [id number] Yes Take 10 mg by mouth daily before breakfast. [provider]  Active   ezetimibe  (ZETIA ) 10 MG tablet [id number]  Take 1 tablet (10 mg total) by mouth daily. Cox, Kirsten, MD  Active   finasteride (PROSCAR) 5 MG tablet [id number]  Take 5 mg by mouth daily. [provider]  Active Self  fluticasone  (FLONASE ) 50 MCG/ACT nasal spray [id number]  1-2 sprays in each nostril daily Joe Norris, MD  Active   furosemide  (LASIX ) 40 MG tablet [id number]  Take 1 tablet (40 mg total) by mouth 2 (two) times daily. Carlin Delon BROCKS, NP  Active   ipratropium (ATROVENT ) 0.06 % nasal spray [id number]  1-2 sprays in each nostril up to 3 times a day as needed for runny nose/post nasal drip/drainage Joe Norris, MD  Active   levocetirizine (XYZAL ) 5 MG tablet [id number]  Can take one tablet once daily as needed. Joe Norris, MD  Active   magnesium oxide (MAG-OX) 400 (240 Mg) MG tablet [id number]  Take 400 mg by mouth daily. [provider]  Active   montelukast  (SINGULAIR ) 10 MG tablet [id number]  Take 1 tablet (10 mg total) by mouth at bedtime. Joe Norris, MD  Active   Multiple Vitamins-Minerals (CENTRUM SILVER ULTRA MENS PO) [id number]  Take 1 tablet by mouth daily. [provider]  Consider Medication Status and Discontinue (Patient Preference) Self  Multiple Vitamins-Minerals (PRESERVISION AREDS 2 PO) [id number] Yes Take 1 tablet by mouth in the morning and at bedtime. [provider]  Active   Omega-3 Fatty Acids (FISH OIL PO) [id number]  Take 1 tablet by mouth 2 (two) times daily. [provider]  Active Self  potassium chloride  SA (KLOR-CON  M) 20 MEQ tablet [id number]  Take 1 tablet (20 mEq total) by mouth daily. Cox, Kirsten, MD  Active   rosuvastatin  (CRESTOR ) 40 MG tablet [id number]  Take 1 tablet (40 mg total) by mouth daily. Cox, Kirsten, MD  Active   zinc gluconate 50 MG tablet [id number]  Take 50 mg by mouth daily.  Patient not taking: Reported on 01/06/2024   [provider]  Consider Medication Status and Discontinue               Assessment/Plan:   Medication Management: - Currently strategy sufficient to maintain appropriate adherence to prescribed medication regimen - Reviewed medications for possible associations to SOB - no obvious association b/w medications and symptoms. In theory clonidine  could cause respiratory depression through central nervous system activity. Could consider long term dose reduction -> stopping clonidine  and upping amlodipine  to 5mg . Otherwise so suggestion or recommendation based off of current OTC and RX medications.    Follow Up Plan: PRN  Lang Sieve, PharmD, BCGP Clinical Pharmacist  [phone]

## 2024-01-22 ENCOUNTER — Other Ambulatory Visit: Payer: Self-pay | Admitting: Family Medicine

## 2024-01-22 DIAGNOSIS — E782 Mixed hyperlipidemia: Secondary | ICD-10-CM

## 2024-01-22 MED ORDER — ROSUVASTATIN CALCIUM 40 MG PO TABS: 40.0000 mg | ORAL_TABLET | Freq: Every day | ORAL | 0 refills | Status: DC

## 2024-01-22 NOTE — Telephone Encounter (Signed)
 Copied from CRM [id number]. Topic: Clinical - Medication Refill >> Jan 22, 2024  8:44 AM Wess RAMAN wrote: Medication: rosuvastatin  (CRESTOR ) 40 MG tablet   Has the patient contacted their pharmacy? Yes (Agent: If no, request that the patient contact the pharmacy for the refill. If patient does not wish to contact the pharmacy document the reason why and proceed with request.) (Agent: If yes, when and what did the pharmacy advise?) Have tried to reach doctor's office  This is the patient's preferred pharmacy:  [facility] - [place], [place] - [place], [place] - [address] [place] [address] Phone: [phone] Fax: [phone]    Is this the correct pharmacy for this prescription? Yes If no, delete pharmacy and type the correct one.   Has the prescription been filled recently? Yes  Is the patient out of the medication? Yes  Has the patient been seen for an appointment in the last year OR does the patient have an upcoming appointment? Yes  Can we respond through MyChart? No  Agent: Please be advised that Rx refills may take up to 3 business days. We ask that you follow-up with your pharmacy.

## 2024-02-03 ENCOUNTER — Other Ambulatory Visit: Payer: Self-pay

## 2024-02-03 ENCOUNTER — Other Ambulatory Visit

## 2024-02-03 DIAGNOSIS — L820 Inflamed seborrheic keratosis: Secondary | ICD-10-CM | POA: Diagnosis not present

## 2024-02-03 DIAGNOSIS — D225 Melanocytic nevi of trunk: Secondary | ICD-10-CM | POA: Diagnosis not present

## 2024-02-03 DIAGNOSIS — L570 Actinic keratosis: Secondary | ICD-10-CM | POA: Diagnosis not present

## 2024-02-03 DIAGNOSIS — E876 Hypokalemia: Secondary | ICD-10-CM

## 2024-02-03 DIAGNOSIS — L814 Other melanin hyperpigmentation: Secondary | ICD-10-CM | POA: Diagnosis not present

## 2024-02-03 DIAGNOSIS — N289 Disorder of kidney and ureter, unspecified: Secondary | ICD-10-CM | POA: Diagnosis not present

## 2024-02-03 DIAGNOSIS — L578 Other skin changes due to chronic exposure to nonionizing radiation: Secondary | ICD-10-CM | POA: Diagnosis not present

## 2024-02-03 LAB — COMPREHENSIVE METABOLIC PANEL WITH GFR
ALT: 13 IU/L (ref 0–44)
AST: 21 IU/L (ref 0–40)
Albumin: 4.6 g/dL (ref 3.9–4.9)
Alkaline Phosphatase: 110 IU/L (ref 47–123)
BUN/Creatinine Ratio: 6 — ABNORMAL LOW (ref 10–24)
BUN: 9 mg/dL (ref 8–27)
Bilirubin Total: 0.4 mg/dL (ref 0.0–1.2)
CO2: 21 mmol/L (ref 20–29)
Calcium: 10.1 mg/dL (ref 8.6–10.2)
Chloride: 102 mmol/L (ref 96–106)
Creatinine, Ser: 1.57 mg/dL — ABNORMAL HIGH (ref 0.76–1.27)
Globulin, Total: 2.7 g/dL (ref 1.5–4.5)
Glucose: 88 mg/dL (ref 70–99)
Potassium: 4.5 mmol/L (ref 3.5–5.2)
Sodium: 139 mmol/L (ref 134–144)
Total Protein: 7.3 g/dL (ref 6.0–8.5)
eGFR: 47 mL/min/1.73 — ABNORMAL LOW (ref >59)

## 2024-02-03 MED ORDER — POTASSIUM CHLORIDE CRYS ER 20 MEQ PO TBCR: 20.0000 meq | EXTENDED_RELEASE_TABLET | Freq: Two times a day (BID) | ORAL | 1 refills | Status: AC

## 2024-02-04 ENCOUNTER — Ambulatory Visit: Payer: Self-pay | Admitting: Family Medicine

## 2024-02-05 ENCOUNTER — Ambulatory Visit: Admitting: Internal Medicine

## 2024-03-02 ENCOUNTER — Ambulatory Visit (INDEPENDENT_AMBULATORY_CARE_PROVIDER_SITE_OTHER)
Admission: RE | Admit: 2024-03-02 | Discharge: 2024-03-02 | Disposition: A | Discharge status: Home / Self Care | Source: Ambulatory Visit | Attending: Cardiology | Admitting: Cardiology

## 2024-03-02 ENCOUNTER — Other Ambulatory Visit: Payer: Self-pay

## 2024-03-02 ENCOUNTER — Ambulatory Visit: Payer: Self-pay | Admitting: Cardiology

## 2024-03-02 DIAGNOSIS — I7121 Aneurysm of the ascending aorta, without rupture: Secondary | ICD-10-CM

## 2024-03-02 DIAGNOSIS — I700 Atherosclerosis of aorta: Secondary | ICD-10-CM | POA: Diagnosis not present

## 2024-03-04 ENCOUNTER — Ambulatory Visit: Admitting: Internal Medicine

## 2024-03-04 ENCOUNTER — Encounter: Payer: Self-pay | Admitting: Internal Medicine

## 2024-03-04 VITALS — BP 146/84 | HR 100 | Resp 18

## 2024-03-04 DIAGNOSIS — G4733 Obstructive sleep apnea (adult) (pediatric): Secondary | ICD-10-CM

## 2024-03-04 DIAGNOSIS — K219 Gastro-esophageal reflux disease without esophagitis: Secondary | ICD-10-CM

## 2024-03-04 DIAGNOSIS — R778 Other specified abnormalities of plasma proteins: Secondary | ICD-10-CM | POA: Diagnosis not present

## 2024-03-04 DIAGNOSIS — R0609 Other forms of dyspnea: Secondary | ICD-10-CM

## 2024-03-04 DIAGNOSIS — I2510 Atherosclerotic heart disease of native coronary artery without angina pectoris: Secondary | ICD-10-CM

## 2024-03-04 DIAGNOSIS — I119 Hypertensive heart disease without heart failure: Secondary | ICD-10-CM

## 2024-03-04 DIAGNOSIS — J3089 Other allergic rhinitis: Secondary | ICD-10-CM | POA: Diagnosis not present

## 2024-03-04 MED ORDER — MONTELUKAST SODIUM 10 MG PO TABS: ORAL_TABLET | ORAL | 5 refills | Status: AC

## 2024-03-04 NOTE — Progress Notes (Signed)
 Follow Up Note  RE: Joe Arroyo MRN: [id number] DOB: 1956/02/03 Date of Office Visit: 03/04/2024  Referring provider: Sherre Clapper, MD Primary care provider: Sherre Clapper, MD  Chief Complaint: Allergic Rhinitis   History of Present Illness: I had the pleasure of seeing Joe Arroyo for a follow up visit at the [facility] on 03/04/2024. He is a 68 y.o. male with complicated cardiopulmonary history who is being followed for CRS, allergic rhinitis. His previous allergy  office visit was on 08/21/23 with Dr. Lorin. Today is a regular follow up visit.  History obtained yeah I have from patient, chart review. At last visit: FEV1: 2.53L, 73% predicted  Discussed the use of AI scribe software for clinical note transcription with the patient, who gave verbal consent to proceed.  History of Present Illness Summary of recent labs/studies:   Ref Range & Units 9 mo ago Comments  T Albumin 3.5 - 5.0 g/dL 4.3   Alpha-1 Globulin 0.2 - 0.5 g/dL 0.4   Alpha-2 Globulin 0.5 - 1.1 g/dL 1.2 High    Beta-1 Globulin 0.3 - 0.6 g/dL 0.5   Beta-2 Globulin 0.2 - 0.6 g/dL 0.4   Gammaglobulin 0.5 - 1.5 g/dL 0.8    Component 97/78/74 05/27/23  Kappa Free, Serum 3.48 High  3.01 High   Lambda Free, Serum 2.58 2.31  K/L FLC Ratio 1.35 1.30   CT CHEST WITHOUT CONTRAST   TECHNIQUE: Multidetector CT imaging of the chest was performed following the standard protocol without IV contrast.   RADIATION DOSE REDUCTION: This exam was performed according to the departmental dose-optimization program which includes automated exposure control, adjustment of the mA and/or kV according to patient size and/or use of iterative reconstruction technique.   COMPARISON:  CT chest 08/21/2023.   FINDINGS: Cardiovascular: Again seen is an ascending aortic aneurysm measuring 4.5 cm AP on image 8, series 60 2 x 4.3 cm transverse on image 107, series 601. AP measurement on the prior exam was  repeated on today's study. Heart size is normal. No pericardial effusion. Calcific aortic and coronary atherosclerosis noted.   Mediastinum/Nodes: Negative for lymphadenopathy. Hiatal hernia is unchanged.   Lungs/Pleura: No pleural effusion. No nodule, mass or consolidative process. Scattered areas of mild linear scarring are unchanged.   Upper Abdomen: Negative.   Musculoskeletal: No acute abnormality or focal lesion.   IMPRESSION: 1. No change in the patient's ascending aortic aneurysm measuring 4.5 x 4.3 cm. Recommend annual imaging followup by CTA or MRA. This recommendation follows 2010 ACCF/AHA/AATS/ACR/ASA/SCA/SCAI/SIR/STS/SVM Guidelines for the Diagnosis and Management of Patients with Thoracic Aortic Disease. Circulation. 2010; 121: [id number] 2. Calcific coronary artery disease. 3. Hiatal hernia.  Full PFTs with MIP/MEP 06/12/23: CONCLUSION   Grade AA study. Mild simple restrictive impairment with a mild gas transfer impairment. MIP and MEP are below lower limit of normal.   ---------------------------------------------------------------------------------------------------  Joe Arroyo is a 68 year old male who presents with progressive fatigue and breathlessness.  He has had very thorough work up by me, pulm, cardiology, neurology and no identifiable cause at this time.   Fatigue and dyspnea - Progressive fatigue and breathlessness described as a 'cloud over' him - Symptoms have worsened over time, resulting in significant limitations in daily activities - Spends three to four days not leaving the house except to get the mail due to lack of energy - will flare with changes in weather   Peripheral edema - Swelling in feet - Improvement with diuretic therapy  Neurological  symptoms - Diagnosed with early stages of neuropathy by a neurologist in [facility] - Neuro does not think dyspnea is due to NM disorder   Laboratory findings - Previous SPEP and UPEP  showed slight irregularities in the gamma region, not significantly abnormal - Due for repeat SPEP and UPEP as it has been six months since last evaluation  Covid-19 exposure and vaccination - Symptoms began after receiving COVID vaccination; suspects a connection - Consistently tested negative for COVID-19 - Contracted COVID-19 during a hospital stay three years after retirement despite prior exposure to COVID-positive individuals without infection  Diagnostic evaluation - Multiple consultations with cardiology and neurology without definitive diagnosis - Frustration with lack of diagnosis and extensive travel for consultations   Assessment and Plan: Joe Arroyo is a 68 y.o. male with: Dyspnea on exertion  Other allergic rhinitis  Abnormal SPEP - Plan: Protein Electrophoresis, (serum), PE and FLC, Serum  Gastroesophageal reflux disease without esophagitis  Hypertensive heart disease without heart failure  OSA (obstructive sleep apnea)  Coronary artery disease involving native coronary artery of native heart without angina pectoris   Plan: Patient Instructions  Allergy  testing previously showed positive to grass pollen and weed pollen  CT Scan : mild CRS and deviated septum   Full PFTs with MIP/MEP 06/12/23: CONCLUSION   Grade AA study. Mild simple restrictive impairment with a mild gas transfer impairment. MIP and MEP are below lower limit of normal.   Will get updated SPEP/K/L free light chains   Start breathing exercises   Re-establish care with pulmonary    Seasonal allergic Rhinitis with chronic sinusitis, controlled  - Continue allergy  avoidance measures to pollen   - Continue Nasal Steroid Spray: Options include Flonase  (fluticasone ), Nasocort (triamcinolone ), Nasonex  (mometasome) 1- 2 sprays in each nostril daily (can buy over-the-counter if not covered by insurance)  Best results if used daily. - Continue Astelin  (Azelastine ) 1-2 sprays in each nostril twice a day  as needed.  You may use this as needed for nasal congestion/itchy ears/itchy nose if desired - Continue Atrovent  (Ipratropium Bromide ) 1-2 sprays in each nostril up to 3 times a day as needed for runny nose/post nasal drip/drainage.  Use less frequently if airway gets too dry. - Continue Singulair  (Montelukast ) 10mg  nightly. - Continue over the counter antihistamine daily or daily as needed.   -Your options include Zyrtec (Cetirizine) 10mg , Claritin (Loratadine) 10mg , Allegra (Fexofenadine) 180mg , or Xyzal  (Levocetirinze) 5mg   Follow up: 12 months  Thank you so much for letting me partake in your care today.  Don't hesitate to reach out if you have any additional concerns!  Joe Springer, MD  [facility] [place] [place]   Meds ordered this encounter  Medications   montelukast  (SINGULAIR ) 10 MG tablet    Sig: Take one tablet once daily as directed.    Dispense:  30 tablet    Refill:  5    Lab Orders         Protein Electrophoresis, (serum)         PE and FLC, Serum     Diagnostics: None done    Medication List:  Current Outpatient Medications  Medication Sig Dispense Refill   amLODipine  (NORVASC ) 2.5 MG tablet Take 1 tablet (2.5 mg total) by mouth daily. for blood pressure 90 tablet 1   aspirin -acetaminophen -caffeine (EXCEDRIN MIGRAINE) 250-250-65 MG tablet Take 1 tablet by mouth as needed for headache (reports having a baseline headache).     azelastine  (ASTELIN ) 0.1 % nasal spray  1-2 sprays in each nostril twice a day as needed 30 mL 5   cloNIDine  (CATAPRES ) 0.3 MG tablet Take 1 tablet (0.3 mg total) by mouth daily. 90 tablet 1   cyanocobalamin (VITAMIN B12) 1000 MCG tablet Take 1,000 mcg by mouth daily.     esomeprazole (NEXIUM) 10 MG packet Take 10 mg by mouth daily before breakfast.     ezetimibe  (ZETIA ) 10 MG tablet Take 1 tablet (10 mg total) by mouth daily. 90 tablet 1   finasteride (PROSCAR) 5 MG tablet Take 5 mg by mouth daily.     fluticasone   (FLONASE ) 50 MCG/ACT nasal spray 1-2 sprays in each nostril daily 16 g 5   furosemide  (LASIX ) 40 MG tablet Take 1 tablet (40 mg total) by mouth 2 (two) times daily. 180 tablet 3   ipratropium (ATROVENT ) 0.06 % nasal spray 1-2 sprays in each nostril up to 3 times a day as needed for runny nose/post nasal drip/drainage 15 mL 5   levocetirizine (XYZAL ) 5 MG tablet Can take one tablet once daily as needed. 90 tablet 1   magnesium oxide (MAG-OX) 400 (240 Mg) MG tablet Take 400 mg by mouth daily.     montelukast  (SINGULAIR ) 10 MG tablet Take one tablet once daily as directed. 30 tablet 5   Multiple Vitamins-Minerals (PRESERVISION AREDS 2 PO) Take 1 tablet by mouth in the morning and at bedtime.     Omega-3 Fatty Acids (FISH OIL PO) Take 1 tablet by mouth 2 (two) times daily.     potassium chloride  SA (KLOR-CON  M) 20 MEQ tablet Take 1 tablet (20 mEq total) by mouth 2 (two) times daily. 180 tablet 1   rosuvastatin  (CRESTOR ) 40 MG tablet Take 1 tablet (40 mg total) by mouth daily. 90 tablet 0   No current facility-administered medications for this visit.   Allergies: Allergies  Allergen Reactions   Nitroglycerin  Other (See Comments)    Severe hypotension, caused 100 point drop in systolic BP   Beta Adrenergic Blockers Other (See Comments)    bradycardia   Bystolic [Nebivolol Hcl] Other (See Comments)    bradycardia   Carvedilol Other (See Comments)    bradycardia   Pravastatin Other (See Comments)    made my joints hurt   Tamsulosin Other (See Comments)    Syncope   Hctz [Hydrochlorothiazide] Rash   Hydralazine Rash   Penicillins Rash   I reviewed his past medical history, social history, family history, and environmental history and no significant changes have been reported from his previous visit.  ROS: All others negative except as noted per HPI.   Objective: BP (!) 146/84   Pulse 100   Resp 18   SpO2 98%  There is no height or weight on file to calculate BMI. General  Appearance:  Alert, cooperative, no distress, appears stated age  Head:  Normocephalic, without obvious abnormality, atraumatic  Eyes:  Conjunctiva clear, EOM's intact  Nose: Nares normal, normal mucosa, no visible anterior polyps, and septum midline  Throat: Lips, tongue normal; teeth and gums normal, normal posterior oropharynx  Neck: Supple, symmetrical  Lungs:   clear to auscultation bilaterally, Respirations  slightly labored breathing l no coughing  Heart:  regular rate and rhythm and no murmur, Appears well perfused  Extremities: No edema  Skin: Skin color, texture, turgor normal, no rashes or lesions on visualized portions of skin  Neurologic: No gross deficits   Previous notes and tests were reviewed. The plan was reviewed with the patient/family, and  all questions/concerned were addressed.  It was my pleasure to see Joe Arroyo today and participate in his care. Please feel free to contact me with any questions or concerns.  Sincerely,  Joe Springer, MD  Allergy  & Immunology  [facility]  [place] Office: [phone]

## 2024-03-04 NOTE — Patient Instructions (Addendum)
 Allergy  testing previously showed positive to grass pollen and weed pollen  CT Scan : mild CRS and deviated septum   Full PFTs with MIP/MEP 06/12/23: CONCLUSION   Grade AA study. Mild simple restrictive impairment with a mild gas transfer impairment. MIP and MEP are below lower limit of normal.   Will get updated SPEP/K/L free light chains   Start breathing exercises   Re-establish care with pulmonary    Seasonal allergic Rhinitis with chronic sinusitis, controlled  - Continue allergy  avoidance measures to pollen   - Continue Nasal Steroid Spray: Options include Flonase  (fluticasone ), Nasocort (triamcinolone ), Nasonex  (mometasome) 1- 2 sprays in each nostril daily (can buy over-the-counter if not covered by insurance)  Best results if used daily. - Continue Astelin  (Azelastine ) 1-2 sprays in each nostril twice a day as needed.  You may use this as needed for nasal congestion/itchy ears/itchy nose if desired - Continue Atrovent  (Ipratropium Bromide ) 1-2 sprays in each nostril up to 3 times a day as needed for runny nose/post nasal drip/drainage.  Use less frequently if airway gets too dry. - Continue Singulair  (Montelukast ) 10mg  nightly. - Continue over the counter antihistamine daily or daily as needed.   -Your options include Zyrtec (Cetirizine) 10mg , Claritin (Loratadine) 10mg , Allegra (Fexofenadine) 180mg , or Xyzal  (Levocetirinze) 5mg   Follow up: 12 months  Thank you so much for letting me partake in your care today.  Don't hesitate to reach out if you have any additional concerns!  Hargis Springer, MD  [facility] [place] [place]

## 2024-03-08 NOTE — Progress Notes (Unsigned)
 Cardiology Office Note:    Date:  03/09/2024   ID:  Joe Arroyo, DOB Mar 31, 1956, MRN [id number]  PCP:  Sherre Clapper, MD  Cardiologist:  Redell Leiter, MD    Referring MD: Sherre Clapper, MD    ASSESSMENT:    1. Shortness of breath   2. Aneurysm of ascending aorta without rupture   3. Coronary artery disease involving native coronary artery of native heart with other form of angina pectoris   4. Hypertensive heart disease without heart failure   5. Mixed hyperlipidemia    PLAN:    In order of problems listed above:  Discontinue statin check anti-HMG Co. a reductase Stable on serial imaging due to age I doubt he will ever need intervention and in about a year year and a half we will recheck a CT scan gated Stable CAD no anginal discomfort continue treatment including aspirin  lipid-lowering and Zetia  discontinue statin and use Repatha  and avoid muscular adverse effect Well-controlled continue clonidine    Next appointment: 1 year   Medication Adjustments/Labs and Tests Ordered: Current medicines are reviewed at length with the patient today.  Concerns regarding medicines are outlined above.  No orders of the defined types were placed in this encounter.  No orders of the defined types were placed in this encounter.    History of Present Illness:    Joe Arroyo is a 68 y.o. male with a hx of CAD with remote PCI and stent posterolateral branch right coronary artery June 2023 hypertension previously resistant aneurysmal ascending aorta 46 mmHg with mild aortic regurgitation obstructive sleep apnea CPAP intolerant persistent sinus tachycardia following COVID-19 infection and emphysema on CT scan with abnormal flow-volume loop last seen 12/31/2022.  To sort out his shortness of breath he underwent cardiopulmonary testing there is no indication of congestive heart failure and the defect appeared to be predominantly ventilatory with a restrictive lung pattern.  Mostly recently seen  [facility] pulmonary medicine 06/12/2023 felt to have generalized muscle weakness and diaphragmatic dysfunction to account for shortness of breath.  He had a CT of the chest performed 03/02/2024 showing the ascending aorta unchanged 4.5 x 4.3 cm lung structure was read as normal hiatal hernia present.  Follow-up myocardial perfusion post PCI October 2023 normal perfusion normal function EF 65%.  Compliance with diet, lifestyle and medications: Yes  He continues to be short of breath at times even like a conversation with activity slowly getting worse He complains of neuropathic symptoms but also has muscle weakness and has to use his arms to assist in getting out of chair consistent with statin adverse effect I think we should stop his statin we will wait several weeks substitute Repatha  can continue Zetia  also check lab test HMG Co. a reductase antibody. Fortunately has follow-up with neurology pending January Is not having angina edema orthopnea He clearly does not have heart failure or demonstrable ischemia and a cardiopulmonary exercise test pointed away from heart failure Past Medical History:  Diagnosis Date   Abnormal cardiac CT angiography    Atypical chest pain 01/07/2021   Calculus of left kidney 01/07/2021   Chronic idiopathic constipation 01/07/2021   Coronary artery disease    Dizziness 06/23/2023   Dyspnea on exertion 09/05/2019   Encounter for Medicare annual wellness exam 11/17/2023   Essential hypertension, benign 05/14/2018   GERD (gastroesophageal reflux disease) 09/28/2020   Hiatal hernia 09/28/2020   History of hiatal hernia    History of kidney stones    Hyperlipidemia 05/14/2018  Hypertensive heart disease 05/14/2018   09/17/2021: SUMMARY  Severe single-vessel disease as indicated by Coronary CTA with RPL 2 having tandem 90% and 80% stenoses (CT FFR positive)-this vessel is at least the same size as the LAD  Successful DES PCI of RPL 2 covering both  lesions with a Synergy DES 2.25 mm x 20 mm postdilated in tapered fashion from 2.5 to 2.3 mm.)  Otherwise minimal CAD with relatively small caliber left coronary syst   Immunization due 12/28/2022   Muscle weakness (generalized) 06/15/2023   Neuropathy 05/2023   Non-seasonal allergic rhinitis due to pollen 08/16/2019   OSA (obstructive sleep apnea) 09/28/2020   Other chest pain 01/07/2021   Other chronic sinusitis 05/02/2022   Other fatigue 03/17/2022   Other seasonal allergic rhinitis 05/02/2022   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   Resolved with surgery   Shortness of breath 09/05/2019   Sinus bradycardia 05/14/2018   SOB (shortness of breath) 09/05/2019   Stage 3a chronic kidney disease (HCC) 12/27/2022   Syncope 09/05/2019   Thoracic ascending aortic aneurysm    ascending TAA 4.6 x 4.4 cm 10/03/20 CTA chest    Current Medications: Current Meds  Medication Sig   amLODipine  (NORVASC ) 2.5 MG tablet Take 1 tablet (2.5 mg total) by mouth daily. for blood pressure   aspirin -acetaminophen -caffeine (EXCEDRIN MIGRAINE) 250-250-65 MG tablet Take 1 tablet by mouth as needed for headache (reports having a baseline headache).   azelastine  (ASTELIN ) 0.1 % nasal spray 1-2 sprays in each nostril twice a day as needed   cloNIDine  (CATAPRES ) 0.3 MG tablet Take 1 tablet (0.3 mg total) by mouth daily.   cyanocobalamin (VITAMIN B12) 1000 MCG tablet Take 1,000 mcg by mouth daily.   esomeprazole (NEXIUM) 10 MG packet Take 10 mg by mouth daily before breakfast.   ezetimibe  (ZETIA ) 10 MG tablet Take 1 tablet (10 mg total) by mouth daily.   finasteride (PROSCAR) 5 MG tablet Take 5 mg by mouth daily.   fluticasone  (FLONASE ) 50 MCG/ACT nasal spray 1-2 sprays in each nostril daily   furosemide  (LASIX ) 40 MG tablet Take 1 tablet (40 mg total) by mouth 2 (two) times daily.   ipratropium (ATROVENT ) 0.06 % nasal spray 1-2 sprays in each nostril up to 3 times a day as needed for runny nose/post nasal drip/drainage    levocetirizine (XYZAL ) 5 MG tablet Can take one tablet once daily as needed.   magnesium oxide (MAG-OX) 400 (240 Mg) MG tablet Take 400 mg by mouth daily.   montelukast  (SINGULAIR ) 10 MG tablet Take one tablet once daily as directed.   Multiple Vitamins-Minerals (PRESERVISION AREDS 2 PO) Take 1 tablet by mouth in the morning and at bedtime.   Omega-3 Fatty Acids (FISH OIL PO) Take 1 tablet by mouth 2 (two) times daily.   potassium chloride  SA (KLOR-CON  M) 20 MEQ tablet Take 1 tablet (20 mEq total) by mouth 2 (two) times daily.   [DISCONTINUED] rosuvastatin  (CRESTOR ) 40 MG tablet Take 1 tablet (40 mg total) by mouth daily.      EKGs/Labs/Other Studies Reviewed:    The following studies were reviewed today:  Cardiac Studies & Procedures   ______________________________________________________________________________________________ CARDIAC CATHETERIZATION  CARDIAC CATHETERIZATION 10/07/2021  Conclusion   CULPRIT LESION SEGMENT: 2nd RPL-1 lesion is 90% stenosed.  2nd RPL-2 lesion is 80% stenosed.  (FFR ct positive)   2nd RPL-2 lesion is 80% stenosed.   A drug-eluting stent was successfully placed covering both lesions using a SYNERGY XD 2.25X24.  Deployed to 2.3 mm  postdilated proximally to 2.5 mm (tapered from 2.5 to 2.3 mm distally.)   Post intervention, there is a 0% residual stenosis across the entire segment..   Otherwise angiographically normal coronaries; Right Dominant System   There is no aortic valve stenosis.   Relatively normal RHC numbers   --------------------------------------------------------------  SUMMARY Severe single-vessel disease as indicated by Coronary CTA with RPL 2 having tandem 90% and 80% stenoses (CT FFR positive)-this vessel is at least the same size as the LAD Successful DES PCI of RPL 2 covering both lesions with a Synergy DES 2.25 mm x 20 mm postdilated in tapered fashion from 2.5 to 2.3 mm.) Otherwise minimal CAD with relatively small caliber left  coronary system and large dominant RCA. Normal LVEF by Echo with mildly elevated LVEDP 18 mmHg Right Heart Cath Numbers: Mean RAP 5 mmHg, RVP-880 P 22/1-6 mmHg; PAP-main 23/8-15 mmHg, PCWP 11 mmHg; LVP-EDP 110/5-18 mmHg, AoP-MAP 109/58-82 mmHg; AO Sat 99%, PA Sat 70%--Fick CO-CI 4.91-2.33 (mildly reduced)  RECOMMENDATIONS Same-day discharge following RPL 2 PCI with DES Monitor closely for blood pressure-in future would recommend avoiding nitrates-had significant drop in blood pressure response IC NTG Not on beta-blocker because of bradycardia-heart rate in the 50s to low 60s.  Continue amlodipine . Increase rosuvastatin  to 40 mg daily DAPT x6 months (ASA 81 mg/Plavix  75 mg); for next 6 months we will continue Plavix  monotherapy without aspirin  to complete 1 year.    Alm Clay, MD  Findings Coronary Findings Diagnostic  Dominance: Right  Left Main Vessel was injected. Vessel is normal in caliber and large. Vessel is angiographically normal.  Left Anterior Descending Vessel was injected. Vessel is normal in caliber. Vessel is angiographically normal.  First Diagonal Branch Vessel is moderate in size.  First Septal Branch Vessel is small in size.  Second Septal Branch Vessel is small in size.  Left Circumflex Vessel was injected. Vessel is normal in caliber. Vessel is angiographically normal.  Right Coronary Artery Vessel was injected. Vessel is normal in caliber and large. Vessel is angiographically normal.  Acute Marginal Branch Vessel is small in size.  Right Ventricular Branch Vessel is small in size.  First Right Posterolateral Branch Vessel is moderate in size.  Second Right Posterolateral Branch Vessel is large in size. 2nd RPL-1 lesion is 90% stenosed. The lesion is distal to major branch and concentric. 2nd RPL-2 lesion is 80% stenosed.  Intervention  2nd RPL-1 lesion Stent (Also treats lesions: 2nd RPL-2) Lesion length:  22 mm. CATH LAUNCHER [id number]  guide catheter was inserted. Lesion crossed with guidewire using a WIRE ASAHI PROWATER 180CM. Pre-stent angioplasty was performed using a BALLN SAPPHIRE 2.0X15. Maximum pressure:  10 atm. Inflation time:  2 sec. 2 inflations of each lesion A drug-eluting stent was successfully placed using a SYNERGY XD 2.25X24. Maximum pressure: 14 atm. Inflation time: 30 sec. Stent strut is well apposed. Deployed 2.3 mm and with proximal 2/3 of the stent postdilated 2.5 mm Post-stent angioplasty was performed using a BALL SAPPHIRE NC24 2.50X15. Maximum pressure:  16 atm. Inflation time:  20 sec. Proximal 2/3 of stent Tapered postdilation from 2.5 to 2.3 mm. Post-Intervention Lesion Assessment The intervention was successful. Pre-interventional TIMI flow is 3. Post-intervention TIMI flow is 3. Treated lesion length:  24 mm. No complications occurred at this lesion. There is a 0% residual stenosis post intervention.  2nd RPL-2 lesion Stent (Also treats lesions: 2nd RPL-1) See details in 2nd RPL-1 lesion. Post-Intervention Lesion Assessment The intervention was successful. Pre-interventional TIMI flow  is 3. Post-intervention TIMI flow is 3. Treated lesion length:  24 mm. No complications occurred at this lesion. There is a 0% residual stenosis post intervention.   STRESS TESTS  NM MYOCAR MULTI W/SPECT W 02/06/2022  Narrative CLINICAL DATA:  Chest pain, nonspecific anginal equivalent, history of prior revascularization  EXAM: MYOCARDIAL IMAGING WITH SPECT (REST AND PHARMACOLOGIC-STRESS)  GATED LEFT VENTRICULAR WALL MOTION STUDY  LEFT VENTRICULAR EJECTION FRACTION  TECHNIQUE: Standard myocardial SPECT imaging was performed after resting intravenous injection of 10 mCi Tc-63m tetrofosmin . Subsequently, intravenous infusion of Lexiscan  was performed under the supervision of the Cardiology staff. At peak effect of the drug, 30 mCi Tc-68m tetrofosmin  was injected intravenously and standard myocardial  SPECT imaging was performed. Quantitative gated imaging was also performed to evaluate left ventricular wall motion, and estimate left ventricular ejection fraction.  COMPARISON:  CTA chest February 05, 2022.  FINDINGS: Perfusion: No decreased activity in the left ventricle on stress imaging to suggest reversible ischemia or infarction.  Wall Motion: Normal left ventricular wall motion. No left ventricular dilation.  Left Ventricular Ejection Fraction: 65 %  End diastolic volume 93 ml  End systolic volume 31 ml  IMPRESSION: 1. No reversible ischemia or infarction.  2. Normal left ventricular wall motion.  3. Left ventricular ejection fraction 65%  4. Non invasive risk stratification*: Low  *2012 Appropriate Use Criteria for Coronary Revascularization Focused Update: J Am Coll Cardiol. 2012;59(9):857-881. http://content.dementiazones.com.aspx?articleid=1201161   Electronically Signed By: Reyes Holder M.D. On: 02/07/2022 09:19   ECHOCARDIOGRAM  ECHOCARDIOGRAM COMPLETE 02/06/2022  Narrative ECHOCARDIOGRAM REPORT    Patient Name:   Joe Arroyo Date of Exam: 02/06/2022 Medical Rec #:  [id number]     Height:       72.0 in Accession #:    [id number]    Weight:       204.0 lb Date of Birth:  06-26-1955     BSA:          2.149 m Patient Age:    66 years      BP:           139/85 mmHg Patient Gender: M             HR:           84 bpm. Exam Location:  Inpatient  Procedure: 2D Echo  Indications:    Chest pain. syncope  History:        Patient has prior history of Echocardiogram examinations, most recent 09/24/2021. CAD, Abnormal ECG, Signs/Symptoms:Chest Pain and Syncope; Risk Factors:Hypertension, Dyslipidemia and Sleep Apnea.  Sonographer:    Tinnie Barefoot RDCS Referring Phys: [id number] [facility]   Sonographer Comments: Suboptimal subcostal window. IMPRESSIONS   1. Left ventricular ejection fraction, by estimation, is 60 to 65%. The left  ventricle has normal function. The left ventricle has no regional wall motion abnormalities. Left ventricular diastolic parameters are indeterminate. 2. Right ventricular systolic function is normal. The right ventricular size is normal. Tricuspid regurgitation signal is inadequate for assessing PA pressure. 3. The mitral valve is normal in structure. Trivial mitral valve regurgitation. No evidence of mitral stenosis. 4. The aortic valve is normal in structure. Aortic valve regurgitation is mild to moderate. No aortic stenosis is present. 5. There is dilatation of the ascending aorta, measuring 41 mm. 6. The inferior vena cava is normal in size with greater than 50% respiratory variability, suggesting right atrial pressure of 3 mmHg.  FINDINGS Left Ventricle: Left ventricular ejection fraction, by estimation, is  60 to 65%. The left ventricle has normal function. The left ventricle has no regional wall motion abnormalities. The left ventricular internal cavity size was normal in size. There is no left ventricular hypertrophy. Left ventricular diastolic parameters are indeterminate.  Right Ventricle: The right ventricular size is normal. No increase in right ventricular wall thickness. Right ventricular systolic function is normal. Tricuspid regurgitation signal is inadequate for assessing PA pressure.  Left Atrium: Left atrial size was normal in size.  Right Atrium: Right atrial size was normal in size.  Pericardium: There is no evidence of pericardial effusion. Presence of epicardial fat layer.  Mitral Valve: The mitral valve is normal in structure. Trivial mitral valve regurgitation. No evidence of mitral valve stenosis.  Tricuspid Valve: The tricuspid valve is normal in structure. Tricuspid valve regurgitation is trivial. No evidence of tricuspid stenosis.  Aortic Valve: The aortic valve is normal in structure. Aortic valve regurgitation is mild to moderate. Aortic regurgitation PHT measures  427 msec. No aortic stenosis is present.  Pulmonic Valve: The pulmonic valve was normal in structure. Pulmonic valve regurgitation is not visualized. No evidence of pulmonic stenosis.  Aorta: There is dilatation of the ascending aorta, measuring 41 mm.  Venous: The inferior vena cava is normal in size with greater than 50% respiratory variability, suggesting right atrial pressure of 3 mmHg.  IAS/Shunts: No atrial level shunt detected by color flow Doppler.   LEFT VENTRICLE PLAX 2D LVIDd:         4.50 cm   Diastology LVIDs:         2.50 cm   LV e' medial:    6.20 cm/s LV PW:         0.90 cm   LV E/e' medial:  12.0 LV IVS:        1.00 cm   LV e' lateral:   9.14 cm/s LVOT diam:     1.90 cm   LV E/e' lateral: 8.1 LV SV:         72 LV SV Index:   33 LVOT Area:     2.84 cm   RIGHT VENTRICLE RV S prime:     16.50 cm/s TAPSE (M-mode): 2.7 cm  LEFT ATRIUM             Index        RIGHT ATRIUM           Index LA diam:        3.80 cm 1.77 cm/m   RA Area:     14.30 cm LA Vol (A2C):   41.0 ml 19.08 ml/m  RA Volume:   34.30 ml  15.96 ml/m LA Vol (A4C):   53.0 ml 24.67 ml/m LA Biplane Vol: 48.1 ml 22.39 ml/m AORTIC VALVE LVOT Vmax:   138.00 cm/s LVOT Vmean:  90.300 cm/s LVOT VTI:    0.253 m AI PHT:      427 msec  AORTA Ao Root diam: 3.60 cm Ao Asc diam:  4.20 cm  MITRAL VALVE MV Area (PHT): 3.77 cm     SHUNTS MV Decel Time: 201 msec     Systemic VTI:  0.25 m MV E velocity: 74.40 cm/s   Systemic Diam: 1.90 cm MV A velocity: 124.00 cm/s MV E/A ratio:  0.60  Kardie Tobb DO Electronically signed by Dub Huntsman DO Signature Date/Time: 02/06/2022/12:33:35 PM    Final    MONITORS  LONG TERM MONITOR (3-14 DAYS) 03/11/2022  Narrative Patch Wear Time:  13 days and 22  hours (2023-11-08T14:00:06-498 to 2023-11-22T12:51:19-0500)  Patient had a min HR of 45 bpm, max HR of 162 bpm, and avg HR of 66 bpm. Predominant underlying rhythm was Sinus Rhythm.  There were 2  triggered events both sinus rhythm without ectopy.  There were no pauses of 3 seconds or greater and no episodes of second or third-degree AV block.  8 paroxysmal atrial tachycardia runs occurred, the run with the fastest interval lasting 4 beats with a max rate of 162 bpm, the longest lasting 7 beats with an avg rate of 123 bpm. Isolated SVEs were rare (<1.0%), SVE Couplets were rare (<1.0%), and SVE Triplets were rare (<1.0%).  Isolated VEs were rare (<1.0%), VE Couplets were rare (<1.0%), and no VE Triplets were present.   CT SCANS  CT CORONARY FRACTIONAL FLOW RESERVE DATA PREP 08/29/2021  Narrative EXAM: CT-FFR ANALYSIS  CLINICAL DATA:  Possibly obstructive coronary lesion severe distal RCA lesion.  FINDINGS: CT-FFR analysis was performed on the original cardiac CT angiogram dataset. Diagrammatic representation of the CT-FFR analysis is provided in a separate PDF document in PACS. This dictation was created using the PDF document and an interactive 3D model of the results. 3D model is not available in the EMR/PACS. Normal FFR range is >0.80.  1. Left Main: No significant functional stenosis, CT-FFR 0.99.  2. LAD: No significant functional stenosis, CT-FFR 0.99. 3. LCX: No significant functional stenosis, CT-FFR 0.97. 4. RCA: Significant functional stenosis, CT-FFR 0.67 at distal RCA lesion.  IMPRESSION: 1. CT FFR analysis shows evidence of significant functional stenosis distal RCA.  Stanly Leavens MD   Electronically Signed By: Stanly Leavens M.D. On: 08/29/2021 14:59   CT CORONARY MORPH W/CTA COR W/SCORE 08/29/2021  Addendum 08/29/2021  4:14 PM ADDENDUM REPORT: 08/29/2021 16:12  EXAM: OVER-READ INTERPRETATION  CT CHEST  The following report is a limited chest CT over-read performed by radiologist Dr. Rockey Kilts of [facility] on 08/29/2021. This over-read does not include interpretation of cardiac or coronary anatomy or pathology  the coronary CTA interpretation by the cardiologist is attached.  COMPARISON:  01/25/2021 chest radiograph. Most recent chest CT 02/21/2021 from [facility].  FINDINGS: Vascular: Ascending aortic dilatation at 4.4 cm on 19/2, similar to 4.6 cm on the prior exam. Aortic atherosclerosis. No central pulmonary embolism, on this non-dedicated study. Mild dilatation of a pulmonary artery branch within the left lower lobe on 53/12 has been present back to a CTA abdomen of 12/01/2017.  Mediastinum/Nodes: No imaged thoracic adenopathy. Small hiatal hernia.  Lungs/Pleura: No pleural fluid. Right middle lobe 3 mm pulmonary nodule on 37/13 is similar to on the prior exam and present back to 06/21/2020, considered benign.  a a 4 mm subpleural right lower lobe pulmonary nodule on 39/13 is also present on the remote CT and can be presumed a benign subpleural lymph node.  Left base scarring.  Developing 4 mm left lower lobe pulmonary nodule on 63/13 is most likely a subpleural lymph node but not readily apparent on the prior.  Upper Abdomen: Normal imaged portions of the liver, spleen, gallbladder.  Musculoskeletal: No acute osseous abnormality.  IMPRESSION: 1.  No acute findings in the imaged extracardiac chest. 2. Ascending aortic aneurysm, similar at 4.4 cm. Recommend annual imaging followup by CTA or MRA. This recommendation follows 2010 ACCF/AHA/AATS/ACR/ASA/SCA/SCAI/SIR/STS/SVM Guidelines for the Diagnosis and Management of Patients with Thoracic Aortic Disease. Circulation. 2010; 121: Z733-z630. Aortic aneurysm NOS (ICD10-I71.9) 3. Pulmonary nodules, including a developing 4 mm probable subpleural lymph node  in the left lower lobe. No follow-up needed if patient is low-risk. Non-contrast chest CT can be considered in 12 months if patient is high-risk. This recommendation follows the consensus statement: Guidelines for Management of Incidental Pulmonary Nodules Detected on CT  Images: From the Fleischner Society 2017; Radiology 2017; 284:228-243. 4. Hiatal hernia.   Electronically Signed By: Rockey Kilts M.D. On: 08/29/2021 16:12  Narrative CLINICAL DATA:  68 Year-old White Male  EXAM: Cardiac/Coronary  CTA  TECHNIQUE: The patient was scanned on a [employer].  FINDINGS: Scan was triggered in the descending thoracic aorta. Axial non-contrast 3 mm slices were carried out through the heart. The data set was analyzed on a dedicated work station and scored using the Agatson method. Gantry rotation speed was 250 msecs and collimation was .6 mm. 0.8 mg of sl NTG was given. The 3D data set was reconstructed in 5% intervals of the 67-82 % of the R-R cycle. Diastolic phases were analyzed on a dedicated work station using MPR, MIP and VRT modes. The patient received 100 cc of contrast.  Aorta: Moderate ascending aortic aneurysm: 45 mm. No calcifications. No dissection.  Main Pulmonary Artery: Normal size of the pulmonary artery.  Aortic Valve:  Tri-leaflet.  Aortic Valve calcium  score 35.  Mitral Valve: Mild mitral annular calcification.  Coronary Arteries:  Normal coronary origin.  Right dominance.  Coronary Calcium  Score:  Left main: 0  Left anterior descending artery: 0  Left circumflex artery: 3  Right coronary artery: 26  Total: 29  Percentile: 33rd for age, sex, and race matched control.  RCA is a large dominant artery that gives rise to PDA and PLA. There are plaques in the R-PDA: Mild calcified lesion, distal to this there is a severe mixed plaque RCA lesion.  Left main is a large artery that gives rise to LAD and LCX arteries. There is no significant plaque.  LAD is a large vessel that gives rise to two diagonal branches. There is no significant plaque.  LCX is a non-dominant artery that gives rise to two OM branch. Minimal calcified plaque OM1.  Other findings:  Normal variant pulmonary vein drainage into the  left atrium, presence of a right middle pulmonary vein.  Normal left atrial appendage without a thrombus.  Extra-cardiac findings: See attached radiology report for non-cardiac structures.  IMPRESSION: 1. Coronary calcium  score of 29. This was 33rd percentile for age, sex, and race matched control.  2. Normal coronary origin with right dominance.  3. Moderate ascending aortic aneurysm: 45 mm.  4. CAD-RADS 4 Severe stenosis. (70-99% or > 50% left main). Cardiac catheterization or CT FFR is recommended. Consider symptom-guided anti-ischemic pharmacotherapy as well as risk factor modification per guideline directed care.  RECOMMENDATIONS:  Coronary artery calcium  (CAC) score is a strong predictor of incident coronary heart disease (CHD) and provides predictive information beyond traditional risk factors. CAC scoring is reasonable to use in the decision to withhold, postpone, or initiate statin therapy in intermediate-risk or selected borderline-risk asymptomatic adults (age 59-75 years and LDL-C >=70 to <190 mg/dL) who do not have diabetes or established atherosclerotic cardiovascular disease (ASCVD).* In intermediate-risk (10-year ASCVD risk >=7.5% to <20%) adults or selected borderline-risk (10-year ASCVD risk >=5% to <7.5%) adults in whom a CAC score is measured for the purpose of making a treatment decision the following recommendations have been made:  If CAC = 0, it is reasonable to withhold statin therapy and reassess in 5 to 10 years, as long as higher  risk conditions are absent (diabetes mellitus, family history of premature CHD in first degree relatives (males <55 years; females <65 years), cigarette smoking, LDL >=190 mg/dL or other independent risk factors).  If CAC is 1 to 99, it is reasonable to initiate statin therapy for patients >=62 years of age.  If CAC is >=100 or >=75th percentile, it is reasonable to initiate statin therapy at any age.  Cardiology  referral should be considered for patients with CAC scores =400 or >=75th percentile.  *2018 AHA/ACC/AACVPR/AAPA/ABC/ACPM/ADA/AGS/APhA/ASPC/NLA/PCNA Guideline on the Management of Blood Cholesterol: A Report of the American College of Cardiology/American Heart Association Task Force on Clinical Practice Guidelines. J Am Coll Cardiol. 2019;73(24):3168-3209.  Stanly Leavens, MD  Electronically Signed: By: Stanly Leavens M.D. On: 08/29/2021 14:53     ______________________________________________________________________________________________          Recent Labs: 05/26/2023: TSH 2.710 12/09/2023: Hemoglobin 13.2; Platelets 270 02/03/2024: ALT 13; BUN 9; Creatinine, Ser 1.57; Potassium 4.5; Sodium 139  Recent Lipid Panel    Component Value Date/Time   CHOL 110 12/09/2023 1109   TRIG 213 (H) 12/09/2023 1109   HDL 39 (L) 12/09/2023 1109   CHOLHDL 2.8 12/09/2023 1109   LDLCALC 37 12/09/2023 1109   Non-HDL cholesterol 63 Physical Exam:    VS:  BP 110/64   Pulse 60   Ht 6' (1.829 m)   Wt 190 lb 12.8 oz (86.5 kg)   SpO2 97%   BMI 25.88 kg/m     Wt Readings from Last 3 Encounters:  03/09/24 190 lb 12.8 oz (86.5 kg)  12/09/23 195 lb (88.5 kg)  11/17/23 196 lb (88.9 kg)     GEN:  Well nourished, well developed in no acute distress HEENT: Normal NECK: No JVD; No carotid bruits LYMPHATICS: No lymphadenopathy CARDIAC: RRR, no murmurs, rubs, gallops RESPIRATORY:  Clear to auscultation without rales, wheezing or rhonchi  ABDOMEN: Soft, non-tender, non-distended MUSCULOSKELETAL:  No edema; No deformity  SKIN: Warm and dry NEUROLOGIC:  Alert and oriented x 3 PSYCHIATRIC:  Normal affect    Signed, Redell Leiter, MD  03/09/2024 10:15 AM    [facility]

## 2024-03-09 ENCOUNTER — Encounter: Payer: Self-pay | Admitting: Cardiology

## 2024-03-09 ENCOUNTER — Ambulatory Visit: Attending: Cardiology | Admitting: Cardiology

## 2024-03-09 VITALS — BP 110/64 | HR 60 | Ht 72.0 in | Wt 190.8 lb

## 2024-03-09 DIAGNOSIS — I7121 Aneurysm of the ascending aorta, without rupture: Secondary | ICD-10-CM | POA: Diagnosis not present

## 2024-03-09 DIAGNOSIS — I25118 Atherosclerotic heart disease of native coronary artery with other forms of angina pectoris: Secondary | ICD-10-CM

## 2024-03-09 DIAGNOSIS — R0602 Shortness of breath: Secondary | ICD-10-CM | POA: Diagnosis not present

## 2024-03-09 DIAGNOSIS — I119 Hypertensive heart disease without heart failure: Secondary | ICD-10-CM | POA: Diagnosis not present

## 2024-03-09 DIAGNOSIS — E782 Mixed hyperlipidemia: Secondary | ICD-10-CM | POA: Diagnosis not present

## 2024-03-09 MED ORDER — REPATHA SURECLICK 140 MG/ML ~~LOC~~ SOAJ: 140.0000 mg | SUBCUTANEOUS | 12 refills | Status: AC

## 2024-03-09 NOTE — Patient Instructions (Signed)
 Medication Instructions:  Your physician has recommended you make the following change in your medication:   STOP: Crestor   Wait 2 weeks, then  START: Repatha  140 mg every 2 weeks  *If you need a refill on your cardiac medications before your next appointment, please call your pharmacy*  Lab Work: Your physician recommends that you return for lab work in:   Labs in 6 weeks: Lipids, Apo B  If you have labs (blood work) drawn today and your tests are completely normal, you will receive your results only by: MyChart Message (if you have MyChart) OR A paper copy in the mail If you have any lab test that is abnormal or we need to change your treatment, we will call you to review the results.  Testing/Procedures: None  Follow-Up: At [facility], you and your health needs are our priority.  As part of our continuing mission to provide you with exceptional heart care, our providers are all part of one team.  This team includes your primary Cardiologist (physician) and Advanced Practice Providers or APPs (Physician Assistants and Nurse Practitioners) who all work together to provide you with the care you need, when you need it.  Your next appointment:   1 year(s)  Provider:   Redell Leiter, MD    We recommend signing up for the patient portal called MyChart.  Sign up information is provided on this After Visit Summary.  MyChart is used to connect with patients for Virtual Visits (Telemedicine).  Patients are able to view lab/test results, encounter notes, upcoming appointments, etc.  Non-urgent messages can be sent to your provider as well.   To learn more about what you can do with MyChart, go to [email or web address].   Other Instructions None

## 2024-03-16 ENCOUNTER — Ambulatory Visit: Payer: Self-pay

## 2024-03-16 ENCOUNTER — Ambulatory Visit: Admitting: Family Medicine

## 2024-03-16 VITALS — BP 130/72 | HR 59 | Temp 97.9°F | Ht 73.0 in | Wt 191.0 lb

## 2024-03-16 DIAGNOSIS — E782 Mixed hyperlipidemia: Secondary | ICD-10-CM | POA: Diagnosis not present

## 2024-03-16 DIAGNOSIS — R0602 Shortness of breath: Secondary | ICD-10-CM

## 2024-03-16 DIAGNOSIS — R778 Other specified abnormalities of plasma proteins: Secondary | ICD-10-CM

## 2024-03-16 DIAGNOSIS — G4733 Obstructive sleep apnea (adult) (pediatric): Secondary | ICD-10-CM | POA: Diagnosis not present

## 2024-03-16 DIAGNOSIS — I119 Hypertensive heart disease without heart failure: Secondary | ICD-10-CM

## 2024-03-16 LAB — ANTI-HMGCR AB: Anti-HMGCR Ab: <20 CU (ref <20.0)

## 2024-03-16 NOTE — Patient Instructions (Signed)
 If I have ordered a referral, lab work, or a test, please watch for messages/letters in your [place]. Please be aware of unknown numbers, as this may be a specialist's office attempting to call and schedule your appointment. You may wish to enter the specialist's phone number in your contacts, so your phone will not block the calls as SPAM. If you have NOT been contacted with in 2 weeks: Please call the specialist's office  Call [facility].

## 2024-03-16 NOTE — Progress Notes (Signed)
 Subjective:  Patient ID: Joe Arroyo, male    DOB: 1956-03-18  Age: 68 y.o. MRN: [id number]  Chief Complaint  Patient presents with   Medical Management of Chronic Issues    HPI: Discussed the use of AI scribe software for clinical note transcription with the patient, who gave verbal consent to proceed.  History of Present Illness Joe Arroyo is a 68 year old male who presents for a regular follow-up visit.  Myalgia and muscle weakness - Muscle pain and weakness present - Attributes symptoms to his thoughts - Discontinued cholesterol medication approximately one week ago with no change in symptoms  Upper respiratory symptoms - Recent head cold over the past weekend - Symptoms included runny nose and significant coughing - No fever during illness - Symptoms began improving by Monday evening - Currently feeling better  Dyspnea - Breathing sometimes better, sometimes not - Today experiencing less shortness of breath - No chest pain  Laboratory evaluation - Blood work performed approximately three weeks ago - Additional blood tests performed by another physician prior to Thanksgiving - Awaiting results from recent laboratory tests - History of low potassium, rechecked three weeks ago  Positive airway pressure therapy - Uses CPAP machine - Has not received follow-up contact from CPAP supply company - Frustrated by lack of communication regarding CPAP supplies       03/29/2024   11:24 AM 03/16/2024   10:15 AM 11/17/2023    2:51 PM 08/27/2023    9:02 AM 05/26/2023    9:59 AM  Depression screen PHQ 2/9  Decreased Interest 0 0 0 0 0  Down, Depressed, Hopeless 0 0 0 0 0  PHQ - 2 Score 0 0 0 0 0  Altered sleeping 0 0     Tired, decreased energy 2 1     Change in appetite 0 0     Feeling bad or failure about yourself  0 0     Trouble concentrating 0 0     Moving slowly or fidgety/restless 0 0     Suicidal thoughts 0 0     PHQ-9 Score 2 1     Difficult doing  work/chores Not difficult at all Not difficult at all           11/17/2023    2:49 PM  Fall Risk   Falls in the past year? 0  Number falls in past yr: 0  Injury with Fall? 0   Risk for fall due to : No Fall Risks  Follow up Falls evaluation completed     Data saved with a previous flowsheet row definition    Patient Care Team: Sherre Clapper, MD as PCP - General (Internal Medicine) Monetta Redell PARAS, MD as PCP - Cardiology (Cardiology) Sherre Clapper, MD as Referring Physician (Internal Medicine) Monetta Redell PARAS, MD as Consulting Physician (Cardiology) Kara Dorn NOVAK, MD as Consulting Physician (Pulmonary Disease) Marda General, MD as Consulting Physician (Urology)   Review of Systems  Constitutional:  Positive for fatigue. Negative for chills and fever.  HENT:  Positive for rhinorrhea. Negative for congestion, ear pain and sore throat.   Respiratory:  Positive for cough and shortness of breath.   Cardiovascular:  Negative for chest pain.  Gastrointestinal:  Negative for abdominal pain, constipation, diarrhea, nausea and vomiting.  Endocrine: Negative for polydipsia, polyphagia and polyuria.  Genitourinary:  Negative for dysuria and frequency.  Musculoskeletal:  Positive for myalgias. Negative for arthralgias.  Neurological:  Positive for weakness. Negative for dizziness  and headaches.  Psychiatric/Behavioral:  Negative for dysphoric mood.        No dysphoria    Current Outpatient Medications on File Prior to Visit  Medication Sig Dispense Refill   amLODipine  (NORVASC ) 2.5 MG tablet Take 1 tablet (2.5 mg total) by mouth daily. for blood pressure 90 tablet 1   aspirin -acetaminophen -caffeine (EXCEDRIN MIGRAINE) 250-250-65 MG tablet Take 1 tablet by mouth as needed for headache (reports having a baseline headache).     azelastine  (ASTELIN ) 0.1 % nasal spray 1-2 sprays in each nostril twice a day as needed 30 mL 5   cloNIDine  (CATAPRES ) 0.3 MG tablet Take 1 tablet (0.3 mg total) by  mouth daily. 90 tablet 1   cyanocobalamin (VITAMIN B12) 1000 MCG tablet Take 1,000 mcg by mouth daily.     esomeprazole (NEXIUM) 10 MG packet Take 10 mg by mouth daily before breakfast.     Evolocumab  (REPATHA  SURECLICK) 140 MG/ML SOAJ Inject 140 mg into the skin every 14 (fourteen) days. 2 mL 12   ezetimibe  (ZETIA ) 10 MG tablet Take 1 tablet (10 mg total) by mouth daily. 90 tablet 1   finasteride (PROSCAR) 5 MG tablet Take 5 mg by mouth daily.     fluticasone  (FLONASE ) 50 MCG/ACT nasal spray 1-2 sprays in each nostril daily 16 g 5   furosemide  (LASIX ) 40 MG tablet Take 1 tablet (40 mg total) by mouth 2 (two) times daily. 180 tablet 3   ipratropium (ATROVENT ) 0.06 % nasal spray 1-2 sprays in each nostril up to 3 times a day as needed for runny nose/post nasal drip/drainage 15 mL 5   levocetirizine (XYZAL ) 5 MG tablet Can take one tablet once daily as needed. 90 tablet 1   magnesium oxide (MAG-OX) 400 (240 Mg) MG tablet Take 400 mg by mouth daily.     montelukast  (SINGULAIR ) 10 MG tablet Take one tablet once daily as directed. 30 tablet 5   Multiple Vitamins-Minerals (PRESERVISION AREDS 2 PO) Take 1 tablet by mouth in the morning and at bedtime.     Omega-3 Fatty Acids (FISH OIL PO) Take 1 tablet by mouth 2 (two) times daily.     potassium chloride  SA (KLOR-CON  M) 20 MEQ tablet Take 1 tablet (20 mEq total) by mouth 2 (two) times daily. 180 tablet 1   No current facility-administered medications on file prior to visit.   Past Medical History:  Diagnosis Date   Abnormal cardiac CT angiography    Atypical chest pain 01/07/2021   Calculus of left kidney 01/07/2021   Chronic idiopathic constipation 01/07/2021   Coronary artery disease    Dizziness 06/23/2023   Dyspnea on exertion 09/05/2019   Encounter for Medicare annual wellness exam 11/17/2023   Essential hypertension, benign 05/14/2018   GERD (gastroesophageal reflux disease) 09/28/2020   Hiatal hernia 09/28/2020   History of hiatal  hernia    History of kidney stones    Hyperlipidemia 05/14/2018   Hypertensive heart disease 05/14/2018   09/17/2021: SUMMARY  Severe single-vessel disease as indicated by Coronary CTA with RPL 2 having tandem 90% and 80% stenoses (CT FFR positive)-this vessel is at least the same size as the LAD  Successful DES PCI of RPL 2 covering both lesions with a Synergy DES 2.25 mm x 20 mm postdilated in tapered fashion from 2.5 to 2.3 mm.)  Otherwise minimal CAD with relatively small caliber left coronary syst   Immunization due 12/28/2022   Muscle weakness (generalized) 06/15/2023   Neuropathy 05/2023   Non-seasonal  allergic rhinitis due to pollen 08/16/2019   OSA (obstructive sleep apnea) 09/28/2020   Other chest pain 01/07/2021   Other chronic sinusitis 05/02/2022   Other fatigue 03/17/2022   Other seasonal allergic rhinitis 05/02/2022   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   Resolved with surgery   Shortness of breath 09/05/2019   Sinus bradycardia 05/14/2018   SOB (shortness of breath) 09/05/2019   Stage 3a chronic kidney disease (HCC) 12/27/2022   Syncope 09/05/2019   Thoracic ascending aortic aneurysm    ascending TAA 4.6 x 4.4 cm 10/03/20 CTA chest   Past Surgical History:  Procedure Laterality Date   COLONOSCOPY     CORONARY STENT INTERVENTION N/A 10/07/2021   Procedure: CORONARY STENT INTERVENTION;  Surgeon: Anner Alm ORN, MD;  Location[facility] INVASIVE CV LAB;  Service: Cardiovascular;  Laterality: N/A;   ESOPHAGOGASTRODUODENOSCOPY N/A 12/05/2020   Procedure: ESOPHAGOGASTRODUODENOSCOPY (EGD);  Surgeon: Shyrl Linnie KIDD, MD;  Location[facility];  Service: Thoracic;  Laterality: N/A;   LAMINECTOMY     MOLE REMOVAL     PROSTATE BIOPSY     RIGHT/LEFT HEART CATH AND CORONARY ANGIOGRAPHY N/A 10/07/2021   Procedure: RIGHT/LEFT HEART CATH AND CORONARY ANGIOGRAPHY;  Surgeon: Anner Alm ORN, MD;  Location: [facility] INVASIVE CV LAB;  Service: Cardiovascular;  Laterality: N/A;   TONSILLECTOMY AND  ADENOIDECTOMY     UPPER GASTROINTESTINAL ENDOSCOPY     XI ROBOTIC ASSISTED PARAESOPHAGEAL HERNIA REPAIR N/A 12/05/2020   Procedure: XI ROBOTIC ASSISTED PARAESOPHAGEAL HERNIA REPAIR AND FUNDOPLICATION;  Surgeon: Shyrl Linnie KIDD, MD;  Location: [facility] OR;  Service: Thoracic;  Laterality: N/A;    Family History  Problem Relation Age of Onset   Hyperlipidemia Mother    Aortic stenosis Mother    Valvular heart disease Mother    Colon cancer Mother    Diabetes Mellitus II Mother    Cancer - Colon Mother    Cancer Mother    Stroke Mother    Cerebrovascular Accident Mother    Diabetes Mother    Hypertension Mother    Hyperlipidemia Father    Diabetes Mellitus II Father    Diabetes Father    Alzheimer's disease Father    Esophageal cancer Paternal Uncle    CAD Maternal Grandmother    Congestive Heart Failure Maternal Grandmother    Heart attack Maternal Grandmother    Heart failure Maternal Grandmother    Coronary artery disease Maternal Grandmother    Hyperlipidemia Maternal Grandmother    Esophageal cancer Paternal Grandfather    Social History   Socioeconomic History   Marital status: Single    Spouse name: Not on file   Number of children: Not on file   Years of education: Not on file   Highest education level: Not on file  Occupational History   Occupation: retired marine scientist  Tobacco Use   Smoking status: Never   Smokeless tobacco: Never  Vaping Use   Vaping status: Never Used  Substance and Sexual Activity   Alcohol use: Never    Comment: occasional   Drug use: Never   Sexual activity: Not on file  Other Topics Concern   Not on file  Social History Narrative   Not on file   Social Drivers of Health   Tobacco Use: Low Risk (03/31/2024)   Patient History    Smoking Tobacco Use: Never    Smokeless Tobacco Use: Never    Passive Exposure: Not on file  Financial Resource Strain: Low Risk (11/11/2022)   Overall Physicist, Medical Strain (  CARDIA)     Difficulty of Paying Living Expenses: Not hard at all  Food Insecurity: No Food Insecurity (03/29/2024)   Epic    Worried About Programme Researcher, Broadcasting/film/video in the Last Year: Never true    Ran Out of Food in the Last Year: Never true  Transportation Needs: No Transportation Needs (03/29/2024)   Epic    Lack of Transportation (Medical): No    Lack of Transportation (Non-Medical): No  Physical Activity: Sufficiently Active (11/11/2022)   Exercise Vital Sign    Days of Exercise per Week: 5 days    Minutes of Exercise per Session: 30 min  Stress: No Stress Concern Present (11/11/2022)   [employer] of Occupational Health - Occupational Stress Questionnaire    Feeling of Stress : Not at all  Social Connections: Socially Isolated (11/11/2022)   Social Connection and Isolation Panel    Frequency of Communication with Friends and Family: More than three times a week    Frequency of Social Gatherings with Friends and Family: More than three times a week    Attends Religious Services: Never    Database Administrator or Organizations: No    Attends Banker Meetings: Never    Marital Status: Never married  Depression (PHQ2-9): Low Risk (03/29/2024)   Depression (PHQ2-9)    PHQ-2 Score: 2  Alcohol Screen: Low Risk (03/29/2024)   Alcohol Screen    Last Alcohol Screening Score (AUDIT): 0  Housing: Low Risk (03/29/2024)   Epic    Unable to Pay for Housing in the Last Year: No    Number of Times Moved in the Last Year: 0    Homeless in the Last Year: No  Utilities: Not At Risk (03/29/2024)   Epic    Threatened with loss of utilities: No  Health Literacy: Adequate Health Literacy (11/11/2022)   B1300 Health Literacy    Frequency of need for help with medical instructions: Never    Objective:  BP 130/72   Pulse (!) 59   Temp 97.9 F (36.6 C)   Ht 6' 1 (1.854 m)   Wt 191 lb (86.6 kg)   SpO2 95%   BMI 25.20 kg/m      03/29/2024   11:11 AM 03/16/2024   10:12 AM 03/09/2024     9:46 AM  BP/Weight  Systolic BP 119 130 110  Diastolic BP 79 72 64  Wt. (Lbs) 192.2 191 190.8  BMI 26.14 kg/m2 25.2 kg/m2 25.88 kg/m2    Physical Exam Vitals reviewed.  Constitutional:      Appearance: Normal appearance.  Neck:     Vascular: No carotid bruit.  Cardiovascular:     Rate and Rhythm: Normal rate and regular rhythm.     Heart sounds: Normal heart sounds.  Pulmonary:     Effort: Pulmonary effort is normal.     Breath sounds: Normal breath sounds. No wheezing, rhonchi or rales.  Abdominal:     General: Bowel sounds are normal.     Palpations: Abdomen is soft.     Tenderness: There is no abdominal tenderness.  Neurological:     Mental Status: He is alert and oriented to person, place, and time.  Psychiatric:        Mood and Affect: Mood normal.        Behavior: Behavior normal.         Lab Results  Component Value Date   WBC 7.0 03/29/2024   HGB 13.2 03/29/2024   HCT  42.0 03/29/2024   PLT 275 03/29/2024   GLUCOSE 101 (H) 03/29/2024   CHOL 110 12/09/2023   TRIG 213 (H) 12/09/2023   HDL 39 (L) 12/09/2023   LDLCALC 37 12/09/2023   ALT 38 03/29/2024   AST 44 (H) 03/29/2024   NA 139 03/29/2024   K 3.6 03/29/2024   CL 101 03/29/2024   CREATININE 1.55 (H) 03/29/2024   BUN 12 03/29/2024   CO2 24 03/29/2024   TSH 3.080 03/29/2024   INR 1.0 12/03/2020    Results for orders placed or performed in visit on 03/16/24  Protein Electrophoresis, (serum)   Collection Time: 03/16/24 10:58 AM  Result Value Ref Range   Interpretation: Comment   PE and FLC, Serum   Collection Time: 03/16/24 10:58 AM  Result Value Ref Range   Total Protein 7.2 6.0 - 8.5 g/dL   Albumin ELP 3.6 2.9 - 4.4 g/dL   Alpha 1 0.3 0.0 - 0.4 g/dL   Alpha 2 1.2 (H) 0.4 - 1.0 g/dL   Beta 1.2 0.7 - 1.3 g/dL   Gamma Globulin 0.9 0.4 - 1.8 g/dL   M-Spike, % Not Observed Not Observed g/dL   Globulin, Total 3.6 2.2 - 3.9 g/dL   A/G Ratio 1.0 0.7 - 1.7   Please Note: Comment    Ig Kappa  Free Light Chain 39.5 (H) 3.3 - 19.4 mg/L   Ig Lambda Free Light Chain 22.8 5.7 - 26.3 mg/L   KAPPA/LAMBDA RATIO 1.73 (H) 0.26 - 1.65  .  Assessment & Plan:   Assessment & Plan Abnormal SPEP Await labs/testing for recommendations.  Referred by immunology specialist to oncology.   Orders:   Protein Electrophoresis, (serum)   PE and FLC, Serum  Hypertensive heart disease without heart failure Well controlled.  No changes to medicines. Continue on clonidine  0.3 mg once daily and Norvasc  2.5 mg once daily.       Mixed hyperlipidemia Well controlled.  No changes to medicines. Fish oil 1 tablet twice daily. Zetia  10 mg daily.  Continue to work on eating a healthy diet and exercise.      SOB (shortness of breath) Unknown etiology. Thorough work up in the past  OSA (obstructive sleep apnea) - Follow up with CPAP supplier within two weeks; contact office if no communication.     Body mass index is 25.2 kg/m.    No orders of the defined types were placed in this encounter.   Orders Placed This Encounter  Procedures   Protein Electrophoresis, (serum)   PE and FLC, Serum     I,Marla I Leal-Borjas,acting as a scribe for Abigail Free, MD.,have documented all relevant documentation on the behalf of Abigail Free, MD,as directed by  Abigail Free, MD while in the presence of Abigail Free, MD.   Follow-up: Return in about 3 months (around 06/14/2024) for chronic follow up.  An After Visit Summary was printed and given to the patient.  I attest that I have reviewed this visit and agree with the plan scribed by my staff.   Abigail Free, MD [facility] [phone]

## 2024-03-18 LAB — PE AND FLC, SERUM
A/G Ratio: 1 (ref 0.7–1.7)
Albumin ELP: 3.6 g/dL (ref 2.9–4.4)
Alpha 1: 0.3 g/dL (ref 0.0–0.4)
Alpha 2: 1.2 g/dL — ABNORMAL HIGH (ref 0.4–1.0)
Beta: 1.2 g/dL (ref 0.7–1.3)
Gamma Globulin: 0.9 g/dL (ref 0.4–1.8)
Globulin, Total: 3.6 g/dL (ref 2.2–3.9)
Ig Kappa Free Light Chain: 39.5 mg/L — ABNORMAL HIGH (ref 3.3–19.4)
Ig Lambda Free Light Chain: 22.8 mg/L (ref 5.7–26.3)
KAPPA/LAMBDA RATIO: 1.73 — ABNORMAL HIGH (ref 0.26–1.65)
Total Protein: 7.2 g/dL (ref 6.0–8.5)

## 2024-03-18 LAB — PROTEIN ELECTROPHORESIS, SERUM

## 2024-03-19 DIAGNOSIS — R778 Other specified abnormalities of plasma proteins: Secondary | ICD-10-CM | POA: Insufficient documentation

## 2024-03-19 NOTE — Assessment & Plan Note (Addendum)
 Await labs/testing for recommendations.  Orders:   Protein Electrophoresis, (serum)   PE and FLC, Serum

## 2024-03-21 DIAGNOSIS — I2510 Atherosclerotic heart disease of native coronary artery without angina pectoris: Secondary | ICD-10-CM | POA: Diagnosis not present

## 2024-03-21 DIAGNOSIS — R0902 Hypoxemia: Secondary | ICD-10-CM | POA: Diagnosis not present

## 2024-03-21 DIAGNOSIS — G4733 Obstructive sleep apnea (adult) (pediatric): Secondary | ICD-10-CM | POA: Diagnosis not present

## 2024-03-22 ENCOUNTER — Ambulatory Visit: Payer: Self-pay | Admitting: Family Medicine

## 2024-03-22 NOTE — Assessment & Plan Note (Signed)
 Unknown etiology. Thorough work up in the past

## 2024-03-22 NOTE — Assessment & Plan Note (Signed)
 SABRA

## 2024-03-22 NOTE — Assessment & Plan Note (Addendum)
 Well controlled.  No changes to medicines. Continue on clonidine  0.3 mg once daily and Norvasc  2.5 mg once daily.

## 2024-03-23 ENCOUNTER — Other Ambulatory Visit: Payer: Self-pay | Admitting: *Deleted

## 2024-03-23 DIAGNOSIS — R778 Other specified abnormalities of plasma proteins: Secondary | ICD-10-CM

## 2024-03-23 NOTE — Progress Notes (Signed)
 Hello,   I contacted the patient and told him the result.  We will refer to H/O.  Thanks for letting me know!

## 2024-03-23 NOTE — Progress Notes (Signed)
 Patient has been contacted with lab result.  He needs to be referred to heme/onc for abnormal SPEP.  He is aware, but can we facilitate referral?  Thanks!

## 2024-03-24 ENCOUNTER — Other Ambulatory Visit: Payer: Self-pay | Admitting: Hematology and Oncology

## 2024-03-29 ENCOUNTER — Telehealth: Payer: Self-pay | Admitting: Hematology and Oncology

## 2024-03-29 ENCOUNTER — Other Ambulatory Visit: Payer: Self-pay

## 2024-03-29 ENCOUNTER — Inpatient Hospital Stay

## 2024-03-29 ENCOUNTER — Inpatient Hospital Stay: Attending: Hematology and Oncology | Admitting: Hematology and Oncology

## 2024-03-29 ENCOUNTER — Other Ambulatory Visit: Payer: Self-pay | Admitting: Hematology and Oncology

## 2024-03-29 ENCOUNTER — Encounter: Payer: Self-pay | Admitting: Hematology and Oncology

## 2024-03-29 VITALS — BP 119/79 | HR 86 | Temp 98.0°F | Resp 18 | Ht 71.9 in | Wt 192.2 lb

## 2024-03-29 DIAGNOSIS — Z79899 Other long term (current) drug therapy: Secondary | ICD-10-CM | POA: Insufficient documentation

## 2024-03-29 DIAGNOSIS — R778 Other specified abnormalities of plasma proteins: Secondary | ICD-10-CM

## 2024-03-29 DIAGNOSIS — Z800 Family history of malignant neoplasm of digestive organs: Secondary | ICD-10-CM | POA: Diagnosis not present

## 2024-03-29 DIAGNOSIS — D508 Other iron deficiency anemias: Secondary | ICD-10-CM

## 2024-03-29 DIAGNOSIS — R0602 Shortness of breath: Secondary | ICD-10-CM | POA: Insufficient documentation

## 2024-03-29 DIAGNOSIS — D509 Iron deficiency anemia, unspecified: Secondary | ICD-10-CM | POA: Diagnosis not present

## 2024-03-29 DIAGNOSIS — D649 Anemia, unspecified: Secondary | ICD-10-CM | POA: Insufficient documentation

## 2024-03-29 LAB — CMP (CANCER CENTER ONLY)
ALT: 38 U/L (ref 0–44)
AST: 44 U/L — ABNORMAL HIGH (ref 15–41)
Albumin: 4.9 g/dL (ref 3.5–5.0)
Alkaline Phosphatase: 168 U/L — ABNORMAL HIGH (ref 38–126)
Anion gap: 15 (ref 5–15)
BUN: 12 mg/dL (ref 8–23)
CO2: 24 mmol/L (ref 22–32)
Calcium: 10.3 mg/dL (ref 8.9–10.3)
Chloride: 101 mmol/L (ref 98–111)
Creatinine: 1.55 mg/dL — ABNORMAL HIGH (ref 0.61–1.24)
GFR, Estimated: 48 mL/min — ABNORMAL LOW (ref >60)
Glucose, Bld: 101 mg/dL — ABNORMAL HIGH (ref 70–99)
Potassium: 3.6 mmol/L (ref 3.5–5.1)
Sodium: 139 mmol/L (ref 135–145)
Total Bilirubin: 0.3 mg/dL (ref 0.0–1.2)
Total Protein: 8 g/dL (ref 6.5–8.1)

## 2024-03-29 LAB — CBC WITH DIFFERENTIAL (CANCER CENTER ONLY)
Abs Immature Granulocytes: 0.02 K/uL (ref 0.00–0.07)
Basophils Absolute: 0 K/uL (ref 0.0–0.1)
Basophils Relative: 1 %
Eosinophils Absolute: 0.1 K/uL (ref 0.0–0.5)
Eosinophils Relative: 1 %
HCT: 42 % (ref 39.0–52.0)
Hemoglobin: 13.2 g/dL (ref 13.0–17.0)
Immature Granulocytes: 0 %
Lymphocytes Relative: 21 %
Lymphs Abs: 1.4 K/uL (ref 0.7–4.0)
MCH: 24.8 pg — ABNORMAL LOW (ref 26.0–34.0)
MCHC: 31.4 g/dL (ref 30.0–36.0)
MCV: 78.9 fL — ABNORMAL LOW (ref 80.0–100.0)
Monocytes Absolute: 0.8 K/uL (ref 0.1–1.0)
Monocytes Relative: 12 %
Neutro Abs: 4.6 K/uL (ref 1.7–7.7)
Neutrophils Relative %: 65 %
Platelet Count: 275 K/uL (ref 150–400)
RBC: 5.32 MIL/uL (ref 4.22–5.81)
RDW: 17.2 % — ABNORMAL HIGH (ref 11.5–15.5)
WBC Count: 7 K/uL (ref 4.0–10.5)
nRBC: 0 % (ref 0.0–0.2)

## 2024-03-29 LAB — IRON AND TIBC
Iron: 33 ug/dL — ABNORMAL LOW (ref 45–182)
Saturation Ratios: 7 % — ABNORMAL LOW (ref 17.9–39.5)
TIBC: 493 ug/dL — ABNORMAL HIGH (ref 250–450)
UIBC: 459 ug/dL

## 2024-03-29 LAB — TSH: TSH: 3.08 u[IU]/mL (ref 0.350–4.500)

## 2024-03-29 LAB — FERRITIN: Ferritin: 14 ng/mL — ABNORMAL LOW (ref 24–336)

## 2024-03-29 LAB — FOLATE: Folate: 13.6 ng/mL (ref >5.9)

## 2024-03-29 LAB — VITAMIN B12: Vitamin B-12: 1088 pg/mL — ABNORMAL HIGH (ref 180–914)

## 2024-03-29 NOTE — Telephone Encounter (Signed)
 Patient has been scheduled for follow-up visit per 03/29/2024 LOS.  Pt aware of scheduled appt details.

## 2024-03-29 NOTE — Progress Notes (Signed)
 [facility] [address] [place],  [place]  [address] [phone]  Clinic Day:  03/29/2024   Referring physician: Lorin Norris, MD  Patient Care Team: Patient Care Team: Sherre Clapper, MD as PCP - General (Internal Medicine) Monetta Redell PARAS, MD as PCP - Cardiology (Cardiology) Sherre Clapper, MD as Referring Physician (Internal Medicine) Monetta Redell PARAS, MD as Consulting Physician (Cardiology) Kara Dorn NOVAK, MD as Consulting Physician (Pulmonary Disease) Marda General, MD as Consulting Physician (Urology)   REASON FOR CONSULTATION:  Abnormal SPEP  HISTORY OF PRESENT ILLNESS:   Joe Arroyo is a 68 y.o. male with a history of increasing shortness of breath and fatigue over the last few years, who is referred in consultation by Norris Lorin, MD for assessment and management. He states this has been an ongoing concern for the last 4 years and he has been evaluated by cardiology and gastrology with no findings. He denies fever, chills, nausea and vomiting. He denies cough or chest pain. He denies issue with bowel or bladder. His appetite and weight are stable. Medical, surgical and family history as below.    REVIEW OF SYSTEMS:   Review of Systems  Constitutional:  Positive for fatigue.  HENT:  Negative.    Eyes: Negative.   Respiratory:  Positive for shortness of breath.   Cardiovascular:  Positive for chest pain.  Gastrointestinal: Negative.   Endocrine: Negative.   Genitourinary: Negative.    Musculoskeletal: Negative.   Skin: Negative.   Neurological: Negative.   Hematological: Negative.   Psychiatric/Behavioral: Negative.       VITALS:   Blood pressure 119/79, pulse 86, temperature 98 F (36.7 C), temperature source Oral, resp. rate 18, height 5' 11.9 (1.826 m), weight 192 lb 3.2 oz (87.2 kg), SpO2 100%.  Wt Readings from Last 3 Encounters:  03/29/24 192 lb 3.2 oz (87.2 kg)  03/16/24 191 lb (86.6 kg)  03/09/24 190 lb 12.8 oz (86.5  kg)    Body mass index is 26.14 kg/m.  Performance status (ECOG): 1 - Symptomatic but completely ambulatory  PHYSICAL EXAM:   Physical Exam Constitutional:      Appearance: Normal appearance. He is normal weight.  HENT:     Head: Normocephalic and atraumatic.     Mouth/Throat:     Mouth: Mucous membranes are moist.  Cardiovascular:     Rate and Rhythm: Normal rate and regular rhythm.     Pulses: Normal pulses.     Heart sounds: Normal heart sounds.  Pulmonary:     Effort: Pulmonary effort is normal.     Breath sounds: Normal breath sounds.  Abdominal:     General: Bowel sounds are normal.     Palpations: Abdomen is soft.  Musculoskeletal:        General: Normal range of motion.  Skin:    General: Skin is warm and dry.  Neurological:     General: No focal deficit present.     Mental Status: He is alert and oriented to person, place, and time. Mental status is at baseline.  Psychiatric:        Mood and Affect: Mood normal.        Behavior: Behavior normal.        Thought Content: Thought content normal.        Judgment: Judgment normal.      LABS:      Latest Ref Rng & Units 03/29/2024   10:47 AM 12/09/2023   11:09 AM 08/27/2023  9:46 AM  CBC  WBC 4.0 - 10.5 K/uL 7.0  9.2  8.6   Hemoglobin 13.0 - 17.0 g/dL 86.7  86.7  86.7   Hematocrit 39.0 - 52.0 % 42.0  42.1  43.2   Platelets 150 - 400 K/uL 275  270  261       Latest Ref Rng & Units 03/29/2024   10:47 AM 03/16/2024   10:58 AM 02/03/2024    9:36 AM  CMP  Glucose 70 - 99 mg/dL 898   88   BUN 8 - 23 mg/dL 12   9   Creatinine 9.38 - 1.24 mg/dL 8.44   8.42   Sodium 864 - 145 mmol/L 139   139   Potassium 3.5 - 5.1 mmol/L 3.6   4.5   Chloride 98 - 111 mmol/L 101   102   CO2 22 - 32 mmol/L 24   21   Calcium  8.9 - 10.3 mg/dL 89.6   89.8   Total Protein 6.5 - 8.1 g/dL 8.0  7.2  7.3   Total Bilirubin 0.0 - 1.2 mg/dL 0.3   0.4   Alkaline Phos 38 - 126 U/L 168   110   AST 15 - 41 U/L 44   21   ALT 0 - 44 U/L  38   13      No results found for: CEA1, CEA / No results found for: CEA1, CEA No results found for: PSA1 No results found for: CAN199 No results found for: RJW874  Lab Results  Component Value Date   ALBUMINELP 3.6 03/16/2024   MSPIKE Not Observed 03/16/2024   No results found for: TIBC, FERRITIN, IRONPCTSAT No results found for: LDH  STUDIES:   CT Chest Wo Contrast Result Date: 03/02/2024 CLINICAL DATA:  History of aortic aneurysm. EXAM: CT CHEST WITHOUT CONTRAST TECHNIQUE: Multidetector CT imaging of the chest was performed following the standard protocol without IV contrast. RADIATION DOSE REDUCTION: This exam was performed according to the departmental dose-optimization program which includes automated exposure control, adjustment of the mA and/or kV according to patient size and/or use of iterative reconstruction technique. COMPARISON:  CT chest 08/21/2023. FINDINGS: Cardiovascular: Again seen is an ascending aortic aneurysm measuring 4.5 cm AP on image 8, series 60 2 x 4.3 cm transverse on image 107, series 601. AP measurement on the prior exam was repeated on today's study. Heart size is normal. No pericardial effusion. Calcific aortic and coronary atherosclerosis noted. Mediastinum/Nodes: Negative for lymphadenopathy. Hiatal hernia is unchanged. Lungs/Pleura: No pleural effusion. No nodule, mass or consolidative process. Scattered areas of mild linear scarring are unchanged. Upper Abdomen: Negative. Musculoskeletal: No acute abnormality or focal lesion. IMPRESSION: 1. No change in the patient's ascending aortic aneurysm measuring 4.5 x 4.3 cm. Recommend annual imaging followup by CTA or MRA. This recommendation follows 2010 ACCF/AHA/AATS/ACR/ASA/SCA/SCAI/SIR/STS/SVM Guidelines for the Diagnosis and Management of Patients with Thoracic Aortic Disease. Circulation. 2010; 121: [id number] 2. Calcific coronary artery disease. 3. Hiatal hernia. Aortic Atherosclerosis  (ICD10-I70.0). Electronically Signed   By: Debby Prader M.D.   On: 03/02/2024 10:14      HISTORY:   Past Medical History:  Diagnosis Date   Abnormal cardiac CT angiography    Atypical chest pain 01/07/2021   Calculus of left kidney 01/07/2021   Chronic idiopathic constipation 01/07/2021   Coronary artery disease    Dizziness 06/23/2023   Dyspnea on exertion 09/05/2019   Encounter for Medicare annual wellness exam 11/17/2023   Essential hypertension, benign 05/14/2018  GERD (gastroesophageal reflux disease) 09/28/2020   Hiatal hernia 09/28/2020   History of hiatal hernia    History of kidney stones    Hyperlipidemia 05/14/2018   Hypertensive heart disease 05/14/2018   09/17/2021: SUMMARY  Severe single-vessel disease as indicated by Coronary CTA with RPL 2 having tandem 90% and 80% stenoses (CT FFR positive)-this vessel is at least the same size as the LAD  Successful DES PCI of RPL 2 covering both lesions with a Synergy DES 2.25 mm x 20 mm postdilated in tapered fashion from 2.5 to 2.3 mm.)  Otherwise minimal CAD with relatively small caliber left coronary syst   Immunization due 12/28/2022   Muscle weakness (generalized) 06/15/2023   Neuropathy 05/2023   Non-seasonal allergic rhinitis due to pollen 08/16/2019   OSA (obstructive sleep apnea) 09/28/2020   Other chest pain 01/07/2021   Other chronic sinusitis 05/02/2022   Other fatigue 03/17/2022   Other seasonal allergic rhinitis 05/02/2022   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   Resolved with surgery   Shortness of breath 09/05/2019   Sinus bradycardia 05/14/2018   SOB (shortness of breath) 09/05/2019   Stage 3a chronic kidney disease (HCC) 12/27/2022   Syncope 09/05/2019   Thoracic ascending aortic aneurysm    ascending TAA 4.6 x 4.4 cm 10/03/20 CTA chest    Past Surgical History:  Procedure Laterality Date   COLONOSCOPY     CORONARY STENT INTERVENTION N/A 10/07/2021   Procedure: CORONARY STENT INTERVENTION;  Surgeon:  Anner Alm ORN, MD;  Location: [facility] INVASIVE CV LAB;  Service: Cardiovascular;  Laterality: N/A;   ESOPHAGOGASTRODUODENOSCOPY N/A 12/05/2020   Procedure: ESOPHAGOGASTRODUODENOSCOPY (EGD);  Surgeon: Shyrl Linnie KIDD, MD;  Location: [facility] OR;  Service: Thoracic;  Laterality: N/A;   LAMINECTOMY     MOLE REMOVAL     PROSTATE BIOPSY     RIGHT/LEFT HEART CATH AND CORONARY ANGIOGRAPHY N/A 10/07/2021   Procedure: RIGHT/LEFT HEART CATH AND CORONARY ANGIOGRAPHY;  Surgeon: Anner Alm ORN, MD;  Location: [facility] INVASIVE CV LAB;  Service: Cardiovascular;  Laterality: N/A;   TONSILLECTOMY AND ADENOIDECTOMY     UPPER GASTROINTESTINAL ENDOSCOPY     XI ROBOTIC ASSISTED PARAESOPHAGEAL HERNIA REPAIR N/A 12/05/2020   Procedure: XI ROBOTIC ASSISTED PARAESOPHAGEAL HERNIA REPAIR AND FUNDOPLICATION;  Surgeon: Shyrl Linnie KIDD, MD;  Location: [facility] OR;  Service: Thoracic;  Laterality: N/A;    Family History  Problem Relation Age of Onset   Hyperlipidemia Mother    Aortic stenosis Mother    Valvular heart disease Mother    Colon cancer Mother    Diabetes Mellitus II Mother    Cancer - Colon Mother    Cancer Mother    Stroke Mother    Cerebrovascular Accident Mother    Diabetes Mother    Hypertension Mother    Hyperlipidemia Father    Diabetes Mellitus II Father    Diabetes Father    Alzheimer's disease Father    Esophageal cancer Paternal Uncle    CAD Maternal Grandmother    Congestive Heart Failure Maternal Grandmother    Heart attack Maternal Grandmother    Heart failure Maternal Grandmother    Coronary artery disease Maternal Grandmother    Hyperlipidemia Maternal Grandmother    Esophageal cancer Paternal Grandfather     Social History:  reports that he has never smoked. He has never used smokeless tobacco. He reports that he does not drink alcohol and does not use drugs.The patient is alone  today.  Allergies: Allergies[1]  Current Medications: Current Outpatient Medications  Medication Sig  Dispense Refill   amLODipine  (NORVASC ) 2.5 MG tablet Take 1 tablet (2.5 mg total) by mouth daily. for blood pressure 90 tablet 1   aspirin -acetaminophen -caffeine (EXCEDRIN MIGRAINE) 250-250-65 MG tablet Take 1 tablet by mouth as needed for headache (reports having a baseline headache).     azelastine  (ASTELIN ) 0.1 % nasal spray 1-2 sprays in each nostril twice a day as needed 30 mL 5   cloNIDine  (CATAPRES ) 0.3 MG tablet Take 1 tablet (0.3 mg total) by mouth daily. 90 tablet 1   cyanocobalamin (VITAMIN B12) 1000 MCG tablet Take 1,000 mcg by mouth daily.     esomeprazole (NEXIUM) 10 MG packet Take 10 mg by mouth daily before breakfast.     Evolocumab  (REPATHA  SURECLICK) 140 MG/ML SOAJ Inject 140 mg into the skin every 14 (fourteen) days. 2 mL 12   ezetimibe  (ZETIA ) 10 MG tablet Take 1 tablet (10 mg total) by mouth daily. 90 tablet 1   finasteride (PROSCAR) 5 MG tablet Take 5 mg by mouth daily.     fluticasone  (FLONASE ) 50 MCG/ACT nasal spray 1-2 sprays in each nostril daily 16 g 5   furosemide  (LASIX ) 40 MG tablet Take 1 tablet (40 mg total) by mouth 2 (two) times daily. 180 tablet 3   ipratropium (ATROVENT ) 0.06 % nasal spray 1-2 sprays in each nostril up to 3 times a day as needed for runny nose/post nasal drip/drainage 15 mL 5   levocetirizine (XYZAL ) 5 MG tablet Can take one tablet once daily as needed. 90 tablet 1   magnesium oxide (MAG-OX) 400 (240 Mg) MG tablet Take 400 mg by mouth daily.     montelukast  (SINGULAIR ) 10 MG tablet Take one tablet once daily as directed. 30 tablet 5   Multiple Vitamins-Minerals (PRESERVISION AREDS 2 PO) Take 1 tablet by mouth in the morning and at bedtime.     Omega-3 Fatty Acids (FISH OIL PO) Take 1 tablet by mouth 2 (two) times daily.     potassium chloride  SA (KLOR-CON  M) 20 MEQ tablet Take 1 tablet (20 mEq total) by mouth 2 (two) times daily. 180 tablet 1   No current facility-administered medications for this visit.     ASSESSMENT & PLAN:    Assessment:  Joe Arroyo is a 68 y.o. male with ongoing history of shortness of breath for the last 4 years. He has been evaluated by cardiology and gastroenterology with benign findings. He states all testing he has undergone has been negative. He is concerned that this is all related to the COVID vaccine as he narrows that down to when the symptoms began. We discussed evaluating his iron studies as well as nutritional deficiencies. We will also repeat a SPEP. He will return to clinic in 2 weeks for review of pending labs.   Plan: 1.  Return to clinic in 2 weeks.   I discussed the assessment and treatment plan with the patient.  The patient was provided an opportunity to ask questions and all were answered.  The patient agreed with the plan and demonstrated an understanding of the instructions.    Thank you for the referral    45 minutes was spent in patient care.  This included time spent preparing to see the patient (e.g., review of tests), obtaining and/or reviewing separately obtained history, counseling and educating the patient/family/caregiver, ordering medications, tests, or procedures; documenting clinical information in the electronic or other health record, independently interpreting results and communicating results to the patient/family/caregiver as well as coordination  of care.      Eleanor DELENA Bach, NP   Family Nurse Practitioner - Board Certified [facility] [phone]        [1]  Allergies Allergen Reactions   Nitroglycerin  Other (See Comments)    Severe hypotension, caused 100 point drop in systolic BP   Beta Adrenergic Blockers Other (See Comments)    bradycardia   Bystolic [Nebivolol Hcl] Other (See Comments)    bradycardia   Carvedilol Other (See Comments)    bradycardia   Pravastatin Other (See Comments)    made my joints hurt   Tamsulosin Other (See Comments)    Syncope   Hctz [Hydrochlorothiazide] Rash   Hydralazine  Rash   Penicillins Rash

## 2024-03-30 ENCOUNTER — Telehealth: Payer: Self-pay | Admitting: Hematology and Oncology

## 2024-03-30 LAB — KAPPA/LAMBDA LIGHT CHAINS
Kappa free light chain: 34 mg/L — ABNORMAL HIGH (ref 3.3–19.4)
Kappa, lambda light chain ratio: 1.67 — ABNORMAL HIGH (ref 0.26–1.65)
Lambda free light chains: 20.3 mg/L (ref 5.7–26.3)

## 2024-03-30 NOTE — Telephone Encounter (Signed)
-----   Message from Eleanor Bach, NP sent at 03/29/2024  4:28 PM EST ----- We can push that out. ----- Message ----- From: Clement Renshaw Sent: 03/29/2024   4:20 PM EST To: Eleanor DELENA Bach, NP; Arland Idol; Denise#  Should we keep the fu on 12/30 as well or push that out? ----- Message ----- From: Bach Eleanor DELENA, NP Sent: 03/29/2024   4:05 PM EST To: Arland Idol; Renshaw Clement; Denise Bull#  Will need scheduled for Venofer. Patient is aware.

## 2024-03-30 NOTE — Telephone Encounter (Signed)
 Patient has been scheduled. Aware of appt date and time.

## 2024-03-31 ENCOUNTER — Encounter: Payer: Self-pay | Admitting: Family Medicine

## 2024-03-31 NOTE — Assessment & Plan Note (Signed)
-   Follow up with CPAP supplier within two weeks; contact office if no communication.

## 2024-04-01 LAB — MULTIPLE MYELOMA PANEL, SERUM
Albumin SerPl Elph-Mcnc: 3.9 g/dL (ref 2.9–4.4)
Albumin/Glob SerPl: 1.1 (ref 0.7–1.7)
Alpha 1: 0.3 g/dL (ref 0.0–0.4)
Alpha2 Glob SerPl Elph-Mcnc: 1.1 g/dL — ABNORMAL HIGH (ref 0.4–1.0)
B-Globulin SerPl Elph-Mcnc: 1.3 g/dL (ref 0.7–1.3)
Gamma Glob SerPl Elph-Mcnc: 1 g/dL (ref 0.4–1.8)
Globulin, Total: 3.6 g/dL (ref 2.2–3.9)
IgA: 123 mg/dL (ref 61–437)
IgG (Immunoglobin G), Serum: 827 mg/dL (ref 603–1613)
IgM (Immunoglobulin M), Srm: 190 mg/dL — ABNORMAL HIGH (ref 20–172)
Total Protein ELP: 7.5 g/dL (ref 6.0–8.5)

## 2024-04-04 ENCOUNTER — Inpatient Hospital Stay

## 2024-04-04 VITALS — BP 130/85 | HR 76 | Temp 97.7°F | Resp 18 | Ht 71.9 in

## 2024-04-04 DIAGNOSIS — D508 Other iron deficiency anemias: Secondary | ICD-10-CM

## 2024-04-04 DIAGNOSIS — R778 Other specified abnormalities of plasma proteins: Secondary | ICD-10-CM | POA: Diagnosis not present

## 2024-04-04 MED ORDER — ACETAMINOPHEN 325 MG PO TABS
650.0000 mg | ORAL_TABLET | Freq: Once | ORAL | Status: AC
  Administered 2024-04-04: 650 mg via ORAL
  Filled 2024-04-04: qty 2

## 2024-04-04 MED ORDER — IRON SUCROSE 20 MG/ML IV SOLN
200.0000 mg | Freq: Once | INTRAVENOUS | Status: AC
  Administered 2024-04-04: 200 mg via INTRAVENOUS
  Filled 2024-04-04: qty 10

## 2024-04-04 MED ORDER — SODIUM CHLORIDE 0.9 % IV SOLN: INTRAVENOUS | Status: DC

## 2024-04-04 MED ORDER — LORATADINE 10 MG PO TABS
10.0000 mg | ORAL_TABLET | Freq: Once | ORAL | Status: AC
  Administered 2024-04-04: 10 mg via ORAL
  Filled 2024-04-04: qty 1

## 2024-04-04 NOTE — Patient Instructions (Signed)

## 2024-04-05 ENCOUNTER — Inpatient Hospital Stay

## 2024-04-05 VITALS — BP 160/93 | HR 68 | Temp 98.3°F | Resp 20

## 2024-04-05 DIAGNOSIS — R778 Other specified abnormalities of plasma proteins: Secondary | ICD-10-CM | POA: Diagnosis not present

## 2024-04-05 DIAGNOSIS — D508 Other iron deficiency anemias: Secondary | ICD-10-CM

## 2024-04-05 MED ORDER — LORATADINE 10 MG PO TABS
10.0000 mg | ORAL_TABLET | Freq: Once | ORAL | Status: AC
  Administered 2024-04-05: 10 mg via ORAL
  Filled 2024-04-05: qty 1

## 2024-04-05 MED ORDER — IRON SUCROSE 20 MG/ML IV SOLN
200.0000 mg | Freq: Once | INTRAVENOUS | Status: AC
  Administered 2024-04-05: 200 mg via INTRAVENOUS
  Filled 2024-04-05: qty 10

## 2024-04-05 NOTE — Patient Instructions (Signed)

## 2024-04-06 ENCOUNTER — Other Ambulatory Visit: Payer: Self-pay | Admitting: Family Medicine

## 2024-04-06 MED ORDER — AMLODIPINE BESYLATE 2.5 MG PO TABS: 2.5000 mg | ORAL_TABLET | Freq: Every day | ORAL | 1 refills | Status: AC

## 2024-04-06 NOTE — Telephone Encounter (Signed)
 Copied from CRM #[id number]. Topic: Clinical - Medication Refill >> Apr 06, 2024  9:12 AM Everette C wrote: Medication: amLODipine  (NORVASC ) 2.5 MG tablet [[id number]]  Has the patient contacted their pharmacy? Yes (Agent: If no, request that the patient contact the pharmacy for the refill. If patient does not wish to contact the pharmacy document the reason why and proceed with request.) (Agent: If yes, when and what did the pharmacy advise?)  This is the patient's preferred pharmacy:  [facility] [place] [place] - [address] [place] [address] Phone: [phone] Fax: [phone]  Is this the correct pharmacy for this prescription? Yes If no, delete pharmacy and type the correct one.   Has the prescription been filled recently? Yes  Is the patient out of the medication? Yes  Has the patient been seen for an appointment in the last year OR does the patient have an upcoming appointment? Yes  Can we respond through MyChart? No  Agent: Please be advised that Rx refills may take up to 3 business days. We ask that you follow-up with your pharmacy.

## 2024-04-06 NOTE — Telephone Encounter (Signed)
 Sent prescription.  Copied from CRM #[id number]. Topic: Clinical - Prescription Issue >> Apr 06, 2024  9:17 AM Joe Arroyo wrote: Reason for CRM: patient is calling because he is out of his amlodipine  and is requesting to have it sent in today. Refill request has been sent.

## 2024-04-11 ENCOUNTER — Inpatient Hospital Stay

## 2024-04-11 VITALS — BP 135/77 | HR 68 | Temp 98.1°F | Resp 18

## 2024-04-11 DIAGNOSIS — R778 Other specified abnormalities of plasma proteins: Secondary | ICD-10-CM | POA: Diagnosis not present

## 2024-04-11 DIAGNOSIS — D508 Other iron deficiency anemias: Secondary | ICD-10-CM

## 2024-04-11 MED ORDER — LORATADINE 10 MG PO TABS: 10.0000 mg | ORAL_TABLET | Freq: Once | ORAL | Status: DC

## 2024-04-11 MED ORDER — SODIUM CHLORIDE 0.9% FLUSH: 10.0000 mL | Freq: Once | INTRAVENOUS | Status: DC | PRN

## 2024-04-11 MED ORDER — IRON SUCROSE 20 MG/ML IV SOLN
200.0000 mg | Freq: Once | INTRAVENOUS | Status: AC
  Administered 2024-04-11: 200 mg via INTRAVENOUS
  Filled 2024-04-11: qty 10

## 2024-04-11 MED ORDER — HEPARIN SOD (PORK) LOCK FLUSH 100 UNIT/ML IV SOLN: 250.0000 [IU] | Freq: Once | INTRAVENOUS | Status: DC | PRN

## 2024-04-11 MED ORDER — SODIUM CHLORIDE 0.9% FLUSH: 3.0000 mL | Freq: Once | INTRAVENOUS | Status: DC | PRN

## 2024-04-11 MED ORDER — ALTEPLASE 2 MG IJ SOLR: 2.0000 mg | Freq: Once | INTRAMUSCULAR | Status: DC | PRN

## 2024-04-11 MED ORDER — HEPARIN SOD (PORK) LOCK FLUSH 100 UNIT/ML IV SOLN: 500.0000 [IU] | Freq: Once | INTRAVENOUS | Status: DC | PRN

## 2024-04-12 ENCOUNTER — Inpatient Hospital Stay

## 2024-04-12 ENCOUNTER — Inpatient Hospital Stay: Admitting: Hematology and Oncology

## 2024-04-12 VITALS — BP 123/83 | HR 57 | Temp 98.1°F | Resp 18

## 2024-04-12 DIAGNOSIS — R778 Other specified abnormalities of plasma proteins: Secondary | ICD-10-CM | POA: Diagnosis not present

## 2024-04-12 DIAGNOSIS — D508 Other iron deficiency anemias: Secondary | ICD-10-CM

## 2024-04-12 MED ORDER — LORATADINE 10 MG PO TABS: 10.0000 mg | ORAL_TABLET | Freq: Once | ORAL | Status: DC

## 2024-04-12 MED ORDER — IRON SUCROSE 20 MG/ML IV SOLN
200.0000 mg | Freq: Once | INTRAVENOUS | Status: AC
  Administered 2024-04-12: 200 mg via INTRAVENOUS
  Filled 2024-04-12: qty 10

## 2024-04-12 NOTE — Patient Instructions (Signed)

## 2024-04-15 ENCOUNTER — Inpatient Hospital Stay: Attending: Hematology and Oncology

## 2024-04-15 ENCOUNTER — Other Ambulatory Visit: Payer: Self-pay | Admitting: *Deleted

## 2024-04-15 VITALS — BP 124/81 | HR 90 | Temp 98.0°F | Resp 18

## 2024-04-15 DIAGNOSIS — D508 Other iron deficiency anemias: Secondary | ICD-10-CM

## 2024-04-15 DIAGNOSIS — R778 Other specified abnormalities of plasma proteins: Secondary | ICD-10-CM | POA: Diagnosis present

## 2024-04-15 MED ORDER — IRON SUCROSE 20 MG/ML IV SOLN
200.0000 mg | Freq: Once | INTRAVENOUS | Status: AC
  Administered 2024-04-15: 200 mg via INTRAVENOUS
  Filled 2024-04-15: qty 10

## 2024-04-15 MED ORDER — LEVOCETIRIZINE DIHYDROCHLORIDE 5 MG PO TABS: ORAL_TABLET | ORAL | 1 refills | Status: AC

## 2024-04-15 NOTE — Patient Instructions (Signed)

## 2024-05-12 ENCOUNTER — Other Ambulatory Visit: Payer: Self-pay | Admitting: Hematology and Oncology

## 2024-05-12 ENCOUNTER — Inpatient Hospital Stay

## 2024-05-12 ENCOUNTER — Inpatient Hospital Stay: Admitting: Hematology and Oncology

## 2024-05-12 DIAGNOSIS — D508 Other iron deficiency anemias: Secondary | ICD-10-CM

## 2024-05-19 ENCOUNTER — Inpatient Hospital Stay: Attending: Hematology and Oncology

## 2024-05-19 ENCOUNTER — Inpatient Hospital Stay: Admitting: Hematology and Oncology

## 2024-05-19 ENCOUNTER — Telehealth: Payer: Self-pay | Admitting: Hematology and Oncology

## 2024-05-19 VITALS — BP 139/75 | HR 59 | Temp 97.9°F | Resp 18 | Ht 71.9 in | Wt 191.7 lb

## 2024-05-19 DIAGNOSIS — R778 Other specified abnormalities of plasma proteins: Secondary | ICD-10-CM

## 2024-05-19 DIAGNOSIS — D508 Other iron deficiency anemias: Secondary | ICD-10-CM

## 2024-05-19 LAB — CBC WITH DIFFERENTIAL (CANCER CENTER ONLY)
Abs Immature Granulocytes: 0.02 10*3/uL (ref 0.00–0.07)
Basophils Absolute: 0 10*3/uL (ref 0.0–0.1)
Basophils Relative: 1 %
Eosinophils Absolute: 0.2 10*3/uL (ref 0.0–0.5)
Eosinophils Relative: 3 %
HCT: 46.9 % (ref 39.0–52.0)
Hemoglobin: 15.3 g/dL (ref 13.0–17.0)
Immature Granulocytes: 0 %
Lymphocytes Relative: 37 %
Lymphs Abs: 2.1 10*3/uL (ref 0.7–4.0)
MCH: 27.3 pg (ref 26.0–34.0)
MCHC: 32.6 g/dL (ref 30.0–36.0)
MCV: 83.8 fL (ref 80.0–100.0)
Monocytes Absolute: 0.7 10*3/uL (ref 0.1–1.0)
Monocytes Relative: 13 %
Neutro Abs: 2.6 10*3/uL (ref 1.7–7.7)
Neutrophils Relative %: 46 %
Platelet Count: 233 10*3/uL (ref 150–400)
RBC: 5.6 MIL/uL (ref 4.22–5.81)
RDW: 22.7 % — ABNORMAL HIGH (ref 11.5–15.5)
WBC Count: 5.7 10*3/uL (ref 4.0–10.5)
nRBC: 0 % (ref 0.0–0.2)

## 2024-05-19 LAB — IRON AND TIBC
Iron: 95 ug/dL (ref 45–182)
Saturation Ratios: 23 % (ref 17.9–39.5)
TIBC: 413 ug/dL (ref 250–450)
UIBC: 318 ug/dL

## 2024-05-19 LAB — LIPID PANEL
Cholesterol: 263 mg/dL — ABNORMAL HIGH (ref 0–200)
HDL: 45 mg/dL
LDL Cholesterol: 166 mg/dL — ABNORMAL HIGH (ref 0–99)
Total CHOL/HDL Ratio: 5.9 ratio
Triglycerides: 264 mg/dL — ABNORMAL HIGH
VLDL: 53 mg/dL — ABNORMAL HIGH (ref 0–40)

## 2024-05-19 LAB — CMP (CANCER CENTER ONLY)
ALT: 33 U/L (ref 0–44)
AST: 37 U/L (ref 15–41)
Albumin: 4.7 g/dL (ref 3.5–5.0)
Alkaline Phosphatase: 158 U/L — ABNORMAL HIGH (ref 38–126)
Anion gap: 13 (ref 5–15)
BUN: 11 mg/dL (ref 8–23)
CO2: 25 mmol/L (ref 22–32)
Calcium: 10 mg/dL (ref 8.9–10.3)
Chloride: 99 mmol/L (ref 98–111)
Creatinine: 1.51 mg/dL — ABNORMAL HIGH (ref 0.61–1.24)
GFR, Estimated: 49 mL/min — ABNORMAL LOW
Glucose, Bld: 90 mg/dL (ref 70–99)
Potassium: 3.9 mmol/L (ref 3.5–5.1)
Sodium: 137 mmol/L (ref 135–145)
Total Bilirubin: 0.5 mg/dL (ref 0.0–1.2)
Total Protein: 7.6 g/dL (ref 6.5–8.1)

## 2024-05-19 LAB — FERRITIN: Ferritin: 121 ng/mL (ref 24–336)

## 2024-05-19 LAB — VITAMIN B12: Vitamin B-12: 1028 pg/mL — ABNORMAL HIGH (ref 180–914)

## 2024-05-19 LAB — TSH: TSH: 4.22 u[IU]/mL (ref 0.350–4.500)

## 2024-05-19 LAB — FOLATE: Folate: 9.8 ng/mL

## 2024-05-19 NOTE — Telephone Encounter (Signed)
 Scheduled appointments per 2/5 los. Talked with the patient and he is aware of the made appointments.

## 2024-05-19 NOTE — Progress Notes (Unsigned)
 " [facility] [address] [place],  [place]  [address] [phone]  Clinic Day:  05/19/2024   Referring physician: Sherre Clapper, MD  Patient Care Team: Patient Care Team: Sherre Clapper, MD as PCP - General (Internal Medicine) Monetta Redell PARAS, MD as PCP - Cardiology (Cardiology) Sherre Clapper, MD as Referring Physician (Internal Medicine) Monetta Redell PARAS, MD as Consulting Physician (Cardiology) Kara Dorn NOVAK, MD as Consulting Physician (Pulmonary Disease) Marda General, MD as Consulting Physician (Urology)   REASON FOR CONSULTATION:  Abnormal SPEP  HISTORY OF PRESENT ILLNESS:   Joe Arroyo is a 69 y.o. male with a history of increasing shortness of breath and fatigue over the last few years, who is referred in consultation by Hargis Springer, MD for assessment and management. He states this has been an ongoing concern for the last 4 years and he has been evaluated by cardiology and gastrology with no findings. He denies fever, chills, nausea and vomiting. He denies cough or chest pain. He denies issue with bowel or bladder. His appetite and weight are stable. Medical, surgical and family history as below.    REVIEW OF SYSTEMS:   Review of Systems  Constitutional:  Positive for fatigue.  HENT:  Negative.    Eyes: Negative.   Respiratory:  Positive for shortness of breath.   Cardiovascular:  Positive for chest pain.  Gastrointestinal: Negative.   Endocrine: Negative.   Genitourinary: Negative.    Musculoskeletal: Negative.   Skin: Negative.   Neurological: Negative.   Hematological: Negative.   Psychiatric/Behavioral: Negative.       VITALS:   There were no vitals taken for this visit.  Wt Readings from Last 3 Encounters:  03/29/24 192 lb 3.2 oz (87.2 kg)  03/16/24 191 lb (86.6 kg)  03/09/24 190 lb 12.8 oz (86.5 kg)    There is no height or weight on file to calculate BMI.  Performance status (ECOG): 1 - Symptomatic but completely  ambulatory  PHYSICAL EXAM:   Physical Exam Constitutional:      Appearance: Normal appearance. He is normal weight.  HENT:     Head: Normocephalic and atraumatic.     Mouth/Throat:     Mouth: Mucous membranes are moist.  Cardiovascular:     Rate and Rhythm: Normal rate and regular rhythm.     Pulses: Normal pulses.     Heart sounds: Normal heart sounds.  Pulmonary:     Effort: Pulmonary effort is normal.     Breath sounds: Normal breath sounds.  Abdominal:     General: Bowel sounds are normal.     Palpations: Abdomen is soft.  Musculoskeletal:        General: Normal range of motion.  Skin:    General: Skin is warm and dry.  Neurological:     General: No focal deficit present.     Mental Status: He is alert and oriented to person, place, and time. Mental status is at baseline.  Psychiatric:        Mood and Affect: Mood normal.        Behavior: Behavior normal.        Thought Content: Thought content normal.        Judgment: Judgment normal.      LABS:      Latest Ref Rng & Units 03/29/2024   10:47 AM 12/09/2023   11:09 AM 08/27/2023    9:46 AM  CBC  WBC 4.0 - 10.5 K/uL 7.0  9.2  8.6  Hemoglobin 13.0 - 17.0 g/dL 86.7  86.7  86.7   Hematocrit 39.0 - 52.0 % 42.0  42.1  43.2   Platelets 150 - 400 K/uL 275  270  261       Latest Ref Rng & Units 03/29/2024   10:47 AM 03/16/2024   10:58 AM 02/03/2024    9:36 AM  CMP  Glucose 70 - 99 mg/dL 898   88   BUN 8 - 23 mg/dL 12   9   Creatinine 9.38 - 1.24 mg/dL 8.44   8.42   Sodium 864 - 145 mmol/L 139   139   Potassium 3.5 - 5.1 mmol/L 3.6   4.5   Chloride 98 - 111 mmol/L 101   102   CO2 22 - 32 mmol/L 24   21   Calcium  8.9 - 10.3 mg/dL 89.6   89.8   Total Protein 6.5 - 8.1 g/dL 8.0  7.2  7.3   Total Bilirubin 0.0 - 1.2 mg/dL 0.3   0.4   Alkaline Phos 38 - 126 U/L 168   110   AST 15 - 41 U/L 44   21   ALT 0 - 44 U/L 38   13      No results found for: CEA1, CEA / No results found for: CEA1, CEA No  results found for: PSA1 No results found for: CAN199 No results found for: RJW874  Lab Results  Component Value Date   TOTALPROTELP 7.5 03/29/2024   ALBUMINELP 3.6 03/16/2024   MSPIKE Not Observed 03/16/2024   Lab Results  Component Value Date   TIBC 493 (H) 03/29/2024   FERRITIN 14 (L) 03/29/2024   IRONPCTSAT 7 (L) 03/29/2024   No results found for: LDH  STUDIES:   No results found.     HISTORY:   Past Medical History:  Diagnosis Date   Abnormal cardiac CT angiography    Atypical chest pain 01/07/2021   Calculus of left kidney 01/07/2021   Chronic idiopathic constipation 01/07/2021   Coronary artery disease    Dizziness 06/23/2023   Dyspnea on exertion 09/05/2019   Encounter for Medicare annual wellness exam 11/17/2023   Essential hypertension, benign 05/14/2018   GERD (gastroesophageal reflux disease) 09/28/2020   Hiatal hernia 09/28/2020   History of hiatal hernia    History of kidney stones    Hyperlipidemia 05/14/2018   Hypertensive heart disease 05/14/2018   09/17/2021: SUMMARY  Severe single-vessel disease as indicated by Coronary CTA with RPL 2 having tandem 90% and 80% stenoses (CT FFR positive)-this vessel is at least the same size as the LAD  Successful DES PCI of RPL 2 covering both lesions with a Synergy DES 2.25 mm x 20 mm postdilated in tapered fashion from 2.5 to 2.3 mm.)  Otherwise minimal CAD with relatively small caliber left coronary syst   Immunization due 12/28/2022   Muscle weakness (generalized) 06/15/2023   Neuropathy 05/2023   Non-seasonal allergic rhinitis due to pollen 08/16/2019   OSA (obstructive sleep apnea) 09/28/2020   Other chest pain 01/07/2021   Other chronic sinusitis 05/02/2022   Other fatigue 03/17/2022   Other seasonal allergic rhinitis 05/02/2022   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   Resolved with surgery   Shortness of breath 09/05/2019   Sinus bradycardia 05/14/2018   SOB (shortness of breath) 09/05/2019   Stage  3a chronic kidney disease (HCC) 12/27/2022   Syncope 09/05/2019   Thoracic ascending aortic aneurysm    ascending TAA 4.6 x 4.4 cm 10/03/20 CTA  chest    Past Surgical History:  Procedure Laterality Date   COLONOSCOPY     CORONARY STENT INTERVENTION N/A 10/07/2021   Procedure: CORONARY STENT INTERVENTION;  Surgeon: Anner Alm ORN, MD;  Location: [facility] INVASIVE CV LAB;  Service: Cardiovascular;  Laterality: N/A;   ESOPHAGOGASTRODUODENOSCOPY N/A 12/05/2020   Procedure: ESOPHAGOGASTRODUODENOSCOPY (EGD);  Surgeon: Shyrl Linnie KIDD, MD;  Location: [facility] OR;  Service: Thoracic;  Laterality: N/A;   LAMINECTOMY     MOLE REMOVAL     PROSTATE BIOPSY     RIGHT/LEFT HEART CATH AND CORONARY ANGIOGRAPHY N/A 10/07/2021   Procedure: RIGHT/LEFT HEART CATH AND CORONARY ANGIOGRAPHY;  Surgeon: Anner Alm ORN, MD;  Location: [facility] INVASIVE CV LAB;  Service: Cardiovascular;  Laterality: N/A;   TONSILLECTOMY AND ADENOIDECTOMY     UPPER GASTROINTESTINAL ENDOSCOPY     XI ROBOTIC ASSISTED PARAESOPHAGEAL HERNIA REPAIR N/A 12/05/2020   Procedure: XI ROBOTIC ASSISTED PARAESOPHAGEAL HERNIA REPAIR AND FUNDOPLICATION;  Surgeon: Shyrl Linnie KIDD, MD;  Location: [facility] OR;  Service: Thoracic;  Laterality: N/A;    Family History  Problem Relation Age of Onset   Hyperlipidemia Mother    Aortic stenosis Mother    Valvular heart disease Mother    Colon cancer Mother    Diabetes Mellitus II Mother    Cancer - Colon Mother    Cancer Mother    Stroke Mother    Cerebrovascular Accident Mother    Diabetes Mother    Hypertension Mother    Hyperlipidemia Father    Diabetes Mellitus II Father    Diabetes Father    Alzheimer's disease Father    Esophageal cancer Paternal Uncle    CAD Maternal Grandmother    Congestive Heart Failure Maternal Grandmother    Heart attack Maternal Grandmother    Heart failure Maternal Grandmother    Coronary artery disease Maternal Grandmother    Hyperlipidemia Maternal Grandmother     Esophageal cancer Paternal Grandfather     Social History:  reports that he has never smoked. He has never used smokeless tobacco. He reports that he does not drink alcohol and does not use drugs.The patient is alone  today.  Allergies: Allergies[1]  Current Medications: Current Outpatient Medications  Medication Sig Dispense Refill   amLODipine  (NORVASC ) 2.5 MG tablet Take 1 tablet (2.5 mg total) by mouth daily. for blood pressure 90 tablet 1   aspirin -acetaminophen -caffeine (EXCEDRIN MIGRAINE) 250-250-65 MG tablet Take 1 tablet by mouth as needed for headache (reports having a baseline headache).     azelastine  (ASTELIN ) 0.1 % nasal spray 1-2 sprays in each nostril twice a day as needed 30 mL 5   cloNIDine  (CATAPRES ) 0.3 MG tablet Take 1 tablet (0.3 mg total) by mouth daily. 90 tablet 1   cyanocobalamin  (VITAMIN B12) 1000 MCG tablet Take 1,000 mcg by mouth daily.     esomeprazole (NEXIUM) 10 MG packet Take 10 mg by mouth daily before breakfast.     Evolocumab  (REPATHA  SURECLICK) 140 MG/ML SOAJ Inject 140 mg into the skin every 14 (fourteen) days. 2 mL 12   ezetimibe  (ZETIA ) 10 MG tablet Take 1 tablet (10 mg total) by mouth daily. 90 tablet 1   finasteride (PROSCAR) 5 MG tablet Take 5 mg by mouth daily.     fluticasone  (FLONASE ) 50 MCG/ACT nasal spray 1-2 sprays in each nostril daily 16 g 5   furosemide  (LASIX ) 40 MG tablet Take 1 tablet (40 mg total) by mouth 2 (two) times daily. 180 tablet 3   ipratropium (ATROVENT )  0.06 % nasal spray 1-2 sprays in each nostril up to 3 times a day as needed for runny nose/post nasal drip/drainage 15 mL 5   levocetirizine (XYZAL ) 5 MG tablet Can take one tablet once daily as needed. 90 tablet 1   magnesium oxide (MAG-OX) 400 (240 Mg) MG tablet Take 400 mg by mouth daily.     montelukast  (SINGULAIR ) 10 MG tablet Take one tablet once daily as directed. 30 tablet 5   Multiple Vitamins-Minerals (PRESERVISION AREDS 2 PO) Take 1 tablet by mouth in the morning  and at bedtime.     Omega-3 Fatty Acids (FISH OIL PO) Take 1 tablet by mouth 2 (two) times daily.     potassium chloride  SA (KLOR-CON  M) 20 MEQ tablet Take 1 tablet (20 mEq total) by mouth 2 (two) times daily. 180 tablet 1   No current facility-administered medications for this visit.     ASSESSMENT & PLAN:   Assessment:  Joe Arroyo is a 69 y.o. male with ongoing history of shortness of breath for the last 4 years. He has been evaluated by cardiology and gastroenterology with benign findings. He states all testing he has undergone has been negative. He is concerned that this is all related to the COVID vaccine as he narrows that down to when the symptoms began. We discussed evaluating his iron  studies as well as nutritional deficiencies. We will also repeat a SPEP. He will return to clinic in 2 weeks for review of pending labs.   Plan: 1.  Return to clinic in 2 weeks.   I discussed the assessment and treatment plan with the patient.  The patient was provided an opportunity to ask questions and all were answered.  The patient agreed with the plan and demonstrated an understanding of the instructions.    Thank you for the referral    45 minutes was spent in patient care.  This included time spent preparing to see the patient (e.g., review of tests), obtaining and/or reviewing separately obtained history, counseling and educating the patient/family/caregiver, ordering medications, tests, or procedures; documenting clinical information in the electronic or other health record, independently interpreting results and communicating results to the patient/family/caregiver as well as coordination of care.      Eleanor DELENA Bach, NP   Family Nurse Practitioner - Board Certified [facility] [phone]         [1]  Allergies Allergen Reactions   Nitroglycerin  Other (See Comments)    Severe hypotension, caused 100 point drop in systolic BP   Beta Adrenergic  Blockers Other (See Comments)    bradycardia   Bystolic [Nebivolol Hcl] Other (See Comments)    bradycardia   Carvedilol Other (See Comments)    bradycardia   Pravastatin Other (See Comments)    made my joints hurt   Tamsulosin Other (See Comments)    Syncope   Hctz [Hydrochlorothiazide] Rash   Hydralazine Rash   Penicillins Rash   "

## 2024-06-21 ENCOUNTER — Ambulatory Visit: Admitting: Physician Assistant

## 2024-08-16 ENCOUNTER — Inpatient Hospital Stay

## 2024-08-16 ENCOUNTER — Inpatient Hospital Stay: Admitting: Hematology and Oncology
